# Patient Record
Sex: Female | Born: 1975 | Race: White | Hispanic: Yes | Marital: Married | Smoking: Former smoker
Health system: Southern US, Community
[De-identification: ages and names within clinical notes are randomized; demographics above are authoritative.]

## PROBLEM LIST (undated history)

## (undated) DIAGNOSIS — D649 Anemia, unspecified: Secondary | ICD-10-CM

## (undated) DIAGNOSIS — I10 Essential (primary) hypertension: Secondary | ICD-10-CM

## (undated) DIAGNOSIS — E119 Type 2 diabetes mellitus without complications: Secondary | ICD-10-CM

## (undated) DIAGNOSIS — D126 Benign neoplasm of colon, unspecified: Secondary | ICD-10-CM

## (undated) DIAGNOSIS — E785 Hyperlipidemia, unspecified: Secondary | ICD-10-CM

## (undated) DIAGNOSIS — D151 Benign neoplasm of heart: Secondary | ICD-10-CM

## (undated) DIAGNOSIS — I2510 Atherosclerotic heart disease of native coronary artery without angina pectoris: Secondary | ICD-10-CM

## (undated) DIAGNOSIS — Z5189 Encounter for other specified aftercare: Secondary | ICD-10-CM

## (undated) DIAGNOSIS — E876 Hypokalemia: Secondary | ICD-10-CM

## (undated) HISTORY — DX: Hyperlipidemia, unspecified: E78.5

## (undated) HISTORY — DX: Encounter for other specified aftercare: Z51.89

## (undated) HISTORY — DX: Benign neoplasm of colon, unspecified: D12.6

## (undated) HISTORY — PX: OTHER SURGICAL HISTORY: SHX169

## (undated) HISTORY — DX: Hypokalemia: E87.6

## (undated) NOTE — *Deleted (*Deleted)
Thank you for coming to see me today. It was a pleasure.     Please follow-up with *** in ***  If you have any questions or concerns, please do not hesitate to call the office at [phone].  Best,   Correen Bubolz, MD Family Medicine Residency    

---

## 2003-09-12 ENCOUNTER — Emergency Department (HOSPITAL_COMMUNITY): Admission: EM | Admit: 2003-09-12 | Discharge: 2003-09-12 | Payer: Self-pay | Admitting: *Deleted

## 2003-09-12 ENCOUNTER — Encounter: Payer: Self-pay | Admitting: *Deleted

## 2004-04-16 ENCOUNTER — Emergency Department (HOSPITAL_COMMUNITY): Admission: EM | Admit: 2004-04-16 | Discharge: 2004-04-16 | Payer: Self-pay | Admitting: Emergency Medicine

## 2004-05-09 ENCOUNTER — Emergency Department (HOSPITAL_COMMUNITY): Admission: EM | Admit: 2004-05-09 | Discharge: 2004-05-09 | Payer: Self-pay | Admitting: *Deleted

## 2005-08-29 ENCOUNTER — Ambulatory Visit: Payer: Self-pay | Admitting: *Deleted

## 2005-08-29 ENCOUNTER — Ambulatory Visit (HOSPITAL_COMMUNITY): Admission: RE | Admit: 2005-08-29 | Discharge: 2005-08-29 | Payer: Self-pay | Admitting: *Deleted

## 2005-09-05 ENCOUNTER — Ambulatory Visit: Payer: Self-pay | Admitting: *Deleted

## 2005-09-05 ENCOUNTER — Ambulatory Visit (HOSPITAL_COMMUNITY): Admission: RE | Admit: 2005-09-05 | Discharge: 2005-09-05 | Payer: Self-pay | Admitting: Obstetrics & Gynecology

## 2005-09-12 ENCOUNTER — Ambulatory Visit: Payer: Self-pay | Admitting: *Deleted

## 2005-09-19 ENCOUNTER — Ambulatory Visit: Payer: Self-pay | Admitting: *Deleted

## 2005-09-26 ENCOUNTER — Inpatient Hospital Stay (HOSPITAL_COMMUNITY): Admission: AD | Admit: 2005-09-26 | Discharge: 2005-09-29 | Payer: Self-pay | Admitting: *Deleted

## 2005-09-26 ENCOUNTER — Ambulatory Visit: Payer: Self-pay | Admitting: *Deleted

## 2005-09-26 ENCOUNTER — Ambulatory Visit: Payer: Self-pay | Admitting: Family Medicine

## 2007-11-13 ENCOUNTER — Emergency Department (HOSPITAL_COMMUNITY): Admission: EM | Admit: 2007-11-13 | Discharge: 2007-11-13 | Payer: Self-pay | Admitting: Emergency Medicine

## 2007-11-14 ENCOUNTER — Encounter: Payer: Self-pay | Admitting: Obstetrics & Gynecology

## 2007-11-14 ENCOUNTER — Other Ambulatory Visit: Payer: Self-pay | Admitting: Emergency Medicine

## 2007-11-14 ENCOUNTER — Inpatient Hospital Stay (HOSPITAL_COMMUNITY): Admission: AD | Admit: 2007-11-14 | Discharge: 2007-11-15 | Payer: Self-pay | Admitting: Obstetrics & Gynecology

## 2007-11-14 ENCOUNTER — Ambulatory Visit: Payer: Self-pay | Admitting: Obstetrics & Gynecology

## 2007-12-02 ENCOUNTER — Encounter (INDEPENDENT_AMBULATORY_CARE_PROVIDER_SITE_OTHER): Payer: Self-pay | Admitting: *Deleted

## 2009-10-08 ENCOUNTER — Inpatient Hospital Stay (HOSPITAL_COMMUNITY): Admission: AD | Admit: 2009-10-08 | Discharge: 2009-10-08 | Payer: Self-pay | Admitting: Obstetrics & Gynecology

## 2009-12-18 HISTORY — PX: TUBAL LIGATION: SHX77

## 2010-03-27 ENCOUNTER — Inpatient Hospital Stay (HOSPITAL_COMMUNITY): Admission: AD | Admit: 2010-03-27 | Discharge: 2010-03-28 | Payer: Self-pay | Admitting: Obstetrics

## 2010-04-05 ENCOUNTER — Encounter (INDEPENDENT_AMBULATORY_CARE_PROVIDER_SITE_OTHER): Payer: Self-pay | Admitting: Obstetrics

## 2010-04-05 ENCOUNTER — Inpatient Hospital Stay (HOSPITAL_COMMUNITY)
Admission: RE | Admit: 2010-04-05 | Discharge: 2010-04-08 | Payer: Self-pay | Source: Ambulatory Visit | Admitting: Obstetrics

## 2010-05-02 ENCOUNTER — Encounter: Payer: Self-pay | Admitting: *Deleted

## 2011-01-17 NOTE — Assessment & Plan Note (Signed)
Summary: New pt/DNKA               ]

## 2011-01-17 NOTE — Miscellaneous (Signed)
Summary: wanted appt  Clinical Lists Changes had a c section & has an indfected stitch. wanted to be seen here. (interpretor used) told her to call surgeon who did the C section. may use [facility] for a provider. she is not a pt here. [facility] her NP appt in 2008. have not heard from her until now.[place] RN  May 02, 2010 2:12 PM

## 2011-03-07 LAB — CBC
HCT: 32.5 % — ABNORMAL LOW (ref 36.0–46.0)
HCT: 43.6 % (ref 36.0–46.0)
Hemoglobin: 10.7 g/dL — ABNORMAL LOW (ref 12.0–15.0)
Hemoglobin: 14.3 g/dL (ref 12.0–15.0)
MCHC: 32.7 g/dL (ref 30.0–36.0)
MCHC: 33.1 g/dL (ref 30.0–36.0)
MCV: 86.8 fL (ref 78.0–100.0)
MCV: 87.1 fL (ref 78.0–100.0)
Platelets: 162 10*3/uL (ref 150–400)
Platelets: 186 10*3/uL (ref 150–400)
RBC: 3.72 MIL/uL — ABNORMAL LOW (ref 3.87–5.11)
RBC: 5.03 MIL/uL (ref 3.87–5.11)
RDW: 16.7 % — ABNORMAL HIGH (ref 11.5–15.5)
RDW: 16.8 % — ABNORMAL HIGH (ref 11.5–15.5)
WBC: 12.7 10*3/uL — ABNORMAL HIGH (ref 4.0–10.5)
WBC: 9.7 10*3/uL (ref 4.0–10.5)

## 2011-03-07 LAB — RPR: RPR Ser Ql: NONREACTIVE

## 2011-05-02 NOTE — Discharge Summary (Signed)
NAMEFreddi Diaz NO.:  [id number]   MEDICAL RECORD NO.:  [id number]          PATIENT TYPE:  INP   LOCATION:  9140                          [facility]ILITY[facility]  [facility]   PHYSICIAN:  Lesly Dukes, M.D. DATE OF BIRTH:  1975-12-21   DATE OF ADMISSION:  11/14/2007  DATE OF DISCHARGE:                               DISCHARGE SUMMARY   REASON FOR ADMISSION:  Pregnancy at 7 weeks with inevitable abortion  with hemorrhage.   DISCHARGE DIAGNOSES:  Status post dilatation and curettage for  inevitable abortion with hemorrhage.   HOSPITAL COURSE:  The patient required 2 units of blood for low  hemoglobin secondary to blood loss, responded well to that.  Blood  pressures are stable and those are as follows post transfusion:  126/78,  133/85, pulses ranged from 80-90.   PHYSICAL EXAM:  HEART:  Is regular to rhythm and rate.  LUNGS:  Are clear to auscultation bilaterally.  ABDOMEN:  Is soft, flat, tender with palpation.   ASSESSMENT:  Stable postoperative day #1, status post inevitable  abortion with hemorrhage, anemia secondary to blood loss.   PLAN:  We are going to discharge her home.  [facility]elda [facility] in 2 weeks.   DISCHARGE MEDS:  1. Cipro 500 b.i.d. for 7 days.  2. Darvocet-N 100, 1 p.o. q.4 h. p.r.n. cramping.   FOLLOWUP:  She is to notify us or Dr. Emelda Fear of any further  complications.      Zerita Boers, N.M.      Lesly Dukes, M.D.  Electronically Signed    DL/MEDQ  D:  13/07/6577  T:  11/15/2007  Job:  [id number]   cc:   [facility]

## 2011-05-02 NOTE — Op Note (Signed)
NAMEFreddi Diaz NO.:  [id number]   MEDICAL RECORD NO.:  [id number]          PATIENT TYPE:  INP   LOCATION:  9301                          [facility]   PHYSICIAN:  Norton Blizzard, MD    DATE OF BIRTH:  Sep 02, 1976   DATE OF PROCEDURE:  11/14/2007  DATE OF DISCHARGE:                               OPERATIVE REPORT   PREOPERATIVE DIAGNOSIS:  Seven week fetal demise, incomplete septic  abortion.   POSTOPERATIVE DIAGNOSIS:  Seven week fetal demise, incomplete septic  abortion.   PROCEDURE:  Suction dilatation and curettage.   SURGEON:  Johnella Moloney, M.D.   ANESTHESIA:  Spinal and paracervical block with 0.5% Marcaine.   INTRAVENOUS FLUIDS:  600 cc of LR.   URINE OUTPUT:  50 cc.   ESTIMATED BLOOD LOSS:  100 cc.   INDICATIONS:  The patient is a 35 year old, G5 P4, with newly-diagnosed  fetal demise at seven weeks gestation.  She was seen at [facility] on  November 13, 2007 for bleeding; she was diagnosed with the fetal demise  and given that patient was stable, she opted for expectant management.  The patient returned to the emergency room with heavy bleeding today  and, given the concern for incomplete abortion, she was [facility] evaluation.  Upon arrival at  [facility], the patient was noted to have severe bleeding and also  passing clots.  She was also noted to have a temperature of 100.6, blood  pressure of 120/80, and a pulse in the 90s.  The patient also was noted  to have a syncopal episode.  A pelvic exam on admission revealed  products of conception at the external os, which were removed using ring  forceps, and the specimen was sent to pathology.  After this, the  patient continued to have brisk bleeding.  The decision was made to  proceed with suction D & C, and written informed consent was obtained  with the aid of a Spanish interpreter.   FINDINGS:  Eight-week size anteverted  uterus, moderate amount of  products of conception, open cervical os to 1cm dilatation.   SPECIMENS:  Products of conception sent to pathology.   DESCRIPTION OF PROCEDURE:  The patient was taken to the operating room  where spinal anesthesia was placed without difficulty. Zosyn 4.5 g was  also administered intravenously given patient's fever.  She was placed  in the dorsal lithotomy position and prepped and draped in a sterile  manner.  A sterile speculum was placed in the patient's vagina, and the  cervix was noted to be about 1 cm dilated.  A single-toothed tenaculum  was placed on the anterior lip of the cervix and a paracervical block  was then administered using 20 cc of 0.5% Marcaine.  An 8 mm suction  curette was then advanced gently to the uterine fundus.  The suction  device was activated and the curette was rotated to clear the uterus of  all the products of conception.  A sharp curettage was then performed  until a gritty  texture was noted.  The  suction curette was then reintroduced to clear  the uterus of all remaining products of conception.  There was minimal  bleeding noted and the tenaculum was then removed with good hemostasis  noted.  The patient tolerated the procedure well.  She was taken to the  recovery room in stable condition.      Norton Blizzard, MD  Electronically Signed     UAD/MEDQ  D:  11/14/2007  T:  11/14/2007  Job:  [id number]

## 2011-06-17 ENCOUNTER — Emergency Department (HOSPITAL_COMMUNITY): Payer: Self-pay

## 2011-06-17 ENCOUNTER — Inpatient Hospital Stay (INDEPENDENT_AMBULATORY_CARE_PROVIDER_SITE_OTHER)
Admission: RE | Admit: 2011-06-17 | Discharge: 2011-06-17 | Disposition: A | Discharge status: Home / Self Care | Payer: Self-pay | Source: Ambulatory Visit | Attending: Family Medicine | Admitting: Family Medicine

## 2011-06-17 ENCOUNTER — Emergency Department (HOSPITAL_COMMUNITY)
Admission: EM | Admit: 2011-06-17 | Discharge: 2011-06-17 | Disposition: A | Discharge status: Home / Self Care | Payer: Self-pay | Attending: Emergency Medicine | Admitting: Emergency Medicine

## 2011-06-17 DIAGNOSIS — R0602 Shortness of breath: Secondary | ICD-10-CM | POA: Insufficient documentation

## 2011-06-17 DIAGNOSIS — R0609 Other forms of dyspnea: Secondary | ICD-10-CM | POA: Insufficient documentation

## 2011-06-17 DIAGNOSIS — R0989 Other specified symptoms and signs involving the circulatory and respiratory systems: Secondary | ICD-10-CM | POA: Insufficient documentation

## 2011-06-17 DIAGNOSIS — R51 Headache: Secondary | ICD-10-CM | POA: Insufficient documentation

## 2011-06-17 DIAGNOSIS — Z79899 Other long term (current) drug therapy: Secondary | ICD-10-CM | POA: Insufficient documentation

## 2011-06-17 DIAGNOSIS — R079 Chest pain, unspecified: Secondary | ICD-10-CM | POA: Insufficient documentation

## 2011-06-17 DIAGNOSIS — Z760 Encounter for issue of repeat prescription: Secondary | ICD-10-CM | POA: Insufficient documentation

## 2011-06-17 DIAGNOSIS — I10 Essential (primary) hypertension: Secondary | ICD-10-CM | POA: Insufficient documentation

## 2011-06-17 LAB — BASIC METABOLIC PANEL
BUN: 12 mg/dL (ref 6–23)
CO2: 24 mEq/L (ref 19–32)
Calcium: 8.7 mg/dL (ref 8.4–10.5)
Chloride: 105 mEq/L (ref 96–112)
Creatinine, Ser: 0.58 mg/dL (ref 0.50–1.10)
GFR calc Af Amer: >60 mL/min (ref >60)
GFR calc non Af Amer: >60 mL/min (ref >60)
Glucose, Bld: 106 mg/dL — ABNORMAL HIGH (ref 70–99)
Potassium: 3.6 mEq/L (ref 3.5–5.1)
Sodium: 138 mEq/L (ref 135–145)

## 2011-06-17 LAB — CK TOTAL AND CKMB (NOT AT ARMC)
CK, MB: 2.1 ng/mL (ref 0.3–4.0)
CK, MB: 2 ng/mL (ref 0.3–4.0)
CK, MB: 1.8 ng/mL (ref 0.3–4.0)
Relative Index: 2 (ref 0.0–2.5)
Relative Index: INVALID (ref 0.0–2.5)
Relative Index: INVALID (ref 0.0–2.5)
Total CK: 103 U/L (ref 7–177)
Total CK: 93 U/L (ref 7–177)
Total CK: 88 U/L (ref 7–177)

## 2011-06-17 LAB — URINALYSIS, ROUTINE W REFLEX MICROSCOPIC
Bilirubin Urine: NEGATIVE
Glucose, UA: NEGATIVE mg/dL
Ketones, ur: NEGATIVE mg/dL
Nitrite: NEGATIVE
Protein, ur: NEGATIVE mg/dL
Specific Gravity, Urine: 1.009 (ref 1.005–1.030)
Urobilinogen, UA: 0.2 mg/dL (ref 0.0–1.0)
pH: 7.5 (ref 5.0–8.0)

## 2011-06-17 LAB — CBC
HCT: 40.3 % (ref 36.0–46.0)
Hemoglobin: 13.4 g/dL (ref 12.0–15.0)
MCH: 27.7 pg (ref 26.0–34.0)
MCHC: 33.3 g/dL (ref 30.0–36.0)
MCV: 83.4 fL (ref 78.0–100.0)
Platelets: 225 10*3/uL (ref 150–400)
RBC: 4.83 MIL/uL (ref 3.87–5.11)
RDW: 13.9 % (ref 11.5–15.5)
WBC: 12.5 10*3/uL — ABNORMAL HIGH (ref 4.0–10.5)

## 2011-06-17 LAB — DIFFERENTIAL
Basophils Absolute: 0 10*3/uL (ref 0.0–0.1)
Basophils Relative: 0 % (ref 0–1)
Eosinophils Absolute: 0.1 10*3/uL (ref 0.0–0.7)
Eosinophils Relative: 1 % (ref 0–5)
Lymphocytes Relative: 20 % (ref 12–46)
Lymphs Abs: 2.5 10*3/uL (ref 0.7–4.0)
Monocytes Absolute: 0.6 10*3/uL (ref 0.1–1.0)
Monocytes Relative: 5 % (ref 3–12)
Neutro Abs: 9.4 10*3/uL — ABNORMAL HIGH (ref 1.7–7.7)
Neutrophils Relative %: 75 % (ref 43–77)

## 2011-06-17 LAB — URINE MICROSCOPIC-ADD ON

## 2011-06-17 LAB — TROPONIN I
Troponin I: <0.3 ng/mL (ref <0.30)
Troponin I: <0.3 ng/mL (ref <0.30)
Troponin I: <0.3 ng/mL (ref <0.30)

## 2011-06-17 LAB — POCT PREGNANCY, URINE: Preg Test, Ur: NEGATIVE

## 2011-09-26 LAB — ABO/RH
ABO/RH(D): O POS
ABO/RH(D): O POS

## 2011-09-26 LAB — DIFFERENTIAL
Basophils Absolute: 0
Basophils Absolute: 0.1
Basophils Relative: 0
Basophils Relative: 1
Eosinophils Absolute: 0 — ABNORMAL LOW
Eosinophils Absolute: 0.2
Eosinophils Relative: 0
Eosinophils Relative: 1
Lymphocytes Relative: 12
Lymphocytes Relative: 27
Lymphs Abs: 1.5
Lymphs Abs: 3.5
Monocytes Absolute: 0.4
Monocytes Absolute: 0.6
Monocytes Relative: 3
Monocytes Relative: 5
Neutro Abs: 11.2 — ABNORMAL HIGH
Neutro Abs: 8.8 — ABNORMAL HIGH
Neutrophils Relative %: 67
Neutrophils Relative %: 85 — ABNORMAL HIGH

## 2011-09-26 LAB — CBC
HCT: 24.8 — ABNORMAL LOW
HCT: 29.3 — ABNORMAL LOW
HCT: 34.1 — ABNORMAL LOW
HCT: 41.8
Hemoglobin: 10 — ABNORMAL LOW
Hemoglobin: 11.8 — ABNORMAL LOW
Hemoglobin: 14.1
Hemoglobin: 8.4 — ABNORMAL LOW
MCHC: 33.7
MCHC: 33.9
MCHC: 34.2
MCHC: 34.6
MCV: 87.1
MCV: 88.1
MCV: 88.9
MCV: 89
Platelets: 205
Platelets: 252
Platelets: 277
Platelets: 281
RBC: 2.79 — ABNORMAL LOW
RBC: 3.29 — ABNORMAL LOW
RBC: 3.87
RBC: 4.79
RDW: 13.3
RDW: 13.3
RDW: 13.5
RDW: 13.6
WBC: 11.9 — ABNORMAL HIGH
WBC: 13.1 — ABNORMAL HIGH
WBC: 13.2 — ABNORMAL HIGH
WBC: 18.9 — ABNORMAL HIGH

## 2011-09-26 LAB — TYPE AND SCREEN
ABO/RH(D): O POS
Antibody Screen: NEGATIVE

## 2011-09-26 LAB — APTT: aPTT: 34

## 2011-09-26 LAB — FIBRINOGEN: Fibrinogen: 394

## 2011-09-26 LAB — PREPARE RBC (CROSSMATCH)

## 2011-09-26 LAB — PROTIME-INR
INR: 1.1
Prothrombin Time: 13.9

## 2013-11-20 ENCOUNTER — Encounter (HOSPITAL_COMMUNITY): Payer: Self-pay | Admitting: Emergency Medicine

## 2013-11-20 ENCOUNTER — Emergency Department (INDEPENDENT_AMBULATORY_CARE_PROVIDER_SITE_OTHER): Payer: Self-pay

## 2013-11-20 ENCOUNTER — Emergency Department (INDEPENDENT_AMBULATORY_CARE_PROVIDER_SITE_OTHER)
Admission: EM | Admit: 2013-11-20 | Discharge: 2013-11-20 | Disposition: A | Discharge status: Home / Self Care | Payer: Self-pay | Source: Home / Self Care

## 2013-11-20 DIAGNOSIS — J069 Acute upper respiratory infection, unspecified: Secondary | ICD-10-CM

## 2013-11-20 HISTORY — DX: Essential (primary) hypertension: I10

## 2013-11-20 MED ORDER — PHENYLEPH-CHLORPHEN-CODEINE 5-2-10 MG/5ML PO SYRP: 2.0000 mL | ORAL_SOLUTION | Freq: Three times a day (TID) | ORAL | Status: DC | PRN

## 2013-11-20 NOTE — ED Provider Notes (Signed)
CSN: [id number]     Arrival date & time 11/20/13  2952 History   None    Chief Complaint  Patient presents with  . Cough   (Consider location/radiation/quality/duration/timing/severity/associated sxs/prior Treatment) HPI  Cough - 2 weeks, worsening, accompanied by central chest pain when coughing, mild shortness of breath, sore muscles, headache and itching in her ears; 1 sick child at home; she denies fever; pt with history of high blood pressure  Past Medical History  Diagnosis Date  . Hypertension    Past Surgical History  Procedure Laterality Date  . Cesarean section  1999 and 2011    x 2  . Tubal ligation  2011   Family History  Problem Relation Age of Onset  . Hypertension Mother   . Diabetes Father    History  Substance Use Topics  . Smoking status: Never Smoker   . Smokeless tobacco: Not on file  . Alcohol Use: Yes     Comment: occasional   OB History   Grav Para Term Preterm Abortions TAB SAB Ect Mult Living                 Review of Systems Gen: middle aged female, ill appearing but not toxic, obese HEENT: NCAT, injected conjunctiva, normal tympanic membrane, mild tenderness to palpation of sinus, no OP exudate CV: RRR, no m/r/g, no JVD or carotid bruits; reproducible chest pain on palpation of sternum  Pulm: normal WOB, CTA-B   Allergies  Review of patient's allergies indicates no known allergies.  Home Medications   Current Outpatient Rx  Name  Route  Sig  Dispense  Refill  . guaifenesin (ROBITUSSIN) 100 MG/5ML syrup   Oral   Take 200 mg by mouth 3 (three) times daily as needed for cough.         Marland Kitchen lisinopril-hydrochlorothiazide (PRINZIDE,ZESTORETIC) 20-25 MG per tablet   Oral   Take 1 tablet by mouth daily.         Marland Kitchen Phenyleph-Chlorphen-Codeine 04-18-09 MG/5ML SYRP   Oral   Take 2 mLs by mouth every 8 (eight) hours as needed (Cough).   1 Bottle   0    BP 146/96  Pulse 70  Temp(Src) 98.9 F (37.2 C) (Oral)  Resp 24  SpO2 100%   LMP 10/19/2013 Physical Exam  ED Course  Procedures (including critical care time) Labs Review Labs Reviewed - No data to display Imaging Review Dg Chest 2 View  11/20/2013   CLINICAL DATA:  Cough, chest pain  EXAM: CHEST  2 VIEW  COMPARISON:  June 17, 2011  FINDINGS: The heart size and mediastinal contours are within normal limits. There is no focal infiltrate, pulmonary edema, or pleural effusion. The visualized skeletal structures are stable. There is mild scoliosis of spine.  IMPRESSION: No active cardiopulmonary disease.   Electronically Signed   By: Sherian Rein M.D.   On: 11/20/2013 21:00    Date: 11/20/2013  Rate: 68  Rhythm: normal sinus rhythm  QRS Axis: normal  Intervals: normal  ST/T Wave abnormalities: normal  Conduction Disutrbances:none  Narrative Interpretation: no evidence of ischemia or infarct  Old EKG Reviewed: none available      MDM   1. Viral URI with cough    No concern for pneumonia and chest pain due to costochondritis. Given rx for codeine cough syrup and expectant mgt.     Garnetta Buddy, MD 11/20/13 2153

## 2013-11-20 NOTE — ED Notes (Signed)
C/o coughing x 2 weeks with pain in L upper chest and hard to breathe.  Also c/o L earache.  Cough is non-productive.  When she coughs, she has a headache and coughs more at night.

## 2013-11-21 NOTE — ED Provider Notes (Signed)
Medical screening examination/treatment/procedure(s) were performed by resident physician or non-physician practitioner and as supervising physician I was immediately available for consultation/collaboration.   Riata Ikeda DOUGLAS MD.   Cielle Aguila D Tamara Kenyon, MD 11/21/13 1707 

## 2014-07-31 ENCOUNTER — Emergency Department (HOSPITAL_COMMUNITY): Payer: Self-pay

## 2014-07-31 ENCOUNTER — Emergency Department (HOSPITAL_COMMUNITY)
Admission: EM | Admit: 2014-07-31 | Discharge: 2014-07-31 | Disposition: A | Discharge status: Home / Self Care | Payer: Self-pay | Attending: Emergency Medicine | Admitting: Emergency Medicine

## 2014-07-31 ENCOUNTER — Encounter (HOSPITAL_COMMUNITY): Payer: Self-pay | Admitting: Emergency Medicine

## 2014-07-31 DIAGNOSIS — Z791 Long term (current) use of non-steroidal anti-inflammatories (NSAID): Secondary | ICD-10-CM | POA: Insufficient documentation

## 2014-07-31 DIAGNOSIS — Z79899 Other long term (current) drug therapy: Secondary | ICD-10-CM | POA: Insufficient documentation

## 2014-07-31 DIAGNOSIS — R079 Chest pain, unspecified: Secondary | ICD-10-CM | POA: Insufficient documentation

## 2014-07-31 DIAGNOSIS — I10 Essential (primary) hypertension: Secondary | ICD-10-CM | POA: Insufficient documentation

## 2014-07-31 DIAGNOSIS — R0789 Other chest pain: Secondary | ICD-10-CM

## 2014-07-31 DIAGNOSIS — Z3202 Encounter for pregnancy test, result negative: Secondary | ICD-10-CM | POA: Insufficient documentation

## 2014-07-31 DIAGNOSIS — R071 Chest pain on breathing: Secondary | ICD-10-CM | POA: Insufficient documentation

## 2014-07-31 LAB — CBC WITH DIFFERENTIAL/PLATELET
Basophils Absolute: 0 10*3/uL (ref 0.0–0.1)
Basophils Relative: 0 % (ref 0–1)
Eosinophils Absolute: 0.3 10*3/uL (ref 0.0–0.7)
Eosinophils Relative: 2 % (ref 0–5)
HCT: 39.9 % (ref 36.0–46.0)
Hemoglobin: 12.7 g/dL (ref 12.0–15.0)
Lymphocytes Relative: 39 % (ref 12–46)
Lymphs Abs: 4.8 10*3/uL — ABNORMAL HIGH (ref 0.7–4.0)
MCH: 25.5 pg — ABNORMAL LOW (ref 26.0–34.0)
MCHC: 31.8 g/dL (ref 30.0–36.0)
MCV: 80 fL (ref 78.0–100.0)
Monocytes Absolute: 0.7 10*3/uL (ref 0.1–1.0)
Monocytes Relative: 6 % (ref 3–12)
Neutro Abs: 6.5 10*3/uL (ref 1.7–7.7)
Neutrophils Relative %: 53 % (ref 43–77)
Platelets: 300 10*3/uL (ref 150–400)
RBC: 4.99 MIL/uL (ref 3.87–5.11)
RDW: 15.5 % (ref 11.5–15.5)
WBC: 12.4 10*3/uL — ABNORMAL HIGH (ref 4.0–10.5)

## 2014-07-31 LAB — TROPONIN I: Troponin I: <0.3 ng/mL (ref <0.30)

## 2014-07-31 LAB — BASIC METABOLIC PANEL
Anion gap: 14 (ref 5–15)
BUN: 13 mg/dL (ref 6–23)
CO2: 25 mEq/L (ref 19–32)
Calcium: 9.5 mg/dL (ref 8.4–10.5)
Chloride: 99 mEq/L (ref 96–112)
Creatinine, Ser: 0.64 mg/dL (ref 0.50–1.10)
GFR calc Af Amer: >90 mL/min (ref >90)
GFR calc non Af Amer: >90 mL/min (ref >90)
Glucose, Bld: 120 mg/dL — ABNORMAL HIGH (ref 70–99)
Potassium: 4 mEq/L (ref 3.7–5.3)
Sodium: 138 mEq/L (ref 137–147)

## 2014-07-31 LAB — URINALYSIS, ROUTINE W REFLEX MICROSCOPIC
Bilirubin Urine: NEGATIVE
Glucose, UA: NEGATIVE mg/dL
Ketones, ur: NEGATIVE mg/dL
Nitrite: NEGATIVE
Protein, ur: NEGATIVE mg/dL
Specific Gravity, Urine: <1.005 — ABNORMAL LOW (ref 1.005–1.030)
Urobilinogen, UA: 0.2 mg/dL (ref 0.0–1.0)
pH: 6.5 (ref 5.0–8.0)

## 2014-07-31 LAB — URINE MICROSCOPIC-ADD ON

## 2014-07-31 LAB — D-DIMER, QUANTITATIVE (NOT AT ARMC): D-Dimer, Quant: 0.8 ug/mL-FEU — ABNORMAL HIGH (ref 0.00–0.48)

## 2014-07-31 LAB — PREGNANCY, URINE: Preg Test, Ur: NEGATIVE

## 2014-07-31 MED ORDER — IOHEXOL 350 MG/ML SOLN
100.0000 mL | Freq: Once | INTRAVENOUS | Status: AC | PRN
  Administered 2014-07-31: 100 mL via INTRAVENOUS

## 2014-07-31 MED ORDER — HYDROCODONE-ACETAMINOPHEN 5-325 MG PO TABS: ORAL_TABLET | ORAL | Status: DC

## 2014-07-31 MED ORDER — IBUPROFEN 400 MG PO TABS
400.0000 mg | ORAL_TABLET | Freq: Once | ORAL | Status: AC
  Administered 2014-07-31: 400 mg via ORAL
  Filled 2014-07-31: qty 1

## 2014-07-31 MED ORDER — HYDROCODONE-ACETAMINOPHEN 5-325 MG PO TABS
1.0000 | ORAL_TABLET | Freq: Once | ORAL | Status: AC
Start: 2014-07-31 — End: 2014-07-31
  Administered 2014-07-31: 1 via ORAL
  Filled 2014-07-31: qty 1

## 2014-07-31 MED ORDER — METHOCARBAMOL 500 MG PO TABS: 1000.0000 mg | ORAL_TABLET | Freq: Four times a day (QID) | ORAL | Status: DC | PRN

## 2014-07-31 NOTE — Discharge Instructions (Signed)
°Emergency Department Resource Guide °1) Find a Doctor and Pay Out of Pocket °Although you won't have to find out who is covered by your insurance plan, it is a good idea to ask around and get recommendations. You will then need to call the office and see if the doctor you have chosen will accept you as a new patient and what types of options they offer for patients who are self-pay. Some doctors offer discounts or will set up payment plans for their patients who do not have insurance, but you will need to ask so you aren't surprised when you get to your appointment. ° °2) Contact Your Local Health Department °Not all health departments have doctors that can see patients for sick visits, but many do, so it is worth a call to see if yours does. If you don't know where your local health department is, you can check in your phone book. The CDC also has a tool to help you locate your state's health department, and many state websites also have listings of all of their local health departments. ° °3) Find a Walk-in Clinic °If your illness is not likely to be very severe or complicated, you may want to try a walk in clinic. These are popping up all over the country in pharmacies, drugstores, and shopping centers. They're usually staffed by nurse practitioners or physician assistants that have been trained to treat common illnesses and complaints. They're usually fairly quick and inexpensive. However, if you have serious medical issues or chronic medical problems, these are probably not your best option. ° °No Primary Care Doctor: °- Call [facility] at  [phone] - they can help you locate a primary care doctor that  accepts your insurance, provides certain services, etc. °- Physician Referral Service- 1-800-533-3463 ° °Chronic Pain Problems: °Organization         Address  Phone   Notes  °[facility]  (336) 297-2271 Patients need to be referred by their primary care doctor.  ° °Medication  Assistance: °Organization         Address  Phone   Notes  °[place] [facility] [address] °[place], [place] [address] (336) 641-8030 --Must be a resident of [place] °-- Must have NO insurance coverage whatsoever (no Medicaid/ Medicare, etc.) °-- The pt. MUST have a primary care doctor that directs their care regularly and follows them in the community °  °[facility]  (866) 331-1348   °[employer]  (888) 892-1162   ° °Agencies that provide inexpensive medical care: °Organization         Address  Phone   Notes  °[facility]  (336) 832-8035   °[facility]    (336) 832-7272   °[facility] [address] °[place], [place] [address] (336) 832-4777   °[facility] [address], °[place] (336) 271-4999   °[facility]    (336) 373-0678   °[facility]    (336) 272-1050   °[facility] ° [address], [place] Phone:  (336) 832-4444, Fax:  (336) 832-4440 Hours of Operation:  9 am - 6 pm, M-F.  Also accepts Medicaid/Medicare and self-pay.  °[facility] ° [address], [place] Phone: (336) 832-3150, Fax: (336) [phone]. Hours of Operation:  8:30 am - 5:30 pm, M-F.  Also accepts Medicaid and self-pay.  °[facility] [place] [address], [place] Phone: (336) 878-6027   °[facility] [address], [place], [place] (336)723-1848, Ext. 123 Mondays & Thursdays: 7-9 AM.  First 15 patients are seen on a first come, first serve basis. °  ° °Medicaid-accepting [place] Providers: ° °Organization         Address  Phone   Notes  °[facility] [address], [place] (336) 641-2100 Also accepts self-pay patients.  °[facility] [address], [place] ° (336) 856-9996   °[facility] [address], [place]  (336) 288-8857   °[facility] [address] [place] [address], [place] (336) 299-7000   °Veita Bland [address], [place]  ° (336) 373-1557 Only accepts [place] Access Medicaid patients after they have their name applied to their card.  ° °Self-Pay (no insurance) in [place]: ° °Organization         Address  Phone   Notes  °Sickle Cell Patients, [facility] [address], [place] (336) 832-1970   °[facility] [address], [place] (336) 832-4400   °[facility] ° [address], [place] (336) 992-4800   °[facility]/Dr. Osei-Bonsu ° [address] [place] [address], [place] or [address], [place] (336) 841-8500 Phone number for both [place] and [place] locations is the same.  °[facility] [address], [place] (336) 299-0000   °[facility] [address] [place] [address], [place] or [address] (336) 852-7530 °(336) 878-2260   °[facility] [address], [place] (336) 350-1642, phone; (336) 294-5005, fax Sees patients 1st and 3rd Saturday of every month.  Must not qualify for public or private insurance (i.e. Medicaid, Medicare, [place] Health Choice, Veterans' Benefits) • Household income should be no more than 200% of the poverty level •The clinic cannot treat you if you are pregnant or think you are pregnant • Sexually transmitted diseases are not treated at the clinic.  ° ° °Dental Care: °Organization         Address  Phone  Notes  °[place] [facility] [address],  (336) 641-6152 Accepts children up to age 21 who are enrolled in Medicaid or [place] Health Choice; pregnant women with a Medicaid card; and children who have applied for Medicaid or [place] Health Choice, but were declined, whose parents can pay a reduced fee at time of service.  °[place]  [facility] [place]  [address], [place] (336) 641-7733 Accepts children up to age 21 who are enrolled in Medicaid or [place] Health Choice; pregnant women with a Medicaid card; and children who have applied for Medicaid or [place] Health Choice, but were declined, whose parents can pay a reduced fee at time of service.  °[facility] ° [address],  (336) 641-4533 Patients are seen by appointment only. Walk-ins are not accepted. [facility] will see patients 18 years of age and older. °Monday - Tuesday (8am-5pm) °Most Wednesdays (8:30-5pm) °$30 per visit, cash only  °[facility] ° [address], [place] (336) 641-4533 Patients are seen by appointment only. Walk-ins are not accepted. [facility] will see patients 18 years of age and older. °One   Wednesday Evening (Monthly: Volunteer Based).  $30 per visit, cash only  °[facility]  (919) 537-3737 for adults; Children under age 4, call Graduate Pediatric Dentistry at (919) [phone]. Children aged 4-14, please call (919) [phone] to request a pediatric application. ° Dental services are provided in all areas of dental care including fillings, crowns and bridges, complete and partial dentures, implants, gum treatment, root canals, and extractions. Preventive care is also provided. Treatment is provided to both adults and children. °Patients are selected via a lottery and there is often a waiting list. °  °[facility] [address], °[place] ° (336) 763-8833 [email or web address] °  °[facility] [address], [place], [place] (336)723-1848, Ext. 123 Second and Fourth Thursday of each month, opens at 6:30 AM; Clinic ends at 9 AM.  Patients are seen on a first-come first-served basis, and a limited number are seen during each clinic.  ° °[facility] ° [address], [place], [place] (336) 723-7904    Eligibility Requirements °You must have lived in [place], [place], or [place] counties for at least the last three months. °  You cannot be eligible for state or federal sponsored healthcare insurance, including Veterans Administration, Medicaid, or Medicare. °  You generally cannot be eligible for healthcare insurance through your employer.  °  How to apply: °Eligibility screenings are held every Tuesday and Wednesday afternoon from 1:00 pm until 4:00 pm. You do not need an appointment for the interview!  °[facility] [address], [place], [place] 336-631-2330   °[place] [facility]  336-342-8273   °[place]  336-703-3100   ° [facility]  336-570-6415   ° °Behavioral Health Resources in the Community: °Intensive Outpatient Programs °Organization         Address  Phone  Notes  °[facility] [address], [place], [place] 336-878-6098   °[facility] [address], [place], [place] 336-832-9800   °[facility] [address], [place], [place] ° 336-882-2125   °[facility] [address],  °[place], [place] 1-800-853-5163 or 336-641-4981   °Substance Abuse Resources °Organization         Address  Phone  Notes  °[facility]  336-882-2125   °[facility]  336-784-9470   °[facility]  336-285-9073   °[facility]  336-845-3988   °Residential & Outpatient Substance Abuse Program  1-800-659-3381   °Psychological Services °Organization         Address  Phone  Notes  °[facility]  336- 832-9600   °[facility]  336- 378-7881   °[facility] [address], [place] 1-800-853-5163 or 336-641-4981   ° °Mobile Crisis Teams °Organization         Address  Phone  Notes  °[facility], Mobile Crisis Care Unit  1-877-626-1772   °Assertive °[facility] ° [address]  [place], [place] 336-834-9664   °Sharon DeEsch [address] °[place] 336-554-5454   ° °Self-Help/Support Groups °Organization         Address  Phone             Notes  °[facility]  - variety of support groups  336- 373-1402 Call for more information  °Narcotics Anonymous (NA), [facility] [address], °[place]  2 meetings at this location  ° °  Residential Treatment Programs °Organization         Address  Phone  Notes  °[facility] [address],    °[place]  1-866-801-8205   °[facility] ° [address], [place], [place] 704-293-8524   °[facility] [address], [place] 336-845-3988 Admissions: 8am-3pm M-F  °[facility] [address]    °[place], [place] 336-841-1104   °[facility] [address], [place], [place] 336-379-7146   °[facility] [address]  °[place], [place] 336-285-9073   °[facility] Intensive Outpatient [address], [place], [place] 336-852-3033   °[facility] [address]  °[place], [place] 1-877-615-2722 or 336-784-9470   °[facility] [address] [place], [place] 336-227-7417 Accepts Medicaid  °[facility] [address]  °[place] 1-800-659-3381 Substance Abuse/Addiction Treatment  ° °[place] Behavioral Health Resources °Organization         Address  Phone  Notes  °[facility]  (888) 581-9988   °Julie Brannon, PhD [address] [place], [place]   (336) 349-5553 or (336) 951-0000   °[facility]   [address] °[place], [place] (336) 349-4454   °[facility] [address], [place], [place] (336) 342-8316 Insurance/Medicaid/sponsorship through [facility]  °[facility] [address]                                    [place], [place] (336) 342-8316 Therapy/tele-psych/case    °[facility] [address]  ° [place], [place] (336) 349-2233    °Dr. Arfeen  (336) 349-4544   °[facility] [place]  [employer] [place] [facility] 1) [address],  °2) [address], [place] °3)  [address], [place] (336) 349-3220 °(336) 342-7768 ° °(336) 342-8140   °[place] Child Abuse [facility] (336) 342-1394 or (336) 342-3537 (After Hours)    ° ° ° °Take the prescriptions as directed.  Apply moist heat or ice to the area(s) of discomfort, for 15 minutes at a time, several times per day for the next few days.  Do not fall asleep on a heating or ice pack.  Call your regular medical doctor on Monday to schedule a follow up appointment in the next 3 days.  Return to the Emergency Department immediately if worsening. ° °

## 2014-07-31 NOTE — ED Notes (Signed)
Pt states that 3 days ago she felt a tightening in her chest and passed out in her bathroom. Pt also had continued having chest pain yesterday and today. Today she is still having chest pressure and tightness denies feelings of passing out today.

## 2014-07-31 NOTE — ED Provider Notes (Signed)
CSN: [id number]     Arrival date & time 07/31/14  2116 History   First MD Initiated Contact with Patient 07/31/14 2130     Chief Complaint  Patient presents with  . Chest Pain     HPI Pt was seen at 2150. Per pt and her family, c/o gradual onset and persistence of constant mid-sternal chest "pain" for the past 3 days. Pt describes the CP as constant "tightness" and "tingling" in her mid-chest. States she felt "lightheaded" and "passed out" 3 days ago when the discomfort began. Pt has not taken any medications to treat her pain. Denies any change in her pain for the past 3 days. Denies any further episodes of syncope or lightheadedness. Denies palpitations, no SOB/cough, no back pain, no abd pain, no N/V/D, no fevers, no rash.    Past Medical History  Diagnosis Date  . Hypertension    Past Surgical History  Procedure Laterality Date  . Cesarean section  1999 and 2011    x 2  . Tubal ligation  2011   Family History  Problem Relation Age of Onset  . Hypertension Mother   . Diabetes Father    History  Substance Use Topics  . Smoking status: Never Smoker   . Smokeless tobacco: Not on file  . Alcohol Use: Yes     Comment: occasional    Review of Systems ROS: Statement: All systems negative except as marked or noted in the HPI; Constitutional: Negative for fever and chills. ; ; Eyes: Negative for eye pain, redness and discharge. ; ; ENMT: Negative for ear pain, hoarseness, nasal congestion, sinus pressure and sore throat. ; ; Cardiovascular: +CP. Negative for palpitations, diaphoresis, dyspnea and peripheral edema. ; ; Respiratory: Negative for cough, wheezing and stridor. ; ; Gastrointestinal: Negative for nausea, vomiting, diarrhea, abdominal pain, blood in stool, hematemesis, jaundice and rectal bleeding. . ; ; Genitourinary: Negative for dysuria, flank pain and hematuria. ; ; Musculoskeletal: Negative for back pain and neck pain. Negative for swelling and trauma.; ; Skin: Negative  for pruritus, rash, abrasions, blisters, bruising and skin lesion.; ; Neuro: Negative for headache, and neck stiffness. Negative for weakness, altered mental status, extremity weakness, paresthesias, involuntary movement, seizure and +lightheadedness, +syncope.      Allergies  Review of patient's allergies indicates no known allergies.  Home Medications   Prior to Admission medications   Medication Sig Start Date End Date Taking? Authorizing Provider  ibuprofen (ADVIL,MOTRIN) 200 MG tablet Take 800 mg by mouth once as needed for mild pain or moderate pain.   Yes Historical Provider, MD  lisinopril-hydrochlorothiazide (PRINZIDE,ZESTORETIC) 20-25 MG per tablet Take 1 tablet by mouth daily.   Yes Historical Provider, MD   BP 175/90  Pulse 74  Temp(Src) 99 F (37.2 C)  Resp 20  Ht 5' (1.524 m)  Wt 200 lb (90.719 kg)  BMI 39.06 kg/m2  SpO2 98%  LMP 07/19/2014 Physical Exam 2155: Physical examination:  Nursing notes reviewed; Vital signs and O2 SAT reviewed;  Constitutional: Well developed, Well nourished, Well hydrated, In no acute distress; Head:  Normocephalic, atraumatic; Eyes: EOMI, PERRL, No scleral icterus; ENMT: Mouth and pharynx normal, Mucous membranes moist; Neck: Supple, Full range of motion, No lymphadenopathy; Cardiovascular: Regular rate and rhythm, No murmur, rub, or gallop; Respiratory: Breath sounds clear & equal bilaterally, No rales, rhonchi, wheezes.  Speaking full sentences with ease, Normal respiratory effort/excursion; Chest: +bilat anterior chest wall tenderness to palp. No rash, no soft tissue crepitus, no deformity. Movement  normal; Abdomen: Soft, Nontender, Nondistended, Normal bowel sounds; Genitourinary: No CVA tenderness; Extremities: Pulses normal, No tenderness, No edema, No calf edema or asymmetry.; Neuro: AA&Ox3, Major CN grossly intact.  Speech clear. No gross focal motor or sensory deficits in extremities.; Skin: Color normal, Warm, Dry.   ED Course   Procedures     EKG Interpretation   Date/Time:  Friday July 31 2014 21:41:27 EDT Ventricular Rate:  64 PR Interval:  160 QRS Duration: 84 QT Interval:  389 QTC Calculation: 401 R Axis:   0 Text Interpretation:  Sinus rhythm Left ventricular hypertrophy When  compared with ECG of 11/20/2013 No significant change was found Confirmed  by [facility]  MD, Nunzio Cory [id number]) on 07/31/2014 10:37:07 PM      MDM  MDM Reviewed: previous chart, nursing note and vitals Reviewed previous: labs, ECG and x-ray Interpretation: labs, ECG, x-ray and CT scan    Results for orders placed during the hospital encounter of 07/31/14  CBC WITH DIFFERENTIAL      Result Value Ref Range   WBC 12.4 (*) 4.0 - 10.5 K/uL   RBC 4.99  3.87 - 5.11 MIL/uL   Hemoglobin 12.7  12.0 - 15.0 g/dL   HCT 39.9  36.0 - 46.0 %   MCV 80.0  78.0 - 100.0 fL   MCH 25.5 (*) 26.0 - 34.0 pg   MCHC 31.8  30.0 - 36.0 g/dL   RDW 15.5  11.5 - 15.5 %   Platelets 300  150 - 400 K/uL   Neutrophils Relative % 53  43 - 77 %   Neutro Abs 6.5  1.7 - 7.7 K/uL   Lymphocytes Relative 39  12 - 46 %   Lymphs Abs 4.8 (*) 0.7 - 4.0 K/uL   Monocytes Relative 6  3 - 12 %   Monocytes Absolute 0.7  0.1 - 1.0 K/uL   Eosinophils Relative 2  0 - 5 %   Eosinophils Absolute 0.3  0.0 - 0.7 K/uL   Basophils Relative 0  0 - 1 %   Basophils Absolute 0.0  0.0 - 0.1 K/uL  BASIC METABOLIC PANEL      Result Value Ref Range   Sodium 138  137 - 147 mEq/L   Potassium 4.0  3.7 - 5.3 mEq/L   Chloride 99  96 - 112 mEq/L   CO2 25  19 - 32 mEq/L   Glucose, Bld 120 (*) 70 - 99 mg/dL   BUN 13  6 - 23 mg/dL   Creatinine, Ser 0.64  0.50 - 1.10 mg/dL   Calcium 9.5  8.4 - 10.5 mg/dL   GFR calc non Af Amer >90  >90 mL/min   GFR calc Af Amer >90  >90 mL/min   Anion gap 14  5 - 15  PREGNANCY, URINE      Result Value Ref Range   Preg Test, Ur NEGATIVE  NEGATIVE  URINALYSIS, ROUTINE W REFLEX MICROSCOPIC      Result Value Ref Range   Color, Urine STRAW (*)  YELLOW   APPearance CLOUDY (*) CLEAR   Specific Gravity, Urine <1.005 (*) 1.005 - 1.030   pH 6.5  5.0 - 8.0   Glucose, UA NEGATIVE  NEGATIVE mg/dL   Hgb urine dipstick SMALL (*) NEGATIVE   Bilirubin Urine NEGATIVE  NEGATIVE   Ketones, ur NEGATIVE  NEGATIVE mg/dL   Protein, ur NEGATIVE  NEGATIVE mg/dL   Urobilinogen, UA 0.2  0.0 - 1.0 mg/dL   Nitrite NEGATIVE  NEGATIVE  Leukocytes, UA TRACE (*) NEGATIVE  TROPONIN I      Result Value Ref Range   Troponin I <0.30  <0.30 ng/mL  D-DIMER, QUANTITATIVE      Result Value Ref Range   D-Dimer, Quant 0.80 (*) 0.00 - 0.48 ug/mL-FEU  URINE MICROSCOPIC-ADD ON      Result Value Ref Range   Squamous Epithelial / LPF FEW (*) RARE   WBC, UA 7-10  <3 WBC/hpf   RBC / HPF 0-2  <3 RBC/hpf   Urine-Other FEW YEAST     Dg Chest 2 View 07/31/2014   CLINICAL DATA:  Mid chest pain and syncope for 3 days.  EXAM: CHEST  2 VIEW  COMPARISON:  Chest radiograph November 20, 2013  FINDINGS: Cardiomediastinal silhouette is unremarkable. Low inspiratory examination with crowded vascular markings. The lungs are clear without pleural effusions or focal consolidations. Trachea projects midline and there is no pneumothorax. Soft tissue planes and included osseous structures are non-suspicious.  IMPRESSION: No acute cardiopulmonary process.   Electronically Signed   By: Elon Alas   On: 07/31/2014 22:34   Ct Angio Chest Pe W/cm &/or Wo Cm 07/31/2014   CLINICAL DATA:  Headache, dizziness. Nausea, vomiting. Chest pain, syncope.  EXAM: CT ANGIOGRAPHY CHEST WITH CONTRAST  TECHNIQUE: Multidetector CT imaging of the chest was performed using the standard protocol during bolus administration of intravenous contrast. Multiplanar CT image reconstructions and MIPs were obtained to evaluate the vascular anatomy.  CONTRAST:  141mL OMNIPAQUE IOHEXOL 350 MG/ML SOLN  COMPARISON:  Chest x-ray performed today.  FINDINGS: No filling defects in the pulmonary arteries to suggest pulmonary  emboli. Heart is normal size. Aorta is normal caliber. No mediastinal, hilar, or axillary adenopathy. Chest wall soft tissues are unremarkable.  Lungs are clear. No focal airspace opacities or suspicious nodules. No effusions. Imaging into the upper abdomen shows no acute findings. No acute bony abnormality or focal bone lesion.  Review of the MIP images confirms the above findings.  IMPRESSION: No acute findings.  No evidence of pulmonary embolus.   Electronically Signed   By: Rolm Baptise M.D.   On: 07/31/2014 23:10    2315:  Doubt PE as cause for symptoms with negative CT-A chest.  Doubt ACS as cause for symptoms with normal troponin and unchanged EKG from previous after 3 days of constant symptoms. Pt is not orthostatic. Workup reassuring. Will tx symptomatically at this time. Pt feels better after meds and wants to go home now. Dx and testing d/w pt and family.  Questions answered.  Verb understanding, agreeable to d/c home with outpt f/u.    Francine Graven, DO 08/03/14 1528

## 2015-02-26 ENCOUNTER — Emergency Department (HOSPITAL_COMMUNITY): Payer: No Typology Code available for payment source

## 2015-02-26 ENCOUNTER — Encounter (HOSPITAL_COMMUNITY): Payer: Self-pay | Admitting: *Deleted

## 2015-02-26 ENCOUNTER — Inpatient Hospital Stay (HOSPITAL_COMMUNITY)
Admission: EM | Admit: 2015-02-26 | Discharge: 2015-03-09 | DRG: 229 | Disposition: A | Discharge status: Home / Self Care | Payer: No Typology Code available for payment source | Attending: Thoracic Surgery (Cardiothoracic Vascular Surgery) | Admitting: Thoracic Surgery (Cardiothoracic Vascular Surgery)

## 2015-02-26 DIAGNOSIS — D151 Benign neoplasm of heart: Principal | ICD-10-CM | POA: Diagnosis not present

## 2015-02-26 DIAGNOSIS — I10 Essential (primary) hypertension: Secondary | ICD-10-CM | POA: Diagnosis present

## 2015-02-26 DIAGNOSIS — R748 Abnormal levels of other serum enzymes: Secondary | ICD-10-CM | POA: Diagnosis present

## 2015-02-26 DIAGNOSIS — R222 Localized swelling, mass and lump, trunk: Secondary | ICD-10-CM | POA: Diagnosis not present

## 2015-02-26 DIAGNOSIS — R7989 Other specified abnormal findings of blood chemistry: Secondary | ICD-10-CM | POA: Diagnosis present

## 2015-02-26 DIAGNOSIS — I209 Angina pectoris, unspecified: Secondary | ICD-10-CM | POA: Diagnosis present

## 2015-02-26 DIAGNOSIS — J9811 Atelectasis: Secondary | ICD-10-CM | POA: Diagnosis not present

## 2015-02-26 DIAGNOSIS — I5189 Other ill-defined heart diseases: Secondary | ICD-10-CM

## 2015-02-26 DIAGNOSIS — D4989 Neoplasm of unspecified behavior of other specified sites: Secondary | ICD-10-CM | POA: Diagnosis present

## 2015-02-26 DIAGNOSIS — J90 Pleural effusion, not elsewhere classified: Secondary | ICD-10-CM

## 2015-02-26 DIAGNOSIS — D72829 Elevated white blood cell count, unspecified: Secondary | ICD-10-CM | POA: Diagnosis present

## 2015-02-26 DIAGNOSIS — K5900 Constipation, unspecified: Secondary | ICD-10-CM | POA: Diagnosis not present

## 2015-02-26 DIAGNOSIS — R519 Headache, unspecified: Secondary | ICD-10-CM | POA: Diagnosis present

## 2015-02-26 DIAGNOSIS — E119 Type 2 diabetes mellitus without complications: Secondary | ICD-10-CM | POA: Diagnosis present

## 2015-02-26 DIAGNOSIS — M79601 Pain in right arm: Secondary | ICD-10-CM | POA: Diagnosis not present

## 2015-02-26 DIAGNOSIS — R778 Other specified abnormalities of plasma proteins: Secondary | ICD-10-CM | POA: Diagnosis present

## 2015-02-26 DIAGNOSIS — Z833 Family history of diabetes mellitus: Secondary | ICD-10-CM

## 2015-02-26 DIAGNOSIS — R51 Headache: Secondary | ICD-10-CM

## 2015-02-26 DIAGNOSIS — I639 Cerebral infarction, unspecified: Secondary | ICD-10-CM

## 2015-02-26 DIAGNOSIS — R0789 Other chest pain: Secondary | ICD-10-CM

## 2015-02-26 DIAGNOSIS — Z8249 Family history of ischemic heart disease and other diseases of the circulatory system: Secondary | ICD-10-CM

## 2015-02-26 DIAGNOSIS — D62 Acute posthemorrhagic anemia: Secondary | ICD-10-CM | POA: Diagnosis not present

## 2015-02-26 DIAGNOSIS — R079 Chest pain, unspecified: Secondary | ICD-10-CM | POA: Diagnosis present

## 2015-02-26 DIAGNOSIS — D219 Benign neoplasm of connective and other soft tissue, unspecified: Secondary | ICD-10-CM

## 2015-02-26 HISTORY — DX: Type 2 diabetes mellitus without complications: E11.9

## 2015-02-26 HISTORY — DX: Other specified abnormalities of plasma proteins: R77.8

## 2015-02-26 HISTORY — DX: Benign neoplasm of heart: D15.1

## 2015-02-26 HISTORY — DX: Morbid (severe) obesity due to excess calories: E66.01

## 2015-02-26 HISTORY — DX: Other specified abnormal findings of blood chemistry: R79.89

## 2015-02-26 LAB — COMPREHENSIVE METABOLIC PANEL
ALT: 26 U/L (ref 0–35)
AST: 27 U/L (ref 0–37)
Albumin: 4.1 g/dL (ref 3.5–5.2)
Alkaline Phosphatase: 81 U/L (ref 39–117)
Anion gap: 10 (ref 5–15)
BUN: 13 mg/dL (ref 6–23)
CO2: 25 mmol/L (ref 19–32)
Calcium: 9.1 mg/dL (ref 8.4–10.5)
Chloride: 102 mmol/L (ref 96–112)
Creatinine, Ser: 0.65 mg/dL (ref 0.50–1.10)
GFR calc Af Amer: >90 mL/min (ref >90)
GFR calc non Af Amer: >90 mL/min (ref >90)
Glucose, Bld: 99 mg/dL (ref 70–99)
Potassium: 3.3 mmol/L — ABNORMAL LOW (ref 3.5–5.1)
Sodium: 137 mmol/L (ref 135–145)
Total Bilirubin: 0.3 mg/dL (ref 0.3–1.2)
Total Protein: 7.7 g/dL (ref 6.0–8.3)

## 2015-02-26 LAB — CBC WITH DIFFERENTIAL/PLATELET
Basophils Absolute: 0 10*3/uL (ref 0.0–0.1)
Basophils Relative: 0 % (ref 0–1)
Eosinophils Absolute: 0.1 10*3/uL (ref 0.0–0.7)
Eosinophils Relative: 1 % (ref 0–5)
HCT: 40.3 % (ref 36.0–46.0)
Hemoglobin: 12.6 g/dL (ref 12.0–15.0)
Lymphocytes Relative: 26 % (ref 12–46)
Lymphs Abs: 3.1 10*3/uL (ref 0.7–4.0)
MCH: 25.3 pg — ABNORMAL LOW (ref 26.0–34.0)
MCHC: 31.3 g/dL (ref 30.0–36.0)
MCV: 80.9 fL (ref 78.0–100.0)
Monocytes Absolute: 0.6 10*3/uL (ref 0.1–1.0)
Monocytes Relative: 5 % (ref 3–12)
Neutro Abs: 8.1 10*3/uL — ABNORMAL HIGH (ref 1.7–7.7)
Neutrophils Relative %: 68 % (ref 43–77)
Platelets: 334 10*3/uL (ref 150–400)
RBC: 4.98 MIL/uL (ref 3.87–5.11)
RDW: 15 % (ref 11.5–15.5)
WBC: 12 10*3/uL — ABNORMAL HIGH (ref 4.0–10.5)

## 2015-02-26 LAB — URINALYSIS, ROUTINE W REFLEX MICROSCOPIC
Bilirubin Urine: NEGATIVE
Glucose, UA: NEGATIVE mg/dL
Ketones, ur: NEGATIVE mg/dL
Leukocytes, UA: NEGATIVE
Nitrite: NEGATIVE
Protein, ur: NEGATIVE mg/dL
Specific Gravity, Urine: 1.005 (ref 1.005–1.030)
Urobilinogen, UA: 0.2 mg/dL (ref 0.0–1.0)
pH: 7 (ref 5.0–8.0)

## 2015-02-26 LAB — URINE MICROSCOPIC-ADD ON

## 2015-02-26 LAB — POC URINE PREG, ED: Preg Test, Ur: NEGATIVE

## 2015-02-26 LAB — TROPONIN I: Troponin I: 0.44 ng/mL — ABNORMAL HIGH (ref <0.031)

## 2015-02-26 LAB — CBG MONITORING, ED: Glucose-Capillary: 107 mg/dL — ABNORMAL HIGH (ref 70–99)

## 2015-02-26 MED ORDER — IOHEXOL 350 MG/ML SOLN
160.0000 mL | Freq: Once | INTRAVENOUS | Status: AC | PRN
  Administered 2015-02-26: 160 mL via INTRAVENOUS

## 2015-02-26 MED ORDER — SODIUM CHLORIDE 0.9 % IV SOLN
1000.0000 mL | INTRAVENOUS | Status: DC
  Administered 2015-02-26 – 2015-03-03 (×4): 1000 mL via INTRAVENOUS

## 2015-02-26 MED ORDER — SODIUM CHLORIDE 0.9 % IV SOLN
1000.0000 mL | Freq: Once | INTRAVENOUS | Status: AC
  Administered 2015-02-26: 1000 mL via INTRAVENOUS

## 2015-02-26 MED ORDER — ASPIRIN 325 MG PO TABS
325.0000 mg | ORAL_TABLET | Freq: Once | ORAL | Status: AC
  Administered 2015-02-26: 325 mg via ORAL
  Filled 2015-02-26: qty 1

## 2015-02-26 MED ORDER — IBUPROFEN 200 MG PO TABS
400.0000 mg | ORAL_TABLET | Freq: Once | ORAL | Status: AC
  Administered 2015-02-26: 400 mg via ORAL
  Filled 2015-02-26: qty 2

## 2015-02-26 NOTE — ED Notes (Signed)
Bed: WA09 Expected date:  Expected time:  Means of arrival:  Comments: EMS-SOB 

## 2015-02-26 NOTE — ED Notes (Signed)
Patient went to xray i will collect labs when she return

## 2015-02-26 NOTE — Progress Notes (Signed)
EDCM spoke to patient at bedside.  Patient confirms she has [employer] insurnace without a pcp.  [facility] provided patient with list of pcps who accept [employer] ins[employer] within a 10 mile radius of patient's zip code [phone].  Discussed impotance and purpose of having a pcp.  Discussed using ED for emergency situations.  Patient thankful for resources.  No further EDCM needs at this time.

## 2015-02-26 NOTE — ED Notes (Signed)
Shortness of breath has resolved without intervention but patient complains of severe headache.

## 2015-02-26 NOTE — ED Notes (Signed)
Patient states she was having lunch at school with her child when she had a sudden onset of headache and heaviness in her chest. She states that she became short of breath and tried to get someone at school to check her blood pressure and they would not. She was able to drive home, but had a neighbor to check her blood pressure and it "was high". Patient has history of hypertension and diabetes. EMS was called and patient had a systolic pressure of 297. She denies having shortness of breath or chest heaviness in the past. Patient was ambulatory upon arrival to the department and reports that the chest heaviness has decreased. EKG was normal for EMS.

## 2015-02-26 NOTE — ED Notes (Signed)
[facility] called for transport. 

## 2015-02-26 NOTE — ED Notes (Signed)
Attempted to call [facility] 2W for report, receiving Nurse will call back.

## 2015-02-26 NOTE — ED Provider Notes (Signed)
CSN: [id number]     Arrival date & time 02/26/15  1335 History   First MD Initiated Contact with Patient 02/26/15 1505     Chief Complaint  Patient presents with  . Headache  . Shortness of Breath     (Consider location/radiation/quality/duration/timing/severity/associated sxs/prior Treatment) HPI Reports felt well when to meet child at school lunch. Sudden onset of frontal/temporal H/A as well as central chest pressure. States left arm briefly felt numb but not weak. Felt SOB and hard to take deep breaths. No associated LE weakness or numbness. Feels much better now. Reports medication helped a lot (ibuprofen given).  Past Medical History  Diagnosis Date  . Hypertension   . Diabetes mellitus without complication    Past Surgical History  Procedure Laterality Date  . Cesarean section  1999 and 2011    x 2  . Tubal ligation  2011   Family History  Problem Relation Age of Onset  . Hypertension Mother   . Diabetes Father    History  Substance Use Topics  . Smoking status: Never Smoker   . Smokeless tobacco: Not on file  . Alcohol Use: Yes     Comment: occasional   OB History    No data available     Review of Systems 10 Systems reviewed and are negative for acute change except as noted in the HPI.   Allergies  Review of patient's allergies indicates no known allergies.  Home Medications   Prior to Admission medications   Medication Sig Start Date End Date Taking? Authorizing Provider  ibuprofen (ADVIL,MOTRIN) 200 MG tablet Take 800 mg by mouth once as needed for mild pain or moderate pain.   Yes Historical Provider, MD  lisinopril-hydrochlorothiazide (PRINZIDE,ZESTORETIC) 20-25 MG per tablet Take 1 tablet by mouth daily.   Yes Historical Provider, MD  metFORMIN (GLUCOPHAGE) 500 MG tablet Take 500 mg by mouth 2 (two) times daily.   Yes Historical Provider, MD  Multiple Vitamin (MULTIVITAMIN WITH MINERALS) TABS tablet Take 1 tablet by mouth daily.   Yes Historical  Provider, MD  HYDROcodone-acetaminophen (NORCO/VICODIN) 5-325 MG per tablet 1 or 2 tabs PO q6 hours prn pain Patient not taking: Reported on 02/26/2015 07/31/14   Francine Graven, DO  methocarbamol (ROBAXIN) 500 MG tablet Take 2 tablets (1,000 mg total) by mouth 4 (four) times daily as needed for muscle spasms (muscle spasm/pain). Patient not taking: Reported on 02/26/2015 07/31/14   Francine Graven, DO   BP 116/77 mmHg  Pulse 74  Temp(Src) 98.2 F (36.8 C) (Oral)  Resp 19  SpO2 98%  LMP 02/19/2015 Physical Exam  Constitutional: She is oriented to person, place, and time. She appears well-developed and well-nourished.  HENT:  Head: Normocephalic and atraumatic.  Eyes: EOM are normal. Pupils are equal, round, and reactive to light.  Neck: Neck supple.  Cardiovascular: Normal rate, regular rhythm, normal heart sounds and intact distal pulses.   Pulmonary/Chest: Effort normal and breath sounds normal.  Abdominal: Soft. Bowel sounds are normal. She exhibits no distension. There is no tenderness.  Musculoskeletal: Normal range of motion. She exhibits no edema.  Neurological: She is alert and oriented to person, place, and time. She has normal strength. Coordination normal. GCS eye subscore is 4. GCS verbal subscore is 5. GCS motor subscore is 6.  Skin: Skin is warm, dry and intact.  Psychiatric: She has a normal mood and affect.    ED Course  Procedures (including critical care time) Labs Review Labs Reviewed  URINALYSIS, ROUTINE  W REFLEX MICROSCOPIC - Abnormal; Notable for the following:    Hgb urine dipstick TRACE (*)    All other components within normal limits  COMPREHENSIVE METABOLIC PANEL - Abnormal; Notable for the following:    Potassium 3.3 (*)    All other components within normal limits  TROPONIN I - Abnormal; Notable for the following:    Troponin I 0.44 (*)    All other components within normal limits  CBC WITH DIFFERENTIAL/PLATELET - Abnormal; Notable for the  following:    WBC 12.0 (*)    MCH 25.3 (*)    Neutro Abs 8.1 (*)    All other components within normal limits  CBG MONITORING, ED - Abnormal; Notable for the following:    Glucose-Capillary 107 (*)    All other components within normal limits  URINE MICROSCOPIC-ADD ON  POC URINE PREG, ED    Imaging Review Ct Angio Head W/cm &/or Wo Cm  02/26/2015   CLINICAL DATA:  Headache.  Hypertension.  EXAM: CT ANGIOGRAPHY HEAD AND NECK  TECHNIQUE: Multidetector CT imaging of the head and neck was performed using the standard protocol during bolus administration of intravenous contrast. Multiplanar CT image reconstructions and MIPs were obtained to evaluate the vascular anatomy. Carotid stenosis measurements (when applicable) are obtained utilizing NASCET criteria, using the distal internal carotid diameter as the denominator.  CONTRAST:  148mL OMNIPAQUE IOHEXOL 350 MG/ML SOLN  COMPARISON:  None.  FINDINGS: CT HEAD  Brain: No acute infarct, hemorrhage, or mass lesion is present. The ventricles are of normal size. No significant extraaxial fluid collection is present.  Calvarium and skull base: Negative  Paranasal sinuses: Clear  Orbits: Within normal limits  CTA NECK  Aortic arch: A 3 vessel arch configuration is present. There is mild tortuosity of the innominate artery without significant stenosis.  Right carotid system: The right common carotid artery is within normal limits. The bifurcation is unremarkable. There is mild tortuosity of the cervical right ICA without a significant stenosis.  Left carotid system: The left common carotid artery is within normal limits. Bifurcation is unremarkable. There is mild tortuosity of the cervical left ICA without a significant stenosis.  Vertebral arteries:The proximal vertebral arteries are poorly visualized due to beam hardening artifact. The both originate from the subclavian arteries. The left vertebral artery is slightly dominant to the right. There is no significant  stenosis in the neck.  Skeleton: There is some straightening of the normal cervical lordosis without focal lytic or blastic lesions. Alignment is anatomic.  Other neck: No other focal soft tissue lesion is present. The salivary glands are within normal limits. Mucosal surfaces are normal. Thyroid is unremarkable. There is no significant adenopathy.  CTA HEAD  Anterior circulation: Cavernous internal carotid arteries are somewhat difficult to distinguish from the sinus given the late phase of imaging. The terminal ICA is normal bilaterally. The A1 and M1 segments are normal. The anterior communicating artery is patent. The right A1 segment is dominant. The MCA bifurcations are within normal limits. The ACA and MCA branch vessels are unremarkable.  Posterior circulation: The vertebral arteries are codominant. The left PICA origin is visualized and normal. The a right PICA is not seen. The basilar artery is within normal limits. Of post cerebral arteries originate from the basilar tip. The PCA branch vessels are within normal limits bilaterally.  Venous sinuses: The dural sinuses are patent. The left transverse sinus is dominant.  Anatomic variants: None  Delayed phase: Postcontrast images of the brain demonstrate  no pathologic enhancement.  IMPRESSION: 1. Mild tortuosity of the cervical vasculature compatible with a history of hypertension. 2. No significant stenosis or occlusion. 3. No evidence for acute infarct.   Electronically Signed   By: San Morelle M.D.   On: 02/26/2015 20:22   Dg Chest 2 View  02/26/2015   CLINICAL DATA:  Shortness of breath and chest heaviness  EXAM: CHEST  2 VIEW  COMPARISON:  07/31/2014  FINDINGS: Cardiac shadow is within normal limits. The lungs are clear bilaterally. No acute bony abnormality is seen.  IMPRESSION: No active cardiopulmonary disease.   Electronically Signed   By: Inez Catalina M.D.   On: 02/26/2015 16:27   Ct Head Wo Contrast  02/26/2015   CLINICAL DATA:   Headache  EXAM: CT HEAD WITHOUT CONTRAST  TECHNIQUE: Contiguous axial images were obtained from the base of the skull through the vertex without intravenous contrast.  COMPARISON:  None.  FINDINGS: Ventricle size is normal. Negative for acute or chronic infarction. Negative for hemorrhage or fluid collection. Negative for mass or edema. No shift of the midline structures.  Calvarium is intact.  IMPRESSION: Normal   Electronically Signed   By: Franchot Gallo M.D.   On: 02/26/2015 15:44   Ct Angio Neck W/cm &/or Wo/cm  02/26/2015   CLINICAL DATA:  Headache.  Hypertension.  EXAM: CT ANGIOGRAPHY HEAD AND NECK  TECHNIQUE: Multidetector CT imaging of the head and neck was performed using the standard protocol during bolus administration of intravenous contrast. Multiplanar CT image reconstructions and MIPs were obtained to evaluate the vascular anatomy. Carotid stenosis measurements (when applicable) are obtained utilizing NASCET criteria, using the distal internal carotid diameter as the denominator.  CONTRAST:  169mL OMNIPAQUE IOHEXOL 350 MG/ML SOLN  COMPARISON:  None.  FINDINGS: CT HEAD  Brain: No acute infarct, hemorrhage, or mass lesion is present. The ventricles are of normal size. No significant extraaxial fluid collection is present.  Calvarium and skull base: Negative  Paranasal sinuses: Clear  Orbits: Within normal limits  CTA NECK  Aortic arch: A 3 vessel arch configuration is present. There is mild tortuosity of the innominate artery without significant stenosis.  Right carotid system: The right common carotid artery is within normal limits. The bifurcation is unremarkable. There is mild tortuosity of the cervical right ICA without a significant stenosis.  Left carotid system: The left common carotid artery is within normal limits. Bifurcation is unremarkable. There is mild tortuosity of the cervical left ICA without a significant stenosis.  Vertebral arteries:The proximal vertebral arteries are poorly  visualized due to beam hardening artifact. The both originate from the subclavian arteries. The left vertebral artery is slightly dominant to the right. There is no significant stenosis in the neck.  Skeleton: There is some straightening of the normal cervical lordosis without focal lytic or blastic lesions. Alignment is anatomic.  Other neck: No other focal soft tissue lesion is present. The salivary glands are within normal limits. Mucosal surfaces are normal. Thyroid is unremarkable. There is no significant adenopathy.  CTA HEAD  Anterior circulation: Cavernous internal carotid arteries are somewhat difficult to distinguish from the sinus given the late phase of imaging. The terminal ICA is normal bilaterally. The A1 and M1 segments are normal. The anterior communicating artery is patent. The right A1 segment is dominant. The MCA bifurcations are within normal limits. The ACA and MCA branch vessels are unremarkable.  Posterior circulation: The vertebral arteries are codominant. The left PICA origin is visualized and normal.  The a right PICA is not seen. The basilar artery is within normal limits. Of post cerebral arteries originate from the basilar tip. The PCA branch vessels are within normal limits bilaterally.  Venous sinuses: The dural sinuses are patent. The left transverse sinus is dominant.  Anatomic variants: None  Delayed phase: Postcontrast images of the brain demonstrate no pathologic enhancement.  IMPRESSION: 1. Mild tortuosity of the cervical vasculature compatible with a history of hypertension. 2. No significant stenosis or occlusion. 3. No evidence for acute infarct.   Electronically Signed   By: San Morelle M.D.   On: 02/26/2015 20:22   Ct Angio Chest Aorta W/cm &/or Wo/cm  02/26/2015   CLINICAL DATA:  39 year old female presenting with sudden onset of headache and chest heaviness, with some associated shortness of breath. History of hypertension and diabetes. Elevated blood pressure.   EXAM: CT ANGIOGRAPHY CHEST WITH CONTRAST  TECHNIQUE: Multidetector CT imaging of the chest was performed using the standard protocol during bolus administration of intravenous contrast. Multiplanar CT image reconstructions and MIPs were obtained to evaluate the vascular anatomy.  CONTRAST:  165mL OMNIPAQUE IOHEXOL 350 MG/ML SOLN  COMPARISON:  No priors.  FINDINGS: Mediastinum/Lymph Nodes: No crescentic high attenuation associated with the wall of the thoracic aorta on precontrast images to indicate acute intramural hemorrhage. Large amount of pulsation artifact slightly limits the examination. With these limitations in mind, there is no definite evidence of thoracic aortic dissection. No thoracic aortic aneurysm. Ascending thoracic aorta, arch, and descending thoracic aorta measure 3.3 cm, 2.5 cm and 2.2 cm in diameter respectively. Heart size is normal. There is no significant pericardial fluid, thickening or pericardial calcification. Importantly, however, there is a 1.6 x 3.0 cm mass like lesion in the left atrium which appears intimately associated with the posterior aspect of the anterior leaflet of the mitral valve. On precontrast images this measures approximately 51 HU, as compared with the blood pool which is approximately 68-73 HU. Esophagus is normal in appearance. No pathologically enlarged mediastinal or hilar lymph nodes. No axillary lymphadenopathy.  Lungs/Pleura: No pneumothorax. No acute consolidative airspace disease. No pleural effusions. No suspicious appearing pulmonary nodules or masses.  Upper Abdomen: Unremarkable.  Musculoskeletal/Soft Tissues: There are no aggressive appearing lytic or blastic lesions noted in the visualized portions of the skeleton.  Review of the MIP images confirms the above findings.  IMPRESSION: 1. No evidence of aneurysm, dissection or other acute aortic syndrome of the thoracic aorta. 2. However, there is a 1.6 x 3.0 cm mass-like lesion in the left atrium which  appears intimately associated with the posterior aspect of the anterior leaflet of the mitral valve. 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 Other differential consideration would include a vegetation of the mitral valve, or a large papillary fibroelastoma (not strongly favored). Correlation with echocardiography is strongly recommended. These results were called by telephone at the time of interpretation on 02/26/2015 at 8:23 pm to Dr. Charlesetta Shanks, who verbally acknowledged these results.   Electronically Signed   By: Vinnie Langton M.D.   On: 02/26/2015 20:26     EKG Interpretation   Date/Time:  Friday February 26 2015 13:54:43 EST Ventricular Rate:  78 PR Interval:  153 QRS Duration: 84 QT Interval:  368 QTC Calculation: 419 R Axis:   19 Text Interpretation:  Sinus rhythm agree. normal Confirmed by Johnney Killian,  MD, Jeannie Done [phone]) on 02/26/2015 5:17:11 PM     Consult:18:29 discussed with  Dr. Aram Beecham (neurology). At this time need to proceed with CT angiogram of head neck and chest. Consult:18:35 discussed with Dr. Debara Pickett (cardiology). He will evaluate the patient in the emergency department.  CRITICAL CARE Performed by: Charlesetta Shanks   Total critical care time: 45  Critical care time was exclusive of separately billable procedures and treating other patients.  Critical care was necessary to treat or prevent imminent or life-threatening deterioration.  Critical care was time spent personally by me on the following activities: development of treatment plan with patient and/or surrogate as well as nursing, discussions with consultants, evaluation of patient's response to treatment, examination of patient, obtaining history from patient or surrogate, ordering and performing treatments and interventions, ordering and review of laboratory studies, ordering and review of radiographic studies, pulse oximetry and  re-evaluation of patient's condition. MDM   Final diagnoses:  Cardiac tumor, atrial  Other chest pain   The patient presented as outlined with an acute onset of symptoms without previous illness. She described herself as feeling well before a fairly sudden onset of chest pain and headache. Cardiac enzymes returned elevated however there was concern for possible cerebral anomaly with headache being a very pronounced symptom. CT angiogram of the head and neck were done to rule out any evidence of dissection or vascular anomaly before considering the initiation of heparin as discussed with Dr. Aram Beecham. 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 Cardiology was reconsulted and at that point time the case was reviewed with Dr. Eula Fried. He requests the patient to be transferred to [facility] for further diagnostic evaluation and treatment.    Charlesetta Shanks, MD 02/26/15 2149

## 2015-02-26 NOTE — ED Notes (Signed)
Patient transported to CT 

## 2015-02-26 NOTE — Consult Note (Signed)
CONSULTATION NOTE  Reason for Consult: Headache, Chest pain, cough, elevated troponin  Requesting Physician: Dr. Johnney Killian  Cardiologist: None (NEW)  HPI: This is a 39 y.o. female with a past medical history significant for hypertension, diabetes and family history of the same. She presents today with sudden onset of frontal temporal headache and then central chest pressure. She had some brief left arm numbness and felt some shortness of breath. She apparently responded to ibuprofen or aspirin. She is also had a chronic cough for about a month. Her initial substernal chest pressure was rated as 7 out of 10 and improved to a 3 out of 10 currently. The pain did not radiate to her back or have a tearing sensation. Laboratory work reveals an elevated white blood cell count of 12,000 with an elevated troponin I of 0.44. Potassium slightly low at 3.3. Head CT was negative for acute process. Chest x-ray is negative. EKG shows sinus rhythm with no ischemic changes.  PMHx:  Past Medical History  Diagnosis Date  . Hypertension   . Diabetes mellitus without complication    Past Surgical History  Procedure Laterality Date  . Cesarean section  1999 and 2011    x 2  . Tubal ligation  2011    FAMHx: Family History  Problem Relation Age of Onset  . Hypertension Mother   . Diabetes Father     SOCHx:  reports that she has never smoked. She does not have any smokeless tobacco history on file. She reports that she drinks alcohol. She reports that she does not use illicit drugs.  ALLERGIES: No Known Allergies  ROS: A comprehensive review of systems was negative except for: Respiratory: positive for cough and dyspnea on exertion Cardiovascular: positive for chest pain Neurological: positive for headaches  HOME MEDICATIONS:   Medication List    ASK your doctor about these medications        HYDROcodone-acetaminophen 5-325 MG per tablet  Commonly known as:  NORCO/VICODIN  1 or 2  tabs PO q6 hours prn pain     ibuprofen 200 MG tablet  Commonly known as:  ADVIL,MOTRIN  Take 800 mg by mouth once as needed for mild pain or moderate pain.     lisinopril-hydrochlorothiazide 20-25 MG per tablet  Commonly known as:  PRINZIDE,ZESTORETIC  Take 1 tablet by mouth daily.     metFORMIN 500 MG tablet  Commonly known as:  GLUCOPHAGE  Take 500 mg by mouth 2 (two) times daily.     methocarbamol 500 MG tablet  Commonly known as:  ROBAXIN  Take 2 tablets (1,000 mg total) by mouth 4 (four) times daily as needed for muscle spasms (muscle spasm/pain).     multivitamin with minerals Tabs tablet  Take 1 tablet by mouth daily.       HOSPITAL MEDICATIONS: None  VITALS: Blood pressure 126/77, pulse 64, temperature 98.2 F (36.8 C), temperature source Oral, resp. rate 12, last menstrual period 02/19/2015, SpO2 98 %.  PHYSICAL EXAM: General appearance: alert, no distress and Persistent dry nonproductive cough Neck: no carotid bruit and no JVD Lungs: clear to auscultation bilaterally Heart: regular rate and rhythm, S1, S2 normal, no murmur, click, rub or gallop Abdomen: soft, non-tender; bowel sounds normal; no masses,  no organomegaly and Obese Extremities: extremities normal, atraumatic, no cyanosis or edema Pulses: 2+ and symmetric Skin: Skin color, texture, turgor normal. No rashes or lesions Neurologic: Grossly normal Psych: Pleasant   LABS: Results for orders placed or performed during  the hospital encounter of 02/26/15 (from the past 48 hour(s))  CBG monitoring, ED     Status: Abnormal   Collection Time: 02/26/15  2:04 PM  Result Value Ref Range   Glucose-Capillary 107 (H) 70 - 99 mg/dL  Urinalysis, Routine w reflex microscopic     Status: Abnormal   Collection Time: 02/26/15  2:05 PM  Result Value Ref Range   Color, Urine YELLOW YELLOW   APPearance CLEAR CLEAR   Specific Gravity, Urine 1.005 1.005 - 1.030   pH 7.0 5.0 - 8.0   Glucose, UA NEGATIVE NEGATIVE  mg/dL   Hgb urine dipstick TRACE (A) NEGATIVE   Bilirubin Urine NEGATIVE NEGATIVE   Ketones, ur NEGATIVE NEGATIVE mg/dL   Protein, ur NEGATIVE NEGATIVE mg/dL   Urobilinogen, UA 0.2 0.0 - 1.0 mg/dL   Nitrite NEGATIVE NEGATIVE   Leukocytes, UA NEGATIVE NEGATIVE  Urine microscopic-add on     Status: None   Collection Time: 02/26/15  2:05 PM  Result Value Ref Range   Squamous Epithelial / LPF RARE RARE   WBC, UA 0-2 <3 WBC/hpf   RBC / HPF 0-2 <3 RBC/hpf   Bacteria, UA RARE RARE  POC Urine Pregnancy, ED (if pre-menopausal female) - NOT at [facility]     Status: None   Collection Time: 02/26/15  2:11 PM  Result Value Ref Range   Preg Test, Ur NEGATIVE NEGATIVE    Comment:        THE SENSITIVITY OF THIS METHODOLOGY IS >24 mIU/mL   Comprehensive metabolic panel     Status: Abnormal   Collection Time: 02/26/15  3:46 PM  Result Value Ref Range   Sodium 137 135 - 145 mmol/L   Potassium 3.3 (L) 3.5 - 5.1 mmol/L   Chloride 102 96 - 112 mmol/L   CO2 25 19 - 32 mmol/L   Glucose, Bld 99 70 - 99 mg/dL   BUN 13 6 - 23 mg/dL   Creatinine, Ser 0.65 0.50 - 1.10 mg/dL   Calcium 9.1 8.4 - 10.5 mg/dL   Total Protein 7.7 6.0 - 8.3 g/dL   Albumin 4.1 3.5 - 5.2 g/dL   AST 27 0 - 37 U/L   ALT 26 0 - 35 U/L   Alkaline Phosphatase 81 39 - 117 U/L   Total Bilirubin 0.3 0.3 - 1.2 mg/dL   GFR calc non Af Amer >90 >90 mL/min   GFR calc Af Amer >90 >90 mL/min    Comment: (NOTE) The eGFR has been calculated using the CKD EPI equation. This calculation has not been validated in all clinical situations. eGFR's persistently <90 mL/min signify possible Chronic Kidney Disease.    Anion gap 10 5 - 15  Troponin I     Status: Abnormal   Collection Time: 02/26/15  3:46 PM  Result Value Ref Range   Troponin I 0.44 (H) <0.031 ng/mL    Comment:        PERSISTENTLY INCREASED TROPONIN VALUES IN THE RANGE OF 0.04-0.49 ng/mL CAN BE SEEN IN:       -UNSTABLE ANGINA       -CONGESTIVE HEART FAILURE       -MYOCARDITIS        -CHEST TRAUMA       -ARRYHTHMIAS       -LATE PRESENTING MYOCARDIAL INFARCTION       -COPD   CLINICAL FOLLOW-UP RECOMMENDED.   CBC with Differential     Status: Abnormal   Collection Time: 02/26/15  3:46 PM  Result Value Ref Range   WBC 12.0 (H) 4.0 - 10.5 K/uL   RBC 4.98 3.87 - 5.11 MIL/uL   Hemoglobin 12.6 12.0 - 15.0 g/dL   HCT 40.3 36.0 - 46.0 %   MCV 80.9 78.0 - 100.0 fL   MCH 25.3 (L) 26.0 - 34.0 pg   MCHC 31.3 30.0 - 36.0 g/dL   RDW 15.0 11.5 - 15.5 %   Platelets 334 150 - 400 K/uL   Neutrophils Relative % 68 43 - 77 %   Neutro Abs 8.1 (H) 1.7 - 7.7 K/uL   Lymphocytes Relative 26 12 - 46 %   Lymphs Abs 3.1 0.7 - 4.0 K/uL   Monocytes Relative 5 3 - 12 %   Monocytes Absolute 0.6 0.1 - 1.0 K/uL   Eosinophils Relative 1 0 - 5 %   Eosinophils Absolute 0.1 0.0 - 0.7 K/uL   Basophils Relative 0 0 - 1 %   Basophils Absolute 0.0 0.0 - 0.1 K/uL    IMAGING: Dg Chest 2 View  02/26/2015   CLINICAL DATA:  Shortness of breath and chest heaviness  EXAM: CHEST  2 VIEW  COMPARISON:  07/31/2014  FINDINGS: Cardiac shadow is within normal limits. The lungs are clear bilaterally. No acute bony abnormality is seen.  IMPRESSION: No active cardiopulmonary disease.   Electronically Signed   By: Inez Catalina M.D.   On: 02/26/2015 16:27   Ct Head Wo Contrast  02/26/2015   CLINICAL DATA:  Headache  EXAM: CT HEAD WITHOUT CONTRAST  TECHNIQUE: Contiguous axial images were obtained from the base of the skull through the vertex without intravenous contrast.  COMPARISON:  None.  FINDINGS: Ventricle size is normal. Negative for acute or chronic infarction. Negative for hemorrhage or fluid collection. Negative for mass or edema. No shift of the midline structures.  Calvarium is intact.  IMPRESSION: Normal   Electronically Signed   By: Franchot Gallo M.D.   On: 02/26/2015 15:44    HOSPITAL DIAGNOSES: Principal Problem:   Headache Active Problems:   Chest pain   Elevated troponin I level   HTN  (hypertension)   DM2 (diabetes mellitus, type 2)   IMPRESSION: 1. Chest pain- Mrs. Algis Downs is describing chest pain which was of sudden onset and associated with headache. She does not have a history of headaches in the past. This headache seems to have improved with ibuprofen or aspirin in her chest pain has as well. I wonder how the two are related. CT is negative for acute bleed however we do know that acute CNS events can cause chest pain. They also can cause troponin elevation. She may ultimately need an MRI and good neurologic evaluation. From a cardiac standpoint, her EKG is normal. She has a few cardiac risk factors, but is a young age and acute coronary syndrome seems unlikely. She has had a persistent cough and some shortness of breath, therefore I wonder if she has a viral cardiomyopathy. The ER is entertaining dissection and/or pulmonary embolus which seems unlikely, however I will follow-up on her CT pulmonary angiogram. If this study is negative, I would recommend heparinization and checking cardiac enzymes. Would also recommend checking a 2-D echocardiogram tomorrow. The weekend rounding team will be by to see her and if her troponin continues to elevate, she will likely need to be transferred [facility] for cardiac catheterization 2. HTN - well-controlled, not suggestive of hypertensive emergency 3. DM2- on oral medications for 3 months, not likely a significant cardiac risk  factor at this point 4. Persistent cough- ?post-viral or related to underlying cardiomyopathy? Asthma?  Thanks for consulting [place]. We will follow closely with the primary team.  Time Spent Directly with Patient: 45 minutes  Pixie Casino, MD, [facility] Attending Cardiologist [facility]  HILTY,Kenneth C 02/26/2015, 7:06 PM

## 2015-02-26 NOTE — ED Notes (Signed)
Pt. Ambulatory, went to bathroom without distress .

## 2015-02-27 DIAGNOSIS — I5189 Other ill-defined heart diseases: Secondary | ICD-10-CM

## 2015-02-27 DIAGNOSIS — I10 Essential (primary) hypertension: Secondary | ICD-10-CM

## 2015-02-27 DIAGNOSIS — R079 Chest pain, unspecified: Secondary | ICD-10-CM | POA: Diagnosis present

## 2015-02-27 DIAGNOSIS — E119 Type 2 diabetes mellitus without complications: Secondary | ICD-10-CM

## 2015-02-27 DIAGNOSIS — D489 Neoplasm of uncertain behavior, unspecified: Secondary | ICD-10-CM

## 2015-02-27 DIAGNOSIS — I209 Angina pectoris, unspecified: Secondary | ICD-10-CM

## 2015-02-27 HISTORY — DX: Chest pain, unspecified: R07.9

## 2015-02-27 LAB — CBC
HCT: 35.9 % — ABNORMAL LOW (ref 36.0–46.0)
Hemoglobin: 11.4 g/dL — ABNORMAL LOW (ref 12.0–15.0)
MCH: 25.9 pg — ABNORMAL LOW (ref 26.0–34.0)
MCHC: 31.8 g/dL (ref 30.0–36.0)
MCV: 81.6 fL (ref 78.0–100.0)
Platelets: 295 10*3/uL (ref 150–400)
RBC: 4.4 MIL/uL (ref 3.87–5.11)
RDW: 15.2 % (ref 11.5–15.5)
WBC: 10.9 10*3/uL — ABNORMAL HIGH (ref 4.0–10.5)

## 2015-02-27 LAB — COMPREHENSIVE METABOLIC PANEL
ALT: 22 U/L (ref 0–35)
AST: 34 U/L (ref 0–37)
Albumin: 3.1 g/dL — ABNORMAL LOW (ref 3.5–5.2)
Alkaline Phosphatase: 66 U/L (ref 39–117)
Anion gap: 9 (ref 5–15)
BUN: 8 mg/dL (ref 6–23)
CO2: 24 mmol/L (ref 19–32)
Calcium: 8.2 mg/dL — ABNORMAL LOW (ref 8.4–10.5)
Chloride: 107 mmol/L (ref 96–112)
Creatinine, Ser: 0.54 mg/dL (ref 0.50–1.10)
GFR calc Af Amer: >90 mL/min (ref >90)
GFR calc non Af Amer: >90 mL/min (ref >90)
Glucose, Bld: 103 mg/dL — ABNORMAL HIGH (ref 70–99)
Potassium: 3.4 mmol/L — ABNORMAL LOW (ref 3.5–5.1)
Sodium: 140 mmol/L (ref 135–145)
Total Bilirubin: 0.3 mg/dL (ref 0.3–1.2)
Total Protein: 6.6 g/dL (ref 6.0–8.3)

## 2015-02-27 LAB — GLUCOSE, CAPILLARY
Glucose-Capillary: 102 mg/dL — ABNORMAL HIGH (ref 70–99)
Glucose-Capillary: 97 mg/dL (ref 70–99)
Glucose-Capillary: 112 mg/dL — ABNORMAL HIGH (ref 70–99)
Glucose-Capillary: 114 mg/dL — ABNORMAL HIGH (ref 70–99)

## 2015-02-27 LAB — TROPONIN I
Troponin I: 2.36 ng/mL (ref <0.031)
Troponin I: 1.63 ng/mL (ref <0.031)
Troponin I: 1.24 ng/mL (ref <0.031)

## 2015-02-27 LAB — MAGNESIUM: Magnesium: 2 mg/dL (ref 1.5–2.5)

## 2015-02-27 LAB — PHOSPHORUS: Phosphorus: 4 mg/dL (ref 2.3–4.6)

## 2015-02-27 MED ORDER — HEPARIN SODIUM (PORCINE) 5000 UNIT/ML IJ SOLN
5000.0000 [IU] | Freq: Three times a day (TID) | INTRAMUSCULAR | Status: AC
  Administered 2015-02-27 – 2015-03-03 (×12): 5000 [IU] via SUBCUTANEOUS
  Filled 2015-02-27 (×17): qty 1

## 2015-02-27 MED ORDER — MORPHINE SULFATE 2 MG/ML IJ SOLN
2.0000 mg | INTRAMUSCULAR | Status: DC | PRN
  Filled 2015-02-27: qty 1

## 2015-02-27 MED ORDER — SODIUM CHLORIDE 0.9 % IJ SOLN
3.0000 mL | Freq: Two times a day (BID) | INTRAMUSCULAR | Status: DC
  Administered 2015-02-27 – 2015-03-02 (×6): 3 mL via INTRAVENOUS

## 2015-02-27 MED ORDER — INSULIN ASPART 100 UNIT/ML ~~LOC~~ SOLN: 0.0000 [IU] | Freq: Three times a day (TID) | SUBCUTANEOUS | Status: DC

## 2015-02-27 MED ORDER — SODIUM CHLORIDE 0.9 % IV SOLN: 250.0000 mL | INTRAVENOUS | Status: DC | PRN

## 2015-02-27 MED ORDER — INSULIN ASPART 100 UNIT/ML ~~LOC~~ SOLN: 0.0000 [IU] | Freq: Every day | SUBCUTANEOUS | Status: DC

## 2015-02-27 MED ORDER — ONDANSETRON HCL 4 MG/2ML IJ SOLN
4.0000 mg | Freq: Four times a day (QID) | INTRAMUSCULAR | Status: DC | PRN
  Administered 2015-03-01: 4 mg via INTRAVENOUS
  Filled 2015-02-27: qty 2

## 2015-02-27 MED ORDER — SODIUM CHLORIDE 0.9 % IV SOLN
INTRAVENOUS | Status: AC
Start: 2015-02-27 — End: 2015-02-27

## 2015-02-27 MED ORDER — ACETAMINOPHEN 325 MG PO TABS
650.0000 mg | ORAL_TABLET | Freq: Four times a day (QID) | ORAL | Status: DC | PRN
  Administered 2015-02-27 – 2015-03-01 (×4): 650 mg via ORAL
  Filled 2015-02-27 (×4): qty 2

## 2015-02-27 MED ORDER — IBUPROFEN 400 MG PO TABS
400.0000 mg | ORAL_TABLET | Freq: Once | ORAL | Status: AC
  Administered 2015-02-27: 400 mg via ORAL
  Filled 2015-02-27: qty 1

## 2015-02-27 MED ORDER — ONDANSETRON HCL 4 MG PO TABS: 4.0000 mg | ORAL_TABLET | Freq: Four times a day (QID) | ORAL | Status: DC | PRN

## 2015-02-27 MED ORDER — SODIUM CHLORIDE 0.9 % IJ SOLN: 3.0000 mL | INTRAMUSCULAR | Status: DC | PRN

## 2015-02-27 MED ORDER — ASPIRIN 325 MG PO TABS
325.0000 mg | ORAL_TABLET | Freq: Once | ORAL | Status: AC
  Administered 2015-02-27: 325 mg via ORAL
  Filled 2015-02-27: qty 1

## 2015-02-27 NOTE — Progress Notes (Signed)
  Echocardiogram 2D Echocardiogram has been performed.  Debra Diaz 02/27/2015, 9:48 AM

## 2015-02-27 NOTE — Plan of Care (Signed)
CARDIOLOGY INPATIENT HISTORY AND PHYSICAL EXAMINATION NOTE  Patient ID: Debra Diaz MRN: [id number], DOB/AGE: 1976-07-02   Admit date: 02/26/2015   Primary Physician: No PCP Per Patient Primary Cardiologist: Dr. Debara Pickett has seen the patient  Reason for admission: cardiac tumor  HPI: This is a 39 y.o.Hispanic female with no known history of CAD and risk factors (hypertension, diabetes, obesity, sedentary lifestyle, mixed hyperlipidemia),  who presented with chest pain. Patient was in his usual state of health when yesterday and was with her daughter in her school in cafeteria around 28 AM when she felt headache which was sharp and frontal in nature. Accompanying that headache was heavy chest pain which stayed for an hour. It was 10/10 in intensity, non radiating, non position in nature. It did not change with deep breathing, cough or food. Patient had some SOB associated with it.     Problem List: Past Medical History  Diagnosis Date  . Hypertension   . Diabetes mellitus without complication     Past Surgical History  Procedure Laterality Date  . Cesarean section  1999 and 2011    x 2  . Tubal ligation  2011     Allergies: No Known Allergies   Home Medications Current Facility-Administered Medications  Medication Dose Route Frequency Provider Last Rate Last Dose  . 0.9 %  sodium chloride infusion  1,000 mL Intravenous Continuous Charlesetta Shanks, MD   Stopped at 02/27/15 0200  . 0.9 %  sodium chloride infusion   Intravenous STAT Charlesetta Shanks, MD   0  at 02/27/15 0300  . 0.9 %  sodium chloride infusion  250 mL Intravenous PRN Flossie Dibble, MD      . heparin injection 5,000 Units  5,000 Units Subcutaneous 3 times per day Flossie Dibble, MD   5,000 Units at 02/27/15 0623  . insulin aspart (novoLOG) injection 0-5 Units  0-5 Units Subcutaneous QHS Flossie Dibble, MD      . insulin aspart (novoLOG) injection 0-9 Units  0-9 Units Subcutaneous TID WC Flossie Dibble, MD    0 Units at 02/27/15 [phone]  . morphine 2 MG/ML injection 2 mg  2 mg Intravenous Q4H PRN Flossie Dibble, MD      . ondansetron [facility]) tablet 4 mg  4 mg Oral Q6H PRN Flossie Dibble, MD       Or  . ondansetron (ZOFRAN) injection 4 mg  4 mg Intravenous Q6H PRN Flossie Dibble, MD      . sodium chloride 0.9 % injection 3 mL  3 mL Intravenous Q12H Flossie Dibble, MD   3 mL at 02/27/15 0300  . sodium chloride 0.9 % injection 3 mL  3 mL Intravenous PRN Flossie Dibble, MD         Family History  Problem Relation Age of Onset  . Hypertension Mother   . Diabetes Father      History   Social History  . Marital Status: Married    Spouse Name: N/A  . Number of Children: N/A  . Years of Education: N/A   Occupational History  . Not on file.   Social History Main Topics  . Smoking status: Never Smoker   . Smokeless tobacco: Not on file  . Alcohol Use: Yes     Comment: occasional  . Drug Use: No  . Sexual Activity: Yes    Birth Control/ Protection: Surgical   Other Topics Concern  . Not on file   Social  History Narrative     Review of Systems: General: negative for chills, fever, night sweats or weight changes.  Cardiovascular: chest pain, dyspnea negative for dyspnea on exertion, edema, orthopnea, palpitations, paroxysmal nocturnal dyspnea or shortness of breath  Dermatological: negative for rash Respiratory: negative for cough or wheezing Urologic: negative for hematuria Abdominal: negative for nausea, vomiting, diarrhea, bright red blood per rectum, melena, or hematemesis Neurologic: negative for visual changes, syncope, or dizziness  Physical Exam: Vitals: BP 99/65 mmHg  Pulse 74  Temp(Src) 99.1 F (37.3 C) (Oral)  Resp 18  Ht 5\' 1"  (1.549 m)  Wt 97.3 kg (214 lb 8.1 oz)  BMI 40.55 kg/m2  SpO2 98%  LMP 02/19/2015 General: not in acute distress Neck: JVP flat, neck supple Heart: regular rate and rhythm, S1, S2, no murmurs and no tumor plop Lungs: CTAB  GI: non  tender, non distended, bowel sounds present Extremities: no edema Neuro: AAO x 3  Psych: normal affect, no anxiety   Labs:   Results for orders placed or performed during the hospital encounter of 02/26/15 (from the past 24 hour(s))  CBG monitoring, ED     Status: Abnormal   Collection Time: 02/26/15  2:04 PM  Result Value Ref Range   Glucose-Capillary 107 (H) 70 - 99 mg/dL  Urinalysis, Routine w reflex microscopic     Status: Abnormal   Collection Time: 02/26/15  2:05 PM  Result Value Ref Range   Color, Urine YELLOW YELLOW   APPearance CLEAR CLEAR   Specific Gravity, Urine 1.005 1.005 - 1.030   pH 7.0 5.0 - 8.0   Glucose, UA NEGATIVE NEGATIVE mg/dL   Hgb urine dipstick TRACE (A) NEGATIVE   Bilirubin Urine NEGATIVE NEGATIVE   Ketones, ur NEGATIVE NEGATIVE mg/dL   Protein, ur NEGATIVE NEGATIVE mg/dL   Urobilinogen, UA 0.2 0.0 - 1.0 mg/dL   Nitrite NEGATIVE NEGATIVE   Leukocytes, UA NEGATIVE NEGATIVE  Urine microscopic-add on     Status: None   Collection Time: 02/26/15  2:05 PM  Result Value Ref Range   Squamous Epithelial / LPF RARE RARE   WBC, UA 0-2 <3 WBC/hpf   RBC / HPF 0-2 <3 RBC/hpf   Bacteria, UA RARE RARE  POC Urine Pregnancy, ED (if pre-menopausal female) - NOT at [facility]     Status: None   Collection Time: 02/26/15  2:11 PM  Result Value Ref Range   Preg Test, Ur NEGATIVE NEGATIVE  Comprehensive metabolic panel     Status: Abnormal   Collection Time: 02/26/15  3:46 PM  Result Value Ref Range   Sodium 137 135 - 145 mmol/L   Potassium 3.3 (L) 3.5 - 5.1 mmol/L   Chloride 102 96 - 112 mmol/L   CO2 25 19 - 32 mmol/L   Glucose, Bld 99 70 - 99 mg/dL   BUN 13 6 - 23 mg/dL   Creatinine, Ser 0.65 0.50 - 1.10 mg/dL   Calcium 9.1 8.4 - 10.5 mg/dL   Total Protein 7.7 6.0 - 8.3 g/dL   Albumin 4.1 3.5 - 5.2 g/dL   AST 27 0 - 37 U/L   ALT 26 0 - 35 U/L   Alkaline Phosphatase 81 39 - 117 U/L   Total Bilirubin 0.3 0.3 - 1.2 mg/dL   GFR calc non Af Amer >90 >90 mL/min    GFR calc Af Amer >90 >90 mL/min   Anion gap 10 5 - 15  Troponin I     Status: Abnormal   Collection  Time: 02/26/15  3:46 PM  Result Value Ref Range   Troponin I 0.44 (H) <0.031 ng/mL  CBC with Differential     Status: Abnormal   Collection Time: 02/26/15  3:46 PM  Result Value Ref Range   WBC 12.0 (H) 4.0 - 10.5 K/uL   RBC 4.98 3.87 - 5.11 MIL/uL   Hemoglobin 12.6 12.0 - 15.0 g/dL   HCT 40.3 36.0 - 46.0 %   MCV 80.9 78.0 - 100.0 fL   MCH 25.3 (L) 26.0 - 34.0 pg   MCHC 31.3 30.0 - 36.0 g/dL   RDW 15.0 11.5 - 15.5 %   Platelets 334 150 - 400 K/uL   Neutrophils Relative % 68 43 - 77 %   Neutro Abs 8.1 (H) 1.7 - 7.7 K/uL   Lymphocytes Relative 26 12 - 46 %   Lymphs Abs 3.1 0.7 - 4.0 K/uL   Monocytes Relative 5 3 - 12 %   Monocytes Absolute 0.6 0.1 - 1.0 K/uL   Eosinophils Relative 1 0 - 5 %   Eosinophils Absolute 0.1 0.0 - 0.7 K/uL   Basophils Relative 0 0 - 1 %   Basophils Absolute 0.0 0.0 - 0.1 K/uL  CBC     Status: Abnormal   Collection Time: 02/27/15  3:30 AM  Result Value Ref Range   WBC 10.9 (H) 4.0 - 10.5 K/uL   RBC 4.40 3.87 - 5.11 MIL/uL   Hemoglobin 11.4 (L) 12.0 - 15.0 g/dL   HCT 35.9 (L) 36.0 - 46.0 %   MCV 81.6 78.0 - 100.0 fL   MCH 25.9 (L) 26.0 - 34.0 pg   MCHC 31.8 30.0 - 36.0 g/dL   RDW 15.2 11.5 - 15.5 %   Platelets 295 150 - 400 K/uL  Comprehensive metabolic panel     Status: Abnormal   Collection Time: 02/27/15  3:30 AM  Result Value Ref Range   Sodium 140 135 - 145 mmol/L   Potassium 3.4 (L) 3.5 - 5.1 mmol/L   Chloride 107 96 - 112 mmol/L   CO2 24 19 - 32 mmol/L   Glucose, Bld 103 (H) 70 - 99 mg/dL   BUN 8 6 - 23 mg/dL   Creatinine, Ser 0.54 0.50 - 1.10 mg/dL   Calcium 8.2 (L) 8.4 - 10.5 mg/dL   Total Protein 6.6 6.0 - 8.3 g/dL   Albumin 3.1 (L) 3.5 - 5.2 g/dL   AST 34 0 - 37 U/L   ALT 22 0 - 35 U/L   Alkaline Phosphatase 66 39 - 117 U/L   Total Bilirubin 0.3 0.3 - 1.2 mg/dL   GFR calc non Af Amer >90 >90 mL/min   GFR calc Af Amer >90 >90  mL/min   Anion gap 9 5 - 15  Magnesium     Status: None   Collection Time: 02/27/15  3:30 AM  Result Value Ref Range   Magnesium 2.0 1.5 - 2.5 mg/dL  Phosphorus     Status: None   Collection Time: 02/27/15  3:30 AM  Result Value Ref Range   Phosphorus 4.0 2.3 - 4.6 mg/dL     Radiology/Studies: Ct Angio Head W/cm &/or Wo Cm  02/26/2015   CLINICAL DATA:  Headache.  Hypertension.  EXAM: CT ANGIOGRAPHY HEAD AND NECK  TECHNIQUE: Multidetector CT imaging of the head and neck was performed using the standard protocol during bolus administration of intravenous contrast. Multiplanar CT image reconstructions and MIPs were obtained to evaluate the vascular anatomy. Carotid stenosis  measurements (when applicable) are obtained utilizing NASCET criteria, using the distal internal carotid diameter as the denominator.  CONTRAST:  160mL OMNIPAQUE IOHEXOL 350 MG/ML SOLN  COMPARISON:  None.  FINDINGS: CT HEAD  Brain: No acute infarct, hemorrhage, or mass lesion is present. The ventricles are of normal size. No significant extraaxial fluid collection is present.  Calvarium and skull base: Negative  Paranasal sinuses: Clear  Orbits: Within normal limits  CTA NECK  Aortic arch: A 3 vessel arch configuration is present. There is mild tortuosity of the innominate artery without significant stenosis.  Right carotid system: The right common carotid artery is within normal limits. The bifurcation is unremarkable. There is mild tortuosity of the cervical right ICA without a significant stenosis.  Left carotid system: The left common carotid artery is within normal limits. Bifurcation is unremarkable. There is mild tortuosity of the cervical left ICA without a significant stenosis.  Vertebral arteries:The proximal vertebral arteries are poorly visualized due to beam hardening artifact. The both originate from the subclavian arteries. The left vertebral artery is slightly dominant to the right. There is no significant stenosis in  the neck.  Skeleton: There is some straightening of the normal cervical lordosis without focal lytic or blastic lesions. Alignment is anatomic.  Other neck: No other focal soft tissue lesion is present. The salivary glands are within normal limits. Mucosal surfaces are normal. Thyroid is unremarkable. There is no significant adenopathy.  CTA HEAD  Anterior circulation: Cavernous internal carotid arteries are somewhat difficult to distinguish from the sinus given the late phase of imaging. The terminal ICA is normal bilaterally. The A1 and M1 segments are normal. The anterior communicating artery is patent. The right A1 segment is dominant. The MCA bifurcations are within normal limits. The ACA and MCA branch vessels are unremarkable.  Posterior circulation: The vertebral arteries are codominant. The left PICA origin is visualized and normal. The a right PICA is not seen. The basilar artery is within normal limits. Of post cerebral arteries originate from the basilar tip. The PCA branch vessels are within normal limits bilaterally.  Venous sinuses: The dural sinuses are patent. The left transverse sinus is dominant.  Anatomic variants: None  Delayed phase: Postcontrast images of the brain demonstrate no pathologic enhancement.  IMPRESSION: 1. Mild tortuosity of the cervical vasculature compatible with a history of hypertension. 2. No significant stenosis or occlusion. 3. No evidence for acute infarct.   Electronically Signed   By: San Morelle M.D.   On: 02/26/2015 20:22   Dg Chest 2 View  02/26/2015   CLINICAL DATA:  Shortness of breath and chest heaviness  EXAM: CHEST  2 VIEW  COMPARISON:  07/31/2014  FINDINGS: Cardiac shadow is within normal limits. The lungs are clear bilaterally. No acute bony abnormality is seen.  IMPRESSION: No active cardiopulmonary disease.   Electronically Signed   By: Inez Catalina M.D.   On: 02/26/2015 16:27   Ct Head Wo Contrast  02/26/2015   CLINICAL DATA:  Headache  EXAM:  CT HEAD WITHOUT CONTRAST  TECHNIQUE: Contiguous axial images were obtained from the base of the skull through the vertex without intravenous contrast.  COMPARISON:  None.  FINDINGS: Ventricle size is normal. Negative for acute or chronic infarction. Negative for hemorrhage or fluid collection. Negative for mass or edema. No shift of the midline structures.  Calvarium is intact.  IMPRESSION: Normal   Electronically Signed   By: Franchot Gallo M.D.   On: 02/26/2015 15:44   Ct  Angio Neck W/cm &/or Wo/cm  02/26/2015   CLINICAL DATA:  Headache.  Hypertension.  EXAM: CT ANGIOGRAPHY HEAD AND NECK  TECHNIQUE: Multidetector CT imaging of the head and neck was performed using the standard protocol during bolus administration of intravenous contrast. Multiplanar CT image reconstructions and MIPs were obtained to evaluate the vascular anatomy. Carotid stenosis measurements (when applicable) are obtained utilizing NASCET criteria, using the distal internal carotid diameter as the denominator.  CONTRAST:  132mL OMNIPAQUE IOHEXOL 350 MG/ML SOLN  COMPARISON:  None.  FINDINGS: CT HEAD  Brain: No acute infarct, hemorrhage, or mass lesion is present. The ventricles are of normal size. No significant extraaxial fluid collection is present.  Calvarium and skull base: Negative  Paranasal sinuses: Clear  Orbits: Within normal limits  CTA NECK  Aortic arch: A 3 vessel arch configuration is present. There is mild tortuosity of the innominate artery without significant stenosis.  Right carotid system: The right common carotid artery is within normal limits. The bifurcation is unremarkable. There is mild tortuosity of the cervical right ICA without a significant stenosis.  Left carotid system: The left common carotid artery is within normal limits. Bifurcation is unremarkable. There is mild tortuosity of the cervical left ICA without a significant stenosis.  Vertebral arteries:The proximal vertebral arteries are poorly visualized due to  beam hardening artifact. The both originate from the subclavian arteries. The left vertebral artery is slightly dominant to the right. There is no significant stenosis in the neck.  Skeleton: There is some straightening of the normal cervical lordosis without focal lytic or blastic lesions. Alignment is anatomic.  Other neck: No other focal soft tissue lesion is present. The salivary glands are within normal limits. Mucosal surfaces are normal. Thyroid is unremarkable. There is no significant adenopathy.  CTA HEAD  Anterior circulation: Cavernous internal carotid arteries are somewhat difficult to distinguish from the sinus given the late phase of imaging. The terminal ICA is normal bilaterally. The A1 and M1 segments are normal. The anterior communicating artery is patent. The right A1 segment is dominant. The MCA bifurcations are within normal limits. The ACA and MCA branch vessels are unremarkable.  Posterior circulation: The vertebral arteries are codominant. The left PICA origin is visualized and normal. The a right PICA is not seen. The basilar artery is within normal limits. Of post cerebral arteries originate from the basilar tip. The PCA branch vessels are within normal limits bilaterally.  Venous sinuses: The dural sinuses are patent. The left transverse sinus is dominant.  Anatomic variants: None  Delayed phase: Postcontrast images of the brain demonstrate no pathologic enhancement.  IMPRESSION: 1. Mild tortuosity of the cervical vasculature compatible with a history of hypertension. 2. No significant stenosis or occlusion. 3. No evidence for acute infarct.   Electronically Signed   By: San Morelle M.D.   On: 02/26/2015 20:22   Ct Angio Chest Aorta W/cm &/or Wo/cm  02/26/2015   CLINICAL DATA:  39 year old female presenting with sudden onset of headache and chest heaviness, with some associated shortness of breath. History of hypertension and diabetes. Elevated blood pressure.  EXAM: CT  ANGIOGRAPHY CHEST WITH CONTRAST  TECHNIQUE: Multidetector CT imaging of the chest was performed using the standard protocol during bolus administration of intravenous contrast. Multiplanar CT image reconstructions and MIPs were obtained to evaluate the vascular anatomy.  CONTRAST:  151mL OMNIPAQUE IOHEXOL 350 MG/ML SOLN  COMPARISON:  No priors.  FINDINGS: Mediastinum/Lymph Nodes: No crescentic high attenuation associated with the wall of the  thoracic aorta on precontrast images to indicate acute intramural hemorrhage. Large amount of pulsation artifact slightly limits the examination. With these limitations in mind, there is no definite evidence of thoracic aortic dissection. No thoracic aortic aneurysm. Ascending thoracic aorta, arch, and descending thoracic aorta measure 3.3 cm, 2.5 cm and 2.2 cm in diameter respectively. Heart size is normal. There is no significant pericardial fluid, thickening or pericardial calcification. Importantly, however, there is a 1.6 x 3.0 cm mass like lesion in the left atrium which appears intimately associated with the posterior aspect of the anterior leaflet of the mitral valve. On precontrast images this measures approximately 51 HU, as compared with the blood pool which is approximately 68-73 HU. Esophagus is normal in appearance. No pathologically enlarged mediastinal or hilar lymph nodes. No axillary lymphadenopathy.  Lungs/Pleura: No pneumothorax. No acute consolidative airspace disease. No pleural effusions. No suspicious appearing pulmonary nodules or masses.  Upper Abdomen: Unremarkable.  Musculoskeletal/Soft Tissues: There are no aggressive appearing lytic or blastic lesions noted in the visualized portions of the skeleton.  Review of the MIP images confirms the above findings.  IMPRESSION: 1. No evidence of aneurysm, dissection or other acute aortic syndrome of the thoracic aorta. 2. However, there is a 1.6 x 3.0 cm mass-like lesion in the left atrium which appears  intimately associated with the posterior aspect of the anterior leaflet of the mitral valve. 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 Other differential consideration would include a vegetation of the mitral valve, or a large papillary fibroelastoma (not strongly favored). Correlation with echocardiography is strongly recommended. These results were called by telephone at the time of interpretation on 02/26/2015 at 8:23 pm to Dr. Charlesetta Shanks, who verbally acknowledged these results.   Electronically Signed   By: Vinnie Langton M.D.   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 differential includes clot, metastasis, or vegetation Will need to evaluate by echocardiogram in AM  Headache, likely migrainous Improved with pain medication Prn meds  Hypertension, essential Continue home blood pressure meds Goal <140/90  Chest pain syndrome With elevated troponin, likely secondary to cardiac mass related Will cycle troponin, if getting elevated, will need cardiac catheterization  Since patient has risk factors and age ~56, and there is a chance of patient needing open heart surgery, left heart cath can inform about needing bypass   Current guidelines recommend >40 years to have left heart cath to evaluate for CAD if an open heart surgery is planned  Diabetes mellitus  with no mention of complication SSI, finger sticks No oral hypoglycemics HbA1c  Elevated troponin Likely secondary to embolism rather than plaque rupture as a cause Not started on IV heparin since there is increased risk of embolization with heparin in case of cardiac masses (weak evidence) - will give aspirin and statin  - check lipid panel for now  Signed, Flossie Dibble, MD MS 02/27/2015, 6:54 AM

## 2015-02-27 NOTE — Progress Notes (Signed)
Patient ID: Debra Diaz, female   DOB: 1976/12/07, 39 y.o.   MRN: [id number]  Primary Cardiologist [facility]  Subjective:  Denies SSCP, palpitations or Dyspnea Headache better   Objective:  Filed Vitals:   02/26/15 2300 02/27/15 0017 02/27/15 0142 02/27/15 0640  BP: 133/85 123/80 125/76 99/65  Pulse: 76 64 69 74  Temp:   99 F (37.2 C) 99.1 F (37.3 C)  TempSrc:   Oral Oral  Resp: 26 18 18 18   Height:   5\' 1"  (1.549 m)   Weight:   97.3 kg (214 lb 8.1 oz)   SpO2:  99% 100% 98%    Intake/Output from previous day:  Intake/Output Summary (Last 24 hours) at 02/27/15 0855 Last data filed at 02/26/15 2250  Gross per 24 hour  Intake   1000 ml  Output      0 ml  Net   1000 ml    Physical Exam: Affect appropriate Healthy:  appears stated age HEENT: normal Neck supple with no adenopathy JVP normal no bruits no thyromegaly Lungs clear with no wheezing and good diaphragmatic motion Heart:  S1/S2 no murmur, no rub, gallop or click PMI normal Abdomen: benighn, BS positve, no tenderness, no AAA no bruit.  No HSM or HJR Distal pulses intact with no bruits No edema Neuro non-focal Skin warm and dry No muscular weakness   Lab Results: Basic Metabolic Panel:  Recent Labs  02/26/15 1546 02/27/15 0330  NA 137 140  K 3.3* 3.4*  CL 102 107  CO2 25 24  GLUCOSE 99 103*  BUN 13 8  CREATININE 0.65 0.54  CALCIUM 9.1 8.2*  MG  --  2.0  PHOS  --  4.0   Liver Function Tests:  Recent Labs  02/26/15 1546 02/27/15 0330  AST 27 34  ALT 26 22  ALKPHOS 81 66  BILITOT 0.3 0.3  PROT 7.7 6.6  ALBUMIN 4.1 3.1*   CBC:  Recent Labs  02/26/15 1546 02/27/15 0330  WBC 12.0* 10.9*  NEUTROABS 8.1*  --   HGB 12.6 11.4*  HCT 40.3 35.9*  MCV 80.9 81.6  PLT 334 295   Cardiac Enzymes:  Recent Labs  02/26/15 1546  TROPONINI 0.44*    Imaging: Ct Angio Head W/cm &/or Wo Cm  02/26/2015   CLINICAL DATA:  Headache.  Hypertension.  EXAM: CT ANGIOGRAPHY HEAD AND NECK   TECHNIQUE: Multidetector CT imaging of the head and neck was performed using the standard protocol during bolus administration of intravenous contrast. Multiplanar CT image reconstructions and MIPs were obtained to evaluate the vascular anatomy. Carotid stenosis measurements (when applicable) are obtained utilizing NASCET criteria, using the distal internal carotid diameter as the denominator.  CONTRAST:  179mL OMNIPAQUE IOHEXOL 350 MG/ML SOLN  COMPARISON:  None.  FINDINGS: CT HEAD  Brain: No acute infarct, hemorrhage, or mass lesion is present. The ventricles are of normal size. No significant extraaxial fluid collection is present.  Calvarium and skull base: Negative  Paranasal sinuses: Clear  Orbits: Within normal limits  CTA NECK  Aortic arch: A 3 vessel arch configuration is present. There is mild tortuosity of the innominate artery without significant stenosis.  Right carotid system: The right common carotid artery is within normal limits. The bifurcation is unremarkable. There is mild tortuosity of the cervical right ICA without a significant stenosis.  Left carotid system: The left common carotid artery is within normal limits. Bifurcation is unremarkable. There is mild tortuosity of the cervical left ICA without a  significant stenosis.  Vertebral arteries:The proximal vertebral arteries are poorly visualized due to beam hardening artifact. The both originate from the subclavian arteries. The left vertebral artery is slightly dominant to the right. There is no significant stenosis in the neck.  Skeleton: There is some straightening of the normal cervical lordosis without focal lytic or blastic lesions. Alignment is anatomic.  Other neck: No other focal soft tissue lesion is present. The salivary glands are within normal limits. Mucosal surfaces are normal. Thyroid is unremarkable. There is no significant adenopathy.  CTA HEAD  Anterior circulation: Cavernous internal carotid arteries are somewhat difficult  to distinguish from the sinus given the late phase of imaging. The terminal ICA is normal bilaterally. The A1 and M1 segments are normal. The anterior communicating artery is patent. The right A1 segment is dominant. The MCA bifurcations are within normal limits. The ACA and MCA branch vessels are unremarkable.  Posterior circulation: The vertebral arteries are codominant. The left PICA origin is visualized and normal. The a right PICA is not seen. The basilar artery is within normal limits. Of post cerebral arteries originate from the basilar tip. The PCA branch vessels are within normal limits bilaterally.  Venous sinuses: The dural sinuses are patent. The left transverse sinus is dominant.  Anatomic variants: None  Delayed phase: Postcontrast images of the brain demonstrate no pathologic enhancement.  IMPRESSION: 1. Mild tortuosity of the cervical vasculature compatible with a history of hypertension. 2. No significant stenosis or occlusion. 3. No evidence for acute infarct.   Electronically Signed   By: San Morelle M.D.   On: 02/26/2015 20:22   Dg Chest 2 View  02/26/2015   CLINICAL DATA:  Shortness of breath and chest heaviness  EXAM: CHEST  2 VIEW  COMPARISON:  07/31/2014  FINDINGS: Cardiac shadow is within normal limits. The lungs are clear bilaterally. No acute bony abnormality is seen.  IMPRESSION: No active cardiopulmonary disease.   Electronically Signed   By: Inez Catalina M.D.   On: 02/26/2015 16:27   Ct Head Wo Contrast  02/26/2015   CLINICAL DATA:  Headache  EXAM: CT HEAD WITHOUT CONTRAST  TECHNIQUE: Contiguous axial images were obtained from the base of the skull through the vertex without intravenous contrast.  COMPARISON:  None.  FINDINGS: Ventricle size is normal. Negative for acute or chronic infarction. Negative for hemorrhage or fluid collection. Negative for mass or edema. No shift of the midline structures.  Calvarium is intact.  IMPRESSION: Normal   Electronically Signed   By:  Franchot Gallo M.D.   On: 02/26/2015 15:44   Ct Angio Neck W/cm &/or Wo/cm  02/26/2015   CLINICAL DATA:  Headache.  Hypertension.  EXAM: CT ANGIOGRAPHY HEAD AND NECK  TECHNIQUE: Multidetector CT imaging of the head and neck was performed using the standard protocol during bolus administration of intravenous contrast. Multiplanar CT image reconstructions and MIPs were obtained to evaluate the vascular anatomy. Carotid stenosis measurements (when applicable) are obtained utilizing NASCET criteria, using the distal internal carotid diameter as the denominator.  CONTRAST:  135mL OMNIPAQUE IOHEXOL 350 MG/ML SOLN  COMPARISON:  None.  FINDINGS: CT HEAD  Brain: No acute infarct, hemorrhage, or mass lesion is present. The ventricles are of normal size. No significant extraaxial fluid collection is present.  Calvarium and skull base: Negative  Paranasal sinuses: Clear  Orbits: Within normal limits  CTA NECK  Aortic arch: A 3 vessel arch configuration is present. There is mild tortuosity of the innominate artery without  significant stenosis.  Right carotid system: The right common carotid artery is within normal limits. The bifurcation is unremarkable. There is mild tortuosity of the cervical right ICA without a significant stenosis.  Left carotid system: The left common carotid artery is within normal limits. Bifurcation is unremarkable. There is mild tortuosity of the cervical left ICA without a significant stenosis.  Vertebral arteries:The proximal vertebral arteries are poorly visualized due to beam hardening artifact. The both originate from the subclavian arteries. The left vertebral artery is slightly dominant to the right. There is no significant stenosis in the neck.  Skeleton: There is some straightening of the normal cervical lordosis without focal lytic or blastic lesions. Alignment is anatomic.  Other neck: No other focal soft tissue lesion is present. The salivary glands are within normal limits. Mucosal  surfaces are normal. Thyroid is unremarkable. There is no significant adenopathy.  CTA HEAD  Anterior circulation: Cavernous internal carotid arteries are somewhat difficult to distinguish from the sinus given the late phase of imaging. The terminal ICA is normal bilaterally. The A1 and M1 segments are normal. The anterior communicating artery is patent. The right A1 segment is dominant. The MCA bifurcations are within normal limits. The ACA and MCA branch vessels are unremarkable.  Posterior circulation: The vertebral arteries are codominant. The left PICA origin is visualized and normal. The a right PICA is not seen. The basilar artery is within normal limits. Of post cerebral arteries originate from the basilar tip. The PCA branch vessels are within normal limits bilaterally.  Venous sinuses: The dural sinuses are patent. The left transverse sinus is dominant.  Anatomic variants: None  Delayed phase: Postcontrast images of the brain demonstrate no pathologic enhancement.  IMPRESSION: 1. Mild tortuosity of the cervical vasculature compatible with a history of hypertension. 2. No significant stenosis or occlusion. 3. No evidence for acute infarct.   Electronically Signed   By: San Morelle M.D.   On: 02/26/2015 20:22   Ct Angio Chest Aorta W/cm &/or Wo/cm  02/26/2015   CLINICAL DATA:  39 year old female presenting with sudden onset of headache and chest heaviness, with some associated shortness of breath. History of hypertension and diabetes. Elevated blood pressure.  EXAM: CT ANGIOGRAPHY CHEST WITH CONTRAST  TECHNIQUE: Multidetector CT imaging of the chest was performed using the standard protocol during bolus administration of intravenous contrast. Multiplanar CT image reconstructions and MIPs were obtained to evaluate the vascular anatomy.  CONTRAST:  175mL OMNIPAQUE IOHEXOL 350 MG/ML SOLN  COMPARISON:  No priors.  FINDINGS: Mediastinum/Lymph Nodes: No crescentic high attenuation associated with the  wall of the thoracic aorta on precontrast images to indicate acute intramural hemorrhage. Large amount of pulsation artifact slightly limits the examination. With these limitations in mind, there is no definite evidence of thoracic aortic dissection. No thoracic aortic aneurysm. Ascending thoracic aorta, arch, and descending thoracic aorta measure 3.3 cm, 2.5 cm and 2.2 cm in diameter respectively. Heart size is normal. There is no significant pericardial fluid, thickening or pericardial calcification. Importantly, however, there is a 1.6 x 3.0 cm mass like lesion in the left atrium which appears intimately associated with the posterior aspect of the anterior leaflet of the mitral valve. On precontrast images this measures approximately 51 HU, as compared with the blood pool which is approximately 68-73 HU. Esophagus is normal in appearance. No pathologically enlarged mediastinal or hilar lymph nodes. No axillary lymphadenopathy.  Lungs/Pleura: No pneumothorax. No acute consolidative airspace disease. No pleural effusions. No suspicious appearing pulmonary nodules  or masses.  Upper Abdomen: Unremarkable.  Musculoskeletal/Soft Tissues: There are no aggressive appearing lytic or blastic lesions noted in the visualized portions of the skeleton.  Review of the MIP images confirms the above findings.  IMPRESSION: 1. No evidence of aneurysm, dissection or other acute aortic syndrome of the thoracic aorta. 2. However, there is a 1.6 x 3.0 cm mass-like lesion in the left atrium which appears intimately associated with the posterior aspect of the anterior leaflet of the mitral valve. 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 Other differential consideration would include a vegetation of the mitral valve, or a large papillary fibroelastoma (not strongly favored). Correlation with echocardiography is strongly recommended. These results were called by  telephone at the time of interpretation on 02/26/2015 at 8:23 pm to Dr. Charlesetta Shanks, who verbally acknowledged these results.   Electronically Signed   By: Vinnie Langton M.D.   On: 02/26/2015 20:26    Cardiac Studies:  ECG:  NSR no acute ST changes rate 78 normal    Telemetry: NSR no arrhythmia  Echo: pending   Medications:   . sodium chloride   Intravenous STAT  . heparin  5,000 Units Subcutaneous 3 times per day  . insulin aspart  0-5 Units Subcutaneous QHS  . insulin aspart  0-9 Units Subcutaneous TID WC  . sodium chloride  3 mL Intravenous Q12H     . sodium chloride Stopped (02/27/15 0200)    Assessment/Plan:  Headache:  CT/CTA normal improved no evidence of embolus Chest Pain:  Troponin .44  ECG normal CT suggesting LA myxoma  Echo today  If she has a significant myxoma Will need TEE and cath on Monday with possible plans for resection if large continue heparin   Jenkins Rouge 02/27/2015, 8:55 AM

## 2015-02-28 LAB — GLUCOSE, CAPILLARY
Glucose-Capillary: 100 mg/dL — ABNORMAL HIGH (ref 70–99)
Glucose-Capillary: 112 mg/dL — ABNORMAL HIGH (ref 70–99)
Glucose-Capillary: 111 mg/dL — ABNORMAL HIGH (ref 70–99)
Glucose-Capillary: 99 mg/dL (ref 70–99)

## 2015-02-28 MED ORDER — SODIUM CHLORIDE 0.9 % IJ SOLN: 3.0000 mL | INTRAMUSCULAR | Status: DC | PRN

## 2015-02-28 MED ORDER — POLYETHYLENE GLYCOL 3350 17 G PO PACK
17.0000 g | PACK | Freq: Every day | ORAL | Status: DC
  Administered 2015-02-28 – 2015-03-03 (×2): 17 g via ORAL
  Filled 2015-02-28 (×5): qty 1

## 2015-02-28 MED ORDER — SODIUM CHLORIDE 0.9 % IV SOLN: 250.0000 mL | INTRAVENOUS | Status: DC | PRN

## 2015-02-28 MED ORDER — ASPIRIN 81 MG PO CHEW
81.0000 mg | CHEWABLE_TABLET | ORAL | Status: AC
  Administered 2015-03-01: 81 mg via ORAL
  Filled 2015-02-28: qty 1

## 2015-02-28 MED ORDER — POLYETHYLENE GLYCOL 3350 17 GM/SCOOP PO POWD
1.0000 | Freq: Every day | ORAL | Status: DC
  Filled 2015-02-28: qty 255

## 2015-02-28 MED ORDER — DOCUSATE SODIUM 100 MG PO CAPS: 100.0000 mg | ORAL_CAPSULE | Freq: Two times a day (BID) | ORAL | Status: DC | PRN

## 2015-02-28 MED ORDER — GUAIFENESIN-CODEINE 100-10 MG/5ML PO SOLN
5.0000 mL | ORAL | Status: DC | PRN
  Administered 2015-02-28 – 2015-03-03 (×6): 5 mL via ORAL
  Filled 2015-02-28 (×6): qty 5

## 2015-02-28 MED ORDER — SODIUM CHLORIDE 0.9 % IJ SOLN
3.0000 mL | Freq: Two times a day (BID) | INTRAMUSCULAR | Status: DC
  Administered 2015-02-28 – 2015-03-01 (×2): 3 mL via INTRAVENOUS

## 2015-02-28 NOTE — Progress Notes (Signed)
Telephone spanish interpreter utilized for MD visit and explanation of plan of care. Pt questions were answered and follow up teaching done with patient and family throughout the day regarding plans for TEE and cardiac cath tomorrow and heart surgery within the next week. Social work consult placed as patient would like to try and get her mother her from [place] to help her. Patient's husband and older children encouraged to provide emotional support for patient as well as help around the house and caring for the younger children per patient request. They agree to help.

## 2015-02-28 NOTE — Progress Notes (Signed)
Patient ID: [name], female   DOB: Jun 16, 1976, 39 y.o.   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   She would like her mother from [place] ( who has paper) to come for surgery and help care for children This is appropriate and this note will be given to patient to facilitate process  Objective:  Filed Vitals:   02/27/15 0640 02/27/15 1750 02/27/15 2057 02/28/15 0414  BP: 99/65 116/74 119/74 110/69  Pulse: 74 63 64 60  Temp: 99.1 F (37.3 C) 98.9 F (37.2 C) 98.2 F (36.8 C) 98.8 F (37.1 C)  TempSrc: Oral Oral Oral Oral  Resp: 18 17 18 16   Height:      Weight:      SpO2: 98% 99% 97% 100%    Intake/Output from previous day:  Intake/Output Summary (Last 24 hours) at 02/28/15 9735 Last data filed at 02/27/15 1300  Gross per 24 hour  Intake    480 ml  Output      0 ml  Net    480 ml    Physical Exam: Affect appropriate Obese hispanic female  HEENT: normal Neck supple with no adenopathy JVP normal no bruits no thyromegaly Lungs clear with no wheezing and good diaphragmatic motion Heart:  S1/S2 no murmur, no rub, gallop or click PMI normal Abdomen: benighn, BS positve, no tenderness, no AAA no bruit.  No HSM or HJR Distal pulses intact with no bruits No edema Neuro non-focal Skin warm and dry No muscular weakness   Lab Results: Basic Metabolic Panel:  Recent Labs  02/26/15 1546 02/27/15 0330  NA 137 140  K 3.3* 3.4*  CL 102 107  CO2 25 24  GLUCOSE 99 103*  BUN 13 8  CREATININE 0.65 0.54  CALCIUM 9.1 8.2*  MG  --  2.0  PHOS  --  4.0   Liver Function Tests:  Recent Labs  02/26/15 1546 02/27/15 0330  AST 27 34  ALT 26 22  ALKPHOS 81 66  BILITOT 0.3 0.3  PROT 7.7 6.6  ALBUMIN 4.1 3.1*    CBC:  Recent Labs  02/26/15 1546 02/27/15 0330  WBC 12.0* 10.9*  NEUTROABS 8.1*  --   HGB 12.6 11.4*  HCT 40.3 35.9*  MCV 80.9 81.6  PLT 334 295   Cardiac Enzymes:  Recent Labs  02/27/15 0736 02/27/15 1257 02/27/15 1944  TROPONINI 2.36* 1.63* 1.24*    Imaging: Ct Angio Head W/cm &/or Wo Cm  02/26/2015   CLINICAL DATA:  Headache.  Hypertension.  EXAM: CT ANGIOGRAPHY HEAD AND NECK  TECHNIQUE: Multidetector CT imaging of the head and neck was performed using the standard protocol during bolus administration of intravenous contrast. Multiplanar CT image reconstructions and MIPs were obtained to evaluate the vascular anatomy. Carotid stenosis measurements (when applicable) are obtained utilizing NASCET criteria, using the distal internal carotid diameter as the denominator.  CONTRAST:  120mL OMNIPAQUE IOHEXOL 350 MG/ML SOLN  COMPARISON:  None.  FINDINGS: CT HEAD  Brain: No acute infarct, hemorrhage, or mass lesion is present. The ventricles are of normal size. No significant extraaxial fluid collection is present.  Calvarium and skull base: Negative  Paranasal sinuses: Clear  Orbits: Within normal limits  CTA NECK  Aortic arch: A 3 vessel  arch configuration is present. There is mild tortuosity of the innominate artery without significant stenosis.  Right carotid system: The right common carotid artery is within normal limits. The bifurcation is unremarkable. There is mild tortuosity of the cervical right ICA without a significant stenosis.  Left carotid system: The left common carotid artery is within normal limits. Bifurcation is unremarkable. There is mild tortuosity of the cervical left ICA without a significant stenosis.  Vertebral arteries:The proximal vertebral arteries are poorly visualized due to beam hardening artifact. The both originate from the subclavian arteries. The left vertebral artery is slightly dominant to the right. There is no significant stenosis in the neck.  Skeleton:  There is some straightening of the normal cervical lordosis without focal lytic or blastic lesions. Alignment is anatomic.  Other neck: No other focal soft tissue lesion is present. The salivary glands are within normal limits. Mucosal surfaces are normal. Thyroid is unremarkable. There is no significant adenopathy.  CTA HEAD  Anterior circulation: Cavernous internal carotid arteries are somewhat difficult to distinguish from the sinus given the late phase of imaging. The terminal ICA is normal bilaterally. The A1 and M1 segments are normal. The anterior communicating artery is patent. The right A1 segment is dominant. The MCA bifurcations are within normal limits. The ACA and MCA branch vessels are unremarkable.  Posterior circulation: The vertebral arteries are codominant. The left PICA origin is visualized and normal. The a right PICA is not seen. The basilar artery is within normal limits. Of post cerebral arteries originate from the basilar tip. The PCA branch vessels are within normal limits bilaterally.  Venous sinuses: The dural sinuses are patent. The left transverse sinus is dominant.  Anatomic variants: None  Delayed phase: Postcontrast images of the brain demonstrate no pathologic enhancement.  IMPRESSION: 1. Mild tortuosity of the cervical vasculature compatible with a history of hypertension. 2. No significant stenosis or occlusion. 3. No evidence for acute infarct.   Electronically Signed   By: San Morelle M.D.   On: 02/26/2015 20:22   Dg Chest 2 View  02/26/2015   CLINICAL DATA:  Shortness of breath and chest heaviness  EXAM: CHEST  2 VIEW  COMPARISON:  07/31/2014  FINDINGS: Cardiac shadow is within normal limits. The lungs are clear bilaterally. No acute bony abnormality is seen.  IMPRESSION: No active cardiopulmonary disease.   Electronically Signed   By: Inez Catalina M.D.   On: 02/26/2015 16:27   Ct Head Wo Contrast  02/26/2015   CLINICAL DATA:  Headache  EXAM: CT HEAD WITHOUT  CONTRAST  TECHNIQUE: Contiguous axial images were obtained from the base of the skull through the vertex without intravenous contrast.  COMPARISON:  None.  FINDINGS: Ventricle size is normal. Negative for acute or chronic infarction. Negative for hemorrhage or fluid collection. Negative for mass or edema. No shift of the midline structures.  Calvarium is intact.  IMPRESSION: Normal   Electronically Signed   By: Franchot Gallo M.D.   On: 02/26/2015 15:44   Ct Angio Neck W/cm &/or Wo/cm  02/26/2015   CLINICAL DATA:  Headache.  Hypertension.  EXAM: CT ANGIOGRAPHY HEAD AND NECK  TECHNIQUE: Multidetector CT imaging of the head and neck was performed using the standard protocol during bolus administration of intravenous contrast. Multiplanar CT image reconstructions and MIPs were obtained to evaluate the vascular anatomy. Carotid stenosis measurements (when applicable) are obtained utilizing NASCET criteria, using the distal internal carotid diameter as the denominator.  CONTRAST:  134mL OMNIPAQUE IOHEXOL  350 MG/ML SOLN  COMPARISON:  None.  FINDINGS: CT HEAD  Brain: No acute infarct, hemorrhage, or mass lesion is present. The ventricles are of normal size. No significant extraaxial fluid collection is present.  Calvarium and skull base: Negative  Paranasal sinuses: Clear  Orbits: Within normal limits  CTA NECK  Aortic arch: A 3 vessel arch configuration is present. There is mild tortuosity of the innominate artery without significant stenosis.  Right carotid system: The right common carotid artery is within normal limits. The bifurcation is unremarkable. There is mild tortuosity of the cervical right ICA without a significant stenosis.  Left carotid system: The left common carotid artery is within normal limits. Bifurcation is unremarkable. There is mild tortuosity of the cervical left ICA without a significant stenosis.  Vertebral arteries:The proximal vertebral arteries are poorly visualized due to beam hardening  artifact. The both originate from the subclavian arteries. The left vertebral artery is slightly dominant to the right. There is no significant stenosis in the neck.  Skeleton: There is some straightening of the normal cervical lordosis without focal lytic or blastic lesions. Alignment is anatomic.  Other neck: No other focal soft tissue lesion is present. The salivary glands are within normal limits. Mucosal surfaces are normal. Thyroid is unremarkable. There is no significant adenopathy.  CTA HEAD  Anterior circulation: Cavernous internal carotid arteries are somewhat difficult to distinguish from the sinus given the late phase of imaging. The terminal ICA is normal bilaterally. The A1 and M1 segments are normal. The anterior communicating artery is patent. The right A1 segment is dominant. The MCA bifurcations are within normal limits. The ACA and MCA branch vessels are unremarkable.  Posterior circulation: The vertebral arteries are codominant. The left PICA origin is visualized and normal. The a right PICA is not seen. The basilar artery is within normal limits. Of post cerebral arteries originate from the basilar tip. The PCA branch vessels are within normal limits bilaterally.  Venous sinuses: The dural sinuses are patent. The left transverse sinus is dominant.  Anatomic variants: None  Delayed phase: Postcontrast images of the brain demonstrate no pathologic enhancement.  IMPRESSION: 1. Mild tortuosity of the cervical vasculature compatible with a history of hypertension. 2. No significant stenosis or occlusion. 3. No evidence for acute infarct.   Electronically Signed   By: San Morelle M.D.   On: 02/26/2015 20:22   Ct Angio Chest Aorta W/cm &/or Wo/cm  02/26/2015   CLINICAL DATA:  39 year old female presenting with sudden onset of headache and chest heaviness, with some associated shortness of breath. History of hypertension and diabetes. Elevated blood pressure.  EXAM: CT ANGIOGRAPHY CHEST WITH  CONTRAST  TECHNIQUE: Multidetector CT imaging of the chest was performed using the standard protocol during bolus administration of intravenous contrast. Multiplanar CT image reconstructions and MIPs were obtained to evaluate the vascular anatomy.  CONTRAST:  161mL OMNIPAQUE IOHEXOL 350 MG/ML SOLN  COMPARISON:  No priors.  FINDINGS: Mediastinum/Lymph Nodes: No crescentic high attenuation associated with the wall of the thoracic aorta on precontrast images to indicate acute intramural hemorrhage. Large amount of pulsation artifact slightly limits the examination. With these limitations in mind, there is no definite evidence of thoracic aortic dissection. No thoracic aortic aneurysm. Ascending thoracic aorta, arch, and descending thoracic aorta measure 3.3 cm, 2.5 cm and 2.2 cm in diameter respectively. Heart size is normal. There is no significant pericardial fluid, thickening or pericardial calcification. Importantly, however, there is a 1.6 x 3.0 cm mass like lesion  in the left atrium which appears intimately associated with the posterior aspect of the anterior leaflet of the mitral valve. On precontrast images this measures approximately 51 HU, as compared with the blood pool which is approximately 68-73 HU. Esophagus is normal in appearance. No pathologically enlarged mediastinal or hilar lymph nodes. No axillary lymphadenopathy.  Lungs/Pleura: No pneumothorax. No acute consolidative airspace disease. No pleural effusions. No suspicious appearing pulmonary nodules or masses.  Upper Abdomen: Unremarkable.  Musculoskeletal/Soft Tissues: There are no aggressive appearing lytic or blastic lesions noted in the visualized portions of the skeleton.  Review of the MIP images confirms the above findings.  IMPRESSION: 1. No evidence of aneurysm, dissection or other acute aortic syndrome of the thoracic aorta. 2. However, there is a 1.6 x 3.0 cm mass-like lesion in the left atrium which appears intimately associated with  the posterior aspect of the anterior leaflet of the mitral valve. 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 Other differential consideration would include a vegetation of the mitral valve, or a large papillary fibroelastoma (not strongly favored). Correlation with echocardiography is strongly recommended. These results were called by telephone at the time of interpretation on 02/26/2015 at 8:23 pm to Dr. Charlesetta Shanks, who verbally acknowledged these results.   Electronically Signed   By: Vinnie Langton M.D.   On: 02/26/2015 20:26    Cardiac Studies:  ECG:  NSR no acute ST changes rate 78 normal    Telemetry: NSR no arrhythmia  Echo: pending   Medications:   . heparin  5,000 Units Subcutaneous 3 times per day  . insulin aspart  0-5 Units Subcutaneous QHS  . insulin aspart  0-9 Units Subcutaneous TID WC  . sodium chloride  3 mL Intravenous Q12H     . 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

## 2015-02-28 NOTE — Progress Notes (Signed)
Utilization Review Completed.   Hope Brandenburger, RN, BSN Nurse Case Manager  

## 2015-03-01 ENCOUNTER — Encounter (HOSPITAL_COMMUNITY)
Admission: EM | Disposition: A | Discharge status: Home / Self Care | Payer: Self-pay | Source: Home / Self Care | Attending: Internal Medicine

## 2015-03-01 ENCOUNTER — Other Ambulatory Visit: Payer: Self-pay | Admitting: *Deleted

## 2015-03-01 ENCOUNTER — Encounter (HOSPITAL_COMMUNITY): Payer: Self-pay

## 2015-03-01 DIAGNOSIS — D489 Neoplasm of uncertain behavior, unspecified: Secondary | ICD-10-CM

## 2015-03-01 DIAGNOSIS — G4489 Other headache syndrome: Secondary | ICD-10-CM

## 2015-03-01 DIAGNOSIS — D219 Benign neoplasm of connective and other soft tissue, unspecified: Secondary | ICD-10-CM

## 2015-03-01 HISTORY — PX: TEE WITHOUT CARDIOVERSION: SHX5443

## 2015-03-01 LAB — BASIC METABOLIC PANEL
Anion gap: 3 — ABNORMAL LOW (ref 5–15)
BUN: 8 mg/dL (ref 6–23)
CO2: 29 mmol/L (ref 19–32)
Calcium: 8.5 mg/dL (ref 8.4–10.5)
Chloride: 106 mmol/L (ref 96–112)
Creatinine, Ser: 0.56 mg/dL (ref 0.50–1.10)
GFR calc Af Amer: >90 mL/min (ref >90)
GFR calc non Af Amer: >90 mL/min (ref >90)
Glucose, Bld: 112 mg/dL — ABNORMAL HIGH (ref 70–99)
Potassium: 3.9 mmol/L (ref 3.5–5.1)
Sodium: 138 mmol/L (ref 135–145)

## 2015-03-01 LAB — GLUCOSE, CAPILLARY
Glucose-Capillary: 119 mg/dL — ABNORMAL HIGH (ref 70–99)
Glucose-Capillary: 104 mg/dL — ABNORMAL HIGH (ref 70–99)
Glucose-Capillary: 93 mg/dL (ref 70–99)
Glucose-Capillary: 100 mg/dL — ABNORMAL HIGH (ref 70–99)

## 2015-03-01 LAB — HEMOGLOBIN A1C
Hgb A1c MFr Bld: 6.3 % — ABNORMAL HIGH (ref 4.8–5.6)
Mean Plasma Glucose: 134 mg/dL

## 2015-03-01 LAB — PROTIME-INR
INR: 1.09 (ref 0.00–1.49)
Prothrombin Time: 14.3 seconds (ref 11.6–15.2)

## 2015-03-01 SURGERY — ECHOCARDIOGRAM, TRANSESOPHAGEAL
Anesthesia: Moderate Sedation

## 2015-03-01 MED ORDER — MIDAZOLAM HCL 5 MG/ML IJ SOLN
INTRAMUSCULAR | Status: AC
  Filled 2015-03-01: qty 2

## 2015-03-01 MED ORDER — SODIUM CHLORIDE 0.9 % IV SOLN
INTRAVENOUS | Status: DC
  Administered 2015-03-01: 17:00:00 via INTRAVENOUS

## 2015-03-01 MED ORDER — DIPHENHYDRAMINE HCL 50 MG/ML IJ SOLN
INTRAMUSCULAR | Status: AC
  Filled 2015-03-01: qty 1

## 2015-03-01 MED ORDER — FENTANYL CITRATE 0.05 MG/ML IJ SOLN
INTRAMUSCULAR | Status: AC
  Filled 2015-03-01: qty 2

## 2015-03-01 MED ORDER — MIDAZOLAM HCL 10 MG/2ML IJ SOLN
INTRAMUSCULAR | Status: DC | PRN
  Administered 2015-03-01 (×2): 2 mg via INTRAVENOUS

## 2015-03-01 MED ORDER — BUTAMBEN-TETRACAINE-BENZOCAINE 2-2-14 % EX AERO
INHALATION_SPRAY | CUTANEOUS | Status: DC | PRN
  Administered 2015-03-01: 2 via TOPICAL

## 2015-03-01 MED ORDER — FENTANYL CITRATE 0.05 MG/ML IJ SOLN
INTRAMUSCULAR | Status: DC | PRN
  Administered 2015-03-01 (×2): 25 ug via INTRAVENOUS

## 2015-03-01 NOTE — Consult Note (Signed)
Consult Reason for Consult: headache and nausea Referring Physician: Dr Tamala Julian Cardiology  CC: headache  HPI: Debra Diaz is an 39 y.o. female history of hypertension and DM admitted with chest pain. 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 Scheduled for TEE. Neurology consulted for question of possible embolic etiology of headache. She has tentative plans for a catheterization and TEE.   Headache described as frontal, pounding, associated nausea and photophobia. No visual changes. No focal motor or sensory changes. At its worst is a 6/10. Has history of headaches in the past but none this severe.   CT head imaging reviewed, shows no acute process.   Past Medical History  Diagnosis Date  . Hypertension   . Diabetes mellitus without complication     Past Surgical History  Procedure Laterality Date  . Cesarean section  1999 and 2011    x 2  . Tubal ligation  2011    Family History  Problem Relation Age of Onset  . Hypertension Mother   . Diabetes Father     Social History:  reports that she has never smoked. She does not have any smokeless tobacco history on file. She reports that she drinks alcohol. She reports that she does not use illicit drugs.  No Known Allergies  Medications:  Scheduled: . heparin  5,000 Units Subcutaneous 3 times per day  . insulin aspart  0-5 Units Subcutaneous QHS  . insulin aspart  0-9 Units Subcutaneous TID WC  . polyethylene glycol  17 g Oral Daily  . sodium chloride  3 mL Intravenous Q12H  . sodium chloride  3 mL Intravenous Q12H    ROS: Out of a complete 14 system review, the patient complains of only the following symptoms, and all other reviewed systems are negative. +headache, anxiety  Physical Examination: Filed Vitals:   03/01/15 0359  BP: 106/55  Pulse: 65  Temp: 98.8 F (37.1 C)  Resp: 19   Physical Exam  Constitutional: He appears well-developed and well-nourished.  Psych: Affect appropriate to  situation Eyes: No scleral injection HENT: No OP obstrucion Head: Normocephalic.  Cardiovascular: Normal rate and regular rhythm.  Respiratory: Effort normal and breath sounds normal.  GI: Soft. Bowel sounds are normal. No distension. There is no tenderness.  Skin: WDI  Neurologic Examination Mental Status: Alert, oriented, thought content appropriate.  Speech fluent without evidence of aphasia.  Able to follow 3 step commands without difficulty. Cranial Nerves: II: unable to visualize discs, visual fields grossly normal, pupils equal, round, reactive to light and accommodation III,IV, VI: ptosis not present, extra-ocular motions intact bilaterally V,VII: smile symmetric, facial light touch sensation normal bilaterally VIII: hearing normal bilaterally IX,X: gag reflex present XI: trapezius strength/neck flexion strength normal bilaterally XII: tongue strength normal  Motor: Right : Upper extremity    Left:     Upper extremity 5/5 deltoid       5/5 deltoid 5/5 biceps      5/5 biceps  5/5 triceps      5/5 triceps 5/5 hand grip      5/5 hand grip  Lower extremity     Lower extremity 5/5 hip flexor      5/5 hip flexor 5/5 quadricep      5/5 quadriceps  5/5 hamstrings     5/5 hamstrings 5/5 plantar flexion       5/5 plantar flexion 5/5 plantar extension     5/5 plantar extension Tone and bulk:normal tone throughout; no atrophy noted Sensory: Pinprick and  light touch intact throughout, bilaterally Deep Tendon Reflexes: 2+ and symmetric throughout Plantars: Right: downgoing   Left: downgoing Cerebellar: normal finger-to-nose, and normal heel-to-shin test Gait: deferred  Laboratory Studies:   Basic Metabolic Panel:  Recent Labs Lab 02/26/15 1546 02/27/15 0330 03/01/15 0520  NA 137 140 138  K 3.3* 3.4* 3.9  CL 102 107 106  CO2 25 24 29   GLUCOSE 99 103* 112*  BUN 13 8 8   CREATININE 0.65 0.54 0.56  CALCIUM 9.1 8.2* 8.5  MG  --  2.0  --   PHOS  --  4.0  --      Liver Function Tests:  Recent Labs Lab 02/26/15 1546 02/27/15 0330  AST 27 34  ALT 26 22  ALKPHOS 81 66  BILITOT 0.3 0.3  PROT 7.7 6.6  ALBUMIN 4.1 3.1*   No results for input(s): LIPASE, AMYLASE in the last 168 hours. No results for input(s): AMMONIA in the last 168 hours.  CBC:  Recent Labs Lab 02/26/15 1546 02/27/15 0330  WBC 12.0* 10.9*  NEUTROABS 8.1*  --   HGB 12.6 11.4*  HCT 40.3 35.9*  MCV 80.9 81.6  PLT 334 295    Cardiac Enzymes:  Recent Labs Lab 02/26/15 1546 02/27/15 0736 02/27/15 1257 02/27/15 1944  TROPONINI 0.44* 2.36* 1.63* 1.24*    BNP: Invalid input(s): POCBNP  CBG:  Recent Labs Lab 02/28/15 1118 02/28/15 1629 02/28/15 2147 03/01/15 0648 03/01/15 1129  GLUCAP 112* 111* 99 119* 104*    Microbiology: No results found for this or any previous visit.  Coagulation Studies:  Recent Labs  03/01/15 0520  LABPROT 14.3  INR 1.09    Urinalysis:  Recent Labs Lab 02/26/15 1405  COLORURINE YELLOW  LABSPEC 1.005  PHURINE 7.0  GLUCOSEU NEGATIVE  HGBUR TRACE*  BILIRUBINUR NEGATIVE  KETONESUR NEGATIVE  PROTEINUR NEGATIVE  UROBILINOGEN 0.2  NITRITE NEGATIVE  LEUKOCYTESUR NEGATIVE    Lipid Panel:  No results found for: CHOL, TRIG, HDL, CHOLHDL, VLDL, LDLCALC  HgbA1C:  Lab Results  Component Value Date   HGBA1C 6.3* 02/27/2015    Urine Drug Screen:  No results found for: LABOPIA, COCAINSCRNUR, LABBENZ, AMPHETMU, THCU, LABBARB  Alcohol Level: No results for input(s): ETH in the last 168 hours.  Other results:  Imaging: No results found.   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 Neurological exam is unremarkable. Suspect headache is likely migraine headache. With history of left atrial mass cannot rule out an embolic etiology so it would be prudent to proceed with MRI brain.  -check MRI brain without contrast -benadryl  12.5mg  IV and reglan 10mg  IV PRN q8hrs for symptomatic relief of headache -will follow up   Jim Like, DO [facility] [phone]  If 7pm- 7am, please page neurology on call as listed in [place]. 03/01/2015, 12:33 PM

## 2015-03-01 NOTE — H&P (Addendum)
ADMISSION H&P NOTE  Reason for Consult: Headache, Chest pain, cough, elevated troponin  Requesting Physician: Dr. Johnney Killian  Cardiologist: None (NEW)  HPI: This is a 39 y.o. female with a past medical history significant for hypertension, diabetes and family history of the same. She presents today with sudden onset of frontal temporal headache and then central chest pressure. She had some brief left arm numbness and felt some shortness of breath. She apparently responded to ibuprofen or aspirin. She is also had a chronic cough for about a month. Her initial substernal chest pressure was rated as 7 out of 10 and improved to a 3 out of 10 currently. The pain did not radiate to her back or have a tearing sensation. Laboratory work reveals an elevated white blood cell count of 12,000 with an elevated troponin I of 0.44. Potassium slightly low at 3.3. Head CT was negative for acute process. Chest x-ray is negative. EKG shows sinus rhythm with no ischemic changes.  PMHx:  Past Medical History  Diagnosis Date  . Hypertension   . Diabetes mellitus without complication    Past Surgical History  Procedure Laterality Date  . Cesarean section  1999 and 2011    x 2  . Tubal ligation  2011    FAMHx: Family History  Problem Relation Age of Onset  . Hypertension Mother   . Diabetes Father     SOCHx:  reports that she has never smoked. She does not have any smokeless tobacco history on file. She reports that she drinks alcohol. She reports that she does not use illicit drugs.  ALLERGIES: No Known Allergies  ROS: A comprehensive review of systems was negative except for: Respiratory: positive for cough and dyspnea on exertion Cardiovascular: positive for chest pain Neurological: positive for headaches  HOME MEDICATIONS:   Medication List    ASK your doctor about these medications        HYDROcodone-acetaminophen 5-325 MG per tablet  Commonly known as:  NORCO/VICODIN  1 or 2  tabs PO q6 hours prn pain     ibuprofen 200 MG tablet  Commonly known as:  ADVIL,MOTRIN  Take 800 mg by mouth once as needed for mild pain or moderate pain.     lisinopril-hydrochlorothiazide 20-25 MG per tablet  Commonly known as:  PRINZIDE,ZESTORETIC  Take 1 tablet by mouth daily.     metFORMIN 500 MG tablet  Commonly known as:  GLUCOPHAGE  Take 500 mg by mouth 2 (two) times daily.     methocarbamol 500 MG tablet  Commonly known as:  ROBAXIN  Take 2 tablets (1,000 mg total) by mouth 4 (four) times daily as needed for muscle spasms (muscle spasm/pain).     multivitamin with minerals Tabs tablet  Take 1 tablet by mouth daily.       HOSPITAL MEDICATIONS: None  VITALS: Blood pressure 106/66, pulse 64, temperature 99.2 F (37.3 C), temperature source Oral, resp. rate 18, height _0  (1.549 m), weight 214 lb 8.1 oz (97.3 kg), last menstrual period 02/19/2015, SpO2 98 %.  PHYSICAL EXAM: General appearance: alert, no distress and Persistent dry nonproductive cough Neck: no carotid bruit and no JVD Lungs: clear to auscultation bilaterally Heart: regular rate and rhythm, S1, S2 normal, no murmur, click, rub or gallop Abdomen: soft, non-tender; bowel sounds normal; no masses,  no organomegaly and Obese Extremities: extremities normal, atraumatic, no cyanosis or edema Pulses: 2+ and symmetric Skin: Skin color, texture, turgor normal. No rashes or lesions Neurologic: Grossly  normal Psych: Pleasant   LABS: Results for orders placed or performed during the hospital encounter of 02/26/15 (from the past 48 hour(s))  Glucose, capillary     Status: Abnormal   Collection Time: 02/27/15  4:35 PM  Result Value Ref Range   Glucose-Capillary 112 (H) 70 - 99 mg/dL  Troponin I (q 6hr x 3)     Status: Abnormal   Collection Time: 02/27/15  7:44 PM  Result Value Ref Range   Troponin I 1.24 (HH) <0.031 ng/mL    Comment:        POSSIBLE MYOCARDIAL ISCHEMIA. SERIAL  TESTING RECOMMENDED. REPEATED TO VERIFY CRITICAL VALUE NOTED.  VALUE IS CONSISTENT WITH PREVIOUSLY REPORTED AND CALLED VALUE.   Glucose, capillary     Status: Abnormal   Collection Time: 02/27/15  9:50 PM  Result Value Ref Range   Glucose-Capillary 114 (H) 70 - 99 mg/dL  Glucose, capillary     Status: Abnormal   Collection Time: 02/28/15  6:46 AM  Result Value Ref Range   Glucose-Capillary 100 (H) 70 - 99 mg/dL  Glucose, capillary     Status: Abnormal   Collection Time: 02/28/15 11:18 AM  Result Value Ref Range   Glucose-Capillary 112 (H) 70 - 99 mg/dL  Glucose, capillary     Status: Abnormal   Collection Time: 02/28/15  4:29 PM  Result Value Ref Range   Glucose-Capillary 111 (H) 70 - 99 mg/dL  Glucose, capillary     Status: None   Collection Time: 02/28/15  9:47 PM  Result Value Ref Range   Glucose-Capillary 99 70 - 99 mg/dL   Comment 1 Notify RN    Comment 2 Documented in Char   Protime-INR     Status: None   Collection Time: 03/01/15  5:20 AM  Result Value Ref Range   Prothrombin Time 14.3 11.6 - 15.2 seconds   INR 1.09 0.00 - 0.26  Basic metabolic panel     Status: Abnormal   Collection Time: 03/01/15  5:20 AM  Result Value Ref Range   Sodium 138 135 - 145 mmol/L   Potassium 3.9 3.5 - 5.1 mmol/L   Chloride 106 96 - 112 mmol/L   CO2 29 19 - 32 mmol/L   Glucose, Bld 112 (H) 70 - 99 mg/dL   BUN 8 6 - 23 mg/dL   Creatinine, Ser 0.56 0.50 - 1.10 mg/dL   Calcium 8.5 8.4 - 10.5 mg/dL   GFR calc non Af Amer >90 >90 mL/min   GFR calc Af Amer >90 >90 mL/min    Comment: (NOTE) The eGFR has been calculated using the CKD EPI equation. This calculation has not been validated in all clinical situations. eGFR's persistently <90 mL/min signify possible Chronic Kidney Disease.    Anion gap 3 (L) 5 - 15  Glucose, capillary     Status: Abnormal   Collection Time: 03/01/15  6:48 AM  Result Value Ref Range   Glucose-Capillary 119 (H) 70 - 99 mg/dL   Comment 1 Notify RN     Comment 2 Documented in Char   Glucose, capillary     Status: Abnormal   Collection Time: 03/01/15 11:29 AM  Result Value Ref Range   Glucose-Capillary 104 (H) 70 - 99 mg/dL   Comment 1 Notify RN     IMAGING: No results found.  HOSPITAL DIAGNOSES: Principal Problem:   Cardiac mass Active Problems:   Chest pain   Headache   Elevated troponin I level   HTN (hypertension)  DM2 (diabetes mellitus, type 2)   Cardiac tumor, atrial   Chest pain with high risk of acute coronary syndrome   IMPRESSION: 1. Chest pain- Debra Diaz is describing chest pain which was of sudden onset and associated with headache. She does not have a history of headaches in the past. This headache seems to have improved with ibuprofen or aspirin in her chest pain has as well. I wonder how the two are related. CT is negative for acute bleed however we do know that acute CNS events can cause chest pain. They also can cause troponin elevation. She may ultimately need an MRI and good neurologic evaluation. From a cardiac standpoint, her EKG is normal. She has a few cardiac risk factors, but is a young age and acute coronary syndrome seems unlikely. She has had a persistent cough and some shortness of breath, therefore I wonder if she has a viral cardiomyopathy. The ER is entertaining dissection and/or pulmonary embolus which seems unlikely, however I will follow-up on her CT pulmonary angiogram. If this study is negative, I would recommend heparinization and checking cardiac enzymes. Would also recommend checking a 2-D echocardiogram tomorrow. The weekend rounding team will be by to see her and if her troponin continues to elevate, she will likely need to be transferred [facility] for cardiac catheterization 2. HTN - well-controlled, not suggestive of hypertensive emergency 3. DM2- on oral medications for 3 months, not likely a significant cardiac risk factor at this point 4. Persistent cough- ?post-viral or related to  underlying cardiomyopathy? Asthma?  Thanks for consulting [place]. We will follow closely with the primary team.  Time Spent Directly with Patient: 45 minutes  Pixie Casino, MD, [facility] Attending Cardiologist [place]

## 2015-03-01 NOTE — CV Procedure (Signed)
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 Kirk Ruths

## 2015-03-01 NOTE — Progress Notes (Signed)
2D Transesophageal Echocardiogram Complete.  03/01/2015   Debra Diaz, [place]

## 2015-03-01 NOTE — Progress Notes (Signed)
TCTS BRIEF PROGRESS NOTE   Patient seen and examined, chart and echo's reviewed.    I agree that the patient needs surgical resection of LA mass and potentially could schedule surgery on Friday.    Prior to surgery the patient needs either cardiac catheterization or cardiac-gated CTA of heart to r/o significant proximal CAD because of her presenting symptoms of chest pain w/ mildly elevated troponin and h/o diabetes.  I have ordered a cardiac-gated CTA but this could be cancelled if it would make more sense or be more expedient to do a cath.  For completeness sake I think it would be wise to get MRI of brain to r/o small embolic stroke not seen on CT.  Situation discussed briefly with patient and her family.  Full consult to follow.   [name] 03/01/2015 8:34 PM

## 2015-03-01 NOTE — Clinical Social Work Note (Signed)
CSW emailed [place] with signed letter from the doctor explaining patients situation and requesting that the patients mother be allowed into the country to take care of the patient and the patients children.  CSW signing off- please reconsult if additional needs arise.  Domenica Reamer, [place] Social Worker [phone]

## 2015-03-01 NOTE — H&P (View-Only) (Deleted)
Consult Reason for Consult: headache and nausea Referring Physician: Dr Tamala Julian Cardiology  CC: headache  HPI: Debra Diaz is an 39 y.o. female history of hypertension and DM admitted with chest pain. 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 Scheduled for TEE. Neurology consulted for question of possible embolic etiology of headache. She has tentative plans for a catheterization and TEE.   Headache described as frontal, pounding, associated nausea and photophobia. No visual changes. No focal motor or sensory changes. At its worst is a 6/10. Has history of headaches in the past but none this severe.   CT head imaging reviewed, shows no acute process.   Past Medical History  Diagnosis Date  . Hypertension   . Diabetes mellitus without complication     Past Surgical History  Procedure Laterality Date  . Cesarean section  1999 and 2011    x 2  . Tubal ligation  2011    Family History  Problem Relation Age of Onset  . Hypertension Mother   . Diabetes Father     Social History:  reports that she has never smoked. She does not have any smokeless tobacco history on file. She reports that she drinks alcohol. She reports that she does not use illicit drugs.  No Known Allergies  Medications:  Scheduled: . heparin  5,000 Units Subcutaneous 3 times per day  . insulin aspart  0-5 Units Subcutaneous QHS  . insulin aspart  0-9 Units Subcutaneous TID WC  . polyethylene glycol  17 g Oral Daily  . sodium chloride  3 mL Intravenous Q12H  . sodium chloride  3 mL Intravenous Q12H    ROS: Out of a complete 14 system review, the patient complains of only the following symptoms, and all other reviewed systems are negative. +headache, anxiety  Physical Examination: Filed Vitals:   03/01/15 0359  BP: 106/55  Pulse: 65  Temp: 98.8 F (37.1 C)  Resp: 19   Physical Exam  Constitutional: He appears well-developed and well-nourished.  Psych: Affect appropriate to  situation Eyes: No scleral injection HENT: No OP obstrucion Head: Normocephalic.  Cardiovascular: Normal rate and regular rhythm.  Respiratory: Effort normal and breath sounds normal.  GI: Soft. Bowel sounds are normal. No distension. There is no tenderness.  Skin: WDI  Neurologic Examination Mental Status: Alert, oriented, thought content appropriate.  Speech fluent without evidence of aphasia.  Able to follow 3 step commands without difficulty. Cranial Nerves: II: unable to visualize discs, visual fields grossly normal, pupils equal, round, reactive to light and accommodation III,IV, VI: ptosis not present, extra-ocular motions intact bilaterally V,VII: smile symmetric, facial light touch sensation normal bilaterally VIII: hearing normal bilaterally IX,X: gag reflex present XI: trapezius strength/neck flexion strength normal bilaterally XII: tongue strength normal  Motor: Right : Upper extremity    Left:     Upper extremity 5/5 deltoid       5/5 deltoid 5/5 biceps      5/5 biceps  5/5 triceps      5/5 triceps 5/5 hand grip      5/5 hand grip  Lower extremity     Lower extremity 5/5 hip flexor      5/5 hip flexor 5/5 quadricep      5/5 quadriceps  5/5 hamstrings     5/5 hamstrings 5/5 plantar flexion       5/5 plantar flexion 5/5 plantar extension     5/5 plantar extension Tone and bulk:normal tone throughout; no atrophy noted Sensory: Pinprick and  light touch intact throughout, bilaterally Deep Tendon Reflexes: 2+ and symmetric throughout Plantars: Right: downgoing   Left: downgoing Cerebellar: normal finger-to-nose, and normal heel-to-shin test Gait: deferred  Laboratory Studies:   Basic Metabolic Panel:  Recent Labs Lab 02/26/15 1546 02/27/15 0330 03/01/15 0520  NA 137 140 138  K 3.3* 3.4* 3.9  CL 102 107 106  CO2 25 24 29   GLUCOSE 99 103* 112*  BUN 13 8 8   CREATININE 0.65 0.54 0.56  CALCIUM 9.1 8.2* 8.5  MG  --  2.0  --   PHOS  --  4.0  --      Liver Function Tests:  Recent Labs Lab 02/26/15 1546 02/27/15 0330  AST 27 34  ALT 26 22  ALKPHOS 81 66  BILITOT 0.3 0.3  PROT 7.7 6.6  ALBUMIN 4.1 3.1*   No results for input(s): LIPASE, AMYLASE in the last 168 hours. No results for input(s): AMMONIA in the last 168 hours.  CBC:  Recent Labs Lab 02/26/15 1546 02/27/15 0330  WBC 12.0* 10.9*  NEUTROABS 8.1*  --   HGB 12.6 11.4*  HCT 40.3 35.9*  MCV 80.9 81.6  PLT 334 295    Cardiac Enzymes:  Recent Labs Lab 02/26/15 1546 02/27/15 0736 02/27/15 1257 02/27/15 1944  TROPONINI 0.44* 2.36* 1.63* 1.24*    BNP: Invalid input(s): POCBNP  CBG:  Recent Labs Lab 02/28/15 1118 02/28/15 1629 02/28/15 2147 03/01/15 0648 03/01/15 1129  GLUCAP 112* 111* 99 119* 104*    Microbiology: No results found for this or any previous visit.  Coagulation Studies:  Recent Labs  03/01/15 0520  LABPROT 14.3  INR 1.09    Urinalysis:  Recent Labs Lab 02/26/15 1405  COLORURINE YELLOW  LABSPEC 1.005  PHURINE 7.0  GLUCOSEU NEGATIVE  HGBUR TRACE*  BILIRUBINUR NEGATIVE  KETONESUR NEGATIVE  PROTEINUR NEGATIVE  UROBILINOGEN 0.2  NITRITE NEGATIVE  LEUKOCYTESUR NEGATIVE    Lipid Panel:  No results found for: CHOL, TRIG, HDL, CHOLHDL, VLDL, LDLCALC  HgbA1C:  Lab Results  Component Value Date   HGBA1C 6.3* 02/27/2015    Urine Drug Screen:  No results found for: LABOPIA, COCAINSCRNUR, LABBENZ, AMPHETMU, THCU, LABBARB  Alcohol Level: No results for input(s): ETH in the last 168 hours.  Other results:  Imaging: No results found.   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 Neurological exam is unremarkable. Suspect headache is likely migraine headache. With history of left atrial mass cannot rule out an embolic etiology so it would be prudent to proceed with MRI brain.  -check MRI brain without contrast -benadryl  12.5mg  IV and reglan 10mg  IV PRN q8hrs for symptomatic relief of headache -will follow up   Jim Like, DO [facility] [phone]  If 7pm- 7am, please page neurology on call as listed in [place]. 03/01/2015, 12:33 PM

## 2015-03-01 NOTE — Progress Notes (Signed)
Pt transported off to ENDO for procedure. Delia Heady RN

## 2015-03-01 NOTE — Progress Notes (Addendum)
       Patient Name: Debra Diaz Date of Encounter: 03/01/2015    SUBJECTIVE: The patient is tearful in the room. She complains of nausea and headache. Also complains of earache.  TELEMETRY:  Normal sinus rhythm Filed Vitals:   02/28/15 0414 02/28/15 1518 02/28/15 2013 03/01/15 0359  BP: 110/69 100/54 141/90 106/55  Pulse: 60 71 72 65  Temp: 98.8 F (37.1 C) 98.9 F (37.2 C) 98.8 F (37.1 C) 98.8 F (37.1 C)  TempSrc: Oral Oral Oral Oral  Resp: 16 17 18 19   Height:      Weight:      SpO2: 100% 97% 100% 97%    Intake/Output Summary (Last 24 hours) at 03/01/15 0902 Last data filed at 02/28/15 2100  Gross per 24 hour  Intake    480 ml  Output      0 ml  Net    480 ml   LABS: Basic Metabolic Panel:  Recent Labs  02/27/15 0330 03/01/15 0520  NA 140 138  K 3.4* 3.9  CL 107 106  CO2 24 29  GLUCOSE 103* 112*  BUN 8 8  CREATININE 0.54 0.56  CALCIUM 8.2* 8.5  MG 2.0  --   PHOS 4.0  --    CBC:  Recent Labs  02/26/15 1546 02/27/15 0330  WBC 12.0* 10.9*  NEUTROABS 8.1*  --   HGB 12.6 11.4*  HCT 40.3 35.9*  MCV 80.9 81.6  PLT 334 295   Cardiac Enzymes:  Recent Labs  02/27/15 0736 02/27/15 1257 02/27/15 1944  TROPONINI 2.36* 1.63* 1.24*   Radiology/Studies:  No new data. CT head and CT angiogram head and neck did not reveal any evidence of CVA or emboli.  Physical Exam: Blood pressure 106/55, pulse 65, temperature 98.8 F (37.1 C), temperature source Oral, resp. rate 19, height 5\' 1"  (1.549 m), weight 214 lb 8.1 oz (97.3 kg), last menstrual period 02/19/2015, SpO2 97 %. Weight change:   Wt Readings from Last 3 Encounters:  02/27/15 214 lb 8.1 oz (97.3 kg)  07/31/14 200 lb (90.719 kg)    No murmur or gallop is heard. Chest is clear. No obvious neurological abnormality  ASSESSMENT: 1. 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 Acute coronary syndrome, likely a manifestation of myxoma related emboli 3. Headache/earache with nausea and vomiting but  without evidence of CVA by CT. Still have concerns that this may be related to an embolic phenomenon from myxoma.  Plan:  1. Left and right heart catheterization today 2. Transesophageal echocardiogram 3. TCTS consult 4. Neuro consult. We should probably get MRI but will await assessment  Signed, Sinclair Grooms 03/01/2015, 9:02 AM

## 2015-03-01 NOTE — Progress Notes (Deleted)
Medicare Important Message given? YES  (If response is "NO", the following Medicare IM given date fields will be blank)  Date Medicare IM given: 03/01/15 Medicare IM given by:  Dahlia Client [employer]

## 2015-03-01 NOTE — Interval H&P Note (Deleted)
History and Physical Interval Note:  03/01/2015 3:07 PM  Debra Diaz  has presented today for surgery, with the diagnosis of myxoma  The various methods of treatment have been discussed with the patient and family. After consideration of risks, benefits and other options for treatment, the patient has consented to  Procedure(s): TRANSESOPHAGEAL ECHOCARDIOGRAM (TEE) (N/A) as a surgical intervention .  The patient's history has been reviewed, patient examined, no change in status, stable for surgery.  I have reviewed the patient's chart and labs.  Questions were answered to the patient's satisfaction.     Kirk Ruths

## 2015-03-01 NOTE — Plan of Care (Signed)
Problem: Phase I Progression Outcomes Goal: Pain controlled with appropriate interventions Outcome: Not Progressing Patient still states she is having chest pressure.  She is also complaining of nausea, vomiting and dizziness.  Zofran was given and MD notified.

## 2015-03-02 ENCOUNTER — Encounter (HOSPITAL_COMMUNITY)
Admission: EM | Disposition: A | Discharge status: Home / Self Care | Payer: Self-pay | Source: Home / Self Care | Attending: Internal Medicine

## 2015-03-02 ENCOUNTER — Inpatient Hospital Stay (HOSPITAL_COMMUNITY): Payer: No Typology Code available for payment source

## 2015-03-02 ENCOUNTER — Encounter (HOSPITAL_COMMUNITY): Payer: Self-pay | Admitting: Cardiology

## 2015-03-02 DIAGNOSIS — R222 Localized swelling, mass and lump, trunk: Secondary | ICD-10-CM

## 2015-03-02 DIAGNOSIS — I2510 Atherosclerotic heart disease of native coronary artery without angina pectoris: Secondary | ICD-10-CM

## 2015-03-02 HISTORY — PX: LEFT HEART CATHETERIZATION WITH CORONARY ANGIOGRAM: SHX5451

## 2015-03-02 LAB — PULMONARY FUNCTION TEST
DL/VA % pred: 149 %
DL/VA: 6.57 ml/min/mmHg/L
DLCO cor % pred: 156 %
DLCO cor: 31.61 ml/min/mmHg
DLCO unc % pred: 145 %
DLCO unc: 29.48 ml/min/mmHg
FEF 25-75 Post: 4.66 L/sec
FEF 25-75 Pre: 4.16 L/sec
FEF2575-%Change-Post: 11 %
FEF2575-%Pred-Post: 157 %
FEF2575-%Pred-Pre: 140 %
FEV1-%Change-Post: 3 %
FEV1-%Pred-Post: 119 %
FEV1-%Pred-Pre: 115 %
FEV1-Post: 3.26 L
FEV1-Pre: 3.16 L
FEV1FVC-%Change-Post: 2 %
FEV1FVC-%Pred-Pre: 104 %
FEV6-%Change-Post: 0 %
FEV6-%Pred-Post: 113 %
FEV6-%Pred-Pre: 112 %
FEV6-Post: 3.71 L
FEV6-Pre: 3.69 L
FEV6FVC-%Pred-Post: 102 %
FEV6FVC-%Pred-Pre: 102 %
FVC-%Change-Post: 0 %
FVC-%Pred-Post: 111 %
FVC-%Pred-Pre: 110 %
FVC-Post: 3.71 L
FVC-Pre: 3.69 L
Post FEV1/FVC ratio: 88 %
Post FEV6/FVC ratio: 100 %
Pre FEV1/FVC ratio: 85 %
Pre FEV6/FVC Ratio: 100 %
RV % pred: 103 %
RV: 1.48 L
TLC % pred: 129 %
TLC: 5.94 L

## 2015-03-02 LAB — GLUCOSE, CAPILLARY
Glucose-Capillary: 94 mg/dL (ref 70–99)
Glucose-Capillary: 89 mg/dL (ref 70–99)

## 2015-03-02 LAB — POCT ACTIVATED CLOTTING TIME
Activated Clotting Time: 208 seconds
Activated Clotting Time: 177 seconds

## 2015-03-02 SURGERY — LEFT HEART CATHETERIZATION WITH CORONARY ANGIOGRAM
Anesthesia: LOCAL

## 2015-03-02 MED ORDER — HEPARIN (PORCINE) IN NACL 2-0.9 UNIT/ML-% IJ SOLN
INTRAMUSCULAR | Status: AC
Start: 2015-03-02 — End: 2015-03-02
  Filled 2015-03-02: qty 1000

## 2015-03-02 MED ORDER — SODIUM CHLORIDE 0.9 % IV SOLN
INTRAVENOUS | Status: DC
  Administered 2015-03-02: 15:00:00 via INTRAVENOUS

## 2015-03-02 MED ORDER — NITROGLYCERIN 1 MG/10 ML FOR IR/CATH LAB
INTRA_ARTERIAL | Status: AC
  Filled 2015-03-02: qty 10

## 2015-03-02 MED ORDER — SODIUM CHLORIDE 0.9 % IV SOLN: 250.0000 mL | INTRAVENOUS | Status: DC | PRN

## 2015-03-02 MED ORDER — VERAPAMIL HCL 2.5 MG/ML IV SOLN
INTRAVENOUS | Status: AC
  Filled 2015-03-02: qty 2

## 2015-03-02 MED ORDER — ALBUTEROL SULFATE (2.5 MG/3ML) 0.083% IN NEBU
2.5000 mg | INHALATION_SOLUTION | Freq: Once | RESPIRATORY_TRACT | Status: AC
  Administered 2015-03-02: 2.5 mg via RESPIRATORY_TRACT

## 2015-03-02 MED ORDER — HEPARIN SODIUM (PORCINE) 1000 UNIT/ML IJ SOLN
INTRAMUSCULAR | Status: AC
  Filled 2015-03-02: qty 1

## 2015-03-02 MED ORDER — MORPHINE SULFATE 2 MG/ML IJ SOLN
2.0000 mg | INTRAMUSCULAR | Status: DC | PRN
  Administered 2015-03-02 – 2015-03-03 (×2): 2 mg via INTRAVENOUS
  Filled 2015-03-02: qty 1

## 2015-03-02 MED ORDER — SODIUM CHLORIDE 0.9 % IJ SOLN: 3.0000 mL | INTRAMUSCULAR | Status: DC | PRN

## 2015-03-02 MED ORDER — ASPIRIN 81 MG PO CHEW: 81.0000 mg | CHEWABLE_TABLET | ORAL | Status: DC

## 2015-03-02 MED ORDER — SODIUM CHLORIDE 0.9 % IV SOLN
1.0000 mL/kg/h | INTRAVENOUS | Status: AC
  Administered 2015-03-02: 1 mL/kg/h via INTRAVENOUS

## 2015-03-02 MED ORDER — SODIUM CHLORIDE 0.9 % IJ SOLN
3.0000 mL | Freq: Two times a day (BID) | INTRAMUSCULAR | Status: DC
  Administered 2015-03-03: 3 mL via INTRAVENOUS

## 2015-03-02 MED ORDER — MIDAZOLAM HCL 2 MG/2ML IJ SOLN
INTRAMUSCULAR | Status: AC
  Filled 2015-03-02: qty 2

## 2015-03-02 MED ORDER — LIDOCAINE HCL (PF) 1 % IJ SOLN
INTRAMUSCULAR | Status: AC
  Filled 2015-03-02: qty 30

## 2015-03-02 MED ORDER — AMIODARONE HCL 200 MG PO TABS
400.0000 mg | ORAL_TABLET | Freq: Two times a day (BID) | ORAL | Status: DC
  Administered 2015-03-02 – 2015-03-03 (×3): 400 mg via ORAL
  Filled 2015-03-02 (×6): qty 2

## 2015-03-02 MED ORDER — FENTANYL CITRATE 0.05 MG/ML IJ SOLN
INTRAMUSCULAR | Status: AC
  Filled 2015-03-02: qty 2

## 2015-03-02 MED ORDER — SODIUM CHLORIDE 0.9 % IJ SOLN
3.0000 mL | Freq: Two times a day (BID) | INTRAMUSCULAR | Status: DC
  Administered 2015-03-02: 3 mL via INTRAVENOUS

## 2015-03-02 MED ORDER — ASPIRIN 81 MG PO CHEW
CHEWABLE_TABLET | ORAL | Status: AC
Start: 2015-03-02 — End: 2015-03-02
  Filled 2015-03-02: qty 1

## 2015-03-02 NOTE — Consult Note (Signed)
[address]       [place] [address]             [phone]          CARDIOTHORACIC SURGERY CONSULTATION REPORT  PCP is No PCP Per Patient Referring Provider is Sinclair Grooms, MD  Reason for consultation:  Left atrial mass  HPI:  Patient is a 39 year old obese Hispanic female with history of hypertension and type 2 diabetes mellitus who was admitted to the hospital acutely on 02/26/2015 with symptoms of headache and substernal chest pressure, shortness of breath, and left arm numbness. Chest pain was initially fairly severe but gradually subsided. Baseline EKG revealed sinus rhythm without ischemic changes.  Troponin levels were elevated. CT angiogram of the chest revealed normal appearance of the thoracic aorta with no sign of aortic dissection, but a small mass was noted in the left atrium suspicious for atrial myxoma. Subsequent transthoracic and transesophageal echocardiograms confirm the presence of a fairly large mass within the left atrium that appears to the adherent to the anterior atrial septum.  CT scan of the head was notable for the absence of sign of stroke or other significant abnormality. Cardiothoracic surgical consultation was requested.  The patient is originally from [place] but has lived locally in [place] for many years. She is married and has 5 children.  Prior to her current hospitalization she reports no previous history of exertional chest discomfort or shortness of breath. She admits that she has not very active physically. She denies any history of PND, orthopnea, or lower extremity edema. She has not had dizzy spells, palpitations, nor syncope.  Past Medical History  Diagnosis Date  . Hypertension   . Diabetes mellitus without complication   . Morbid obesity     Past Surgical History  Procedure Laterality Date  . Cesarean section  1999 and 2011    x 2  . Tubal ligation  2011  . Tee without cardioversion N/A 03/01/2015   Procedure: TRANSESOPHAGEAL ECHOCARDIOGRAM (TEE);  Surgeon: [name], MD;  Location: [facility] ENDOSCOPY;  Service: Cardiovascular;  Laterality: N/A;    Family History  Problem Relation Age of Onset  . Hypertension Mother   . Diabetes Father     History   Social History  . Marital Status: Married    Spouse Name: N/A  . Number of Children: N/A  . Years of Education: N/A   Occupational History  . Not on file.   Social History Main Topics  . Smoking status: Never Smoker   . Smokeless tobacco: Not on file  . Alcohol Use: Yes     Comment: occasional  . Drug Use: No  . Sexual Activity: Yes    Birth Control/ Protection: Surgical   Other Topics Concern  . Not on file   Social History Narrative    Prior to Admission medications   Medication Sig Start Date End Date Taking? Authorizing Provider  ibuprofen (ADVIL,MOTRIN) 200 MG tablet Take 800 mg by mouth once as needed for mild pain or moderate pain.   Yes Historical Provider, MD  lisinopril-hydrochlorothiazide (PRINZIDE,ZESTORETIC) 20-25 MG per tablet Take 1 tablet by mouth daily.   Yes Historical Provider, MD  metFORMIN (GLUCOPHAGE) 500 MG tablet Take 500 mg by mouth 2 (two) times daily.   Yes Historical Provider, MD  Multiple Vitamin (MULTIVITAMIN WITH MINERALS) TABS tablet Take 1 tablet by mouth daily.   Yes Historical Provider, MD  HYDROcodone-acetaminophen (NORCO/VICODIN) 5-325 MG per  tablet 1 or 2 tabs PO q6 hours prn pain Patient not taking: Reported on 02/26/2015 07/31/14   Francine Graven, DO  methocarbamol (ROBAXIN) 500 MG tablet Take 2 tablets (1,000 mg total) by mouth 4 (four) times daily as needed for muscle spasms (muscle spasm/pain). Patient not taking: Reported on 02/26/2015 07/31/14   Francine Graven, DO    Current Facility-Administered Medications  Medication Dose Route Frequency Provider Last Rate Last Dose  . 0.9 %  sodium chloride infusion  1,000 mL Intravenous Continuous Charlesetta Shanks, MD 125 mL/hr at  03/02/15 0019 1,000 mL at 03/02/15 0019  . 0.9 %  sodium chloride infusion  250 mL Intravenous PRN Flossie Dibble, MD      . 0.9 %  sodium chloride infusion  250 mL Intravenous PRN Belva Crome, MD      . 0.9 %  sodium chloride infusion   Intravenous Continuous Belva Crome, MD 50 mL/hr at 03/02/15 1500    . 0.9 %  sodium chloride infusion  1 mL/kg/hr Intravenous Continuous [name], MD 97.3 mL/hr at 03/02/15 1850 1 mL/kg/hr at 03/02/15 1850  . 0.9 %  sodium chloride infusion  250 mL Intravenous PRN [name], MD      . acetaminophen (TYLENOL) tablet 650 mg  650 mg Oral Q6H PRN Minus Breeding, MD   650 mg at 03/01/15 2238  . [START ON 03/03/2015] aspirin chewable tablet 81 mg  81 mg Oral Pre-Cath Belva Crome, MD      . docusate sodium (COLACE) capsule 100 mg  100 mg Oral BID PRN Josue Hector, MD      . guaiFENesin-codeine 100-10 MG/5ML solution 5 mL  5 mL Oral Q4H PRN Josue Hector, MD   5 mL at 03/01/15 2238  . heparin injection 5,000 Units  5,000 Units Subcutaneous 3 times per day Flossie Dibble, MD   5,000 Units at 03/02/15 0552  . insulin aspart (novoLOG) injection 0-5 Units  0-5 Units Subcutaneous QHS Flossie Dibble, MD   0 Units at 02/27/15 2200  . insulin aspart (novoLOG) injection 0-9 Units  0-9 Units Subcutaneous TID WC Flossie Dibble, MD   Stopped at 03/01/15 [phone]  . morphine 2 MG/ML injection 2 mg  2 mg Intravenous Q4H PRN Flossie Dibble, MD      . ondansetron (ZOFRAN) tablet 4 mg  4 mg Oral Q6H PRN Flossie Dibble, MD       Or  . ondansetron (ZOFRAN) injection 4 mg  4 mg Intravenous Q6H PRN Flossie Dibble, MD   4 mg at 03/01/15 1017  . polyethylene glycol (MIRALAX / GLYCOLAX) packet 17 g  17 g Oral Daily Pixie Casino, MD   17 g at 02/28/15 0958  . sodium chloride 0.9 % injection 3 mL  3 mL Intravenous Q12H Flossie Dibble, MD   3 mL at 03/02/15 1000  . sodium chloride 0.9 % injection 3 mL  3 mL Intravenous PRN Flossie Dibble, MD      . sodium chloride 0.9  % injection 3 mL  3 mL Intravenous Q12H Belva Crome, MD   3 mL at 03/02/15 1500  . sodium chloride 0.9 % injection 3 mL  3 mL Intravenous PRN Belva Crome, MD      . Derrill Memo ON 03/03/2015] sodium chloride 0.9 % injection 3 mL  3 mL Intravenous Q12H [name], MD      . sodium  chloride 0.9 % injection 3 mL  3 mL Intravenous PRN [name], MD        No Known Allergies    Review of Systems:   General:  normal appetite, normal energy, no weight gain, no weight loss, no fever  Cardiac:  + chest pain with exertion, + chest pain at rest, + SOB with exertion, no resting SOB, no PND, no orthopnea, no palpitations, no arrhythmia, no atrial fibrillation, no LE edema, no dizzy spells, no syncope  Respiratory:  no shortness of breath, no home oxygen, no productive cough, + recent dry cough, no bronchitis, no wheezing, no hemoptysis, no asthma, no pain with inspiration or cough, no sleep apnea, no CPAP at night  GI:   no difficulty swallowing, no reflux, no frequent heartburn, no hiatal hernia, no abdominal pain, no constipation, no diarrhea, no hematochezia, no hematemesis, no melena  GU:   no dysuria,  no frequency, no urinary tract infection, no hematuria, no kidney stones, no kidney disease  Vascular:  no pain suggestive of claudication, no pain in feet, no leg cramps, no varicose veins, no DVT, no non-healing foot ulcer  Neuro:   no stroke, no TIA's, no seizures, + headaches, no temporary blindness one eye,  no slurred speech, no peripheral neuropathy, no chronic pain, no instability of gait, no memory/cognitive dysfunction  Musculoskeletal: no arthritis, no joint swelling, no myalgias, no difficulty walking, normal mobility   Skin:   no rash, no itching, no skin infections, no pressure sores or ulcerations  Psych:   no anxiety, no depression, no nervousness, no unusual recent stress  Eyes:   no blurry vision, no floaters, no recent vision changes, does not wear glasses or  contacts  ENT:   no hearing loss, no loose or painful teeth, no dentures, last saw dentist several years ago  Hematologic:  no easy bruising, no abnormal bleeding, no clotting disorder, no frequent epistaxis  Endocrine:  + diabetes, does not check CBG's at home     Physical Exam:   BP 147/94 mmHg  Pulse 76  Temp(Src) 99.2 F (37.3 C) (Oral)  Resp 18  Ht _0  (1.549 m)  Wt 97.3 kg (214 lb 8.1 oz)  BMI 40.55 kg/m2  SpO2 100%  LMP 02/19/2015  General:  Morbidly obese but o/w  well-appearing  HEENT:  Unremarkable   Neck:   no JVD, no bruits, no adenopathy   Chest:   clear to auscultation, symmetrical breath sounds, no wheezes, no rhonchi   CV:   RRR, no murmur   Abdomen:  soft, non-tender, no masses   Extremities:  warm, well-perfused, pulses palpable, no lower extremity edema  Rectal/GU  Deferred  Neuro:   Grossly non-focal and symmetrical throughout  Skin:   Clean and dry, no rashes, no breakdown  Diagnostic Tests:  Transthoracic Echocardiography  Patient:  Debra Diaz, Debra Diaz MR #:    [id number] Study Date: 02/27/2015 Gender:   F Age:    24 Height:   154.9 cm Weight:   97.1 kg BSA:    2.1 m^2 Pt. Status: Room:    [id number]  Jetty Duhamel, M.D. REFERRING  Jenkins Rouge, M.D. SONOGRAPHER Johny Chess, [place], CCT PERFORMING  [facility], Inpatient ATTENDING  Charlesetta Shanks [id number] ADMITTING  Geralynn Ochs T  cc:  ------------------------------------------------------------------- LV EF: 60% -  65%  ------------------------------------------------------------------- History:  PMH: Cardiac tumor. Angina pectoris. Risk factors: Elevated troponin. Hypertension. Diabetes mellitus.  ------------------------------------------------------------------- Study Conclusions  - Left ventricle: The  cavity size was normal. Wall thickness was normal. Systolic function was normal. The estimated ejection fraction was in  the range of 60% to 65%. - Atrial septum: No defect or patent foramen ovale was identified. 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 Suggest TEE or MRI to further evaluate.  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 Suggest TEE or MRI to further evaluate.  Transthoracic echocardiography. M-mode, complete 2D, spectral Doppler, and color Doppler. Birthdate: Patient birthdate: [birth year]. Age: Patient is 39 yr old. Sex: Gender: female. BMI: 40.4 kg/m^2. Blood pressure:   99/65 Patient status: Inpatient. Study date: Study date: 02/27/2015. Study time: 09:08 AM. Location: Bedside.  -------------------------------------------------------------------  ------------------------------------------------------------------- Left ventricle: The cavity size was normal. Wall thickness was normal. Systolic function was normal. The estimated ejection fraction was in the range of 60% to 65%.  ------------------------------------------------------------------- Aortic valve:  Structurally normal valve.  Cusp separation was normal. Doppler: Transvalvular velocity was within the normal range. There was no stenosis. There was no regurgitation.  ------------------------------------------------------------------- Aorta: The aorta was normal, not dilated, and non-diseased.  ------------------------------------------------------------------- Mitral valve:  Structurally normal valve.  Leaflet separation was normal. Doppler: Transvalvular velocity was within the normal range. There was no evidence for stenosis. There was no regurgitation.  Peak gradient (D): 4 mm Hg.  ------------------------------------------------------------------- Left atrium: The atrium was normal in size.  ------------------------------------------------------------------- Atrial septum: No defect or patent  foramen ovale was identified.  ------------------------------------------------------------------- Right ventricle: The cavity size was normal. Wall thickness was normal. Systolic function was normal.  ------------------------------------------------------------------- Pulmonic valve:  Structurally normal valve.  Cusp separation was normal. Doppler: Transvalvular velocity was within the normal range. There was no regurgitation.  ------------------------------------------------------------------- Tricuspid valve:  Doppler: There was trivial regurgitation.  ------------------------------------------------------------------- Right atrium: The atrium was normal in size.  ------------------------------------------------------------------- Pericardium: The pericardium was normal in appearance.  ------------------------------------------------------------------- Post procedure conclusions Ascending Aorta:  - The aorta was normal, not dilated, and non-diseased.  ------------------------------------------------------------------- Measurements  Left ventricle             Value    Reference LV ID, ED, PLAX chordal        49.7 mm   43 - 52 LV ID, ES, PLAX chordal        32.4 mm   23 - 38 LV fx shortening, PLAX chordal     35  %   >=29 LV PW thickness, ED          10.8 mm   --------- IVS/LV PW ratio, ED          0.99     <=1.3 LV e&', lateral             10.8 cm/s  --------- LV E/e&', lateral            9.81     --------- LV e&', medial             6.8  cm/s  --------- LV E/e&', medial            15.59    --------- LV e&', average             8.8  cm/s  --------- LV E/e&', average            12.05    ---------  Ventricular septum           Value    Reference IVS thickness, ED  10.7 mm   ---------  LVOT                  Value    Reference LVOT ID, S               22  mm   --------- LVOT area               3.8  cm^2  ---------  Aorta                 Value    Reference Aortic root ID, ED           29  mm   ---------  Left atrium              Value    Reference LA ID, A-P, ES             41  mm   --------- LA ID/bsa, A-P             1.95 cm/m^2 <=2.2 LA volume, S              60.2 ml   --------- LA volume/bsa, S            28.7 ml/m^2 --------- LA volume, ES, 1-p A4C         66.7 ml   --------- LA volume/bsa, ES, 1-p A4C       31.8 ml/m^2 --------- LA volume, ES, 1-p A2C         49.2 ml   --------- LA volume/bsa, ES, 1-p A2C       23.5 ml/m^2 ---------  Mitral valve              Value    Reference Mitral E-wave peak velocity      106  cm/s  --------- Mitral A-wave peak velocity      110  cm/s  --------- Mitral deceleration time    (H)   254  ms   150 - 230 Mitral peak gradient, D        4   mm Hg --------- Mitral E/A ratio, peak         1      ---------  Systemic veins             Value    Reference Estimated CVP             3   mm Hg ---------  Right ventricle            Value    Reference RV s&', lateral, S           11.1 cm/s  ---------  Legend: (L) and (H) mark values outside specified reference range.  ------------------------------------------------------------------- Prepared and Electronically Authenticated by  Jenkins Rouge, M.D. 2016-03-12T10:07:35      Transesophageal Echocardiography  Patient:  Debra Diaz, Debra Diaz MR #:    [id number] Study Date:  03/01/2015 Gender:   F Age:    46 Height:   154.9 cm Weight:   97.3 kg BSA:    2.1 m^2 Pt. Status: Room:  Jetty Duhamel, M.D. ATTENDING Lyman Bishop MD ADMITTING Geralynn Ochs T  cc:  ------------------------------------------------------------------- LV EF: 55% -  60%  ------------------------------------------------------------------- Indications:   Cardiac Tumor (D48.9).  ------------------------------------------------------------------- History:  PMH: Cardiac Mass. Chest pain. Risk factors: Hypertension. Diabetes mellitus.  ------------------------------------------------------------------- Study Conclusions  - Left ventricle: Systolic function was normal. The estimated ejection fraction was in the range of 55%  to 60%. Wall motion was normal; there were no regional wall motion abnormalities. - Aortic valve: No evidence of vegetation. - Mitral valve: No evidence of vegetation. - Atrial septum: No defect or patent foramen ovale was identified. - Tricuspid valve: No evidence of vegetation. - Pulmonic valve: No evidence of vegetation.  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  Diagnostic transesophageal echocardiography. 2D and color Doppler. Birthdate: Patient birthdate: 1976-05-18. Age: Patient is 39 yr old. Sex: Gender: female.  BMI: 40.5 kg/m^2. Blood pressure: 180/90 Patient status: Inpatient. Study date: Study date: 03/01/2015. Study time: 02:53 PM. Location: Endoscopy.  -------------------------------------------------------------------  ------------------------------------------------------------------- Left ventricle: Systolic function was normal. The estimated ejection fraction was in the range of 55% to 60%. Wall motion was normal; there were no regional wall motion  abnormalities.  ------------------------------------------------------------------- Aortic valve:  Structurally normal valve.  Cusp separation was normal. No evidence of vegetation. Doppler: There was no regurgitation.  ------------------------------------------------------------------- Aorta: Descending aorta: The descending aorta had no disease.  ------------------------------------------------------------------- Mitral valve:  Structurally normal valve.  Leaflet separation was normal. No evidence of vegetation. Doppler: There was no regurgitation.  ------------------------------------------------------------------- Left atrium: The atrium was normal in size.  ------------------------------------------------------------------- Atrial septum: No defect or patent foramen ovale was identified.  ------------------------------------------------------------------- Right ventricle: The cavity size was normal. Systolic function was normal.  ------------------------------------------------------------------- Pulmonic valve:  Structurally normal valve.  Cusp separation was normal. No evidence of vegetation.  ------------------------------------------------------------------- Tricuspid valve:  Structurally normal valve.  Leaflet separation was normal. No evidence of vegetation. Doppler: There was trivial regurgitation.  ------------------------------------------------------------------- Right atrium: The atrium was normal in size.  ------------------------------------------------------------------- Pericardium: There was no pericardial effusion.  ------------------------------------------------------------------- Prepared and Electronically Authenticated by  Kirk Ruths 2016-03-14T18:01:44    CARDIAC CATHETERIZATION REPORT  NAME: Debra Etienne Mendez-RequenaMRN:[id number] DOB:  Nov 22, 1977ADMIT DATE: 02/26/2015 Procedure Date: 03/02/2015  INTERVENTIONAL CARDIOLOGIST: [name], M.D., MS PRIMARY CARE PROVIDER: No PCP Per Patient PRIMARY CARDIOLOGIST: New to [facility]; Consult by Dr. Debara Pickett. Rounded on by Dr. Johnsie Cancel CARDIAC SURGEON: Dr. Roxy Manns  PATIENT: Debra Diaz is a 39 y.o. Spanish only speaking female with a history of hypertension and type 2 diabetes as well as hypertension. She presented with sudden onset frontal temporal headache and central chest pressure. She noted left arm numbness and dyspnea. She noted 710 chest pain that improved 3 out 10 chest pain with ibuprofen. She had an elevated troponin level that did not reach greater than 2. 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 She has been evaluated by Dr. Roxy Manns from [facility] who recommended angiographic evaluation. Patient now posted for invasive cardiovascular evaluation with left heart catheterization diagnostic coronary angiography.. I was asked to use radial access of possible, in order to preserve femoral access for "Mid-CAP" cardiac surgery.  PRE-OPERATIVE DIAGNOSIS:   NSTEMI  Left Atrial Myxoma  PROCEDURES PERFORMED:   Left Heart Catheterization with Native Coronary Angiography via Right Radial And Left Common Femoral Artery   Direct Ultrasound Guided Radial Artery Access  PROCEDURE: The patient was brought to the [facility]nd [facility] in the fasting state and prepped and draped in the usual sterile fashion for Right Radial artery access. A modified Allen's test was performed on the Right wrist demonstrating excellent collateral flow for radial access. Sterile technique was used including antiseptics, cap, gloves, gown, hand hygiene, mask and sheet. Skin prep: Chlorhexidine.   Consent:  Risks of procedure as well as the alternatives and risks of each were explained to  the (patient/caregiver). Consent for procedure obtained.   Time Out: Verified patient identification, verified procedure, site/side was marked, verified correct patient position, special equipment/implants available, medications/allergies/relevent history reviewed, required imaging and test results available. Performed.  Access:   Right Radial Artery: 6 Fr Sheath - Seldinger Technique (Angiocath Micropuncture Kit)  Direct Ultrasound Guided Access after several failed attempts due to clotting of blood in the access needle  Radial Cocktail - 10 mL; IV Heparin 5000 Units  Left Common Femoral Artery: 5 Fr Sheath - fluoroscopically guided modified Seldinger technique    Left Heart Catheterization:   From radial access: 5 French TIG 4.0 catheter was advanced over long exchange safety wire into the ascending aorta. Unfortunately the catheter would not engage either the coronary arteries. The aortic valve was successfully crossed and left ventricular hemodynamics was obtained. Unfortunately, upon exchanging the TIG 4.0 catheter for a diagnostic JR4 catheter both 5 [place] and 4 [place], the patient had significant arm pain and vasospasm. Despite the administration of additional 3 mg of IA verapamil, the 4 French catheter was not able to be advanced beyond the mid brachial artery. At this point the decision was made to abandon radial approach and switch to left femoral approach in order to preserve the right femoral access for planned CT surgery.  Left femoral artery access: 5 Fr Catheters advanced or exchanged over a standard J-wire under fluoroscopic guidance; JL 3.5 catheter advanced first.  Left Coronary Artery Cineangiography: JL 3.5 Catheter   Right Coronary Artery Cineangiography: JR4 Catheter   LV Hemodynamics: TIG 4.0 catheter from radial approach   Right Radial Sheath was removed in the [place] with TR Band placement for hemostasis once the decision was  made to abandon radial access.   TR Band: 1804 Hours; 9 mL air   Left femoral artery sheath was sutured in place as the ACT > 200 sec - to be removed in PACU holding area once ACT < 170 sec  FINDINGS:  Hemodynamics:   Central Aortic Pressure / Mean: 109/73/91 mmHg  Left Ventricular Pressure / LVEDP: 110/2/9 mmHg  Left Ventriculography: Deferred   Coronary Anatomy:  Dominance: Right  Left Main: Very Large caliber vessel that trifurcates into the LAD, Ramus Intermedius, and Left Circumflex. Angiographically normal  LAD: Large-caliber vessel with a very proximal large caliber High First Diagonal Branch. The LAD has mild diffuse tender 20% lesions but is relatively atrophic normal as it courses down around the apex perfusing the distal third of the inferoapex.   D1: Large caliber high branch with several distal branches. Mild possible luminal irregularities of less than 30%.   D2: Moderate caliber vessel that arises from the mid LAD. It does not cover large distribution but is angiographically normal.  Left Circumflex: Large-caliber, nondominant vessel that courses mostly as a large lateral bifurcating OM branch. The inferior branch is much larger than the superior branch. The distal vessel is tortuous and reaches down almost of the inferoapex. Mild luminal irregularity. There is a very small AV groove branch..   Ramus intermedius: Large caliber vessel that courses is a high OM. It gives off a moderate sized branch from the mid vessel just after a eccentric tubular 30% lesion.. The daughter vessel and parent both reaches almost to the apex. They are somewhat tortuous, but relatively free of disease.   RCA: Large-caliber codominant vessel that gives rise to a significant RV marginal branch  in the mid vessel. It bifurcates distally into the Right Posterior Descending Artery (RPDA) and the Right Posterior AV Groove Branch (RPAV). Angiographically normal.   RPDA: Large caliber  vessel that reaches two thirds the way to the apex. Angiographically normal.   RPL Sysytem:The RPAV begins as a moderate large vessel that terminates as a moderate caliber posterolateral branch. Angiographically normal.  MEDICATIONS:  Anesthesia: Local Lidocaine 2 mL for radial access, 16 mL for left femoral access   Sedation: 65m IV Versed, 100 mcg IV fentanyl ;   Omnipaque Contrast: 60 ml  Anticoagulation: IV Heparin 5000 Units  Radial Cocktail: 5 mg Verapamil, 400 mcg NTG, 2 ml 2% Lidocaine in 10 ml NS  IA Verapamil 3 mg  Subcutaneous 100 g NTG  PATIENT DISPOSITION:   The patient was transferred to the PACU holding area in a hemodynamicaly stable, chest pain free condition.  The patient tolerated the procedure well, and there were no complications. EBL: < 10 ml  The patient was stable before, during, and after the procedure.  POST-OPERATIVE DIAGNOSIS:   Angiographically only minimal coronary artery disease, but for the most part angiographic normal coronary arteries.   Normal LVEDP   Significant right radial and brachial artery spasm  PLAN OF CARE:  Standard post radial and femoral cath care. Sheath will be removed in the PACU holding area.  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 Interventional Cardiologist     Impression:  The patient presents with atypical chest discomfort, elevated troponin levels, mild exertional shortness of breath, and headaches.  I have personally reviewed the patient's echocardiograms and cardiac catheterization.  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  Cardiac catheterization is notable for the absence of any significant coronary artery disease and no sign of coronary embolization.  The source of the patient's  headaches remains unclear, but CT scan and MRI of the brain both are notable for the absence of any intracranial abnormalities to suggest embolization.    Plan:  I have discussed the indications, risks and potential benefits of elective surgical resection of the patient's left atrial mass with the patient and her family yesterday evening. Following her heart catheterization this afternoon I have gone through matters again at length with the patient using the assistance of an interpreter at the bedside.  The patient understands and accepts all potential associated risks of surgery including but not limited to risk of death, stroke, myocardial infarction, congestive heart failure, respiratory failure, renal failure, bleeding requiring blood transfusion and/or reexploration, arrhythmia, heart block or bradycardia requiring permanent pacemaker, pneumonia, pleural effusion, wound infection, pulmonary embolus or other thromboembolic complication, chronic pain or other delayed complications.  Alternative surgical approaches have been discussed including a comparison between conventional sternotomy and minimally-invasive techniques.  The relative risks and benefits of each have been reviewed as they pertain to the patient's specific circumstances, and all of their questions have been addressed.  Specific risks potentially related to the minimally-invasive approach were discussed at length, including but not limited to risk of conversion to full or partial sternotomy, aortic dissection or other major vascular complication, unilateral acute lung injury or pulmonary edema, phrenic nerve dysfunction or paralysis, rib fracture, chronic pain, lung hernia, or lymphocele. All questions answered.  We plan to proceed with surgery on Thursday, 03/04/2015. The patient  will be started on oral amiodarone prior to surgery to decrease her risk of perioperative atrial dysrhythmias.   I spent in excess of 120 minutes during the  conduct of this hospital consultation and >50% of this time involved direct face-to-face encounter for counseling and/or coordination of the patient's care.   Valentina Gu. Roxy Manns, MD 03/02/2015 7:33 PM \

## 2015-03-02 NOTE — H&P (View-Only) (Signed)
       Patient Name: Debra Diaz Date of Encounter: 03/02/2015    SUBJECTIVE:Still with intermittent headache and nausea.   TELEMETRY:  NSR Filed Vitals:   03/01/15 1610 03/01/15 1630 03/01/15 2213 03/02/15 0515  BP: 131/74 120/78 118/80 114/68  Pulse: 75 77 70 65  Temp:  98.7 F (37.1 C) 98.7 F (37.1 C) 98.6 F (37 C)  TempSrc:  Oral Oral Oral  Resp: 22 20 18 18   Height:      Weight:      SpO2: 99% 95% 97% 98%    Intake/Output Summary (Last 24 hours) at 03/02/15 0817 Last data filed at 03/01/15 1700  Gross per 24 hour  Intake    240 ml  Output    550 ml  Net   -310 ml   LABS: Basic Metabolic Panel:  Recent Labs  03/01/15 0520  NA 138  K 3.9  CL 106  CO2 29  GLUCOSE 112*  BUN 8  CREATININE 0.56  CALCIUM 8.5   CBC: No results for input(s): WBC, NEUTROABS, HGB, HCT, MCV, PLT in the last 72 hours. Cardiac Enzymes:  Recent Labs  02/27/15 1257 02/27/15 1944  TROPONINI 1.63* 1.24*     Radiology/Studies:  MRI/A brain is normal.  Physical Exam: Blood pressure 114/68, pulse 65, temperature 98.6 F (37 C), temperature source Oral, resp. rate 18, height 5\' 1"  (1.549 m), weight 214 lb 8.1 oz (97.3 kg), last menstrual period 02/19/2015, SpO2 98 %. Weight change:   Wt Readings from Last 3 Encounters:  02/27/15 214 lb 8.1 oz (97.3 kg)  07/31/14 200 lb (90.719 kg)    No murmur or gallop.  ASSESSMENT:  1. Left atrial Myxoma 2. ACS due to suspected embolic phenomena 3. Headache and nausea of uncertain cause. Brain OK by MRI  Plan:   Plan coronary angiography today. Consent obtained from the patient who speaks good english. Described procedure, risks, and alternatives. Aware that there is <1% risk stroke, death, MI. Also discussed bleeding, limb ischemia, and allergy. She voiced understanding and willingness to proceed.  Appreciate all consultants help.  ? Myxoma resection later this week.  Signed letter sent to [facility] to facilitate  mother's travel from [place].  Signed, Sinclair Grooms 03/02/2015, 8:17 AM

## 2015-03-02 NOTE — Progress Notes (Signed)
Site area: LFA Site Prior to Removal:  Level 0 Pressure Applied For:95min Manual:   yes Patient Status During Pull:  stable Post Pull Site:  Level 0 Post Pull Instructions Given:  yes Post Pull Pulses Present: palpable Dressing Applied:  clear Bedrest begins @ 1930 Comments: Dr Roxy Manns in to speak to pt w/interpretor present

## 2015-03-02 NOTE — Progress Notes (Signed)
Interpreter Spero Geralds for [facility] and RN Karena Addison

## 2015-03-02 NOTE — CV Procedure (Signed)
CARDIAC CATHETERIZATION REPORT  NAME:  Debra Diaz   MRN: [id number] DOB:  [birth year]   ADMIT DATE: 02/26/2015 Procedure Date: 03/02/2015  INTERVENTIONAL CARDIOLOGIST: [name], M.D., MS PRIMARY CARE PROVIDER: No PCP Per Patient PRIMARY CARDIOLOGIST:  New to [facility];  Consult by Dr. Debara Pickett. Rounded on by Dr. Johnsie Cancel CARDIAC SURGEON: Dr. Roxy Manns  PATIENT:  Debra Diaz is a 39 y.o. Spanish only speaking female with a history of hypertension and type 2 diabetes as well as hypertension. She presented with sudden onset frontal temporal headache and central chest pressure. She noted left arm numbness and dyspnea. She noted 710 chest pain that improved 3 out 10 chest pain with ibuprofen. She had an elevated troponin level that did not reach greater than 2. 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  She has been evaluated by Dr. Roxy Manns from [facility] who recommended angiographic evaluation. Patient now posted for invasive cardiovascular evaluation with left heart catheterization diagnostic coronary angiography.. I was asked to use radial access of possible, in order to preserve femoral access for "Mid-CAP" cardiac surgery.  PRE-OPERATIVE DIAGNOSIS:    NSTEMI  Left Atrial Myxoma  PROCEDURES PERFORMED:    Left Heart Catheterization with Native Coronary Angiography  via Right Radial And Left Common Femoral Artery   Direct Ultrasound Guided Radial Artery Access  PROCEDURE: The patient was brought to the 2nd [facility] in the fasting state and prepped and draped in the usual sterile fashion for Right Radial artery access. A modified Allen's test was performed on the Right wrist demonstrating excellent collateral flow for radial access.   Sterile technique was used including antiseptics, cap, gloves, gown, hand hygiene, mask and sheet. Skin prep: Chlorhexidine.   Consent: Risks of procedure as well  as the alternatives and risks of each were explained to the (patient/caregiver). Consent for procedure obtained.   Time Out: Verified patient identification, verified procedure, site/side was marked, verified correct patient position, special equipment/implants available, medications/allergies/relevent history reviewed, required imaging and test results available. Performed.  Access:   Right Radial Artery: 6 Fr Sheath -  Seldinger Technique (Angiocath Micropuncture Kit)  Direct Ultrasound Guided Access after several failed attempts due to clotting of blood in the access needle  Radial Cocktail - 10 mL; IV Heparin 5000 Units   Left Common Femoral Artery: 5 Fr Sheath - fluoroscopically guided modified Seldinger technique  Left Heart Catheterization:   From radial access: 5 French TIG 4.0 catheter was advanced over long exchange safety wire into the ascending aorta. Unfortunately the catheter would not engage either the coronary arteries. The aortic valve was successfully crossed and left ventricular hemodynamics was obtained. Unfortunately, upon exchanging the TIG 4.0 catheter for a diagnostic JR4 catheter both 5 [place] and 4 [place], the patient had significant arm pain and vasospasm. Despite the administration of additional 3 mg of IA verapamil, the 4 French catheter was not able to be advanced beyond the mid brachial artery. At this point the decision was made to abandon radial approach and switch to left femoral approach in order to preserve the right femoral access for planned CT surgery.  Left femoral artery access: 5 Fr Catheters advanced or exchanged over a standard J-wire under fluoroscopic guidance; JL 3.5 catheter advanced first.  Left Coronary Artery Cineangiography: JL 3.5 Catheter  Right Coronary Artery Cineangiography: JR4 Catheter   LV Hemodynamics: TIG 4.0 catheter from radial approach   Right Radial Sheath was removed in  the CARDIAC CATHETERIZATION with TR Band placement for  hemostasis once the decision was made to abandon radial access.   TR Band: 1804  Hours; 9 mL air   Left femoral artery sheath was sutured in place as the ACT > 200 sec - to be removed in PACU holding area once ACT < 170 sec  FINDINGS:  Hemodynamics:   Central Aortic Pressure / Mean: 109/73/91  mmHg  Left Ventricular Pressure / LVEDP: 110/2/9  mmHg  Left Ventriculography: Deferred   Coronary Anatomy:  Dominance: Right   Left Main: Very Large caliber vessel that trifurcates into the LAD, Ramus Intermedius, and Left Circumflex. Angiographically normal  LAD: Large-caliber vessel with a very proximal large caliber High First Diagonal Branch. The LAD has mild diffuse tender 20% lesions but is relatively atrophic normal as it courses down around the apex perfusing the distal third of the inferoapex.   D1: Large caliber high branch with several distal branches. Mild possible luminal irregularities of less than 30%.   D2: Moderate caliber vessel that arises from the mid LAD. It does not cover large distribution but is angiographically normal.   Left Circumflex: Large-caliber, nondominant vessel that courses mostly as a large lateral bifurcating OM branch. The inferior branch is much larger than the superior branch. The distal vessel is tortuous and reaches down almost of the inferoapex. Mild luminal irregularity. There is a very small AV groove branch..   Ramus intermedius: Large caliber vessel that courses is a high OM. It gives off a moderate sized branch from the mid vessel just after a eccentric tubular 30% lesion.. The daughter vessel and parent both reaches almost to the apex. They are somewhat tortuous, but relatively free of disease.    RCA: Large-caliber codominant vessel that gives rise to a significant RV marginal branch in the mid vessel. It bifurcates distally into the Right Posterior Descending Artery  (RPDA) and the Right Posterior AV Groove Branch (RPAV).  Angiographically  normal.   RPDA: Large caliber vessel that reaches two thirds the way to the apex. Angiographically normal.   RPL Sysytem:The RPAV begins as a moderate large vessel that terminates as a moderate caliber posterolateral branch. Angiographically normal.   MEDICATIONS:  Anesthesia:  Local Lidocaine 2 mL for radial access, 16 mL for left femoral access   Sedation:  90m IV Versed, 100  mcg IV fentanyl ;   Omnipaque Contrast: 60  ml  Anticoagulation:  IV Heparin 5000  Units  Radial Cocktail: 5 mg Verapamil, 400 mcg NTG, 2 ml 2% Lidocaine in 10 ml NS IA Verapamil 3 mg Subcutaneous 100 g NTG  PATIENT DISPOSITION:    The patient was transferred to the PACU holding area in a hemodynamicaly stable, chest pain free condition.  The patient tolerated the procedure well, and there were no complications.  EBL:   < 10  ml  The patient was stable before, during, and after the procedure.  POST-OPERATIVE DIAGNOSIS:    Angiographically only minimal coronary artery disease, but for the most part angiographic normal coronary arteries.   Normal LVEDP   Significant right radial and brachial artery spasm   PLAN OF CARE:  Standard post radial and femoral cath care. Sheath will be removed in the PACU holding area.  Anticipate resection of left atrial myxoma as soon as Friday 3/18   Hoang Reich, DLeonie Green M.D., M.S. Interventional Cardiologist   Pager # [phone]

## 2015-03-02 NOTE — Clinical Social Work Note (Signed)
CSW sent instructions to patients brother in [place] on how to apply for emergency visa.  CSW will continue to follow.  Domenica Reamer, [place] Social Worker [phone]

## 2015-03-02 NOTE — Progress Notes (Signed)
Subjective: Patient currently HA free,  States when she coughs it will return but rated 1/10  Objective: Current vital signs: BP 114/68 mmHg  Pulse 65  Temp(Src) 98.6 F (37 C) (Oral)  Resp 18  Ht 5\' 1"  (1.549 m)  Wt 97.3 kg (214 lb 8.1 oz)  BMI 40.55 kg/m2  SpO2 98%  LMP 02/19/2015 Vital signs in last 24 hours: Temp:  [98.4 F (36.9 C)-99.2 F (37.3 C)] 98.6 F (37 C) (03/15 0515) Pulse Rate:  [64-94] 65 (03/15 0515) Resp:  [18-24] 18 (03/15 0515) BP: (106-172)/(66-109) 114/68 mmHg (03/15 0515) SpO2:  [95 %-100 %] 98 % (03/15 0515)  Intake/Output from previous day: 03/14 0701 - 03/15 0700 In: 240 [P.O.:240] Out: 550 [Urine:550] Intake/Output this shift:   Nutritional status: Diet NPO time specified  Neurologic Exam: General: NAD Mental Status: Alert, oriented, thought content appropriate.  Speech fluent without evidence of aphasia.  Able to follow 3 step commands without difficulty. Cranial Nerves: II: Discs flat bilaterally; Visual fields grossly normal, pupils equal, round, reactive to light and accommodation III,IV, VI: ptosis not present, extra-ocular motions intact bilaterally V,VII: smile symmetric, facial light touch sensation normal bilaterally VIII: hearing normal bilaterally IX,X: uvula rises symmetrically XI: bilateral shoulder shrug XII: midline tongue extension without atrophy or fasciculations  Motor: Right : Upper extremity   5/5    Left:     Upper extremity   5/5  Lower extremity   5/5     Lower extremity   5/5 Tone and bulk:normal tone throughout; no atrophy noted Sensory: Pinprick and light touch intact throughout, bilaterally Deep Tendon Reflexes:  Right: Upper Extremity   Left: Upper extremity   biceps (C-5 to C-6) 2/4   biceps (C-5 to C-6) 2/4 tricep (C7) 2/4    triceps (C7) 2/4 Brachioradialis (C6) 2/4  Brachioradialis (C6) 2/4  Lower Extremity Lower Extremity  quadriceps (L-2 to L-4) 2/4   quadriceps (L-2 to L-4) 2/4 Achilles (S1)  2/4   Achilles (S1) 2/4  Plantars: Right: downgoing   Left: downgoing    Lab Results: Basic Metabolic Panel:  Recent Labs Lab 02/26/15 1546 02/27/15 0330 03/01/15 0520  NA 137 140 138  K 3.3* 3.4* 3.9  CL 102 107 106  CO2 25 24 29   GLUCOSE 99 103* 112*  BUN 13 8 8   CREATININE 0.65 0.54 0.56  CALCIUM 9.1 8.2* 8.5  MG  --  2.0  --   PHOS  --  4.0  --     Liver Function Tests:  Recent Labs Lab 02/26/15 1546 02/27/15 0330  AST 27 34  ALT 26 22  ALKPHOS 81 66  BILITOT 0.3 0.3  PROT 7.7 6.6  ALBUMIN 4.1 3.1*   No results for input(s): LIPASE, AMYLASE in the last 168 hours. No results for input(s): AMMONIA in the last 168 hours.  CBC:  Recent Labs Lab 02/26/15 1546 02/27/15 0330  WBC 12.0* 10.9*  NEUTROABS 8.1*  --   HGB 12.6 11.4*  HCT 40.3 35.9*  MCV 80.9 81.6  PLT 334 295    Cardiac Enzymes:  Recent Labs Lab 02/26/15 1546 02/27/15 0736 02/27/15 1257 02/27/15 1944  TROPONINI 0.44* 2.36* 1.63* 1.24*    Lipid Panel: No results for input(s): CHOL, TRIG, HDL, CHOLHDL, VLDL, LDLCALC in the last 168 hours.  CBG:  Recent Labs Lab 02/28/15 2147 03/01/15 0648 03/01/15 1129 03/01/15 1628 03/01/15 2212  GLUCAP 99 119* 104* 93 100*    Microbiology: No results found for this or  any previous visit.  Coagulation Studies:  Recent Labs  03/01/15 0520  LABPROT 14.3  INR 1.09    Imaging: Mr Jodene Nam Head Wo Contrast  03/02/2015   CLINICAL DATA:  Initial evaluation for headache. History of likely left atrial myxoma.  EXAM: MRI HEAD WITHOUT CONTRAST  MRA HEAD WITHOUT CONTRAST  TECHNIQUE: Multiplanar, multiecho pulse sequences of the brain and surrounding structures were obtained without intravenous contrast. Angiographic images of the head were obtained using MRA technique without contrast.  COMPARISON:  Prior study from 02/26/2015  FINDINGS: MRI HEAD FINDINGS  The CSF containing spaces are within normal limits for patient age. No focal parenchymal  signal abnormality is identified. No mass lesion, midline shift, or extra-axial fluid collection. Ventricles are normal in size without evidence of hydrocephalus.  No diffusion-weighted signal abnormality is identified to suggest acute intracranial infarct. Gray-white matter differentiation is maintained. Normal flow voids are seen within the intracranial vasculature. No intracranial hemorrhage identified.  The cervicomedullary junction is normal. Pituitary gland is within normal limits. Pituitary stalk is midline. The globes and optic nerves demonstrate a normal appearance with normal signal intensity. The  The bone marrow signal intensity is normal. Calvarium is intact. Visualized upper cervical spine is within normal limits.  Scalp soft tissues are unremarkable.  Minimal mucoperiosteal thickening present within the left maxillary sinus, left sphenoid sinus, and left-sided ethmoidal air cells. No mastoid effusion.  MRA HEAD FINDINGS  ANTERIOR CIRCULATION:  The visualized portions of the distal cervical internal carotid arteries are widely patent with antegrade flow. The petrous, cavernous, and supra clinoid segments are symmetric in caliber bilaterally. A1 segments, anterior communicating artery, and anterior cerebral arteries are widely patent. Middle cerebral arteries are widely patent with antegrade flow without high-grade flow-limiting stenosis or proximal branch occlusion. Distal MCA branches are symmetric bilaterally. No intracranial aneurysm within the anterior circulation.  POSTERIOR CIRCULATION:  The vertebral arteries are widely patent with antegrade flow. The posterior inferior cerebral arteries are normal. Vertebrobasilar junction and basilar artery are widely patent with antegrade flow without evidence of basilar tip stenosis or aneurysm. Posterior cerebral arteries are normal bilaterally. The superior cerebellar arteries and anterior inferior cerebellar arteries are widely patent bilaterally. No  intracranial aneurysm within the posterior circulation.  IMPRESSION: MRI HEAD IMPRESSION:  Normal MRI of the brain with no acute intracranial infarct or other abnormality identified.  MRA HEAD IMPRESSION:  Normal MRA of the intracranial circulation.   Electronically Signed   By: Jeannine Boga M.D.   On: 03/02/2015 06:10   Mr Brain Wo Contrast  03/02/2015   CLINICAL DATA:  Initial evaluation for headache. History of likely left atrial myxoma.  EXAM: MRI HEAD WITHOUT CONTRAST  MRA HEAD WITHOUT CONTRAST  TECHNIQUE: Multiplanar, multiecho pulse sequences of the brain and surrounding structures were obtained without intravenous contrast. Angiographic images of the head were obtained using MRA technique without contrast.  COMPARISON:  Prior study from 02/26/2015  FINDINGS: MRI HEAD FINDINGS  The CSF containing spaces are within normal limits for patient age. No focal parenchymal signal abnormality is identified. No mass lesion, midline shift, or extra-axial fluid collection. Ventricles are normal in size without evidence of hydrocephalus.  No diffusion-weighted signal abnormality is identified to suggest acute intracranial infarct. Gray-white matter differentiation is maintained. Normal flow voids are seen within the intracranial vasculature. No intracranial hemorrhage identified.  The cervicomedullary junction is normal. Pituitary gland is within normal limits. Pituitary stalk is midline. The globes and optic nerves demonstrate a normal appearance with normal  signal intensity. The  The bone marrow signal intensity is normal. Calvarium is intact. Visualized upper cervical spine is within normal limits.  Scalp soft tissues are unremarkable.  Minimal mucoperiosteal thickening present within the left maxillary sinus, left sphenoid sinus, and left-sided ethmoidal air cells. No mastoid effusion.  MRA HEAD FINDINGS  ANTERIOR CIRCULATION:  The visualized portions of the distal cervical internal carotid arteries are  widely patent with antegrade flow. The petrous, cavernous, and supra clinoid segments are symmetric in caliber bilaterally. A1 segments, anterior communicating artery, and anterior cerebral arteries are widely patent. Middle cerebral arteries are widely patent with antegrade flow without high-grade flow-limiting stenosis or proximal branch occlusion. Distal MCA branches are symmetric bilaterally. No intracranial aneurysm within the anterior circulation.  POSTERIOR CIRCULATION:  The vertebral arteries are widely patent with antegrade flow. The posterior inferior cerebral arteries are normal. Vertebrobasilar junction and basilar artery are widely patent with antegrade flow without evidence of basilar tip stenosis or aneurysm. Posterior cerebral arteries are normal bilaterally. The superior cerebellar arteries and anterior inferior cerebellar arteries are widely patent bilaterally. No intracranial aneurysm within the posterior circulation.  IMPRESSION: MRI HEAD IMPRESSION:  Normal MRI of the brain with no acute intracranial infarct or other abnormality identified.  MRA HEAD IMPRESSION:  Normal MRA of the intracranial circulation.   Electronically Signed   By: Jeannine Boga M.D.   On: 03/02/2015 06:10    Medications:  Scheduled: . heparin  5,000 Units Subcutaneous 3 times per day  . insulin aspart  0-5 Units Subcutaneous QHS  . insulin aspart  0-9 Units Subcutaneous TID WC  . polyethylene glycol  17 g Oral Daily  . sodium chloride  3 mL Intravenous Q12H    Assessment/Plan:  38y/o woman history of HTN, DM admitted with headache, chest pain found to have suspected left atrial myxoma. Neurological exam is unremarkable. Suspect headache is likely migraine headache. MRI brain shows no acute infarct. Patient currently HA free. At this time neurology will S/O.  Jim Like, DO [facility] [phone]  If 7pm- 7am, please page neurology on call as listed in [place].

## 2015-03-02 NOTE — Progress Notes (Addendum)
       Patient Name: Debra Diaz Date of Encounter: 03/02/2015    SUBJECTIVE:Still with intermittent headache and nausea.   TELEMETRY:  NSR Filed Vitals:   03/01/15 1610 03/01/15 1630 03/01/15 2213 03/02/15 0515  BP: 131/74 120/78 118/80 114/68  Pulse: 75 77 70 65  Temp:  98.7 F (37.1 C) 98.7 F (37.1 C) 98.6 F (37 C)  TempSrc:  Oral Oral Oral  Resp: 22 20 18 18   Height:      Weight:      SpO2: 99% 95% 97% 98%    Intake/Output Summary (Last 24 hours) at 03/02/15 0817 Last data filed at 03/01/15 1700  Gross per 24 hour  Intake    240 ml  Output    550 ml  Net   -310 ml   LABS: Basic Metabolic Panel:  Recent Labs  03/01/15 0520  NA 138  K 3.9  CL 106  CO2 29  GLUCOSE 112*  BUN 8  CREATININE 0.56  CALCIUM 8.5   CBC: No results for input(s): WBC, NEUTROABS, HGB, HCT, MCV, PLT in the last 72 hours. Cardiac Enzymes:  Recent Labs  02/27/15 1257 02/27/15 1944  TROPONINI 1.63* 1.24*     Radiology/Studies:  MRI/A brain is normal.  Physical Exam: Blood pressure 114/68, pulse 65, temperature 98.6 F (37 C), temperature source Oral, resp. rate 18, height 5\' 1"  (1.549 m), weight 214 lb 8.1 oz (97.3 kg), last menstrual period 02/19/2015, SpO2 98 %. Weight change:   Wt Readings from Last 3 Encounters:  02/27/15 214 lb 8.1 oz (97.3 kg)  07/31/14 200 lb (90.719 kg)    No murmur or gallop.  ASSESSMENT:  1. Left atrial Myxoma 2. ACS due to suspected embolic phenomena 3. Headache and nausea of uncertain cause. Brain OK by MRI  Plan:   Plan coronary angiography today. Consent obtained from the patient who speaks good english. Described procedure, risks, and alternatives. Aware that there is <1% risk stroke, death, MI. Also discussed bleeding, limb ischemia, and allergy. She voiced understanding and willingness to proceed.  Appreciate all consultants help.  ? Myxoma resection later this week.  Signed letter sent to [facility] to facilitate  mother's travel from [place].  Signed, Sinclair Grooms 03/02/2015, 8:17 AM

## 2015-03-02 NOTE — Interval H&P Note (Signed)
History and Physical Interval Note:  03/02/2015 5:04 PM  Debra Diaz  has presented today for surgery, with the diagnosis of NSTEMI - Cardiac Mass; Pre-op.   The various methods of treatment have been discussed with the patient and family. After consideration of risks, benefits and other options for treatment, the patient has consented to  Procedure(s): LEFT HEART CATHETERIZATION WITH CORONARY ANGIOGRAM (N/A) as a surgical intervention .  The patient's history has been reviewed, patient examined, no change in status, stable for surgery.  I have reviewed the patient's chart and labs.  Questions were answered to the patient's satisfaction.     Tanuj Mullens W

## 2015-03-02 NOTE — Progress Notes (Signed)
PATIENT ON BEDREST POST-CATH. ASSESSMENT WNL. SKIN WARM AND DRY. PULSES EASILY PALPABLE IN ALL 4 EXTREMITIES. SITE FREE OF BLEEDING. SENSATION NORMAL WITH SOME SORENESS. POSITIVE ALLEN'S TEST. REMAINING 3CC OF AIR REMOVED FROM BAND. BAND REPLACED WITH GAUZE AND TEGADERM DRESSING. LEFT GROIN SITE WNL/LEVEL 0.  PATIENT COMPLIANT WITH LIMITING MOVEMENT OF LEFT LEG AND RIGHT ARM.  SON AT BEDSIDE. [place].  MEDICATED WITH MORPHINE FOR PAIN.

## 2015-03-03 ENCOUNTER — Encounter (HOSPITAL_COMMUNITY): Payer: Self-pay | Admitting: Cardiology

## 2015-03-03 DIAGNOSIS — D219 Benign neoplasm of connective and other soft tissue, unspecified: Secondary | ICD-10-CM

## 2015-03-03 LAB — GLUCOSE, CAPILLARY
Glucose-Capillary: 82 mg/dL (ref 70–99)
Glucose-Capillary: 93 mg/dL (ref 70–99)
Glucose-Capillary: 111 mg/dL — ABNORMAL HIGH (ref 70–99)
Glucose-Capillary: 104 mg/dL — ABNORMAL HIGH (ref 70–99)

## 2015-03-03 LAB — TYPE AND SCREEN
ABO/RH(D): O POS
Antibody Screen: NEGATIVE

## 2015-03-03 LAB — SURGICAL PCR SCREEN
MRSA, PCR: NEGATIVE
Staphylococcus aureus: NEGATIVE

## 2015-03-03 LAB — ABO/RH: ABO/RH(D): O POS

## 2015-03-03 MED ORDER — VANCOMYCIN HCL 1000 MG IV SOLR
INTRAVENOUS | Status: AC
  Administered 2015-03-04: 12:00:00
  Filled 2015-03-03: qty 1000

## 2015-03-03 MED ORDER — DOPAMINE-DEXTROSE 3.2-5 MG/ML-% IV SOLN
0.0000 ug/kg/min | INTRAVENOUS | Status: DC
Start: 2015-03-04 — End: 2015-03-04
  Filled 2015-03-03: qty 250

## 2015-03-03 MED ORDER — CHLORHEXIDINE GLUCONATE 4 % EX LIQD
60.0000 mL | Freq: Once | CUTANEOUS | Status: DC
  Filled 2015-03-03: qty 60

## 2015-03-03 MED ORDER — EPINEPHRINE HCL 1 MG/ML IJ SOLN
0.0000 ug/min | INTRAMUSCULAR | Status: DC
  Filled 2015-03-03: qty 4

## 2015-03-03 MED ORDER — DEXTROSE 5 % IV SOLN
1.5000 g | INTRAVENOUS | Status: AC
  Administered 2015-03-04: 1.5 g via INTRAVENOUS
  Administered 2015-03-04: .75 g via INTRAVENOUS
  Filled 2015-03-03: qty 1.5

## 2015-03-03 MED ORDER — BISACODYL 5 MG PO TBEC
5.0000 mg | DELAYED_RELEASE_TABLET | Freq: Once | ORAL | Status: AC
  Administered 2015-03-03: 5 mg via ORAL
  Filled 2015-03-03: qty 1

## 2015-03-03 MED ORDER — NITROGLYCERIN IN D5W 200-5 MCG/ML-% IV SOLN
2.0000 ug/min | INTRAVENOUS | Status: DC
  Filled 2015-03-03: qty 250

## 2015-03-03 MED ORDER — DEXTROSE 5 % IV SOLN
750.0000 mg | INTRAVENOUS | Status: DC
  Filled 2015-03-03: qty 750

## 2015-03-03 MED ORDER — PLASMA-LYTE 148 IV SOLN
INTRAVENOUS | Status: DC
  Filled 2015-03-03: qty 2.5

## 2015-03-03 MED ORDER — HYDROCHLOROTHIAZIDE 12.5 MG PO CAPS
12.5000 mg | ORAL_CAPSULE | Freq: Every day | ORAL | Status: DC
  Administered 2015-03-03: 12.5 mg via ORAL
  Filled 2015-03-03 (×2): qty 1

## 2015-03-03 MED ORDER — DEXMEDETOMIDINE HCL IN NACL 400 MCG/100ML IV SOLN
0.1000 ug/kg/h | INTRAVENOUS | Status: DC
  Filled 2015-03-03 (×2): qty 100

## 2015-03-03 MED ORDER — SODIUM CHLORIDE 0.9 % IV SOLN
INTRAVENOUS | Status: DC
  Filled 2015-03-03: qty 30

## 2015-03-03 MED ORDER — SODIUM CHLORIDE 0.9 % IV SOLN
INTRAVENOUS | Status: DC
  Filled 2015-03-03: qty 40

## 2015-03-03 MED ORDER — PHENYLEPHRINE HCL 10 MG/ML IJ SOLN
30.0000 ug/min | INTRAVENOUS | Status: DC
  Filled 2015-03-03: qty 2

## 2015-03-03 MED ORDER — SODIUM CHLORIDE 0.9 % IV SOLN
INTRAVENOUS | Status: DC
  Filled 2015-03-03: qty 2.5

## 2015-03-03 MED ORDER — MAGNESIUM SULFATE 50 % IJ SOLN
40.0000 meq | INTRAMUSCULAR | Status: DC
  Filled 2015-03-03: qty 10

## 2015-03-03 MED ORDER — POTASSIUM CHLORIDE 2 MEQ/ML IV SOLN
80.0000 meq | INTRAVENOUS | Status: DC
  Filled 2015-03-03: qty 40

## 2015-03-03 MED ORDER — TEMAZEPAM 15 MG PO CAPS
15.0000 mg | ORAL_CAPSULE | Freq: Once | ORAL | Status: AC | PRN
  Administered 2015-03-03: 15 mg via ORAL
  Filled 2015-03-03: qty 1

## 2015-03-03 MED ORDER — LISINOPRIL 20 MG PO TABS
20.0000 mg | ORAL_TABLET | Freq: Every day | ORAL | Status: DC
  Administered 2015-03-03: 20 mg via ORAL
  Filled 2015-03-03 (×2): qty 1

## 2015-03-03 MED ORDER — VANCOMYCIN HCL 10 G IV SOLR
1500.0000 mg | INTRAVENOUS | Status: AC
  Administered 2015-03-04: 1500 mg via INTRAVENOUS
  Filled 2015-03-03 (×2): qty 1500

## 2015-03-03 MED ORDER — METOPROLOL TARTRATE 12.5 MG HALF TABLET
12.5000 mg | ORAL_TABLET | Freq: Once | ORAL | Status: AC
  Administered 2015-03-04: 12.5 mg via ORAL
  Filled 2015-03-03: qty 1

## 2015-03-03 MED ORDER — CHLORHEXIDINE GLUCONATE 4 % EX LIQD
60.0000 mL | Freq: Once | CUTANEOUS | Status: DC
  Filled 2015-03-03 (×2): qty 60

## 2015-03-03 MED ORDER — ALPRAZOLAM 0.25 MG PO TABS: 0.2500 mg | ORAL_TABLET | Freq: Three times a day (TID) | ORAL | Status: DC | PRN

## 2015-03-03 NOTE — Progress Notes (Signed)
CARDIAC REHAB PHASE I   PRE:  Rate/Rhythm: 76     BP: sitting 150/100 left, 150/96 right    SaO2: 98 RA  MODE:  Ambulation: 550 ft   POST:  Rate/Rhythm: 105 ST    BP: sitting 144/96 right     SaO2: 98 RA  Pt BP elevated. She sts she is scared. Many family members in room. No problems walking, BP slightly better.  Notified RN who sts she doesn't have any BP meds ordered. Discussed in Spanish sternal precautions (if sternotomy), mobility, 24/7 care once d/c'd, and IS. Voiced understanding. Son present to ask questions. Family sts they will be with her at d/c. [phone]  Darrick Meigs CES, ACSM 03/03/2015 11:00 AM

## 2015-03-03 NOTE — Progress Notes (Signed)
PATIENT AWAKE AND IN GOOD SPIRITS. NO REQUESTS AT THIS TIME.  CATH SITES WNL. SKIN WARM AND DRY. PULSES PALPABLE IN ALL 4 EXTREMITIES. SENSATION WNL.

## 2015-03-03 NOTE — Progress Notes (Signed)
TCTS BRIEF PROGRESS NOTE   Clinically stable.  No chest pain.  No further headaches. For OR in am tomorrow.  All questions answered.  [name] 03/03/2015 7:13 PM

## 2015-03-03 NOTE — Progress Notes (Signed)
Pre-op Cardiac Surgery  Carotid Findings:  Bilateral:  1-39% ICA stenosis.  Vertebral artery flow is antegrade.      Upper Extremity Right Left  Brachial Pressures 134 129  Radial Waveforms Tri Tri  Ulnar Waveforms Tri Tri  Palmar Arch (Allen's Test) Obliterates with radial compression, normal with ulnar compression Decreases >50% with radial compression, normal with ulnar compression   Landry Mellow, RDMS, RVT 03/03/2015

## 2015-03-03 NOTE — Progress Notes (Signed)
       Patient Name: Debra Diaz Date of Encounter: 03/03/2015    SUBJECTIVE: Feels well.  TELEMETRY:  NSR Filed Vitals:   03/02/15 2112 03/02/15 2145 03/02/15 2215 03/03/15 0543  BP: 125/78 103/66 106/59 122/79  Pulse: 75 68 65 60  Temp: 98.8 F (37.1 C)   98.8 F (37.1 C)  TempSrc: Oral   Oral  Resp: 18 20 21 20   Height:      Weight:      SpO2: 99% 97% 97% 97%    Intake/Output Summary (Last 24 hours) at 03/03/15 0748 Last data filed at 03/03/15 0245  Gross per 24 hour  Intake      3 ml  Output    300 ml  Net   -297 ml   LABS: Basic Metabolic Panel:  Recent Labs  03/01/15 0520  NA 138  K 3.9  CL 106  CO2 29  GLUCOSE 112*  BUN 8  CREATININE 0.56  CALCIUM 8.5      Radiology/Studies:  MRI/A brain is normal.  Physical Exam: Blood pressure 122/79, pulse 60, temperature 98.8 F (37.1 C), temperature source Oral, resp. rate 20, height 5\' 1"  (1.549 m), weight 214 lb 8.1 oz (97.3 kg), last menstrual period 02/19/2015, SpO2 97 %. Weight change:   Wt Readings from Last 3 Encounters:  02/27/15 214 lb 8.1 oz (97.3 kg)  07/31/14 200 lb (90.719 kg)    No murmur or gallop.  ASSESSMENT:  1. Left atrial Myxoma 2. ACS due to suspected embolic phenomena. No coronary obstruction found. 3. Headache and nausea resolved  Plan:   Myxoma resection tomorrow per Dr Roxy Manns.  Signed letter sent to [facility] to facilitate mother's travel from [place].  Signed, Sinclair Grooms 03/03/2015, 7:48 AM

## 2015-03-04 ENCOUNTER — Inpatient Hospital Stay (HOSPITAL_COMMUNITY): Payer: No Typology Code available for payment source | Admitting: Certified Registered Nurse Anesthetist

## 2015-03-04 ENCOUNTER — Inpatient Hospital Stay (HOSPITAL_COMMUNITY): Payer: No Typology Code available for payment source

## 2015-03-04 ENCOUNTER — Encounter (HOSPITAL_COMMUNITY): Payer: Self-pay | Admitting: Thoracic Surgery (Cardiothoracic Vascular Surgery)

## 2015-03-04 ENCOUNTER — Encounter (HOSPITAL_COMMUNITY)
Admission: EM | Disposition: A | Discharge status: Home / Self Care | Payer: No Typology Code available for payment source | Source: Home / Self Care | Attending: Internal Medicine

## 2015-03-04 DIAGNOSIS — D151 Benign neoplasm of heart: Principal | ICD-10-CM

## 2015-03-04 HISTORY — PX: MINIMALLY INVASIVE EXCISION OF ATRIAL MYXOMA: SHX5974

## 2015-03-04 HISTORY — DX: Benign neoplasm of heart: D15.1

## 2015-03-04 HISTORY — PX: TEE WITHOUT CARDIOVERSION: SHX5443

## 2015-03-04 LAB — POCT I-STAT 4, (NA,K, GLUC, HGB,HCT)
Glucose, Bld: 107 mg/dL — ABNORMAL HIGH (ref 70–99)
HCT: 33 % — ABNORMAL LOW (ref 36.0–46.0)
Hemoglobin: 11.2 g/dL — ABNORMAL LOW (ref 12.0–15.0)
Potassium: 3.8 mmol/L (ref 3.5–5.1)
Sodium: 143 mmol/L (ref 135–145)

## 2015-03-04 LAB — GLUCOSE, CAPILLARY
Glucose-Capillary: 119 mg/dL — ABNORMAL HIGH (ref 70–99)
Glucose-Capillary: 102 mg/dL — ABNORMAL HIGH (ref 70–99)
Glucose-Capillary: 79 mg/dL (ref 70–99)
Glucose-Capillary: 97 mg/dL (ref 70–99)
Glucose-Capillary: 119 mg/dL — ABNORMAL HIGH (ref 70–99)
Glucose-Capillary: 108 mg/dL — ABNORMAL HIGH (ref 70–99)
Glucose-Capillary: 114 mg/dL — ABNORMAL HIGH (ref 70–99)
Glucose-Capillary: 115 mg/dL — ABNORMAL HIGH (ref 70–99)
Glucose-Capillary: 116 mg/dL — ABNORMAL HIGH (ref 70–99)
Glucose-Capillary: 141 mg/dL — ABNORMAL HIGH (ref 70–99)

## 2015-03-04 LAB — POCT I-STAT, CHEM 8
BUN: 5 mg/dL — ABNORMAL LOW (ref 6–23)
BUN: 4 mg/dL — ABNORMAL LOW (ref 6–23)
BUN: 4 mg/dL — ABNORMAL LOW (ref 6–23)
BUN: 6 mg/dL (ref 6–23)
BUN: 6 mg/dL (ref 6–23)
BUN: 6 mg/dL (ref 6–23)
Calcium, Ion: 1.19 mmol/L (ref 1.12–1.23)
Calcium, Ion: 1.19 mmol/L (ref 1.12–1.23)
Calcium, Ion: 1.04 mmol/L — ABNORMAL LOW (ref 1.12–1.23)
Calcium, Ion: 1.13 mmol/L (ref 1.12–1.23)
Calcium, Ion: 1.11 mmol/L — ABNORMAL LOW (ref 1.12–1.23)
Calcium, Ion: 1.16 mmol/L (ref 1.12–1.23)
Chloride: 102 mmol/L (ref 96–112)
Chloride: 106 mmol/L (ref 96–112)
Chloride: 98 mmol/L (ref 96–112)
Chloride: 106 mmol/L (ref 96–112)
Chloride: 105 mmol/L (ref 96–112)
Chloride: 108 mmol/L (ref 96–112)
Creatinine, Ser: 0.4 mg/dL — ABNORMAL LOW (ref 0.50–1.10)
Creatinine, Ser: 0.5 mg/dL (ref 0.50–1.10)
Creatinine, Ser: 0.4 mg/dL — ABNORMAL LOW (ref 0.50–1.10)
Creatinine, Ser: 0.4 mg/dL — ABNORMAL LOW (ref 0.50–1.10)
Creatinine, Ser: 0.5 mg/dL (ref 0.50–1.10)
Creatinine, Ser: 0.5 mg/dL (ref 0.50–1.10)
Glucose, Bld: 128 mg/dL — ABNORMAL HIGH (ref 70–99)
Glucose, Bld: 118 mg/dL — ABNORMAL HIGH (ref 70–99)
Glucose, Bld: 111 mg/dL — ABNORMAL HIGH (ref 70–99)
Glucose, Bld: 166 mg/dL — ABNORMAL HIGH (ref 70–99)
Glucose, Bld: 152 mg/dL — ABNORMAL HIGH (ref 70–99)
Glucose, Bld: 131 mg/dL — ABNORMAL HIGH (ref 70–99)
HCT: 33 % — ABNORMAL LOW (ref 36.0–46.0)
HCT: 33 % — ABNORMAL LOW (ref 36.0–46.0)
HCT: 27 % — ABNORMAL LOW (ref 36.0–46.0)
HCT: 28 % — ABNORMAL LOW (ref 36.0–46.0)
HCT: 29 % — ABNORMAL LOW (ref 36.0–46.0)
HCT: 32 % — ABNORMAL LOW (ref 36.0–46.0)
Hemoglobin: 11.2 g/dL — ABNORMAL LOW (ref 12.0–15.0)
Hemoglobin: 11.2 g/dL — ABNORMAL LOW (ref 12.0–15.0)
Hemoglobin: 9.2 g/dL — ABNORMAL LOW (ref 12.0–15.0)
Hemoglobin: 9.5 g/dL — ABNORMAL LOW (ref 12.0–15.0)
Hemoglobin: 9.9 g/dL — ABNORMAL LOW (ref 12.0–15.0)
Hemoglobin: 10.9 g/dL — ABNORMAL LOW (ref 12.0–15.0)
Potassium: 3.4 mmol/L — ABNORMAL LOW (ref 3.5–5.1)
Potassium: 3.5 mmol/L (ref 3.5–5.1)
Potassium: 3.6 mmol/L (ref 3.5–5.1)
Potassium: 4.6 mmol/L (ref 3.5–5.1)
Potassium: 4.5 mmol/L (ref 3.5–5.1)
Potassium: 4.4 mmol/L (ref 3.5–5.1)
Sodium: 140 mmol/L (ref 135–145)
Sodium: 142 mmol/L (ref 135–145)
Sodium: 139 mmol/L (ref 135–145)
Sodium: 142 mmol/L (ref 135–145)
Sodium: 142 mmol/L (ref 135–145)
Sodium: 142 mmol/L (ref 135–145)
TCO2: 23 mmol/L (ref 0–100)
TCO2: 22 mmol/L (ref 0–100)
TCO2: 20 mmol/L (ref 0–100)
TCO2: 22 mmol/L (ref 0–100)
TCO2: 19 mmol/L (ref 0–100)
TCO2: 20 mmol/L (ref 0–100)

## 2015-03-04 LAB — CBC
HCT: 38.5 % (ref 36.0–46.0)
HCT: 32.9 % — ABNORMAL LOW (ref 36.0–46.0)
HCT: 31.9 % — ABNORMAL LOW (ref 36.0–46.0)
Hemoglobin: 12.1 g/dL (ref 12.0–15.0)
Hemoglobin: 10.6 g/dL — ABNORMAL LOW (ref 12.0–15.0)
Hemoglobin: 10.2 g/dL — ABNORMAL LOW (ref 12.0–15.0)
MCH: 25.6 pg — ABNORMAL LOW (ref 26.0–34.0)
MCH: 26.2 pg (ref 26.0–34.0)
MCH: 25.8 pg — ABNORMAL LOW (ref 26.0–34.0)
MCHC: 31.4 g/dL (ref 30.0–36.0)
MCHC: 32.2 g/dL (ref 30.0–36.0)
MCHC: 32 g/dL (ref 30.0–36.0)
MCV: 81.4 fL (ref 78.0–100.0)
MCV: 81.2 fL (ref 78.0–100.0)
MCV: 80.8 fL (ref 78.0–100.0)
Platelets: 310 10*3/uL (ref 150–400)
Platelets: 156 10*3/uL (ref 150–400)
Platelets: 162 10*3/uL (ref 150–400)
RBC: 4.73 MIL/uL (ref 3.87–5.11)
RBC: 4.05 MIL/uL (ref 3.87–5.11)
RBC: 3.95 MIL/uL (ref 3.87–5.11)
RDW: 15.4 % (ref 11.5–15.5)
RDW: 15.3 % (ref 11.5–15.5)
RDW: 15.3 % (ref 11.5–15.5)
WBC: 10.1 10*3/uL (ref 4.0–10.5)
WBC: 16.7 10*3/uL — ABNORMAL HIGH (ref 4.0–10.5)
WBC: 12.6 10*3/uL — ABNORMAL HIGH (ref 4.0–10.5)

## 2015-03-04 LAB — POCT I-STAT 3, ART BLOOD GAS (G3+)
Acid-base deficit: 1 mmol/L (ref 0.0–2.0)
Acid-base deficit: 2 mmol/L (ref 0.0–2.0)
Acid-base deficit: 5 mmol/L — ABNORMAL HIGH (ref 0.0–2.0)
Acid-base deficit: 4 mmol/L — ABNORMAL HIGH (ref 0.0–2.0)
Bicarbonate: 23 mEq/L (ref 20.0–24.0)
Bicarbonate: 22.3 mEq/L (ref 20.0–24.0)
Bicarbonate: 21.2 mEq/L (ref 20.0–24.0)
Bicarbonate: 22.8 mEq/L (ref 20.0–24.0)
O2 Saturation: 100 %
O2 Saturation: 98 %
O2 Saturation: 97 %
O2 Saturation: 98 %
Patient temperature: 35.4
Patient temperature: 37.3
Patient temperature: 37.1
TCO2: 24 mmol/L (ref 0–100)
TCO2: 23 mmol/L (ref 0–100)
TCO2: 22 mmol/L (ref 0–100)
TCO2: 24 mmol/L (ref 0–100)
pCO2 arterial: 35.2 mmHg (ref 35.0–45.0)
pCO2 arterial: 34.6 mmHg — ABNORMAL LOW (ref 35.0–45.0)
pCO2 arterial: 42.3 mmHg (ref 35.0–45.0)
pCO2 arterial: 45.9 mmHg — ABNORMAL HIGH (ref 35.0–45.0)
pH, Arterial: 7.423 (ref 7.350–7.450)
pH, Arterial: 7.411 (ref 7.350–7.450)
pH, Arterial: 7.31 — ABNORMAL LOW (ref 7.350–7.450)
pH, Arterial: 7.304 — ABNORMAL LOW (ref 7.350–7.450)
pO2, Arterial: 268 mmHg — ABNORMAL HIGH (ref 80.0–100.0)
pO2, Arterial: 102 mmHg — ABNORMAL HIGH (ref 80.0–100.0)
pO2, Arterial: 101 mmHg — ABNORMAL HIGH (ref 80.0–100.0)
pO2, Arterial: 117 mmHg — ABNORMAL HIGH (ref 80.0–100.0)

## 2015-03-04 LAB — BASIC METABOLIC PANEL
Anion gap: 10 (ref 5–15)
BUN: 8 mg/dL (ref 6–23)
CO2: 27 mmol/L (ref 19–32)
Calcium: 9.1 mg/dL (ref 8.4–10.5)
Chloride: 101 mmol/L (ref 96–112)
Creatinine, Ser: 0.66 mg/dL (ref 0.50–1.10)
GFR calc Af Amer: >90 mL/min (ref >90)
GFR calc non Af Amer: >90 mL/min (ref >90)
Glucose, Bld: 108 mg/dL — ABNORMAL HIGH (ref 70–99)
Potassium: 3.7 mmol/L (ref 3.5–5.1)
Sodium: 138 mmol/L (ref 135–145)

## 2015-03-04 LAB — BLOOD GAS, ARTERIAL
Acid-Base Excess: 1.7 mmol/L (ref 0.0–2.0)
Bicarbonate: 25.4 mEq/L — ABNORMAL HIGH (ref 20.0–24.0)
Drawn by: 39898
FIO2: 0.21 %
O2 Saturation: 97.7 %
Patient temperature: 98.6
TCO2: 26.5 mmol/L (ref 0–100)
pCO2 arterial: 36.9 mmHg (ref 35.0–45.0)
pH, Arterial: 7.451 — ABNORMAL HIGH (ref 7.350–7.450)
pO2, Arterial: 97.3 mmHg (ref 80.0–100.0)

## 2015-03-04 LAB — PROTIME-INR
INR: 1.32 (ref 0.00–1.49)
Prothrombin Time: 16.5 seconds — ABNORMAL HIGH (ref 11.6–15.2)

## 2015-03-04 LAB — POCT I-STAT GLUCOSE
Glucose, Bld: 140 mg/dL — ABNORMAL HIGH (ref 70–99)
Operator id: 3406

## 2015-03-04 LAB — CREATININE, SERUM
Creatinine, Ser: 0.58 mg/dL (ref 0.50–1.10)
GFR calc Af Amer: >90 mL/min (ref >90)
GFR calc non Af Amer: >90 mL/min (ref >90)

## 2015-03-04 LAB — MAGNESIUM: Magnesium: 2.8 mg/dL — ABNORMAL HIGH (ref 1.5–2.5)

## 2015-03-04 LAB — PLATELET COUNT: Platelets: 176 10*3/uL (ref 150–400)

## 2015-03-04 LAB — HEMOGLOBIN AND HEMATOCRIT, BLOOD
HCT: 27.4 % — ABNORMAL LOW (ref 36.0–46.0)
Hemoglobin: 8.7 g/dL — ABNORMAL LOW (ref 12.0–15.0)

## 2015-03-04 LAB — APTT: aPTT: 37 seconds (ref 24–37)

## 2015-03-04 SURGERY — EXCISION, MYXOMA, CARDIAC ATRIUM, MINIMALLY INVASIVE
Anesthesia: General | Site: Chest

## 2015-03-04 MED ORDER — ACETAMINOPHEN 650 MG RE SUPP
650.0000 mg | Freq: Once | RECTAL | Status: AC
  Administered 2015-03-04: 650 mg via RECTAL

## 2015-03-04 MED ORDER — LIDOCAINE HCL (CARDIAC) 20 MG/ML IV SOLN
INTRAVENOUS | Status: DC | PRN
  Administered 2015-03-04: 100 mg via INTRAVENOUS

## 2015-03-04 MED ORDER — TRAMADOL HCL 50 MG PO TABS
50.0000 mg | ORAL_TABLET | ORAL | Status: DC | PRN
  Administered 2015-03-07 – 2015-03-08 (×2): 100 mg via ORAL
  Filled 2015-03-04 (×2): qty 2

## 2015-03-04 MED ORDER — FENTANYL CITRATE 0.05 MG/ML IJ SOLN
INTRAMUSCULAR | Status: AC
  Filled 2015-03-04: qty 5

## 2015-03-04 MED ORDER — PHENYLEPHRINE HCL 10 MG/ML IJ SOLN
0.0000 ug/min | INTRAVENOUS | Status: DC
  Filled 2015-03-04: qty 2

## 2015-03-04 MED ORDER — ACETAMINOPHEN 160 MG/5ML PO SOLN: 1000.0000 mg | Freq: Four times a day (QID) | ORAL | Status: DC

## 2015-03-04 MED ORDER — MIDAZOLAM HCL 5 MG/5ML IJ SOLN
INTRAMUSCULAR | Status: DC | PRN
  Administered 2015-03-04 (×2): 1 mg via INTRAVENOUS
  Administered 2015-03-04: 3 mg via INTRAVENOUS
  Administered 2015-03-04: 1 mg via INTRAVENOUS
  Administered 2015-03-04 (×3): 2 mg via INTRAVENOUS

## 2015-03-04 MED ORDER — PROTAMINE SULFATE 10 MG/ML IV SOLN
INTRAVENOUS | Status: DC | PRN
  Administered 2015-03-04: 10 mg via INTRAVENOUS
  Administered 2015-03-04: 270 mg via INTRAVENOUS

## 2015-03-04 MED ORDER — LACTATED RINGERS IV SOLN
INTRAVENOUS | Status: DC
  Administered 2015-03-04: 15:00:00 via INTRAVENOUS

## 2015-03-04 MED ORDER — FENTANYL CITRATE 0.05 MG/ML IJ SOLN
INTRAMUSCULAR | Status: DC | PRN
  Administered 2015-03-04: 50 ug via INTRAVENOUS
  Administered 2015-03-04: 150 ug via INTRAVENOUS
  Administered 2015-03-04: 200 ug via INTRAVENOUS
  Administered 2015-03-04: 50 ug via INTRAVENOUS
  Administered 2015-03-04 (×2): 250 ug via INTRAVENOUS
  Administered 2015-03-04: 50 ug via INTRAVENOUS
  Administered 2015-03-04: 150 ug via INTRAVENOUS
  Administered 2015-03-04: 250 ug via INTRAVENOUS
  Administered 2015-03-04: 100 ug via INTRAVENOUS
  Administered 2015-03-04: 150 ug via INTRAVENOUS
  Administered 2015-03-04: 250 ug via INTRAVENOUS
  Administered 2015-03-04: 100 ug via INTRAVENOUS

## 2015-03-04 MED ORDER — HEPARIN SODIUM (PORCINE) 1000 UNIT/ML IJ SOLN
INTRAMUSCULAR | Status: DC | PRN
  Administered 2015-03-04: 5 mL via INTRAVENOUS
  Administered 2015-03-04: 25 mL via INTRAVENOUS

## 2015-03-04 MED ORDER — SODIUM CHLORIDE 0.9 % IJ SOLN
3.0000 mL | Freq: Two times a day (BID) | INTRAMUSCULAR | Status: DC
Start: 2015-03-05 — End: 2015-03-09
  Administered 2015-03-05: 10 mL via INTRAVENOUS
  Administered 2015-03-08: 3 mL via INTRAVENOUS

## 2015-03-04 MED ORDER — SODIUM CHLORIDE 0.45 % IV SOLN: INTRAVENOUS | Status: DC | PRN

## 2015-03-04 MED ORDER — ASPIRIN EC 325 MG PO TBEC
325.0000 mg | DELAYED_RELEASE_TABLET | Freq: Every day | ORAL | Status: DC
  Administered 2015-03-05 – 2015-03-09 (×5): 325 mg via ORAL
  Filled 2015-03-04 (×5): qty 1

## 2015-03-04 MED ORDER — MORPHINE SULFATE 2 MG/ML IJ SOLN
2.0000 mg | INTRAMUSCULAR | Status: DC | PRN
  Administered 2015-03-04: 2 mg via INTRAVENOUS
  Administered 2015-03-04: 4 mg via INTRAVENOUS
  Administered 2015-03-05: 2 mg via INTRAVENOUS
  Administered 2015-03-05: 4 mg via INTRAVENOUS
  Administered 2015-03-05: 2 mg via INTRAVENOUS
  Filled 2015-03-04 (×4): qty 1
  Filled 2015-03-04 (×2): qty 2
  Filled 2015-03-04: qty 1

## 2015-03-04 MED ORDER — SODIUM CHLORIDE 0.9 % IV SOLN
INTRAVENOUS | Status: DC
  Administered 2015-03-04: 1.7 [IU]/h via INTRAVENOUS
  Filled 2015-03-04 (×2): qty 2.5

## 2015-03-04 MED ORDER — SODIUM CHLORIDE 0.9 % IV SOLN
INTRAVENOUS | Status: DC
  Administered 2015-03-04: 16:00:00 via INTRAVENOUS

## 2015-03-04 MED ORDER — PANTOPRAZOLE SODIUM 40 MG PO TBEC
40.0000 mg | DELAYED_RELEASE_TABLET | Freq: Every day | ORAL | Status: DC
  Administered 2015-03-06 – 2015-03-09 (×4): 40 mg via ORAL
  Filled 2015-03-04 (×4): qty 1

## 2015-03-04 MED ORDER — DEXMEDETOMIDINE HCL IN NACL 200 MCG/50ML IV SOLN
0.0000 ug/kg/h | INTRAVENOUS | Status: DC
  Administered 2015-03-04: 0.5 ug/kg/h via INTRAVENOUS
  Filled 2015-03-04: qty 50

## 2015-03-04 MED ORDER — MIDAZOLAM HCL 2 MG/2ML IJ SOLN
INTRAMUSCULAR | Status: AC
  Filled 2015-03-04: qty 2

## 2015-03-04 MED ORDER — SODIUM CHLORIDE 0.9 % IV SOLN
250.0000 [IU] | INTRAVENOUS | Status: DC | PRN
  Administered 2015-03-04: 2 [IU]/h via INTRAVENOUS

## 2015-03-04 MED ORDER — FENTANYL CITRATE 0.05 MG/ML IJ SOLN
INTRAMUSCULAR | Status: AC
Start: 2015-03-04 — End: 2015-03-04
  Filled 2015-03-04: qty 5

## 2015-03-04 MED ORDER — 0.9 % SODIUM CHLORIDE (POUR BTL) OPTIME
TOPICAL | Status: DC | PRN
  Administered 2015-03-04: 1000 mL

## 2015-03-04 MED ORDER — LACTATED RINGERS IV SOLN
INTRAVENOUS | Status: DC | PRN
  Administered 2015-03-04 (×2): via INTRAVENOUS

## 2015-03-04 MED ORDER — POTASSIUM CHLORIDE 10 MEQ/50ML IV SOLN
10.0000 meq | INTRAVENOUS | Status: AC
Start: 2015-03-04 — End: 2015-03-04
  Administered 2015-03-04 (×3): 10 meq via INTRAVENOUS

## 2015-03-04 MED ORDER — ACETAMINOPHEN 500 MG PO TABS
1000.0000 mg | ORAL_TABLET | Freq: Four times a day (QID) | ORAL | Status: DC
  Administered 2015-03-05 – 2015-03-09 (×12): 1000 mg via ORAL
  Filled 2015-03-04 (×20): qty 2

## 2015-03-04 MED ORDER — SODIUM CHLORIDE 0.9 % IV SOLN
INTRAVENOUS | Status: DC
  Administered 2015-03-04: 16:00:00 via INTRAVENOUS
  Administered 2015-03-05: 1 mL via INTRAVENOUS

## 2015-03-04 MED ORDER — ASPIRIN 81 MG PO CHEW: 324.0000 mg | CHEWABLE_TABLET | Freq: Every day | ORAL | Status: DC

## 2015-03-04 MED ORDER — MORPHINE SULFATE 2 MG/ML IJ SOLN
1.0000 mg | INTRAMUSCULAR | Status: DC | PRN
  Administered 2015-03-04 (×3): 2 mg via INTRAVENOUS
  Filled 2015-03-04: qty 2

## 2015-03-04 MED ORDER — GLYCOPYRROLATE 0.2 MG/ML IJ SOLN
INTRAMUSCULAR | Status: DC | PRN
  Administered 2015-03-04: 0.4 mg via INTRAVENOUS

## 2015-03-04 MED ORDER — METOPROLOL TARTRATE 25 MG/10 ML ORAL SUSPENSION
12.5000 mg | Freq: Two times a day (BID) | ORAL | Status: DC
  Filled 2015-03-04 (×3): qty 5

## 2015-03-04 MED ORDER — ONDANSETRON HCL 4 MG/2ML IJ SOLN
4.0000 mg | Freq: Four times a day (QID) | INTRAMUSCULAR | Status: DC | PRN
  Administered 2015-03-05 – 2015-03-07 (×3): 4 mg via INTRAVENOUS
  Filled 2015-03-04 (×3): qty 2

## 2015-03-04 MED ORDER — PROPOFOL 10 MG/ML IV BOLUS
INTRAVENOUS | Status: DC | PRN
  Administered 2015-03-04: 200 mg via INTRAVENOUS

## 2015-03-04 MED ORDER — SODIUM CHLORIDE 0.9 % IV SOLN
10.0000 g | INTRAVENOUS | Status: DC | PRN
  Administered 2015-03-04: 5 g/h via INTRAVENOUS

## 2015-03-04 MED ORDER — INSULIN REGULAR BOLUS VIA INFUSION
0.0000 [IU] | Freq: Three times a day (TID) | INTRAVENOUS | Status: DC
  Filled 2015-03-04: qty 10

## 2015-03-04 MED ORDER — LACTATED RINGERS IV SOLN
INTRAVENOUS | Status: DC | PRN
  Administered 2015-03-04: 07:00:00 via INTRAVENOUS

## 2015-03-04 MED ORDER — METOPROLOL TARTRATE 1 MG/ML IV SOLN: 2.5000 mg | INTRAVENOUS | Status: DC | PRN

## 2015-03-04 MED ORDER — BISACODYL 5 MG PO TBEC
10.0000 mg | DELAYED_RELEASE_TABLET | Freq: Every day | ORAL | Status: DC
Start: 2015-03-05 — End: 2015-03-09
  Administered 2015-03-05 – 2015-03-09 (×3): 10 mg via ORAL
  Filled 2015-03-04 (×3): qty 2

## 2015-03-04 MED ORDER — VANCOMYCIN HCL IN DEXTROSE 1-5 GM/200ML-% IV SOLN
1000.0000 mg | Freq: Once | INTRAVENOUS | Status: AC
  Administered 2015-03-04: 1000 mg via INTRAVENOUS
  Filled 2015-03-04: qty 200

## 2015-03-04 MED ORDER — DEXTROSE 5 % IV SOLN
1.5000 g | Freq: Two times a day (BID) | INTRAVENOUS | Status: AC
  Administered 2015-03-04 – 2015-03-06 (×4): 1.5 g via INTRAVENOUS
  Filled 2015-03-04 (×5): qty 1.5

## 2015-03-04 MED ORDER — LACTATED RINGERS IV SOLN: 500.0000 mL | Freq: Once | INTRAVENOUS | Status: AC | PRN

## 2015-03-04 MED ORDER — SODIUM CHLORIDE 0.9 % IV SOLN
200.0000 ug | INTRAVENOUS | Status: DC | PRN
  Administered 2015-03-04: .3 ug/kg/h via INTRAVENOUS

## 2015-03-04 MED ORDER — METOPROLOL TARTRATE 12.5 MG HALF TABLET
12.5000 mg | ORAL_TABLET | Freq: Two times a day (BID) | ORAL | Status: DC
  Filled 2015-03-04 (×3): qty 1

## 2015-03-04 MED ORDER — SODIUM CHLORIDE 0.9 % IJ SOLN
3.0000 mL | INTRAMUSCULAR | Status: DC | PRN
  Administered 2015-03-08: 3 mL via INTRAVENOUS
  Filled 2015-03-04: qty 3

## 2015-03-04 MED ORDER — FAMOTIDINE IN NACL 20-0.9 MG/50ML-% IV SOLN
20.0000 mg | Freq: Two times a day (BID) | INTRAVENOUS | Status: DC
  Administered 2015-03-04: 20 mg via INTRAVENOUS

## 2015-03-04 MED ORDER — ALBUMIN HUMAN 5 % IV SOLN
250.0000 mL | INTRAVENOUS | Status: AC | PRN
  Administered 2015-03-04 – 2015-03-05 (×2): 250 mL via INTRAVENOUS

## 2015-03-04 MED ORDER — DOCUSATE SODIUM 100 MG PO CAPS
200.0000 mg | ORAL_CAPSULE | Freq: Every day | ORAL | Status: DC
Start: 2015-03-05 — End: 2015-03-09
  Administered 2015-03-05 – 2015-03-09 (×5): 200 mg via ORAL
  Filled 2015-03-04 (×5): qty 2

## 2015-03-04 MED ORDER — BISACODYL 10 MG RE SUPP: 10.0000 mg | Freq: Every day | RECTAL | Status: DC

## 2015-03-04 MED ORDER — LACTATED RINGERS IV SOLN
INTRAVENOUS | Status: DC | PRN
  Administered 2015-03-04 (×2): via INTRAVENOUS

## 2015-03-04 MED ORDER — MAGNESIUM SULFATE 4 GM/100ML IV SOLN
4.0000 g | Freq: Once | INTRAVENOUS | Status: AC
  Administered 2015-03-04: 4 g via INTRAVENOUS
  Filled 2015-03-04: qty 100

## 2015-03-04 MED ORDER — PROPOFOL 10 MG/ML IV BOLUS
INTRAVENOUS | Status: AC
Start: 2015-03-04 — End: 2015-03-04
  Filled 2015-03-04: qty 20

## 2015-03-04 MED ORDER — SODIUM CHLORIDE 0.9 % IV SOLN
250.0000 mL | INTRAVENOUS | Status: DC
Start: 2015-03-05 — End: 2015-03-09

## 2015-03-04 MED ORDER — PROPOFOL 10 MG/ML IV BOLUS
INTRAVENOUS | Status: AC
  Filled 2015-03-04: qty 20

## 2015-03-04 MED ORDER — PHENYLEPHRINE HCL 10 MG/ML IJ SOLN
10.0000 mg | INTRAVENOUS | Status: DC | PRN
  Administered 2015-03-04: 15 ug/min via INTRAVENOUS

## 2015-03-04 MED ORDER — NITROGLYCERIN IN D5W 200-5 MCG/ML-% IV SOLN
INTRAVENOUS | Status: DC | PRN
  Administered 2015-03-04: 25 ug/min via INTRAVENOUS

## 2015-03-04 MED ORDER — NITROGLYCERIN IN D5W 200-5 MCG/ML-% IV SOLN
0.0000 ug/min | INTRAVENOUS | Status: DC
  Administered 2015-03-04: 60 ug/min via INTRAVENOUS

## 2015-03-04 MED ORDER — ROCURONIUM BROMIDE 100 MG/10ML IV SOLN
INTRAVENOUS | Status: DC | PRN
  Administered 2015-03-04: 50 mg via INTRAVENOUS

## 2015-03-04 MED ORDER — ACETAMINOPHEN 160 MG/5ML PO SOLN: 650.0000 mg | Freq: Once | ORAL | Status: AC

## 2015-03-04 MED ORDER — GLUTARALDEHYDE 0.625% SOAKING SOLUTION
TOPICAL | Status: DC | PRN
  Administered 2015-03-04: 1 via TOPICAL
  Filled 2015-03-04: qty 50

## 2015-03-04 MED ORDER — MIDAZOLAM HCL 2 MG/2ML IJ SOLN
2.0000 mg | INTRAMUSCULAR | Status: DC | PRN
  Administered 2015-03-04: 2 mg via INTRAVENOUS
  Filled 2015-03-04: qty 2

## 2015-03-04 MED ORDER — MIDAZOLAM HCL 10 MG/2ML IJ SOLN
INTRAMUSCULAR | Status: AC
  Filled 2015-03-04: qty 2

## 2015-03-04 MED ORDER — ALBUMIN HUMAN 5 % IV SOLN
INTRAVENOUS | Status: DC | PRN
  Administered 2015-03-04: 14:00:00 via INTRAVENOUS

## 2015-03-04 MED ORDER — OXYCODONE HCL 5 MG PO TABS
5.0000 mg | ORAL_TABLET | ORAL | Status: DC | PRN
  Administered 2015-03-05 – 2015-03-08 (×15): 10 mg via ORAL
  Administered 2015-03-08: 5 mg via ORAL
  Administered 2015-03-08 – 2015-03-09 (×4): 10 mg via ORAL
  Filled 2015-03-04: qty 2
  Filled 2015-03-04: qty 1
  Filled 2015-03-04 (×18): qty 2

## 2015-03-04 MED ORDER — VECURONIUM BROMIDE 10 MG IV SOLR
INTRAVENOUS | Status: DC | PRN
  Administered 2015-03-04 (×4): 5 mg via INTRAVENOUS

## 2015-03-04 MED FILL — Lidocaine HCl IV Inj 20 MG/ML: INTRAVENOUS | Qty: 5 | Status: AC

## 2015-03-04 MED FILL — Mannitol IV Soln 20%: INTRAVENOUS | Qty: 500 | Status: AC

## 2015-03-04 MED FILL — Sodium Bicarbonate IV Soln 8.4%: INTRAVENOUS | Qty: 50 | Status: AC

## 2015-03-04 MED FILL — Sodium Chloride IV Soln 0.9%: INTRAVENOUS | Qty: 2000 | Status: AC

## 2015-03-04 MED FILL — Heparin Sodium (Porcine) Inj 1000 Unit/ML: INTRAMUSCULAR | Qty: 40 | Status: AC

## 2015-03-04 MED FILL — Electrolyte-R (PH 7.4) Solution: INTRAVENOUS | Qty: 3000 | Status: AC

## 2015-03-04 SURGICAL SUPPLY — 117 items
ADAPTER CARDIO PERF ANTE/RETRO (ADAPTER) ×3 IMPLANT
BAG DECANTER FOR FLEXI CONT (MISCELLANEOUS) ×6 IMPLANT
BENZOIN TINCTURE PRP APPL 2/3 (GAUZE/BANDAGES/DRESSINGS) IMPLANT
BLADE SURG 11 STRL SS (BLADE) ×3 IMPLANT
CANISTER SUCTION 2500CC (MISCELLANEOUS) ×6 IMPLANT
CANNULA BIO-MED ART (CANNULA) ×3 IMPLANT
CANNULA FEM VENOUS REMOTE 22FR (CANNULA) ×3 IMPLANT
CANNULA FEMORAL ART 14 SM (MISCELLANEOUS) ×6 IMPLANT
CANNULA GUNDRY RCSP 15FR (MISCELLANEOUS) ×3 IMPLANT
CANNULA OPTISITE PERFUSION 16F (CANNULA) ×3 IMPLANT
CANNULA OPTISITE PERFUSION 18F (CANNULA) IMPLANT
CATH KIT ON Q 5IN SLV (PAIN MANAGEMENT) IMPLANT
CATH ROBINSON RED A/P 18FR (CATHETERS) ×3 IMPLANT
CELLS DAT CNTRL 66122 CELL SVR (MISCELLANEOUS) ×2 IMPLANT
CONN ST 1/4X3/8  BEN (MISCELLANEOUS) ×2
CONN ST 1/4X3/8 BEN (MISCELLANEOUS) ×4 IMPLANT
CONT SPEC STER OR (MISCELLANEOUS) ×3 IMPLANT
COVER MAYO STAND STRL (DRAPES) IMPLANT
COVER SURGICAL LIGHT HANDLE (MISCELLANEOUS) IMPLANT
CRADLE DONUT ADULT HEAD (MISCELLANEOUS) ×3 IMPLANT
DERMABOND ADVANCED (GAUZE/BANDAGES/DRESSINGS) ×2
DERMABOND ADVANCED .7 DNX12 (GAUZE/BANDAGES/DRESSINGS) ×4 IMPLANT
DEVICE PMI PUNCTURE CLOSURE (MISCELLANEOUS) ×3 IMPLANT
DEVICE TROCAR PUNCTURE CLOSURE (ENDOMECHANICALS) ×3 IMPLANT
DRAIN CHANNEL 28F RND 3/8 FF (WOUND CARE) ×6 IMPLANT
DRAPE BILATERAL SPLIT (DRAPES) ×3 IMPLANT
DRAPE C-ARM 42X72 X-RAY (DRAPES) ×3 IMPLANT
DRAPE CV SPLIT W-CLR ANES SCRN (DRAPES) ×3 IMPLANT
DRAPE INCISE IOBAN 66X45 STRL (DRAPES) ×6 IMPLANT
DRAPE PROXIMA HALF (DRAPES) ×3 IMPLANT
DRAPE SLUSH/WARMER DISC (DRAPES) ×3 IMPLANT
DRSG COVADERM 4X8 (GAUZE/BANDAGES/DRESSINGS) ×3 IMPLANT
ELECT BLADE 6.5 EXT (BLADE) ×3 IMPLANT
ELECT REM PT RETURN 9FT ADLT (ELECTROSURGICAL) ×6
ELECTRODE REM PT RTRN 9FT ADLT (ELECTROSURGICAL) ×4 IMPLANT
FEMORAL VENOUS CANN RAP (CANNULA) IMPLANT
GAUZE SPONGE 4X4 12PLY STRL (GAUZE/BANDAGES/DRESSINGS) ×3 IMPLANT
GLOVE BIO SURGEON STRL SZ 6.5 (GLOVE) ×15 IMPLANT
GLOVE INDICATOR 6.5 STRL GRN (GLOVE) ×9 IMPLANT
GLOVE ORTHO TXT STRL SZ7.5 (GLOVE) ×9 IMPLANT
GOWN STRL REUS W/ TWL LRG LVL3 (GOWN DISPOSABLE) ×12 IMPLANT
GOWN STRL REUS W/TWL LRG LVL3 (GOWN DISPOSABLE) ×6
GRAFT PTCH CORMATRIX 7X10 4PLY (Prosthesis & Implant Heart) ×3 IMPLANT
GUIDEWIRE ANG ZIPWIRE 038X150 (WIRE) ×3 IMPLANT
INSERT CONFORM CROSS CLAMP 66M (MISCELLANEOUS) IMPLANT
INSERT CONFORM CROSS CLAMP 86M (MISCELLANEOUS) IMPLANT
KIT BASIN OR (CUSTOM PROCEDURE TRAY) ×3 IMPLANT
KIT DILATOR VASC 18G NDL (KITS) ×3 IMPLANT
KIT DRAINAGE VACCUM ASSIST (KITS) ×3 IMPLANT
KIT ROOM TURNOVER OR (KITS) ×3 IMPLANT
KIT SUCTION CATH 14FR (SUCTIONS) ×6 IMPLANT
LEAD PACING MYOCARDI (MISCELLANEOUS) ×3 IMPLANT
LINE VENT (MISCELLANEOUS) ×3 IMPLANT
NEEDLE AORTIC ROOT 14G 7F (CATHETERS) ×3 IMPLANT
NS IRRIG 1000ML POUR BTL (IV SOLUTION) ×15 IMPLANT
PACK OPEN HEART (CUSTOM PROCEDURE TRAY) ×3 IMPLANT
PAD ARMBOARD 7.5X6 YLW CONV (MISCELLANEOUS) ×6 IMPLANT
PAD ELECT DEFIB RADIOL ZOLL (MISCELLANEOUS) ×3 IMPLANT
RETRACTOR TRL SOFT TISSUE LG (INSTRUMENTS) IMPLANT
RETRACTOR TRM SOFT TISSUE 7.5 (INSTRUMENTS) IMPLANT
RTRCTR WOUND ALEXIS 18CM MED (MISCELLANEOUS) ×3
SET CANNULATION TOURNIQUET (MISCELLANEOUS) ×3 IMPLANT
SET CARDIOPLEGIA MPS 5001102 (MISCELLANEOUS) ×3 IMPLANT
SET IRRIG TUBING LAPAROSCOPIC (IRRIGATION / IRRIGATOR) ×3 IMPLANT
SOLUTION ANTI FOG 6CC (MISCELLANEOUS) ×3 IMPLANT
SPONGE GAUZE 4X4 12PLY STER LF (GAUZE/BANDAGES/DRESSINGS) ×6 IMPLANT
SUCKER WEIGHTED FLEX (MISCELLANEOUS) ×6 IMPLANT
SUT BONE WAX W31G (SUTURE) ×3 IMPLANT
SUT E-PACK MINIMALLY INVASIVE (SUTURE) ×3 IMPLANT
SUT ETHIBOND (SUTURE) IMPLANT
SUT ETHIBOND 2 0 SH (SUTURE) IMPLANT
SUT ETHIBOND 2 0 V4 (SUTURE) IMPLANT
SUT ETHIBOND 2 0V4 GREEN (SUTURE) IMPLANT
SUT ETHIBOND 2-0 RB-1 WHT (SUTURE) IMPLANT
SUT ETHIBOND 4 0 TF (SUTURE) IMPLANT
SUT ETHIBOND 5 0 C 1 30 (SUTURE) IMPLANT
SUT ETHIBOND NAB MH 2-0 36IN (SUTURE) IMPLANT
SUT ETHIBOND X763 2 0 SH 1 (SUTURE) ×3 IMPLANT
SUT GORETEX 6.0 TH-9 30 IN (SUTURE) IMPLANT
SUT GORETEX CV 4 TH 22 36 (SUTURE) ×3 IMPLANT
SUT GORETEX CV-5THC-13 36IN (SUTURE) IMPLANT
SUT GORETEX CV4 TH-18 (SUTURE) ×6 IMPLANT
SUT GORETEX TH-18 36 INCH (SUTURE) IMPLANT
SUT MNCRL AB 3-0 PS2 18 (SUTURE) IMPLANT
SUT PROLENE 3 0 SH DA (SUTURE) ×6 IMPLANT
SUT PROLENE 3 0 SH1 36 (SUTURE) ×39 IMPLANT
SUT PROLENE 4 0 RB 1 (SUTURE) ×2
SUT PROLENE 4-0 RB1 .5 CRCL 36 (SUTURE) ×4 IMPLANT
SUT PROLENE 5 0 C 1 36 (SUTURE) ×3 IMPLANT
SUT PROLENE 6 0 C 1 30 (SUTURE) IMPLANT
SUT SILK  1 MH (SUTURE)
SUT SILK 1 MH (SUTURE) IMPLANT
SUT SILK 1 TIES 10X30 (SUTURE) IMPLANT
SUT SILK 2 0 SH CR/8 (SUTURE) IMPLANT
SUT SILK 2 0 TIES 10X30 (SUTURE) IMPLANT
SUT SILK 2 0SH CR/8 30 (SUTURE) IMPLANT
SUT SILK 3 0 (SUTURE)
SUT SILK 3 0 SH CR/8 (SUTURE) IMPLANT
SUT SILK 3 0SH CR/8 30 (SUTURE) IMPLANT
SUT SILK 3-0 18XBRD TIE 12 (SUTURE) IMPLANT
SUT TEM PAC WIRE 2 0 SH (SUTURE) IMPLANT
SUT VIC AB 2-0 CTX 36 (SUTURE) IMPLANT
SUT VIC AB 2-0 UR6 27 (SUTURE) IMPLANT
SUT VIC AB 3-0 SH 8-18 (SUTURE) ×3 IMPLANT
SUT VICRYL 2 TP 1 (SUTURE) IMPLANT
SYRINGE 10CC LL (SYRINGE) IMPLANT
SYSTEM SAHARA CHEST DRAIN ATS (WOUND CARE) ×3 IMPLANT
TAPE CLOTH SURG 4X10 WHT LF (GAUZE/BANDAGES/DRESSINGS) ×6 IMPLANT
TOWEL OR 17X24 6PK STRL BLUE (TOWEL DISPOSABLE) ×6 IMPLANT
TOWEL OR 17X26 10 PK STRL BLUE (TOWEL DISPOSABLE) ×6 IMPLANT
TRAY CATH LUMEN 1 20CM STRL (SET/KITS/TRAYS/PACK) ×3 IMPLANT
TROCAR XCEL BLADELESS 5X75MML (TROCAR) ×3 IMPLANT
TROCAR XCEL NON-BLD 11X100MML (ENDOMECHANICALS) ×6 IMPLANT
TUNNELER SHEATH ON-Q 11GX8 DSP (PAIN MANAGEMENT) IMPLANT
UNDERPAD 30X30 INCONTINENT (UNDERPADS AND DIAPERS) ×3 IMPLANT
WATER STERILE IRR 1000ML POUR (IV SOLUTION) ×6 IMPLANT
WIRE BENTSON .035X145CM (WIRE) ×3 IMPLANT

## 2015-03-04 NOTE — Procedures (Signed)
Extubation Procedure Note  Patient Details:   Name: Debra Diaz DOB: [birth year] MRN: [id number]   Airway Documentation:  Airway (Active)  Secured at (cm) 22 cm 03/04/2015  5:18 PM  Measured From Lips 03/04/2015  5:18 PM  Secured Location Right 03/04/2015  5:18 PM  Secured By Pink Tape 03/04/2015  5:18 PM  Site Condition Dry 03/04/2015  3:12 PM    Evaluation  O2 sats: stable throughout Complications: No apparent complications Patient did tolerate procedure well. Bilateral Breath Sounds:  (coarse) Suctioning: Airway Yes   Patient parameters for NIF -30 and VC 1.8L. Patient extubated to 4 L [place] and was able to perform IS 737ml.  Carrington Clamp A 03/04/2015, 8:50 PM

## 2015-03-04 NOTE — Anesthesia Preprocedure Evaluation (Addendum)
Anesthesia Evaluation  Patient identified by MRN, date of birth, ID band Patient awake    Reviewed: Allergy & Precautions, NPO status , Patient's Chart, lab work & pertinent test results  Airway Mallampati: III   Neck ROM: full    Dental   Pulmonary          Cardiovascular hypertension,  Left atrial mass.   Neuro/Psych  Headaches,    GI/Hepatic   Endo/Other  diabetes, Type 2Morbid obesity  Renal/GU      Musculoskeletal   Abdominal   Peds  Hematology   Anesthesia Other Findings   Reproductive/Obstetrics                            Anesthesia Physical Anesthesia Plan  ASA: II  Anesthesia Plan: General   Post-op Pain Management:    Induction: Intravenous  Airway Management Planned: Oral ETT  Additional Equipment: Arterial line, CVP, PA Cath, TEE and Ultrasound Guidance Line Placement  Intra-op Plan:   Post-operative Plan: Post-operative intubation/ventilation  Informed Consent: I have reviewed the patients History and Physical, chart, labs and discussed the procedure including the risks, benefits and alternatives for the proposed anesthesia with the patient or authorized representative who has indicated his/her understanding and acceptance.   Dental advisory given  Plan Discussed with: CRNA, Anesthesiologist and Surgeon  Anesthesia Plan Comments:        Anesthesia Quick Evaluation

## 2015-03-04 NOTE — OR Nursing (Signed)
2nd call to SICU [id number].

## 2015-03-04 NOTE — Brief Op Note (Addendum)
02/26/2015 - 03/04/2015  12:56 PM  PATIENT:  Debra Diaz  39 y.o. female  PRE-OPERATIVE DIAGNOSIS:  LEFT ATRIAL MASS  POST-OPERATIVE DIAGNOSIS:  LEFT ATRIAL MYXOMA  PROCEDURE:  TRANSESOPHAGEAL ECHOCARDIOGRAM (TEE), MINIMALLY INVASIVE RESECTION OF LEFT ATRIAL MYXOMA (using a bilayer patch closure)   SURGEON:    [name], MD  ASSISTANTS:  Nani Skillern, PA-C  ANESTHESIA:   Albertha Ghee, MD  CROSSCLAMP TIME:   112'  CARDIOPULMONARY BYPASS TIME: 184'  FINDINGS:  4 cm mass attached to the interatrial septum and medial surface of left atrium c/w benign atrial myxoma  Relatively large sized stalk requiring resection of 2 x 2 cm portion of left atrial wall and interatrial septum  Normal LV systolic function  COMPLICATIONS: None  BASELINE WEIGHT: 97 kg  PATIENT DISPOSITION:   TO SICU IN STABLE CONDITION  [name] 03/04/2015 2:35 PM

## 2015-03-04 NOTE — Progress Notes (Signed)
  Echocardiogram Echocardiogram Transesophageal has been performed.  Bobbye Charleston 03/04/2015, 8:42 AM

## 2015-03-04 NOTE — OR Nursing (Signed)
1st call to SICU 1357.

## 2015-03-04 NOTE — Anesthesia Procedure Notes (Signed)
Procedure Name: Intubation Date/Time: 03/04/2015 8:10 AM Performed by: Clearnce Sorrel Pre-anesthesia Checklist: Patient identified, Timeout performed, Emergency Drugs available, Suction available and Patient being monitored Patient Re-evaluated:Patient Re-evaluated prior to inductionOxygen Delivery Method: Circle system utilized Preoxygenation: Pre-oxygenation with 100% oxygen Intubation Type: IV induction Ventilation: Mask ventilation without difficulty and Oral airway inserted - appropriate to patient size Laryngoscope Size: Glidescope and 3 Grade View: Grade I Tube type: Oral Endobronchial tube: Double lumen EBT and Left and 37 Fr Number of attempts: 1 Placement Confirmation: ETT inserted through vocal cords under direct vision,  breath sounds checked- equal and bilateral and positive ETCO2 Secured at: 37 cm Tube secured with: Tape Dental Injury: Teeth and Oropharynx as per pre-operative assessment

## 2015-03-04 NOTE — Transfer of Care (Signed)
Immediate Anesthesia Transfer of Care Note  Patient: Debra Diaz  Procedure(s) Performed: Procedure(s): MINIMALLY INVASIVE RESECTION OF LEFT ATRIAL MYXOMA ( USING A BILAYER PATCH CLOSURE) (N/A) TRANSESOPHAGEAL ECHOCARDIOGRAM (TEE) (N/A)  Patient Location: SICU  Anesthesia Type:General  Level of Consciousness: Patient remains intubated per anesthesia plan  Airway & Oxygen Therapy: Patient remains intubated per anesthesia plan  Post-op Assessment: Report given to RN and Post -op Vital signs reviewed and stable  Post vital signs: Reviewed and stable  Last Vitals:  Filed Vitals:   03/04/15 1512  BP: 137/84  Pulse: 80  Temp:   Resp: 12    Complications: No apparent anesthesia complications

## 2015-03-04 NOTE — Op Note (Addendum)
CARDIOTHORACIC SURGERY OPERATIVE NOTE  Date of Procedure:  03/04/2015  Preoperative Diagnosis: Left Atrial Mass  Postoperative Diagnosis: Left Atrial Myxoma  Procedure:    Minimally-Invasive Resection of Left Atrial Myxoma  Autologous pericardial patch reconstruction of left atrial wall and interatrial septum  Cor-matrix patch reconstruction of right atrial wall   Surgeon: Valentina Gu. Roxy Manns, MD  Assistant: Nani Skillern, PA-C  Anesthesia: Albertha Ghee, MD  Operative Findings:  4 cm mass attached to the interatrial septum and medial surface of left atrium c/w benign atrial myxoma  Relatively large sized stalk requiring resection of 2 x 2 cm portion of left atrial wall and interatrial septum  Normal LV systolic function           BRIEF CLINICAL NOTE AND INDICATIONS FOR SURGERY  Patient is a 39 year old obese Hispanic female with history of hypertension and type 2 diabetes mellitus who was admitted to the hospital acutely on 02/26/2015 with symptoms of headache and substernal chest pressure, shortness of breath, and left arm numbness. Chest pain was initially fairly severe but gradually subsided. Baseline EKG revealed sinus rhythm without ischemic changes. Troponin levels were elevated. CT angiogram of the chest revealed normal appearance of the thoracic aorta with no sign of aortic dissection, but a small mass was noted in the left atrium suspicious for atrial myxoma. Subsequent transthoracic and transesophageal echocardiograms confirm the presence of a fairly large mass within the left atrium that appears to the adherent to the anterior atrial septum. CT scan of the head was notable for the absence of sign of stroke or other significant abnormality. Cardiothoracic surgical consultation was requested.  The patient has been seen in consultation and counseled at length regarding the indications, risks and potential benefits of surgery.  All questions have been answered,  and the patient provides full informed consent for the operation as described.    DETAILS OF THE OPERATIVE PROCEDURE  Preparation:  The patient is brought to the operating room on the above mentioned date and central monitoring was established by the anesthesia team including placement of Swan-Ganz catheter through the left internal jugular vein.  A radial arterial line is placed. The patient is placed in the supine position on the operating table.  Intravenous antibiotics are administered. General endotracheal anesthesia is induced uneventfully. The patient is initially intubated using a dual lumen endotracheal tube.  A Foley catheter is placed.  Baseline transesophageal echocardiogram was performed.  Findings were notable for a large mass within the left atrium attached to the interatrial septum and prolapsing through the mitral valve consistent with likely atrial myxoma.  There was normal LV systolic function.  A soft roll is placed behind the patient's left scapula and the neck gently extended and turned to the left.   The patient's right neck, chest, abdomen, both groins, and both lower extremities are prepared and draped in a sterile manner. A time out procedure is performed.  Surgical Approach:  A right miniature anterolateral thoracotomy incision is performed. The incision is placed just lateral to and superior to the right nipple. The pectoralis major muscle is retracted medially and completely preserved. The right pleural space is entered through the 3rd intercostal space. A soft tissue retractor is placed.  Two 11 mm ports are placed through separate stab incisions inferiorly. The right pleural space is insufflated continuously with carbon dioxide gas through the posterior port during the remainder of the operation.  A pledgeted sutures placed through the dome of the right hemidiaphragm and retracted inferiorly  to facilitate exposure.  A longitudinal incision is made in the pericardium 3  cm anterior to the phrenic nerve and silk traction sutures are placed on either side of the incision for exposure.   Extracorporeal Cardiopulmonary Bypass and Myocardial Protection:  A small incision is made in the right inguinal crease and the anterior surface of the right common femoral artery and right common femoral vein are identified.  The patient is placed in Trendelenburg position. The right internal jugular vein is cannulated with Seldinger technique and a guidewire advanced into the right atrium. The patient is heparinized systemically. Attempts to cannulate the right internal jugular vein were unsuccessful and aborted.  Pursestring sutures are placed on the anterior surface of the right common femoral vein and right common femoral artery. The right common femoral vein is cannulated with the Seldinger technique and a guidewire is advanced under transesophageal echocardiogram guidance through the right atrium. The femoral vein is cannulated with a long 22 French femoral venous cannula. The right common femoral artery is cannulated with Seldinger technique and a flexible guidewire is advanced until it can be appreciated intraluminally in the descending thoracic aorta on transesophageal echocardiogram. The femoral artery is cannulated with an 18 French femoral arterial cannula.  Adequate heparinization is verified.     The entire pre-bypass portion of the operation was notable for stable hemodynamics.  Cardiopulmonary bypass was begun.  Vacuum assist venous drainage is utilized. The incision in the pericardium is extended in both directions. Venous drainage and exposure are notably excellent.  A large portion of the patient's pericardium was excised to be utilized for autologous pericardial patch reconstruction of intra-atrial septum. The pericardial patch was soaked in glutaraldehyde solution for 3 minutes and subsequently rinsed in consecutive baths of saline.  A 15 French pediatric femoral venous  cannula is inserted directly into the superior vena cava. Umbilical tapes placed around this. Vena cava and the inferior vena cava.  An antegrade cardioplegia cannula is placed in the ascending aorta.    The patient is cooled to 28C systemic temperature.  The aortic cross clamp is applied and cold blood cardioplegia is delivered initially in an antegrade fashion through the aortic root.  The initial cardioplegic arrest is rapid with early diastolic arrest.  Repeat doses of cardioplegia are administered intermittently every 20 to 30 minutes throughout the entire cross clamp portion of the operation through the aortic root in order to maintain completely flat electrocardiogram.  Myocardial protection was felt to be excellent.   Resection of Left Atrial Myxoma:  A small incision is made in the left atrium posteriorly through the intra-atrial groove. The anterior of the left atrium is examined and the large mass within the left atrium is easily appreciated. The incision of the left atrium and subsequently extended in both directions to facilitate exposure. The mass has gross characteristics consistent with left atrial myxoma and is attached to the intra-atrial septum with a broad stalk. The umbilical tapes are snared around the superior vena cava and inferior vena cava after pulling the femoral venous cannula down until the tip was just outside the right atrium. An oblique right atriotomy incision is performed. The fossa ovalis is exposed. Several traction sutures are placed in the right atrial wall to facilitate exposure. While carefully examining both sides of the fossa ovalis to avoid the stalk of the tumor, a small incision is made in the fossa ovalis. Stalk of the tumor and the base of the mass extends medially from the fossa ovalis  along the medial surface of the left atrium. The entire full-thickness left atrial wall is excised incorporating the entire base of the stalk of the myxoma. This requires a  fairly large resection of age or wall tissue extending medially towards the anterior mitral valve annulus. Once the entire base of the stalk is been excised the entire myxoma and stalk and portion of left atrial wall are removed and sent to pathology. Preliminary frozen section histology is notably consistent with benign myxoma.  Because of the relatively large size of the defect in the left atrial wall and intra-atrial septum, the right atriotomy incision is extended around the back wall of the right atrium to reach the defect in the intra-atrial septum and create one large incision across the back wall of both the left and right atrium. This results in excellent exposure of the medial aspect of the defect in the left atrial wall and intra-atrial septum to facilitate patch reconstruction. The patient's autologous pericardium is trimmed to an elliptical shape and subsequently used to patch close the left atrial wall and anterior atrial septum. The patch is sewn in place using running 3-0 Prolene suture. Because of the large size of the patch extending medially beyond the confines of the fossa ovalis, a second patch of Cor-matrix is utilized to close the right atrium medially.  This patch is also sewn in place using running 3-0 Prolene suture. After completion of the closure of the large defect in the medial left atrial wall and intra-atrial septum, the mitral valve is carefully inspected to make certain it remains intact. There is no sign of mitral regurgitation. A sump drain is placed across the mitral valve to serve as a left ventricular vent. The left atriotomy is closed posteriorly using a 2 layer closure of running 3-0 Prolene suture.   Procedure Completion:  One final dose of warm retrograde "hot shot" cardioplegia was administered antegrade through the aortic root.  The aortic cross clamp was removed after a total cross clamp time of 112 minutes.  The right atriotomy incision is closed using a 2 layer  closure of running 3-0 Prolene suture.  Epicardial pacing wires are fixed to the inferior wall of the right ventricule and to the right atrial appendage. The patient is rewarmed to 37C temperature. The left ventricular vent is removed.  The antegrade cardioplegia cannula is removed. The umbilical tapes are removed. The superior vena cava cannula is removed. The patient is weaned from cardiopulmonary bypass.  The patient's rhythm at separation from bypass was AV paced.  The patient was weaned from bypass without any inotropic support. Total cardiopulmonary bypass time for the operation was 184 minutes.  Followup transesophageal echocardiogram performed after separation from bypass revealed intact interatrial septum with no sign of any interatrial shunt.  There was normal mitral and aortic valve function.  Left ventricular function was unchanged from preoperatively.    The femoral arterial and venous cannulae were removed uneventfully. There was a palpable pulse in the distal right common femoral artery after removal of the cannula. Protamine was administered to reverse the anticoagulation.  Single lung ventilation was begun. The atriotomy closure was inspected for hemostasis. The pericardial sac was drained using a 28 French Bard drain placed through the anterior port incision.  The pericardium was closed using a patch of core matrix bovine submucosal tissue patch. The right pleural space is irrigated with saline solution and inspected for hemostasis. The right pleural space was drained using a 28 French Bard drain placed  through the posterior port incision. The miniature thoracotomy incision was closed in multiple layers in routine fashion. The right groin incision was inspected for hemostasis and closed in multiple layers in routine fashion.  The post-bypass portion of the operation was notable for stable rhythm and hemodynamics.  No blood products were administered during the  operation.   Disposition:  The patient tolerated the procedure well.  The patient was reintubated using a single lumen endotracheal tube and subsequently transported to the surgical intensive care unit in stable condition. There were no intraoperative complications. All sponge instrument and needle counts are verified correct at completion of the operation.     Valentina Gu. Roxy Manns MD 03/04/2015 2:41 PM

## 2015-03-04 NOTE — Clinical Social Work Psychosocial (Signed)
Clinical Social Work Department BRIEF PSYCHOSOCIAL ASSESSMENT 03/04/2015  Patient:  Debra Diaz, Debra Diaz     Account Number:  [id number]     Admit date:  02/26/2015  Clinical Social Worker:  Domenica Reamer, [place]  Date/Time:  03/02/2015 03:50 PM  Referred by:  Physician  Date Referred:  03/02/2015 Referred for  Other - See comment   Other Referral:   Letter to [place] [employer] in [place] to allow pt mother into the country   Interview type:  Patient Other interview type:    PSYCHOSOCIAL DATA Living Status:  FAMILY Admitted from facility:   Level of care:   Primary support name:  Debra Diaz Primary support relationship to patient:  SPOUSE Degree of support available:   high    CURRENT CONCERNS Current Concerns  Other - See comment   Other Concerns:    SOCIAL WORK ASSESSMENT / PLAN CSW spoke with pt briefly concerning pt desire to have pt mom come to the [place] follow pt surgery.  Pt first language is Spanish but she spoke [place] very well and was able to express most of what she wanted.  Pt states that she would like her mom to come from [place] to help with child care while the pt recovers.  Pt gave CSW an email address for her brother who lives in [place] so that he can aid the pts mom in applying for Visa.   Assessment/plan status:  Psychosocial Support/Ongoing Assessment of Needs Other assessment/ plan:   letter   Information/referral to community resources:    PATIENT'S/FAMILY'S RESPONSE TO PLAN OF CARE: Patient is very hopeful that her mom can come from [place] to help take care of hte children while the pt recovers- pt would be nervous to go home without the help of her mother.       Domenica Reamer, [place] Social Worker [phone]

## 2015-03-04 NOTE — Clinical Social Work Note (Signed)
CSW sent another email to patients brother's email address- [email or web address] with instructions on how to apply for emergency visa.  CSW will provide hand off for unit social worker.  Domenica Reamer, [place] Social Worker [phone]

## 2015-03-04 NOTE — Progress Notes (Addendum)
      [address]       [place] [address]             [phone]      Filed Vitals:   03/04/15 1900  BP: 94/61  Pulse: 80  Temp: 99.1 F (37.3 C)  Resp: 15    S/P Mini Resection of Left Atrail Myxoma -Hemodynamically stable on NTG drip -Adequate U/O ranging 40 ml-125 ml/hr -Awake following commands -Chest tube output low -Weaning to extubate - Continue current care  patient examined and medical record reviewed,agree with above note. Tharon Aquas Trigt III 03/05/2015

## 2015-03-05 ENCOUNTER — Inpatient Hospital Stay (HOSPITAL_COMMUNITY): Payer: No Typology Code available for payment source

## 2015-03-05 LAB — CBC
HCT: 29.8 % — ABNORMAL LOW (ref 36.0–46.0)
HCT: 31.7 % — ABNORMAL LOW (ref 36.0–46.0)
Hemoglobin: 9.3 g/dL — ABNORMAL LOW (ref 12.0–15.0)
Hemoglobin: 9.8 g/dL — ABNORMAL LOW (ref 12.0–15.0)
MCH: 25.4 pg — ABNORMAL LOW (ref 26.0–34.0)
MCH: 25.5 pg — ABNORMAL LOW (ref 26.0–34.0)
MCHC: 31.2 g/dL (ref 30.0–36.0)
MCHC: 30.9 g/dL (ref 30.0–36.0)
MCV: 81.4 fL (ref 78.0–100.0)
MCV: 82.6 fL (ref 78.0–100.0)
Platelets: 171 10*3/uL (ref 150–400)
Platelets: 210 10*3/uL (ref 150–400)
RBC: 3.66 MIL/uL — ABNORMAL LOW (ref 3.87–5.11)
RBC: 3.84 MIL/uL — ABNORMAL LOW (ref 3.87–5.11)
RDW: 15.5 % (ref 11.5–15.5)
RDW: 16 % — ABNORMAL HIGH (ref 11.5–15.5)
WBC: 13 10*3/uL — ABNORMAL HIGH (ref 4.0–10.5)
WBC: 18 10*3/uL — ABNORMAL HIGH (ref 4.0–10.5)

## 2015-03-05 LAB — GLUCOSE, CAPILLARY
Glucose-Capillary: 100 mg/dL — ABNORMAL HIGH (ref 70–99)
Glucose-Capillary: 113 mg/dL — ABNORMAL HIGH (ref 70–99)
Glucose-Capillary: 113 mg/dL — ABNORMAL HIGH (ref 70–99)
Glucose-Capillary: 114 mg/dL — ABNORMAL HIGH (ref 70–99)
Glucose-Capillary: 117 mg/dL — ABNORMAL HIGH (ref 70–99)
Glucose-Capillary: 117 mg/dL — ABNORMAL HIGH (ref 70–99)
Glucose-Capillary: 118 mg/dL — ABNORMAL HIGH (ref 70–99)
Glucose-Capillary: 118 mg/dL — ABNORMAL HIGH (ref 70–99)
Glucose-Capillary: 119 mg/dL — ABNORMAL HIGH (ref 70–99)
Glucose-Capillary: 122 mg/dL — ABNORMAL HIGH (ref 70–99)
Glucose-Capillary: 124 mg/dL — ABNORMAL HIGH (ref 70–99)
Glucose-Capillary: 126 mg/dL — ABNORMAL HIGH (ref 70–99)
Glucose-Capillary: 128 mg/dL — ABNORMAL HIGH (ref 70–99)
Glucose-Capillary: 134 mg/dL — ABNORMAL HIGH (ref 70–99)
Glucose-Capillary: 153 mg/dL — ABNORMAL HIGH (ref 70–99)

## 2015-03-05 LAB — BASIC METABOLIC PANEL
Anion gap: 4 — ABNORMAL LOW (ref 5–15)
BUN: 9 mg/dL (ref 6–23)
CO2: 23 mmol/L (ref 19–32)
Calcium: 7.3 mg/dL — ABNORMAL LOW (ref 8.4–10.5)
Chloride: 109 mmol/L (ref 96–112)
Creatinine, Ser: 0.55 mg/dL (ref 0.50–1.10)
GFR calc Af Amer: >90 mL/min (ref >90)
GFR calc non Af Amer: >90 mL/min (ref >90)
Glucose, Bld: 121 mg/dL — ABNORMAL HIGH (ref 70–99)
Potassium: 4.1 mmol/L (ref 3.5–5.1)
Sodium: 136 mmol/L (ref 135–145)

## 2015-03-05 LAB — BLOOD GAS, ARTERIAL
Acid-base deficit: 2.8 mmol/L — ABNORMAL HIGH (ref 0.0–2.0)
Bicarbonate: 22.1 mEq/L (ref 20.0–24.0)
Drawn by: 252031
O2 Content: 2 L/min
O2 Saturation: 97.1 %
Patient temperature: 98.6
TCO2: 23.4 mmol/L (ref 0–100)
pCO2 arterial: 41.9 mmHg (ref 35.0–45.0)
pH, Arterial: 7.342 — ABNORMAL LOW (ref 7.350–7.450)
pO2, Arterial: 103 mmHg — ABNORMAL HIGH (ref 80.0–100.0)

## 2015-03-05 LAB — CREATININE, SERUM
Creatinine, Ser: 0.72 mg/dL (ref 0.50–1.10)
GFR calc Af Amer: >90 mL/min (ref >90)
GFR calc non Af Amer: >90 mL/min (ref >90)

## 2015-03-05 LAB — MAGNESIUM
Magnesium: 2.3 mg/dL (ref 1.5–2.5)
Magnesium: 2.2 mg/dL (ref 1.5–2.5)

## 2015-03-05 MED ORDER — SODIUM CHLORIDE 0.9 % IJ SOLN: 3.0000 mL | INTRAMUSCULAR | Status: DC | PRN

## 2015-03-05 MED ORDER — INSULIN ASPART 100 UNIT/ML ~~LOC~~ SOLN
0.0000 [IU] | SUBCUTANEOUS | Status: DC
  Administered 2015-03-05 – 2015-03-06 (×3): 2 [IU] via SUBCUTANEOUS

## 2015-03-05 MED ORDER — FUROSEMIDE 10 MG/ML IJ SOLN
20.0000 mg | Freq: Two times a day (BID) | INTRAMUSCULAR | Status: AC
  Administered 2015-03-05 (×2): 20 mg via INTRAVENOUS
  Filled 2015-03-05 (×2): qty 2

## 2015-03-05 MED ORDER — KETOROLAC TROMETHAMINE 15 MG/ML IJ SOLN
15.0000 mg | Freq: Four times a day (QID) | INTRAMUSCULAR | Status: AC
  Administered 2015-03-05 – 2015-03-06 (×5): 15 mg via INTRAVENOUS
  Filled 2015-03-05 (×5): qty 1

## 2015-03-05 MED ORDER — MOVING RIGHT ALONG BOOK
Freq: Once | Status: AC
  Administered 2015-03-05: 08:00:00
  Filled 2015-03-05: qty 1

## 2015-03-05 MED ORDER — ENOXAPARIN SODIUM 40 MG/0.4ML ~~LOC~~ SOLN
40.0000 mg | Freq: Every day | SUBCUTANEOUS | Status: DC
  Administered 2015-03-06 – 2015-03-08 (×3): 40 mg via SUBCUTANEOUS
  Filled 2015-03-05 (×4): qty 0.4

## 2015-03-05 MED ORDER — POTASSIUM CHLORIDE CRYS ER 20 MEQ PO TBCR
20.0000 meq | EXTENDED_RELEASE_TABLET | Freq: Every day | ORAL | Status: AC
  Administered 2015-03-06 – 2015-03-09 (×4): 20 meq via ORAL
  Filled 2015-03-05 (×5): qty 1

## 2015-03-05 MED ORDER — SODIUM CHLORIDE 0.9 % IV SOLN: 250.0000 mL | INTRAVENOUS | Status: DC | PRN

## 2015-03-05 MED ORDER — INSULIN DETEMIR 100 UNIT/ML ~~LOC~~ SOLN
20.0000 [IU] | Freq: Every day | SUBCUTANEOUS | Status: DC
  Administered 2015-03-05: 20 [IU] via SUBCUTANEOUS
  Filled 2015-03-05 (×3): qty 0.2

## 2015-03-05 MED ORDER — SODIUM CHLORIDE 0.9 % IJ SOLN
3.0000 mL | Freq: Two times a day (BID) | INTRAMUSCULAR | Status: DC
  Administered 2015-03-05: 10 mL via INTRAVENOUS
  Administered 2015-03-06 – 2015-03-08 (×2): 3 mL via INTRAVENOUS

## 2015-03-05 MED ORDER — FUROSEMIDE 40 MG PO TABS
40.0000 mg | ORAL_TABLET | Freq: Every day | ORAL | Status: AC
  Administered 2015-03-06 – 2015-03-09 (×4): 40 mg via ORAL
  Filled 2015-03-05 (×4): qty 1

## 2015-03-05 MED ORDER — MORPHINE SULFATE 2 MG/ML IJ SOLN
2.0000 mg | INTRAMUSCULAR | Status: DC | PRN
  Administered 2015-03-05 – 2015-03-08 (×4): 2 mg via INTRAVENOUS
  Filled 2015-03-05 (×3): qty 1

## 2015-03-05 MED FILL — Dexmedetomidine HCl in NaCl 0.9% IV Soln 400 MCG/100ML: INTRAVENOUS | Qty: 100 | Status: AC

## 2015-03-05 MED FILL — Potassium Chloride Inj 2 mEq/ML: INTRAVENOUS | Qty: 40 | Status: AC

## 2015-03-05 MED FILL — Heparin Sodium (Porcine) Inj 1000 Unit/ML: INTRAMUSCULAR | Qty: 30 | Status: AC

## 2015-03-05 MED FILL — Magnesium Sulfate Inj 50%: INTRAMUSCULAR | Qty: 10 | Status: AC

## 2015-03-05 NOTE — Progress Notes (Signed)
   Awake and extubated  VS are stable. Able to follow commands. No atrial arrhythmias of significance.  EKG reveals an atrial paced rhythm.  Overall doing well.

## 2015-03-05 NOTE — Anesthesia Postprocedure Evaluation (Signed)
  Anesthesia Post-op Note  Patient: Debra Diaz  Procedure(s) Performed: Procedure(s): MINIMALLY INVASIVE RESECTION OF LEFT ATRIAL MYXOMA ( USING A BILAYER PATCH CLOSURE) (N/A) TRANSESOPHAGEAL ECHOCARDIOGRAM (TEE) (N/A)  Patient Location: ICU  Anesthesia Type:General  Level of Consciousness: sedated  Airway and Oxygen Therapy: Patient remains intubated per anesthesia plan  Post-op Pain: none  Post-op Assessment: Post-op Vital signs reviewed, Patient's Cardiovascular Status Stable and Respiratory Function Stable  Post-op Vital Signs: Reviewed and stable  Last Vitals:  Filed Vitals:   03/05/15 0715  BP:   Pulse: 79  Temp: 37.1 C  Resp: 19    Complications: No apparent anesthesia complications

## 2015-03-05 NOTE — Progress Notes (Signed)
Pt received from 2S to room 2W22. She is alert and oriented. Husband with her. Assisted to BR, voided 50 ml. Assisted to chair. Call bell in reach. PO fluids provided. Patient complains of pain and nausea, medicated. Cardiac monitor in place, CCMD notified. Chest tubes X2 in place, no air leak, to 20 cm wall suction. Epicardial pacemaker in place and set to AAI at 17

## 2015-03-05 NOTE — Progress Notes (Signed)
CARDIAC REHAB PHASE I   PRE:  Rate/Rhythm: 70 pacing    BP: sitting 103/56    SaO2: 93 RA  MODE:  Ambulation: 300 ft   POST:  Rate/Rhythm: 70 pacing    BP: sitting 116/62     SaO2: 95 RA  Pt c/o right thigh pain, like a sharp pain. Sts it only hurts when she walks and she initially did not want to walk. Convinced her to walk and actually had less pain than earlier and able to walk 300 ft with RW. Feels good besides leg pain. Return to recliner. Encouraged more walking with staff. [id number]   Debra Diaz [place] CES, ACSM 03/05/2015 3:24 PM

## 2015-03-05 NOTE — Clinical Social Work Note (Signed)
CSW provided pt with copy of emails sent to her brother- she confirmed email address is correct but she was unaware of whether or not pt brother go these emails.  CSW provided pt with copy of the signed letter from the doctor that was sent to the [place] embassy in [place].  Gypsy informed pt to tell nurse if she needed further assistance.  CSW signing off.  Domenica Reamer, [place] Social Worker [phone]

## 2015-03-05 NOTE — Progress Notes (Signed)
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  Mild soreness in chest.  No SOB.  Feels well.  Objective: Vital signs: BP Readings from Last 1 Encounters:  03/04/15 90/74   Pulse Readings from Last 1 Encounters:  03/05/15 79   Resp Readings from Last 1 Encounters:  03/05/15 19   Temp Readings from Last 1 Encounters:  03/05/15 98.8 F (37.1 C)     Hemodynamics: PAP: (15-39)/(3-18) 26/14 mmHg CO:  [3.3 L/min-7.1 L/min] 7.1 L/min CI:  [1.7 L/min/m2-3.7 L/min/m2] 3.7 L/min/m2  Physical Exam:  Rhythm:   Junctional 40's - AAI pacing  Breath sounds: clear  Heart sounds:  RRR w/out murmur  Incisions:  Dressings dry, intact  Abdomen:  Soft, non-distended, non-tender  Extremities:  Warm, well-perfused  Chest tubes:  Low volume thin serosanguinous output, no air leak    Intake/Output from previous day: 03/17 0701 - 03/18 0700 In: 6661.9 [I.V.:5201.9; Blood:360; NG/GT:30; IV Piggyback:1070] Out: 4665 [Urine:3415; Blood:1735; Chest Tube:320] Intake/Output this shift:    Lab Results:  CBC: Recent Labs  03/04/15 2120 03/04/15 2130 03/05/15 0410  WBC 12.6*  --  13.0*  HGB 10.2* 10.9* 9.3*  HCT 31.9* 32.0* 29.8*  PLT 162  --  171    BMET:  Recent Labs  03/04/15 0515  03/04/15 2130 03/05/15 0410  NA 138  < > 142 136  K 3.7  < > 4.4 4.1  CL 101  < > 108 109  CO2 27  --   --  23  GLUCOSE 108*  < > 131* 121*  BUN 8  < > 6 9  CREATININE 0.66  < > 0.50 0.55  CALCIUM 9.1  --   --  7.3*  < > = values in this interval not displayed.   CBG (last 3)   Recent Labs  03/04/15 2302 03/05/15 0009 03/05/15 0117  GLUCAP 141* 126* 113*    ABG    Component Value Date/Time   PHART  7.342* 03/05/2015 0255   PCO2ART 41.9 03/05/2015 0255   PO2ART 103.0* 03/05/2015 0255   HCO3 22.1 03/05/2015 0255   TCO2 23.4 03/05/2015 0255   ACIDBASEDEF 2.8* 03/05/2015 0255   O2SAT 97.1 03/05/2015 0255    CXR: PORTABLE CHEST - 1 VIEW  COMPARISON: 03/04/2015  FINDINGS: The endotracheal tube has been removed. The nasogastric tube has been removed. Two right chest tubes are unchanged. Left jugular Swan-Ganz catheter is unchanged, extending into the right pulmonary artery mild vascular congestive changes persist without significant difference. No large effusion is evident. No pneumothorax is evident.  IMPRESSION: Support equipment appears satisfactorily positioned.  No interval change in mild vascular congestive changes. No pneumothorax.   Electronically Signed  By: Andreas Newport M.D.  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

## 2015-03-05 NOTE — Discharge Summary (Signed)
Physician Discharge Summary       [address]       [place] [address]             [phone]    Patient ID: Debra Diaz MRN: [id number] DOB/AGE: 01/08/1976 39 y.o.  Admit date: 02/26/2015 Discharge date: 03/09/2015  Admission Diagnoses: 1. Left atrial mass 2. History of  HTN (hypertension) 3. History of DM2 (diabetes mellitus, type 2) 4. History of morbid obesity  Discharge Diagnoses:  1. Left atrial myxoma 2. History of  HTN (hypertension) 3. History of DM2 (diabetes mellitus, type 2) 4. History of morbid obesity 5. ABL anemia  Procedure (s):  Cardiac catheterization done by Dr. Ellyn Hack on 03/02/2015: FINDINGS:  Hemodynamics:   Central Aortic Pressure / Mean: 109/73/91 mmHg  Left Ventricular Pressure / LVEDP: 110/2/9 mmHg  Left Ventriculography: Deferred   Coronary Anatomy:  Dominance: Right  Left Main: Very Large caliber vessel that trifurcates into the LAD, Ramus Intermedius, and Left Circumflex. Angiographically normal  LAD: Large-caliber vessel with a very proximal large caliber High First Diagonal Branch. The LAD has mild diffuse tender 20% lesions but is relatively atrophic normal as it courses down around the apex perfusing the distal third of the inferoapex.   D1: Large caliber high branch with several distal branches. Mild possible luminal irregularities of less than 30%.   D2: Moderate caliber vessel that arises from the mid LAD. It does not cover large distribution but is angiographically normal.  Left Circumflex: Large-caliber, nondominant vessel that courses mostly as a large lateral bifurcating OM branch. The inferior branch is much larger than the superior branch. The distal vessel is tortuous and reaches down almost of the inferoapex. Mild luminal irregularity. There is a very small AV groove branch..   Ramus intermedius: Large caliber vessel that courses is a high OM. It gives off a moderate sized branch from the  mid vessel just after a eccentric tubular 30% lesion.. The daughter vessel and parent both reaches almost to the apex. They are somewhat tortuous, but relatively free of disease.   RCA: Large-caliber codominant vessel that gives rise to a significant RV marginal branch in the mid vessel. It bifurcates distally into the Right Posterior Descending Artery (RPDA) and the Right Posterior AV Groove Branch (RPAV). Angiographically normal.   RPDA: Large caliber vessel that reaches two thirds the way to the apex. Angiographically normal.   RPL Sysytem:The RPAV begins as a moderate large vessel that terminates as a moderate caliber posterolateral branch. Angiographically normal    Minimally-Invasive Resection of Left Atrial Myxoma by Dr. Roxy Manns on 03/04/2015.  History of Presenting Illness: Patient is a 39 year old obese Hispanic female with history of hypertension and type 2 diabetes mellitus who was admitted to the hospital acutely on 02/26/2015 with symptoms of headache and substernal chest pressure, shortness of breath, and left arm numbness. Chest pain was initially fairly severe but gradually subsided. Baseline EKG revealed sinus rhythm without ischemic changes. Troponin levels were elevated. CT angiogram of the chest revealed normal appearance of the thoracic aorta with no sign of aortic dissection, but a small mass was noted in the left atrium suspicious for atrial myxoma. Subsequent transthoracic and transesophageal echocardiograms confirm the presence of a fairly large mass within the left atrium that appears to the adherent to the anterior atrial septum. CT scan of the head was notable for the absence of sign of stroke or other significant abnormality. Cardiothoracic surgical consultation was requested.  The patient is  originally from [place] but has lived locally in [place] for many years. She is married and has 5 children. Prior to her current hospitalization she reports no previous  history of exertional chest discomfort or shortness of breath. She admits that she has not very active physically. She denies any history of PND, orthopnea, or lower extremity edema. She has not had dizzy spells, palpitations, nor syncope.   Dr. Roxy Manns discussed the indications, risks and potential benefits of elective surgical resection of the patient's left atrial mass with the patient and her family yesterday evening. Following her heart catheterization this afternoon, Dr. Roxy Manns went through matters again at length with the patient using the assistance of an interpreter at the bedside. The patient understands and accepts all potential associated risks of surgery including but not limited to risk of death, stroke, myocardial infarction, congestive heart failure, respiratory failure, renal failure, bleeding requiring blood transfusion and/or reexploration, arrhythmia, heart block or bradycardia requiring permanent pacemaker, pneumonia, pleural effusion, wound infection, pulmonary embolus or other thromboembolic complication, chronic pain or other delayed complications. Alternative surgical approaches have been discussed including a comparison between conventional sternotomy and minimally-invasive techniques. The relative risks and benefits of each have been reviewed as they pertain to the patient's specific circumstances, and all of their questions have been addressed. Specific risks potentially related to the minimally-invasive approach were discussed at length, including but not limited to risk of conversion to full or partial sternotomy, aortic dissection or other major vascular complication, unilateral acute lung injury or pulmonary edema, phrenic nerve dysfunction or paralysis, rib fracture, chronic pain, lung hernia, or lymphocele. Pre operative carotid duplex US showed no significant carotid artery stenosis bilaterally.Patient agreed to proceed with surgery. She underwent a minimally invasive resection of  LA myxoma.  Brief Hospital Course:  The patient was extubated the evening of surgery without difficulty. She remained afebrile and hemodynamically stable. She initially was AAI paced and in junctional rhythm. Gordy Councilman, a line, and foley were removed early in the post operative course. Chest tubes remained for several days and then were removed on 03/21.She was volume over loaded and diuresed. She had ABL anemia. She did not require a post op transfusion. Her last H and H was 9.2 and 30.1. She was weaned off the insulin drip. The patient's glucose remained well controlled. The patient's HGA1C pre op was 6.3. She will need to obtain a medical doctor for further surveillance of HGA1C. The patient was felt surgically stable for transfer from the ICU to [facility] for further convalescence on 03/06/2015. She continues to progress with cardiac rehab. She was ambulating on room air. She has been tolerating a diet and has had a bowel movement. Epicardial pacing wires have already been removed. The patient is felt surgically stable for discharge today.  Latest Vital Signs: Blood pressure 106/61, pulse 77, temperature 98.4 F (36.9 C), temperature source Oral, resp. rate 20, height 5\' 1"  (1.549 m), weight 204 lb 9.4 oz (92.8 kg), last menstrual period 02/19/2015, SpO2 96 %.  Physical Exam: Rhythm: Junctional 40's - AAI pacing Breath sounds:clear Heart sounds:RRR w/out murmur Incisions:Dressings dry, intact Abdomen:Soft, non-distended, non-tender Extremities:Warm, well-perfused Chest tubes:Low volume thin serosanguinous output, no air leak  Discharge Condition:Stable and discharged to home  Recent laboratory studies:  Lab Results  Component Value Date   WBC 12.2* 03/08/2015   HGB 9.2* 03/08/2015   HCT 30.1* 03/08/2015    MCV 83.6 03/08/2015   PLT 251 03/08/2015   Lab Results  Component Value Date  NA 136 03/08/2015   K 3.7 03/08/2015   CL 98 03/08/2015   CO2 30 03/08/2015   CREATININE 0.51 03/08/2015   GLUCOSE 108* 03/08/2015      Diagnostic Studies:  EXAM: CHEST 2 VIEW  COMPARISON: 03/08/2015  FINDINGS: Cardiac shadow is stable but mildly enlarged. The right-sided chest tube is been removed in the interval. The previously seen right apical pneumothorax is no longer identified. Minimal blunting of the right costophrenic angle is again seen. No focal infiltrate or sizable effusion is noted. No acute bony abnormality is seen.  IMPRESSION: No significant pneumothorax following chest tube removal.   Electronically Signed  By: Inez Catalina M.D.  On: 03/09/2015 07:48  Ct Angio Head W/cm &/or Wo Cm  02/26/2015   CLINICAL DATA:  Headache.  Hypertension.  EXAM: CT ANGIOGRAPHY HEAD AND NECK  TECHNIQUE: Multidetector CT imaging of the head and neck was performed using the standard protocol during bolus administration of intravenous contrast. Multiplanar CT image reconstructions and MIPs were obtained to evaluate the vascular anatomy. Carotid stenosis measurements (when applicable) are obtained utilizing NASCET criteria, using the distal internal carotid diameter as the denominator.  CONTRAST:  143mL OMNIPAQUE IOHEXOL 350 MG/ML SOLN  COMPARISON:  None.  FINDINGS: CT HEAD  Brain: No acute infarct, hemorrhage, or mass lesion is present. The ventricles are of normal size. No significant extraaxial fluid collection is present.  Calvarium and skull base: Negative  Paranasal sinuses: Clear  Orbits: Within normal limits  CTA NECK  Aortic arch: A 3 vessel arch configuration is present. There is mild tortuosity of the innominate artery without significant stenosis.  Right carotid system: The right common carotid artery is within normal limits. The bifurcation is unremarkable. There is mild tortuosity of the  cervical right ICA without a significant stenosis.  Left carotid system: The left common carotid artery is within normal limits. Bifurcation is unremarkable. There is mild tortuosity of the cervical left ICA without a significant stenosis.  Vertebral arteries:The proximal vertebral arteries are poorly visualized due to beam hardening artifact. The both originate from the subclavian arteries. The left vertebral artery is slightly dominant to the right. There is no significant stenosis in the neck.  Skeleton: There is some straightening of the normal cervical lordosis without focal lytic or blastic lesions. Alignment is anatomic.  Other neck: No other focal soft tissue lesion is present. The salivary glands are within normal limits. Mucosal surfaces are normal. Thyroid is unremarkable. There is no significant adenopathy.  CTA HEAD  Anterior circulation: Cavernous internal carotid arteries are somewhat difficult to distinguish from the sinus given the late phase of imaging. The terminal ICA is normal bilaterally. The A1 and M1 segments are normal. The anterior communicating artery is patent. The right A1 segment is dominant. The MCA bifurcations are within normal limits. The ACA and MCA branch vessels are unremarkable.  Posterior circulation: The vertebral arteries are codominant. The left PICA origin is visualized and normal. The a right PICA is not seen. The basilar artery is within normal limits. Of post cerebral arteries originate from the basilar tip. The PCA branch vessels are within normal limits bilaterally.  Venous sinuses: The dural sinuses are patent. The left transverse sinus is dominant.  Anatomic variants: None  Delayed phase: Postcontrast images of the brain demonstrate no pathologic enhancement.  IMPRESSION: 1. Mild tortuosity of the cervical vasculature compatible with a history of hypertension. 2. No significant stenosis or occlusion. 3. No evidence for acute infarct.   Electronically Signed  By:  San Morelle M.D.   On: 02/26/2015 20:22   Ct Head Wo Contrast  02/26/2015   CLINICAL DATA:  Headache  EXAM: CT HEAD WITHOUT CONTRAST  TECHNIQUE: Contiguous axial images were obtained from the base of the skull through the vertex without intravenous contrast.  COMPARISON:  None.  FINDINGS: Ventricle size is normal. Negative for acute or chronic infarction. Negative for hemorrhage or fluid collection. Negative for mass or edema. No shift of the midline structures.  Calvarium is intact.  IMPRESSION: Normal   Electronically Signed   By: Franchot Gallo M.D.   On: 02/26/2015 15:44   Ct Angio Neck W/cm &/or Wo/cm  02/26/2015   CLINICAL DATA:  Headache.  Hypertension.  EXAM: CT ANGIOGRAPHY HEAD AND NECK  TECHNIQUE: Multidetector CT imaging of the head and neck was performed using the standard protocol during bolus administration of intravenous contrast. Multiplanar CT image reconstructions and MIPs were obtained to evaluate the vascular anatomy. Carotid stenosis measurements (when applicable) are obtained utilizing NASCET criteria, using the distal internal carotid diameter as the denominator.  CONTRAST:  183mL OMNIPAQUE IOHEXOL 350 MG/ML SOLN  COMPARISON:  None.  FINDINGS: CT HEAD  Brain: No acute infarct, hemorrhage, or mass lesion is present. The ventricles are of normal size. No significant extraaxial fluid collection is present.  Calvarium and skull base: Negative  Paranasal sinuses: Clear  Orbits: Within normal limits  CTA NECK  Aortic arch: A 3 vessel arch configuration is present. There is mild tortuosity of the innominate artery without significant stenosis.  Right carotid system: The right common carotid artery is within normal limits. The bifurcation is unremarkable. There is mild tortuosity of the cervical right ICA without a significant stenosis.  Left carotid system: The left common carotid artery is within normal limits. Bifurcation is unremarkable. There is mild tortuosity of the cervical left  ICA without a significant stenosis.  Vertebral arteries:The proximal vertebral arteries are poorly visualized due to beam hardening artifact. The both originate from the subclavian arteries. The left vertebral artery is slightly dominant to the right. There is no significant stenosis in the neck.  Skeleton: There is some straightening of the normal cervical lordosis without focal lytic or blastic lesions. Alignment is anatomic.  Other neck: No other focal soft tissue lesion is present. The salivary glands are within normal limits. Mucosal surfaces are normal. Thyroid is unremarkable. There is no significant adenopathy.  CTA HEAD  Anterior circulation: Cavernous internal carotid arteries are somewhat difficult to distinguish from the sinus given the late phase of imaging. The terminal ICA is normal bilaterally. The A1 and M1 segments are normal. The anterior communicating artery is patent. The right A1 segment is dominant. The MCA bifurcations are within normal limits. The ACA and MCA branch vessels are unremarkable.  Posterior circulation: The vertebral arteries are codominant. The left PICA origin is visualized and normal. The a right PICA is not seen. The basilar artery is within normal limits. Of post cerebral arteries originate from the basilar tip. The PCA branch vessels are within normal limits bilaterally.  Venous sinuses: The dural sinuses are patent. The left transverse sinus is dominant.  Anatomic variants: None  Delayed phase: Postcontrast images of the brain demonstrate no pathologic enhancement.  IMPRESSION: 1. Mild tortuosity of the cervical vasculature compatible with a history of hypertension. 2. No significant stenosis or occlusion. 3. No evidence for acute infarct.   Electronically Signed   By: San Morelle M.D.   On: 02/26/2015 20:22  Mr Jodene Nam Head Wo Contrast  03/02/2015   CLINICAL DATA:  Initial evaluation for headache. History of likely left atrial myxoma.  EXAM: MRI HEAD WITHOUT  CONTRAST  MRA HEAD WITHOUT CONTRAST  TECHNIQUE: Multiplanar, multiecho pulse sequences of the brain and surrounding structures were obtained without intravenous contrast. Angiographic images of the head were obtained using MRA technique without contrast.  COMPARISON:  Prior study from 02/26/2015  FINDINGS: MRI HEAD FINDINGS  The CSF containing spaces are within normal limits for patient age. No focal parenchymal signal abnormality is identified. No mass lesion, midline shift, or extra-axial fluid collection. Ventricles are normal in size without evidence of hydrocephalus.  No diffusion-weighted signal abnormality is identified to suggest acute intracranial infarct. Gray-white matter differentiation is maintained. Normal flow voids are seen within the intracranial vasculature. No intracranial hemorrhage identified.  The cervicomedullary junction is normal. Pituitary gland is within normal limits. Pituitary stalk is midline. The globes and optic nerves demonstrate a normal appearance with normal signal intensity. The  The bone marrow signal intensity is normal. Calvarium is intact. Visualized upper cervical spine is within normal limits.  Scalp soft tissues are unremarkable.  Minimal mucoperiosteal thickening present within the left maxillary sinus, left sphenoid sinus, and left-sided ethmoidal air cells. No mastoid effusion.  MRA HEAD FINDINGS  ANTERIOR CIRCULATION:  The visualized portions of the distal cervical internal carotid arteries are widely patent with antegrade flow. The petrous, cavernous, and supra clinoid segments are symmetric in caliber bilaterally. A1 segments, anterior communicating artery, and anterior cerebral arteries are widely patent. Middle cerebral arteries are widely patent with antegrade flow without high-grade flow-limiting stenosis or proximal branch occlusion. Distal MCA branches are symmetric bilaterally. No intracranial aneurysm within the anterior circulation.  POSTERIOR CIRCULATION:   The vertebral arteries are widely patent with antegrade flow. The posterior inferior cerebral arteries are normal. Vertebrobasilar junction and basilar artery are widely patent with antegrade flow without evidence of basilar tip stenosis or aneurysm. Posterior cerebral arteries are normal bilaterally. The superior cerebellar arteries and anterior inferior cerebellar arteries are widely patent bilaterally. No intracranial aneurysm within the posterior circulation.  IMPRESSION: MRI HEAD IMPRESSION:  Normal MRI of the brain with no acute intracranial infarct or other abnormality identified.  MRA HEAD IMPRESSION:  Normal MRA of the intracranial circulation.   Electronically Signed   By: Jeannine Boga M.D.   On: 03/02/2015 06:10   Mr Brain Wo Contrast  03/02/2015   CLINICAL DATA:  Initial evaluation for headache. History of likely left atrial myxoma.  EXAM: MRI HEAD WITHOUT CONTRAST  MRA HEAD WITHOUT CONTRAST  TECHNIQUE: Multiplanar, multiecho pulse sequences of the brain and surrounding structures were obtained without intravenous contrast. Angiographic images of the head were obtained using MRA technique without contrast.  COMPARISON:  Prior study from 02/26/2015  FINDINGS: MRI HEAD FINDINGS  The CSF containing spaces are within normal limits for patient age. No focal parenchymal signal abnormality is identified. No mass lesion, midline shift, or extra-axial fluid collection. Ventricles are normal in size without evidence of hydrocephalus.  No diffusion-weighted signal abnormality is identified to suggest acute intracranial infarct. Gray-white matter differentiation is maintained. Normal flow voids are seen within the intracranial vasculature. No intracranial hemorrhage identified.  The cervicomedullary junction is normal. Pituitary gland is within normal limits. Pituitary stalk is midline. The globes and optic nerves demonstrate a normal appearance with normal signal intensity. The  The bone marrow signal  intensity is normal. Calvarium is intact. Visualized upper cervical spine is within normal  limits.  Scalp soft tissues are unremarkable.  Minimal mucoperiosteal thickening present within the left maxillary sinus, left sphenoid sinus, and left-sided ethmoidal air cells. No mastoid effusion.  MRA HEAD FINDINGS  ANTERIOR CIRCULATION:  The visualized portions of the distal cervical internal carotid arteries are widely patent with antegrade flow. The petrous, cavernous, and supra clinoid segments are symmetric in caliber bilaterally. A1 segments, anterior communicating artery, and anterior cerebral arteries are widely patent. Middle cerebral arteries are widely patent with antegrade flow without high-grade flow-limiting stenosis or proximal branch occlusion. Distal MCA branches are symmetric bilaterally. No intracranial aneurysm within the anterior circulation.  POSTERIOR CIRCULATION:  The vertebral arteries are widely patent with antegrade flow. The posterior inferior cerebral arteries are normal. Vertebrobasilar junction and basilar artery are widely patent with antegrade flow without evidence of basilar tip stenosis or aneurysm. Posterior cerebral arteries are normal bilaterally. The superior cerebellar arteries and anterior inferior cerebellar arteries are widely patent bilaterally. No intracranial aneurysm within the posterior circulation.  IMPRESSION: MRI HEAD IMPRESSION:  Normal MRI of the brain with no acute intracranial infarct or other abnormality identified.  MRA HEAD IMPRESSION:  Normal MRA of the intracranial circulation.   Electronically Signed   By: Jeannine Boga M.D.   On: 03/02/2015 06:10   Ct Angio Chest Aorta W/cm &/or Wo/cm  02/26/2015   CLINICAL DATA:  39 year old female presenting with sudden onset of headache and chest heaviness, with some associated shortness of breath. History of hypertension and diabetes. Elevated blood pressure.  EXAM: CT ANGIOGRAPHY CHEST WITH CONTRAST  TECHNIQUE:  Multidetector CT imaging of the chest was performed using the standard protocol during bolus administration of intravenous contrast. Multiplanar CT image reconstructions and MIPs were obtained to evaluate the vascular anatomy.  CONTRAST:  12mL OMNIPAQUE IOHEXOL 350 MG/ML SOLN  COMPARISON:  No priors.  FINDINGS: Mediastinum/Lymph Nodes: No crescentic high attenuation associated with the wall of the thoracic aorta on precontrast images to indicate acute intramural hemorrhage. Large amount of pulsation artifact slightly limits the examination. With these limitations in mind, there is no definite evidence of thoracic aortic dissection. No thoracic aortic aneurysm. Ascending thoracic aorta, arch, and descending thoracic aorta measure 3.3 cm, 2.5 cm and 2.2 cm in diameter respectively. Heart size is normal. There is no significant pericardial fluid, thickening or pericardial calcification. Importantly, however, there is a 1.6 x 3.0 cm mass like lesion in the left atrium which appears intimately associated with the posterior aspect of the anterior leaflet of the mitral valve. On precontrast images this measures approximately 51 HU, as compared with the blood pool which is approximately 68-73 HU. Esophagus is normal in appearance. No pathologically enlarged mediastinal or hilar lymph nodes. No axillary lymphadenopathy.  Lungs/Pleura: No pneumothorax. No acute consolidative airspace disease. No pleural effusions. No suspicious appearing pulmonary nodules or masses.  Upper Abdomen: Unremarkable.  Musculoskeletal/Soft Tissues: There are no aggressive appearing lytic or blastic lesions noted in the visualized portions of the skeleton.  Review of the MIP images confirms the above findings.  IMPRESSION: 1. No evidence of aneurysm, dissection or other acute aortic syndrome of the thoracic aorta. 2. However, there is a 1.6 x 3.0 cm mass-like lesion in the left atrium which appears intimately associated with the posterior aspect of  the anterior leaflet of the mitral valve. 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 Other differential consideration would include a vegetation of the mitral valve, or a large papillary fibroelastoma (not  strongly favored). Correlation with echocardiography is strongly recommended. These results were called by telephone at the time of interpretation on 02/26/2015 at 8:23 pm to Dr. Charlesetta Shanks, who verbally acknowledged these results.   Electronically Signed   By: Vinnie Langton M.D.   On: 02/26/2015 20:26   Discharge Medications:   Medication List    STOP taking these medications        HYDROcodone-acetaminophen 5-325 MG per tablet  Commonly known as:  NORCO/VICODIN     lisinopril-hydrochlorothiazide 20-25 MG per tablet  Commonly known as:  PRINZIDE,ZESTORETIC      TAKE these medications        aspirin 325 MG EC tablet  Take 1 tablet (325 mg total) by mouth daily.     ferrous sulfate 325 (65 FE) MG tablet  Take 1 tablet (325 mg total) by mouth daily with breakfast. For one month then stop.     folic acid 1 MG tablet  Commonly known as:  FOLVITE  Take 1 tablet (1 mg total) by mouth daily. For one month then stop.     ibuprofen 200 MG tablet  Commonly known as:  ADVIL,MOTRIN  Take 800 mg by mouth once as needed for mild pain or moderate pain.     metFORMIN 500 MG tablet  Commonly known as:  GLUCOPHAGE  Take 500 mg by mouth 2 (two) times daily.     methocarbamol 500 MG tablet  Commonly known as:  ROBAXIN  Take 2 tablets (1,000 mg total) by mouth 4 (four) times daily as needed for muscle spasms (muscle spasm/pain).     multivitamin with minerals Tabs tablet  Take 1 tablet by mouth daily.  Start taking on:  04/12/2015     oxyCODONE 5 MG immediate release tablet  Commonly known as:  Oxy IR/ROXICODONE  Take 1-2 tablets (5-10 mg total) by mouth every 4 (four) hours as needed for severe pain.        The  patient has been discharged on:   1.Beta Blocker:  Yes [   ]                              No   [ x  ]                              If No, reason:SA node dysfunction, junctional rhythm  2.Ace Inhibitor/ARB: Yes [   ]                                     No  [ x   ]                                     If No, reason:Labile BP  3.Statin:   Yes [   ]                  No  [  x ]                  If No, reason:no CAD or hyperlipidemia  4.Shela Commons:  Yes  [ x  ]                  No   [   ]  If No, reason:  Follow Up Appointments: Follow-up Information    Follow up with [name], MD On 03/29/2015.   Specialty:  Cardiothoracic Surgery   Why:  PA/LAT CXR to be taken (at [facility] which is in the same building as Dr. Guy Sandifer office) on 03/29/2015 at 10:00 am;Appointment with Dr. Roxy Manns is at  11:00 am   Contact information:   [address] [place] [address] [phone]       Follow up with Medical Doctor.   Why:  Please obtain a medical doctor for further surveillance of HGA1C 6.3      Follow up with Rosaria Ferries, PA-C On 03/26/2015.   Specialty:  Cardiology   Why:  Appointment time is at 9:45 am   Contact information:   [place] [address] [phone]       Follow up with Nurse On 03/17/2015.   Why:  Appointment is with nurse only for chest tube suture removal only. Appointment time is at 10:00 am   Contact information:   [place]  [address] [phone]      Signed: Lars Pinks MPA-C 03/09/2015, 10:07 AM

## 2015-03-05 NOTE — Plan of Care (Signed)
Problem: Phase II - Intermediate Post-Op Goal: Wean to Extubate Outcome: Completed/Met Date Met:  03/05/15 Extubated within 6 hours to 4L [place] Goal: Maintain Hemodynamic Stability Outcome: Progressing On minimal pressor support Goal: CBGs/Blood Glucose per SCIP Criteria Outcome: Progressing On insulin gtt Goal: Advance Diet Outcome: Progressing Diet advanced to clear liquid Goal: Activity Progressed Outcome: Progressing Patient stood at bedside

## 2015-03-06 ENCOUNTER — Inpatient Hospital Stay (HOSPITAL_COMMUNITY): Payer: No Typology Code available for payment source

## 2015-03-06 LAB — BASIC METABOLIC PANEL
Anion gap: 6 (ref 5–15)
BUN: 9 mg/dL (ref 6–23)
CO2: 27 mmol/L (ref 19–32)
Calcium: 8 mg/dL — ABNORMAL LOW (ref 8.4–10.5)
Chloride: 103 mmol/L (ref 96–112)
Creatinine, Ser: 0.68 mg/dL (ref 0.50–1.10)
GFR calc Af Amer: >90 mL/min (ref >90)
GFR calc non Af Amer: >90 mL/min (ref >90)
Glucose, Bld: 129 mg/dL — ABNORMAL HIGH (ref 70–99)
Potassium: 3.9 mmol/L (ref 3.5–5.1)
Sodium: 136 mmol/L (ref 135–145)

## 2015-03-06 LAB — CBC
HCT: 28.7 % — ABNORMAL LOW (ref 36.0–46.0)
Hemoglobin: 8.8 g/dL — ABNORMAL LOW (ref 12.0–15.0)
MCH: 25.4 pg — ABNORMAL LOW (ref 26.0–34.0)
MCHC: 30.7 g/dL (ref 30.0–36.0)
MCV: 82.7 fL (ref 78.0–100.0)
Platelets: 181 10*3/uL (ref 150–400)
RBC: 3.47 MIL/uL — ABNORMAL LOW (ref 3.87–5.11)
RDW: 16.2 % — ABNORMAL HIGH (ref 11.5–15.5)
WBC: 13.6 10*3/uL — ABNORMAL HIGH (ref 4.0–10.5)

## 2015-03-06 LAB — GLUCOSE, CAPILLARY
Glucose-Capillary: 114 mg/dL — ABNORMAL HIGH (ref 70–99)
Glucose-Capillary: 129 mg/dL — ABNORMAL HIGH (ref 70–99)
Glucose-Capillary: 100 mg/dL — ABNORMAL HIGH (ref 70–99)
Glucose-Capillary: 121 mg/dL — ABNORMAL HIGH (ref 70–99)

## 2015-03-06 MED ORDER — FOLIC ACID 1 MG PO TABS
1.0000 mg | ORAL_TABLET | Freq: Every day | ORAL | Status: DC
  Administered 2015-03-06 – 2015-03-09 (×4): 1 mg via ORAL
  Filled 2015-03-06 (×4): qty 1

## 2015-03-06 MED ORDER — FERROUS SULFATE 325 (65 FE) MG PO TABS
325.0000 mg | ORAL_TABLET | Freq: Every day | ORAL | Status: DC
  Administered 2015-03-06 – 2015-03-09 (×4): 325 mg via ORAL
  Filled 2015-03-06 (×5): qty 1

## 2015-03-06 MED ORDER — POTASSIUM CHLORIDE CRYS ER 20 MEQ PO TBCR
20.0000 meq | EXTENDED_RELEASE_TABLET | Freq: Once | ORAL | Status: AC
  Administered 2015-03-06: 20 meq via ORAL
  Filled 2015-03-06: qty 1

## 2015-03-06 MED ORDER — INSULIN DETEMIR 100 UNIT/ML ~~LOC~~ SOLN
15.0000 [IU] | Freq: Every day | SUBCUTANEOUS | Status: DC
  Administered 2015-03-06: 15 [IU] via SUBCUTANEOUS
  Filled 2015-03-06 (×2): qty 0.15

## 2015-03-06 NOTE — Discharge Instructions (Signed)
Activity: 1.May walk up steps °               2.No lifting more than ten pounds for four weeks.  °               3.No driving for four weeks. °               4.Stop any activity that causes chest pain, shortness of breath, dizziness, sweating or excessive weakness. °               5.Avoid straining. °               6.Continue with your breathing exercises daily. ° °Diet: Diabetic diet and Low fat, Low salt diet ° °Wound Care: May shower.  Clean wounds with mild soap and water daily. Contact the office at [phone] if any problems arise. ° ° °

## 2015-03-06 NOTE — Progress Notes (Signed)
CARDIAC REHAB PHASE I   PRE:  Rate/Rhythm: 70 paced  BP:  Supine:   Sitting: 111/72  Standing:      MODE:  Ambulation: 350 ft   POST:  Rate/Rhythem: 76 sinus  BP:  Supine:   Sitting: 118/68  Standing:      Pt ambulated 350 ft with assist x1 using rolling walker.  Pt tolerated walk well, but did request pain meds upon return.  Pt visited bathroom while up and moving around.  She was returned to chair with son in room.  Pt encouraged to walk with staff and family over weekend.  We will f/u on Monday. Alberteen Sam, MA, ACSM RCEP [phone]  Clotilde Dieter

## 2015-03-06 NOTE — Progress Notes (Addendum)
      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  Objective: Vital signs in last 24 hours: Temp:  [98.3 F (36.8 C)-99 F (37.2 C)] 98.6 F (37 C) (03/19 0621) Pulse Rate:  [69-79] 71 (03/19 0621) Cardiac Rhythm:  [-] Atrial paced (03/18 2000) Resp:  [19-27] 21 (03/19 0621) BP: (103-137)/(53-70) 108/63 mmHg (03/19 0621) SpO2:  [91 %-100 %] 98 % (03/19 0621) Arterial Line BP: (100-119)/(50-60) 111/53 mmHg (03/18 1100) Weight:  [213 lb 3 oz (96.7 kg)] 213 lb 3 oz (96.7 kg) (03/19 0621)  Pre op weight 97 kg Current Weight  03/06/15 213 lb 3 oz (96.7 kg)    Hemodynamic parameters for last 24 hours: PAP: (26-27)/(12-14) 26/12 mmHg  Intake/Output from previous day: 03/18 0701 - 03/19 0700 In: 576.6 [P.O.:460; I.V.:116.6] Out: 2020 [Urine:1770; Chest Tube:250]   Physical Exam:  Cardiovascular: AAI paced Pulmonary: Diminished at bases bilaterally; no rales, wheezes, or rhonchi. Abdomen: Soft, non tender, bowel sounds present. Extremities: Mild bilateral lower extremity edema. Wounds: Clean and dry.  No erythema or signs of infection.  Lab Results: CBC: Recent Labs  03/05/15 1610 03/06/15 0337  WBC 18.0* 13.6*  HGB 9.8* 8.8*  HCT 31.7* 28.7*  PLT 210 181   BMET:  Recent Labs  03/05/15 0410 03/05/15 1610 03/06/15 0337  NA 136  --  136  K 4.1  --  3.9  CL 109  --  103  CO2 23  --  27  GLUCOSE 121*  --  129*  BUN 9  --  9  CREATININE 0.55 0.72 0.68  CALCIUM 7.3*  --  8.0*    PT/INR:  Lab Results  Component Value Date   INR 1.32 03/04/2015   INR 1.09 03/01/2015   INR 1.1 11/14/2007   ABG:  INR: Will add last result for INR, ABG once components are confirmed Will add last 4 CBG results  once components are confirmed  Assessment/Plan:  1. CV - AAI paced at 70. She has sinus node dysfunction, junctional rhythm. 2.  Pulmonary - Chest tubes with 325 cc of output last 12 hours.Chest tubes to remain for now. CXR appears to show low lung volumes, atelectasis at bases.Encourage incentive spirometer 3. Volume Overload - On Lasix 40 daily 4.  Acute blood loss anemia - H and H down to 8.8 and 28.7 this am. Will start oral ferrous. 5. DM-CBGs 114/134/153. Pre op HGA1C 6.3. On Insulin 20 bid but will decrease. Will restart Metformin once tolerating diet better (on liquids but will advance this am).Will need to establish with a medical doctor for further [facility] monitoring and diabetes management 6. Supplement potassium  ZIMMERMAN,DONIELLE MPA-C 03/06/2015,7:12 AM   Chart reviewed, patient examined, agree with above.

## 2015-03-06 NOTE — Progress Notes (Signed)
Nursing note Patient ambulated in hallway x1 331feet assist with walker. Patient tolerated well will monitor patient. Erryn Dickison, Bettina Gavia RN

## 2015-03-06 NOTE — Progress Notes (Signed)
Pt requesting something for nausea zofran given as ordered as needed for nausea, will monitor patient. Santiel Topper, Bettina Gavia RN

## 2015-03-07 LAB — GLUCOSE, CAPILLARY
Glucose-Capillary: 104 mg/dL — ABNORMAL HIGH (ref 70–99)
Glucose-Capillary: 107 mg/dL — ABNORMAL HIGH (ref 70–99)
Glucose-Capillary: 107 mg/dL — ABNORMAL HIGH (ref 70–99)

## 2015-03-07 MED ORDER — METFORMIN HCL 500 MG PO TABS
500.0000 mg | ORAL_TABLET | Freq: Two times a day (BID) | ORAL | Status: DC
  Administered 2015-03-07 – 2015-03-09 (×5): 500 mg via ORAL
  Filled 2015-03-07 (×7): qty 1

## 2015-03-07 MED ORDER — INSULIN ASPART 100 UNIT/ML ~~LOC~~ SOLN
0.0000 [IU] | Freq: Three times a day (TID) | SUBCUTANEOUS | Status: DC
  Administered 2015-03-08 (×2): 2 [IU] via SUBCUTANEOUS

## 2015-03-07 NOTE — Progress Notes (Signed)
Patient ambulated 150 ft unassisted with family present. Patient tolerated walking well.  RN will continue to monitor patient .

## 2015-03-07 NOTE — Progress Notes (Addendum)
      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  Objective: Vital signs in last 24 hours: Temp:  [98.3 F (36.8 C)-98.9 F (37.2 C)] 98.3 F (36.8 C) (03/20 4315) Pulse Rate:  [70-71] 71 (03/20 4008) Cardiac Rhythm:  [-] Atrial paced (03/19 2103) Resp:  [18-20] 18 (03/20 6761) BP: (94-118)/(52-70) 114/70 mmHg (03/20 0608) SpO2:  [93 %-96 %] 95 % (03/20 0608) Weight:  [213 lb 13.5 oz (97 kg)] 213 lb 13.5 oz (97 kg) (03/20 0608)  Pre op weight 97 kg Current Weight  03/07/15 213 lb 13.5 oz (97 kg)      Intake/Output from previous day: 03/19 0701 - 03/20 0700 In: 603 [P.O.:600; I.V.:3] Out: 83 [Urine:500; Chest Tube:110]   Physical Exam:  Cardiovascular: AAI paced Pulmonary: Diminished at bases bilaterally; no rales, wheezes, or rhonchi. Abdomen: Soft, non tender, bowel sounds present. Extremities: Trace bilateral lower extremity edema. Wounds: Clean and dry.  No erythema or signs of infection.  Lab Results: CBC:  Recent Labs  03/05/15 1610 03/06/15 0337  WBC 18.0* 13.6*  HGB 9.8* 8.8*  HCT 31.7* 28.7*  PLT 210 181   BMET:   Recent Labs  03/05/15 0410 03/05/15 1610 03/06/15 0337  NA 136  --  136  K 4.1  --  3.9  CL 109  --  103  CO2 23  --  27  GLUCOSE 121*  --  129*  BUN 9  --  9  CREATININE 0.55 0.72 0.68  CALCIUM 7.3*  --  8.0*    PT/INR:  Lab Results  Component Value Date   INR 1.32 03/04/2015   INR 1.09 03/01/2015   INR 1.1 11/14/2007   ABG:  INR: Will add last result for INR, ABG once components are confirmed Will add last 4 CBG results once components are confirmed  Assessment/Plan:  1. CV - AAI paced at 70. She has sinus node dysfunction, junctional rhythm. 2.  Pulmonary  - Chest tubes with 220 cc of output last 24 hours. Chest tubes to remain for now. CXR ordered for am.Encourage incentive spirometer 3. Volume Overload - On Lasix 40 daily 4.  Acute blood loss anemia - H and H yesterday 8.8 and 28.7 this am. Continue oral ferrous and folic acid. 5. DM-CBGs 129/100/121. Pre op HGA1C 6.3. On Insulin 15 bid. Will restart Metformin as tolerating diet. Will need to establish with a medical doctor for further [facility] monitoring and diabetes management   ZIMMERMAN,DONIELLE MPA-C 03/07/2015,8:05 AM    Chart reviewed, patient examined, agree with above. Rhythm is junctional in the 70's so will put on VVI backup at 50 and observe.

## 2015-03-07 NOTE — Progress Notes (Signed)
Pt having some difficulty today with pain management.  Pt reports little to no pain one hour following 10 mg oxycodone administration, however begins calling RN for additional pain medication after about two hours.  Pt was asked if she wished to try some IV pain medication if the pills were not effective, but she declined.  Pt was educated about the pain medication being every three hours, but stated she would like the same medication every two hours and insisted it had been every two hours before.  Pt remains alert and oriented and is resting comfortably in her chair.  Will continue to monitor.

## 2015-03-07 NOTE — Progress Notes (Signed)
Patient ambulated 350 ft with rolling walker and standby assistance. Patient tolerated walking well.  Patient returned to bed, call bell in place. Rn will continue to monitor patient

## 2015-03-08 ENCOUNTER — Inpatient Hospital Stay (HOSPITAL_COMMUNITY): Payer: No Typology Code available for payment source

## 2015-03-08 ENCOUNTER — Encounter (HOSPITAL_COMMUNITY): Payer: Self-pay | Admitting: Thoracic Surgery (Cardiothoracic Vascular Surgery)

## 2015-03-08 LAB — BASIC METABOLIC PANEL
Anion gap: 8 (ref 5–15)
BUN: 9 mg/dL (ref 6–23)
CO2: 30 mmol/L (ref 19–32)
Calcium: 8.6 mg/dL (ref 8.4–10.5)
Chloride: 98 mmol/L (ref 96–112)
Creatinine, Ser: 0.51 mg/dL (ref 0.50–1.10)
GFR calc Af Amer: >90 mL/min (ref >90)
GFR calc non Af Amer: >90 mL/min (ref >90)
Glucose, Bld: 108 mg/dL — ABNORMAL HIGH (ref 70–99)
Potassium: 3.7 mmol/L (ref 3.5–5.1)
Sodium: 136 mmol/L (ref 135–145)

## 2015-03-08 LAB — CBC
HCT: 30.1 % — ABNORMAL LOW (ref 36.0–46.0)
Hemoglobin: 9.2 g/dL — ABNORMAL LOW (ref 12.0–15.0)
MCH: 25.6 pg — ABNORMAL LOW (ref 26.0–34.0)
MCHC: 30.6 g/dL (ref 30.0–36.0)
MCV: 83.6 fL (ref 78.0–100.0)
Platelets: 251 10*3/uL (ref 150–400)
RBC: 3.6 MIL/uL — ABNORMAL LOW (ref 3.87–5.11)
RDW: 16.2 % — ABNORMAL HIGH (ref 11.5–15.5)
WBC: 12.2 10*3/uL — ABNORMAL HIGH (ref 4.0–10.5)

## 2015-03-08 LAB — GLUCOSE, CAPILLARY
Glucose-Capillary: 117 mg/dL — ABNORMAL HIGH (ref 70–99)
Glucose-Capillary: 120 mg/dL — ABNORMAL HIGH (ref 70–99)
Glucose-Capillary: 136 mg/dL — ABNORMAL HIGH (ref 70–99)
Glucose-Capillary: 98 mg/dL (ref 70–99)

## 2015-03-08 MED ORDER — POTASSIUM CHLORIDE CRYS ER 20 MEQ PO TBCR
40.0000 meq | EXTENDED_RELEASE_TABLET | Freq: Once | ORAL | Status: AC
  Administered 2015-03-08: 40 meq via ORAL
  Filled 2015-03-08: qty 2

## 2015-03-08 NOTE — Progress Notes (Signed)
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  Mild soreness.  Objective: Vital signs in last 24 hours: Temp:  [99.2 F (37.3 C)-99.5 F (37.5 C)] 99.2 F (37.3 C) (03/21 0409) Pulse Rate:  [64-70] 64 (03/21 0409) Cardiac Rhythm:  [-] Atrial paced (03/21 0753) Resp:  [17-18] 18 (03/21 0409) BP: (111-135)/(60-75) 111/60 mmHg (03/21 0409) SpO2:  [91 %-99 %] 91 % (03/21 0409) Weight:  [95.1 kg (209 lb 10.5 oz)] 95.1 kg (209 lb 10.5 oz) (03/21 0409)  Physical Exam:  Rhythm:   High junctional w/ stable HR  Breath sounds: clear  Heart sounds:  RRR  Incisions:  Clean and dry  Abdomen:  Soft, non-distended, non-tender  Extremities:  Warm, well-perfused  Chest tubes:  Low volume thin serosanguinous output, no air leak    Intake/Output from previous day: 03/20 0701 - 03/21 0700 In: 120 [P.O.:120] Out: 130 [Chest Tube:130] Intake/Output this shift: Total I/O In: 236 [P.O.:236] Out: -   Lab Results:  Recent Labs  03/06/15 0337 03/08/15 0528  WBC 13.6* 12.2*  HGB 8.8* 9.2*  HCT 28.7* 30.1*  PLT 181 251   BMET:  Recent Labs  03/06/15 0337 03/08/15 0528  NA 136 136  K 3.9 3.7  CL 103 98  CO2 27 30  GLUCOSE 129* 108*  BUN 9 9  CREATININE 0.68 0.51  CALCIUM 8.0* 8.6    CBG (last 3)   Recent Labs  03/07/15 1622 03/07/15 2132 03/08/15 0636  GLUCAP 107* 107* 117*   PT/INR:  No results for input(s): LABPROT, INR in the last 72 hours.  CXR:  PORTABLE CHEST - 1 VIEW  COMPARISON: March 06, 2015 chest radiograph and chest CT February 26, 2015  FINDINGS: Chest tube is present on the right. There is a small apical pneumothorax. A small amount of subcutaneous air is noted on  the right. There is mild bibasilar atelectasis. There is also mild atelectasis in the left upper lobe which is stable. Lungs are otherwise clear. There is no appreciable effusion currently. Heart size is within normal limits. Pulmonary vascularity is normal. There is mild soft tissue fullness in the right azygos region.  IMPRESSION: Minimal pneumothorax on the right with chest tube in place. No tension component. Small amount of subcutaneous air on right.  Mild bibasilar and left upper lobe atelectatic change. No new opacity. No change in cardiac silhouette. Prominence of the azygos vein is stable.   Electronically Signed  By: Lowella Grip III M.D.  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

## 2015-03-08 NOTE — Progress Notes (Signed)
Chest tubes DCd as ordered. Patient tolerated well gauze dressing applied will continue to monitor patient. Echo Allsbrook, Bettina Gavia RN

## 2015-03-08 NOTE — Progress Notes (Signed)
CARDIAC REHAB PHASE I   PRE:  Rate/Rhythm: 70 JR    BP: sitting 110/59    SaO2: 95 RA  MODE:  Ambulation: 500 ft   POST:  Rate/Rhythm: 86 SR    BP: sitting 128/72     SaO2: 94 RA  Tolerated well, no c/o. Slight wobbliness, pt able to correct herself. Anxious to go home tomorrow. Does not need RW but would like HHRN to check on her.  [phone]   Josephina Shih [place] CES, ACSM 03/08/2015 2:46 PM

## 2015-03-08 NOTE — Progress Notes (Signed)
Explained to the pt. That the pacer wires was ordered to be pulled. Educated the pt. Regarding the procedure. With pt in bed, tape was removed. External pacer was disconnected from pt. Sutures was then cut loose and removed from pt. Wires were then removed after pt asked to take a deep breath and hold. Wires removed with no complications. Pt tolerated well. Reminded pt that she is on bed rest for an hour. V/S taken per order. Cato Mulligan RN

## 2015-03-08 NOTE — Progress Notes (Signed)
Chart reviewed. Hospital d/c anticipated tomorrow. Note sent to [facility] schedulers to arrange outpatient f/u with Dr Debara Pickett or his NP/PA in 2 weeks. thx  Sherren Mocha 03/08/2015 10:54 AM

## 2015-03-09 ENCOUNTER — Inpatient Hospital Stay (HOSPITAL_COMMUNITY): Payer: No Typology Code available for payment source

## 2015-03-09 LAB — GLUCOSE, CAPILLARY: Glucose-Capillary: 101 mg/dL — ABNORMAL HIGH (ref 70–99)

## 2015-03-09 MED ORDER — OXYCODONE HCL 5 MG PO TABS: 5.0000 mg | ORAL_TABLET | ORAL | Status: DC | PRN

## 2015-03-09 MED ORDER — ADULT MULTIVITAMIN W/MINERALS CH: 1.0000 | ORAL_TABLET | Freq: Every day | ORAL | Status: DC

## 2015-03-09 MED ORDER — FOLIC ACID 1 MG PO TABS: 1.0000 mg | ORAL_TABLET | Freq: Every day | ORAL | Status: DC

## 2015-03-09 MED ORDER — FERROUS SULFATE 325 (65 FE) MG PO TABS: 325.0000 mg | ORAL_TABLET | Freq: Every day | ORAL | Status: DC

## 2015-03-09 MED ORDER — ASPIRIN 325 MG PO TBEC: 325.0000 mg | DELAYED_RELEASE_TABLET | Freq: Every day | ORAL | Status: DC

## 2015-03-09 NOTE — Progress Notes (Signed)
Discharge Note  Discontinued IV access Discontinued Telemetry monitor Pt informed about discharge instructions and discharge medications Pt discharged to home with volunteer service on wheelchair All pts belongings sent with pt.  Pedro Earls 03/09/2015 11:07 AM

## 2015-03-09 NOTE — Progress Notes (Signed)
Pt ambulated 500 ft with RN. Pt tolerated well; denied dizziness/SOB. Pt now resting in bed with call bell in reach. Will cont to monitor.

## 2015-03-09 NOTE — Progress Notes (Addendum)
      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  Objective: Vital signs in last 24 hours: Temp:  [98.1 F (36.7 C)-98.6 F (37 C)] 98.4 F (36.9 C) (03/22 0501) Pulse Rate:  [70-77] 77 (03/22 0501) Cardiac Rhythm:  [-] Junctional rhythm (03/21 2034) Resp:  [20-22] 20 (03/22 0501) BP: (106-133)/(60-78) 106/61 mmHg (03/22 0501) SpO2:  [94 %-96 %] 96 % (03/22 0501) Weight:  [204 lb 9.4 oz (92.8 kg)] 204 lb 9.4 oz (92.8 kg) (03/22 0501)  Pre op weight 97 kg Current Weight  03/09/15 204 lb 9.4 oz (92.8 kg)      Intake/Output from previous day: 03/21 0701 - 03/22 0700 In: 739 [P.O.:736; I.V.:3] Out: -    Physical Exam:  Cardiovascular: RRR Pulmonary: Mostly clear; no rales, wheezes, or rhonchi. Abdomen: Soft, non tender, bowel sounds present. Extremities: No lower extremity edema. Wounds: Clean and dry.  No erythema or signs of infection.  Lab Results: CBC:  Recent Labs  03/08/15 0528  WBC 12.2*  HGB 9.2*  HCT 30.1*  PLT 251   BMET:   Recent Labs  03/08/15 0528  NA 136  K 3.7  CL 98  CO2 30  GLUCOSE 108*  BUN 9  CREATININE 0.51  CALCIUM 8.6    PT/INR:  Lab Results  Component Value Date   INR 1.32 03/04/2015   INR 1.09 03/01/2015   INR 1.1 11/14/2007   ABG:  INR: Will add last result for INR, ABG once components are confirmed Will add last 4 CBG results once components are confirmed  Assessment/Plan:  1. CV -She has sinus node dysfunction, junctional rhythm. 2.  Pulmonary - Chest tubes removed yesterday. CXR appears stable. Encourage incentive spirometer 3. Volume Overload - On Lasix 40 daily. She is below pre op weight and no LE edema. Will not continue at  discharge. 4.  Acute blood loss anemia - H and H yesterday 9.2 and  this am. Continue oral ferrous and folic acid. 5. DM-CBGs 136/98/101. Pre op HGA1C 6.3. On Metformin 500 mg bid. Will need to establish with a medical doctor for further [facility] monitoring and diabetes management 6. Likely discharge home.  ZIMMERMAN,DONIELLE MPA-C 03/09/2015,7:38 AM    I have seen and examined the patient and agree with the assessment and plan as outlined.  D/C home today.  [name] 03/09/2015 9:58 AM

## 2015-03-09 NOTE — Care Management Note (Signed)
    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   (If response is "NO", the following Medicare IM given date fields will be blank) Date Medicare IM given:   Medicare IM given by:   Date Additional Medicare IM given:   Additional Medicare IM given by:    Discharge Disposition:  HOME/SELF CARE  Per UR Regulation:  Reviewed for med. necessity/level of care/duration of stay  If discussed at [place] of Stay Meetings, dates discussed:   03/04/2015  03/09/2015    Comments:  03/09/15- 1000- Marvetta Gibbons RN, BSN [phone] Pt for d/c home today, wants HH-RN- MD however does not feel pt needs HH-RN and no order placed for [facility]- this was explained to pt and she understands that no HH will be arranged. No other d/c needs- pt to d/c home with family.  03/05/15 0915 Henrietta Mayo RN MSN BSN CCM S/P (L) atrial myxoma resection

## 2015-03-09 NOTE — Progress Notes (Signed)
CARDIAC REHAB PHASE I  Ed completed with pt. Voiced understanding. Interested in [facility] and will send referral to [facility]. [phone]  Josephina Shih Nordic CES, ACSM 03/09/2015 10:59 AM

## 2015-03-09 NOTE — Progress Notes (Signed)
Pt discharged per MD order and protocol. Discharge instructions reviewed with patient and all questions answered. Pt given all prescriptions and aware of follow up appointments.  

## 2015-03-17 ENCOUNTER — Ambulatory Visit (INDEPENDENT_AMBULATORY_CARE_PROVIDER_SITE_OTHER): Payer: Self-pay

## 2015-03-17 DIAGNOSIS — I5189 Other ill-defined heart diseases: Secondary | ICD-10-CM

## 2015-03-17 DIAGNOSIS — Z4802 Encounter for removal of sutures: Secondary | ICD-10-CM

## 2015-03-17 NOTE — Progress Notes (Signed)
Removed 2 Sutures from chest tube incision sites. No signs of infection and patient tolerated well.

## 2015-03-18 ENCOUNTER — Telehealth: Payer: Self-pay | Admitting: *Deleted

## 2015-03-18 NOTE — Telephone Encounter (Signed)
Faxed orders for phase 2 cardiac rehab - no GXT required.  

## 2015-03-23 ENCOUNTER — Other Ambulatory Visit: Payer: Self-pay | Admitting: Thoracic Surgery (Cardiothoracic Vascular Surgery)

## 2015-03-23 ENCOUNTER — Telehealth (HOSPITAL_COMMUNITY): Payer: Self-pay | Admitting: Cardiac Rehabilitation

## 2015-03-23 DIAGNOSIS — D151 Benign neoplasm of heart: Secondary | ICD-10-CM

## 2015-03-23 NOTE — Telephone Encounter (Signed)
pc to pt to discuss enrolling in cardiac rehab via use of telephonic interpreting service.  Pt interested in participating in cardiac rehab however pt c/o pain, swelling and warmth of right nipple that began yesterday. Pt denies drainage from nipple or recent breastfeeding.pt reports some relief from pain medication, no relief when warm compress applied.  Afebrile.  Pt also c/o cough.  Pt reports drainage from incision sight last week that has now resolved.  Via interpreter service, phone call placed to Dr. Guy Sandifer nurse Manuela Schwartz.  Manuela Schwartz will review with Dr. Roxy Manns and further advise pt.  Pt also made aware of scheduled hospital f/u appts, time and place. Pt verbalized understanding.

## 2015-03-24 ENCOUNTER — Ambulatory Visit (INDEPENDENT_AMBULATORY_CARE_PROVIDER_SITE_OTHER): Payer: Self-pay | Admitting: Physician Assistant

## 2015-03-24 VITALS — BP 116/75 | HR 66 | Resp 16 | Ht 64.0 in | Wt 199.5 lb

## 2015-03-24 DIAGNOSIS — D151 Benign neoplasm of heart: Secondary | ICD-10-CM

## 2015-03-24 MED ORDER — OXYCODONE HCL 5 MG PO TABS: 5.0000 mg | ORAL_TABLET | ORAL | Status: DC | PRN

## 2015-03-24 NOTE — Patient Instructions (Signed)
La diabetes mellitus y los alimentos (Diabetes Mellitus and Food) Es importante que controle su nivel de azcar en la sangre (glucosa). El nivel de glucosa en sangre depende en gran medida de lo que usted come. Comer alimentos saludables en las cantidades adecuadas a lo largo del da, aproximadamente a la misma hora todos los das, lo ayudar a controlar su nivel de glucosa en sangre. Tambin puede ayudarlo a retrasar o evitar el empeoramiento de la diabetes mellitus. Comer de manera saludable incluso puede ayudarlo a mejorar el nivel de presin arterial y a alcanzar o mantener un peso saludable.  CMO PUEDEN AFECTARME LOS ALIMENTOS? Carbohidratos Los carbohidratos afectan el nivel de glucosa en sangre ms que cualquier otro tipo de alimento. El nutricionista lo ayudar a determinar cuntos carbohidratos puede consumir en cada comida y ensearle a contarlos. El recuento de carbohidratos es importante para mantener la glucosa en sangre en un nivel saludable, en especial si utiliza insulina o toma determinados medicamentos para la diabetes mellitus. Alcohol El alcohol puede provocar disminuciones sbitas de la glucosa en sangre (hipoglucemia), en especial si utiliza insulina o toma determinados medicamentos para la diabetes mellitus. La hipoglucemia es una afeccin que puede poner en peligro la vida. Los sntomas de la hipoglucemia (somnolencia, mareos y desorientacin) son similares a los sntomas de haber consumido mucho alcohol.  Si el mdico lo autoriza a beber alcohol, hgalo con moderacin y siga estas pautas:  Las mujeres no deben beber ms de un trago por da, y los hombres no deben beber ms de dos tragos por da. Un trago es igual a:  12 onzas (355 ml) de cerveza  5 onzas de vino (150 ml) de vino  1,5onzas (45ml) de bebidas espirituosas  No beba con el estmago vaco.  Mantngase hidratado. Beba agua, gaseosas dietticas o t helado sin azcar.  Las gaseosas comunes, los jugos y  otros refrescos podran contener muchos carbohidratos y se deben contar. QU ALIMENTOS NO SE RECOMIENDAN? Cuando haga las elecciones de alimentos, es importante que recuerde que todos los alimentos son distintos. Algunos tienen menos nutrientes que otros por porcin, aunque podran tener la misma cantidad de caloras o carbohidratos. Es difcil darle al cuerpo lo que necesita cuando consume alimentos con menos nutrientes. Estos son algunos ejemplos de alimentos que debera evitar ya que contienen muchas caloras y carbohidratos, pero pocos nutrientes:  Grasas trans (la mayora de los alimentos procesados incluyen grasas trans en la etiqueta de Informacin nutricional).  Gaseosas comunes.  Jugos.  Caramelos.  Dulces, como tortas, pasteles, rosquillas y galletas.  Comidas fritas. QU ALIMENTOS PUEDO COMER? Consuma alimentos ricos en nutrientes, que nutrirn el cuerpo y lo mantendrn saludable. Los alimentos que debe comer tambin dependern de varios factores, como:  Las caloras que necesita.  Los medicamentos que toma.  Su peso.  El nivel de glucosa en sangre.  El nivel de presin arterial.  El nivel de colesterol. Tambin debe consumir una variedad de alimentos, como:  Protenas, como carne, aves, pescado, tofu, frutos secos y semillas (las protenas de animales magros son mejores).  Frutas.  Verduras.  Productos lcteos, como leche, queso y yogur (descremados son mejores).  Panes, granos, pastas, cereales, arroz y frijoles.  Grasas, como aceite de oliva, margarina sin grasas trans, aceite de canola, aguacate y aceitunas. TODOS LOS QUE PADECEN DIABETES MELLITUS TIENEN EL MISMO PLAN DE COMIDAS? Dado que todas las personas que padecen diabetes mellitus son distintas, no hay un solo plan de comidas que funcione para todos. Es muy   importante que se rena con un nutricionista que lo ayudar a crear un plan de comidas adecuado para usted. Document Released: 03/12/2008  Document Revised: 12/09/2013 [employer] Patient Information 2015 [employer]. This information is not intended to replace advice given to you by your health care provider. Make sure you discuss any questions you have with your health care provider.  

## 2015-03-24 NOTE — Progress Notes (Signed)
  HPI:  Patient presents to the office today with complaints of swelling and painful right breast. She also has complaints of a dry cough, which she had prior to surgery. She denies any shortness of breath, fever, chills, or drainage from wounds. The patient's early postoperative recovery while in the hospital was notable for junctional rhythm and bradycardia. As a result, she was not put on a beta blocker.   Current Outpatient Prescriptions  Medication Sig Dispense Refill  . aspirin EC 325 MG EC tablet Take 1 tablet (325 mg total) by mouth daily. 30 tablet 0  . ferrous sulfate 325 (65 FE) MG tablet Take 1 tablet (325 mg total) by mouth daily with breakfast. For one month then stop.  3  . folic acid (FOLVITE) 1 MG tablet Take 1 tablet (1 mg total) by mouth daily. For one month then stop.    Marland Kitchen ibuprofen (ADVIL,MOTRIN) 200 MG tablet Take 800 mg by mouth once as needed for mild pain or moderate pain.    . metFORMIN (GLUCOPHAGE) 500 MG tablet Take 500 mg by mouth 2 (two) times daily.    Derrill Memo ON 04/12/2015] Multiple Vitamin (MULTIVITAMIN WITH MINERALS) TABS tablet Take 1 tablet by mouth daily.    Marland Kitchen oxyCODONE (OXY IR/ROXICODONE) 5 MG immediate release tablet Take 1-2 tablets (5-10 mg total) by mouth every 4 (four) hours as needed for severe pain. 30 tablet 0  Vital Signs: HR 66, BP 116/75, RR 16, Oxygen saturation 98% on room air  Physical Exam: CV: RRR Pulmonary: Clear to auscultation bilaterally Extremities: No LE edema Wounds: Right anterior chest wound is clean, dry, well healed. There is some derma bond still present. There is no erythema, drainage, or signs of infection. There is minor ecchymosis medial of wound and there is tenderness just above wound. The right groin incision is clean, dry, and there are no signs of infection. The two chest tube sites have small eschars and these were removed.   Impression and Plan: Overall, Mrs. Slatten is recovering well. Regarding her cough,  she states she has had a dry cough prior to surgery and that it has worsened since discharge, especially at night. She has no history of seasonal allergies and denies post nasal drip. She is a non smoker. She is not on an ACE. She has taken Robitussin, but that has not helped.  Perhaps, she is having reflux so I told her she may take an over the counter proton pump inhibitor Prilosec, etc to see if that helps. I also instructed her to not sleep completely flat. She stated does not sleep flat as it is not comfortable. Her last chest x ray, prior to discharge, did not show any acute abnormality.  She may have a small hematoma proximal to her right chest wound. There is no sign of infection. Hopefully, over time, the pain will lessen and this could take a few more weeks. She asked for refill on Oxy, which I gave her. She also asked when she could drive. I told her that she is not to drive yet as she is still taking narcotics for pain and not until released to do so by Dr. Roxy Manns. She has an appointment to see him on Monday 03/29/2015.    Nani Skillern, PA-C [facility] [phone]

## 2015-03-26 ENCOUNTER — Encounter: Payer: Self-pay | Admitting: Physician Assistant

## 2015-03-26 ENCOUNTER — Ambulatory Visit (INDEPENDENT_AMBULATORY_CARE_PROVIDER_SITE_OTHER): Payer: No Typology Code available for payment source | Admitting: Physician Assistant

## 2015-03-26 VITALS — BP 110/64 | HR 68 | Ht 64.0 in | Wt 198.0 lb

## 2015-03-26 DIAGNOSIS — D151 Benign neoplasm of heart: Secondary | ICD-10-CM | POA: Diagnosis not present

## 2015-03-26 DIAGNOSIS — R079 Chest pain, unspecified: Secondary | ICD-10-CM | POA: Diagnosis not present

## 2015-03-26 NOTE — Patient Instructions (Addendum)
Your main incision is healing well. There are 3 small areas below it, one of them is not quite healed. Triple antibiotic cream is over-the-counter, okay to get some and apply it twice a day.  It is okay to wear a bra once that site is completely healed.  Robitussin-DM is an over-the-counter cough medicine that you can take or your cough. The directions are on the bottle. It will not interfere with any other medications.  If the soreness in your right breast/nipple does not improve or the firm area near your incision does not improve in another 6 weeks or so, contact the surgeons. It is okay to take Tylenol for the pain.  Follow up with Dr. Debara Pickett in 3 months, or sooner if needed.

## 2015-03-26 NOTE — Progress Notes (Signed)
Cardiology Office Note   Date:  03/26/2015   ID:  Rumor, Sun 08-12-1976, MRN [id number]  PCP:  No PCP Per Patient  Cardiologist:  Dr. Verneita Griffes Barrett, PA-C   No chief complaint on file.  post hospital follow-up, right breast pain  History of Present Illness: Debra Diaz is a 39 y.o. female with a history of left atrial myxoma, treated surgically. Discharge 03/09/2015.  She is here today to be seen in follow-up. A translator is present.  She feels she is doing fairly well. She is walking daily and can walk 3 blocks without stopping. She denies lower extremity edema. She denies orthopnea, or PND. She has continued to have right chest discomfort and right nipple pain since the surgery. Her main incision is at the top of her right breast and there are 3 smaller incisions at the lower rib edge from chest tubes and other procedures.  Her other major complaint is a dry cough. She was having before the surgery and it has continued since the surgery, sometimes keeping her awake at night. The cough is harsh but nonproductive. There have been no fevers.  Past Medical History  Diagnosis Date  . Hypertension   . Diabetes mellitus without complication   . Morbid obesity   . 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 Cesarean section  1999 and 2011    x 2  . Tubal ligation  2011  . Tee without cardioversion N/A 03/01/2015    Procedure: TRANSESOPHAGEAL ECHOCARDIOGRAM (TEE);  Surgeon: [name], MD;  Location[facility] ENDOSCOPY;  Service: Cardiovascular;  Laterality: N/A;  . Left heart catheterization with coronary angiogram N/A 03/02/2015    Procedure: LEFT HEART CATHETERIZATION WITH CORONARY ANGIOGRAM;  Surgeon: [name], MD;  Location: [facility] CATH LAB;  Service: Cardiovascular;  Laterality: N/A;  . Minimally invasive excision of atrial myxoma N/A 03/04/2015    Procedure:  MINIMALLY INVASIVE RESECTION OF LEFT ATRIAL MYXOMA ( USING A BILAYER PATCH CLOSURE);  Surgeon: [name], MD;  Location: [facility];  Service: Open Heart Surgery;  Laterality: N/A;  . Tee without cardioversion N/A 03/04/2015    Procedure: TRANSESOPHAGEAL ECHOCARDIOGRAM (TEE);  Surgeon: [name], MD;  Location: [facility];  Service: Open Heart Surgery;  Laterality: N/A;    Current Outpatient Prescriptions  Medication Sig Dispense Refill  . aspirin EC 325 MG EC tablet Take 1 tablet (325 mg total) by mouth daily. 30 tablet 0  . ferrous sulfate 325 (65 FE) MG tablet Take 1 tablet (325 mg total) by mouth daily with breakfast. For one month then stop.  3  . folic acid (FOLVITE) 1 MG tablet Take 1 tablet (1 mg total) by mouth daily. For one month then stop.    Marland Kitchen ibuprofen (ADVIL,MOTRIN) 200 MG tablet Take 800 mg by mouth once as needed for mild pain or moderate pain.    . metFORMIN (GLUCOPHAGE) 500 MG tablet Take 500 mg by mouth 2 (two) times daily.    Derrill Memo ON 04/12/2015] Multiple Vitamin (MULTIVITAMIN WITH MINERALS) TABS tablet Take 1 tablet by mouth daily.    Marland Kitchen oxyCODONE (OXY IR/ROXICODONE) 5 MG immediate release tablet Take 1-2 tablets (5-10 mg total) by mouth every 4 (four) hours as needed for severe pain. 30 tablet 0   No current facility-administered medications for this visit.    Allergies:   Review of patient's allergies indicates  no known allergies.    Social History:  The patient  reports that she has never smoked. She does not have any smokeless tobacco history on file. She reports that she drinks alcohol. She reports that she does not use illicit drugs.   Family History:  The patient's family history includes Diabetes in her father; Hypertension in her mother.    ROS:  Please see the history of present illness. All other systems are reviewed and negative.    PHYSICAL EXAM: VS:  BP 110/64 mmHg  Pulse 68  Ht 5\' 4"  (1.626 m)  Wt 198 lb (89.812 kg)  BMI 33.97 kg/m2  LMP  02/19/2015 , BMI Body mass index is 33.97 kg/(m^2). GEN: Well nourished, well developed, in no acute distress HEENT: normal Neck: no JVD, carotid bruits, or masses Cardiac: RRR; no murmurs, rubs, or gallops,no edema  Respiratory:  Few rales in the left base, normal work of breathing, good chest expansion. There is a 1 cm x 4 cm area of firmness in the area of her incision. There is some resolving bruising as well. The nipple itself has no drainage and there is no hematoma underneath it. GI: soft, nontender, nondistended, + BS MS: no deformity or atrophy Skin: warm and dry, no rash Neuro:  Strength and sensation are intact Psych: euthymic mood, full affect   EKG:  EKG is ordered today. The ekg ordered today demonstrates heart rate 68, possible high junctional rhythm with narrow complexes, change from previous ECG  Recent Labs: 02/27/2015: ALT 22 03/05/2015: Magnesium 2.2 03/08/2015: BUN 9; Creatinine 0.51; Hemoglobin 9.2*; Platelets 251; Potassium 3.7; Sodium 136    Lipid Panel No results found for: CHOL, TRIG, HDL, CHOLHDL, VLDL, LDLCALC, LDLDIRECT   Wt Readings from Last 3 Encounters:  03/26/15 198 lb (89.812 kg)  03/24/15 199 lb 8 oz (90.493 kg)  03/09/15 204 lb 9.4 oz (92.8 kg)     Other studies Reviewed: Additional studies/ records that were reviewed today include: Discharge summary, ECG.  ASSESSMENT AND PLAN: 1.  Left atrial myxoma, status post minimally invasive surgery: She is recovering well from the surgery. One of her chest tube sites is not draining or looking infected but it is not quite completely healed. She is encouraged to use triple antibiotic cream and watch it to make sure that it heals completely. She is to contact the surgeons if there are any issues or concerns. She is encouraged to take Tylenol for the discomfort in her right nipple area and to watch that area to make sure the firmness does not enlarge. She is to contact the surgeons if she feels it is  enlarging.  2. Cough: She has good expansion on her lungs and only a few Rales in her bases. I advised her of this and offered to check a chest x-ray but she declined. Advised her she could take Robitussin over-the-counter cough medication as needed and that would be fine. She is in agreement and will contact us or her family physician if the cough becomes productive or she starts running fevers.  3. Arrhythmia: ECG this visit shows junctional rhythm, rate 68. She is asymptomatic with this. She is not on any rate-lowering medications. No further evaluation or treatment is needed at this time. Old ECGs were reviewed and were SR before the surgery, A-paced right after the surgery. Hopefully this will resolve with time.  Current medicines are reviewed at length with the patient today.  The patient does not have concerns regarding medicines.  The  following changes have been made:  Okay to take Robitussin-DM and use triple antibiotic cream  Labs/ tests ordered today include: ECG   Disposition:   FU with Dr. Debara Pickett in 3 months   Signed, Debra Ferries, PA-C  03/26/2015 11:00 AM    [address], [place], [place]  [address] Phone: [phone]; Fax: [phone]

## 2015-03-29 ENCOUNTER — Ambulatory Visit (INDEPENDENT_AMBULATORY_CARE_PROVIDER_SITE_OTHER): Payer: Self-pay | Admitting: Physician Assistant

## 2015-03-29 ENCOUNTER — Ambulatory Visit
Admission: RE | Admit: 2015-03-29 | Discharge: 2015-03-29 | Disposition: A | Discharge status: Home / Self Care | Payer: No Typology Code available for payment source | Source: Ambulatory Visit | Attending: Thoracic Surgery (Cardiothoracic Vascular Surgery) | Admitting: Thoracic Surgery (Cardiothoracic Vascular Surgery)

## 2015-03-29 ENCOUNTER — Ambulatory Visit: Payer: No Typology Code available for payment source | Admitting: Thoracic Surgery (Cardiothoracic Vascular Surgery)

## 2015-03-29 VITALS — BP 105/67 | HR 61 | Resp 16 | Ht 64.0 in | Wt 200.0 lb

## 2015-03-29 DIAGNOSIS — D151 Benign neoplasm of heart: Secondary | ICD-10-CM

## 2015-03-29 DIAGNOSIS — R05 Cough: Secondary | ICD-10-CM

## 2015-03-29 DIAGNOSIS — R059 Cough, unspecified: Secondary | ICD-10-CM

## 2015-03-29 MED ORDER — BENZONATATE 100 MG PO CAPS: 100.0000 mg | ORAL_CAPSULE | Freq: Three times a day (TID) | ORAL | Status: DC | PRN

## 2015-03-29 NOTE — Progress Notes (Signed)
[address]       [place] [address]             [phone]          HPI: Patient returns for routine postoperative follow-up having undergone minimally invasive resection of left atrial myxoma by Dr. Roxy Manns on 03/04/2015. The patient's postoperative course was notable for junctional bradycardia, and she was not started on a beta blocker during her hospital stay.  She was discharged home on 03/09/2015 in good condition.   The patient was seen in our office on 4/6 complaining of swelling and pain in her right breast. She was noted to have a small hematoma without infection, and did not require additional treatment.  Today, she notes via interpreter that she still has tenderness over her right breast, worse at night.  She is not taking any narcotic pain medication during the day, but gets quite sore when she lies down flat at night.  She also has developed serous drainage from the central chest tube site over the past few days.  There has been no erythema or purulence, and she denies fever or chills.  She continues to have a nonproductive, dry cough, which was present prior to surgery, but has gotten some better.  She states that it really hurts her chest to cough. She was seen in the cardiology office last week and no changes were made in her medications.  She is walking multiple times daily and denies shortness of breath. Appetite is good.      Current Outpatient Prescriptions  Medication Sig Dispense Refill  . aspirin EC 325 MG EC tablet Take 1 tablet (325 mg total) by mouth daily. 30 tablet 0  . ferrous sulfate 325 (65 FE) MG tablet Take 1 tablet (325 mg total) by mouth daily with breakfast. For one month then stop.  3  . folic acid (FOLVITE) 1 MG tablet Take 1 tablet (1 mg total) by mouth daily. For one month then stop.    Marland Kitchen ibuprofen (ADVIL,MOTRIN) 200 MG tablet Take 800 mg by mouth once as needed for mild pain or moderate pain.    . metFORMIN (GLUCOPHAGE) 500 MG  tablet Take 500 mg by mouth 2 (two) times daily.    Derrill Memo ON 04/12/2015] Multiple Vitamin (MULTIVITAMIN WITH MINERALS) TABS tablet Take 1 tablet by mouth daily.    Marland Kitchen oxyCODONE (OXY IR/ROXICODONE) 5 MG immediate release tablet Take 1-2 tablets (5-10 mg total) by mouth every 4 (four) hours as needed for severe pain. 30 tablet 0  . benzonatate (TESSALON PERLES) 100 MG capsule Take 1 capsule (100 mg total) by mouth 3 (three) times daily as needed for cough. 20 capsule 0   No current facility-administered medications for this visit.     Physical Exam: BP 105/67 HR 61 Resp 16 Wounds: Right mini-thoracotomy and groin incisions are healing well, no erythema or drainage. There is some resolving ecchymosis medial to the incision which is tender, but with no fluctuance.  The center chest tube site appears to have some eschar, and when probed, a small amount of serous fluid is expressible.  No erythema or purulence. Heart: regular rate and rhythm Lungs: Clear to auscultation Extremities: No edema   Diagnostic Tests: Chest xray: No results found.     Assessment/Plan: The patient is overall progressing well status post mini-thoracotomy for removal of left atrial myxoma.  She has a small amount of serous drainage from the chest tube site,  but there is no evidence of infection.  She continues to complain of cough, which was present prior to surgery.  I have sent her for a 2 view chest x-ray today to rule out any acute pulmonary issues.  I also gave her a prescription for Tessalon Perles to help with the cough, since Robitussin has not been effective.  She may begin driving and slowly increasing her activity as tolerated.  She will return to the office to see Dr. Roxy Manns in 3-4 weeks, and may call in the interim if she has any problems.

## 2015-04-01 ENCOUNTER — Ambulatory Visit: Payer: No Typology Code available for payment source | Admitting: Cardiology

## 2015-04-23 ENCOUNTER — Other Ambulatory Visit: Payer: Self-pay | Admitting: Thoracic Surgery (Cardiothoracic Vascular Surgery)

## 2015-04-23 DIAGNOSIS — I5189 Other ill-defined heart diseases: Secondary | ICD-10-CM

## 2015-04-26 ENCOUNTER — Ambulatory Visit (INDEPENDENT_AMBULATORY_CARE_PROVIDER_SITE_OTHER): Payer: Self-pay | Admitting: Thoracic Surgery (Cardiothoracic Vascular Surgery)

## 2015-04-26 ENCOUNTER — Ambulatory Visit: Admission: RE | Admit: 2015-04-26 | Payer: Self-pay | Source: Ambulatory Visit

## 2015-04-26 ENCOUNTER — Encounter: Payer: Self-pay | Admitting: Thoracic Surgery (Cardiothoracic Vascular Surgery)

## 2015-04-26 ENCOUNTER — Ambulatory Visit
Admission: RE | Admit: 2015-04-26 | Discharge: 2015-04-26 | Disposition: A | Discharge status: Home / Self Care | Payer: No Typology Code available for payment source | Source: Ambulatory Visit | Attending: Thoracic Surgery (Cardiothoracic Vascular Surgery) | Admitting: Thoracic Surgery (Cardiothoracic Vascular Surgery)

## 2015-04-26 VITALS — BP 105/61 | HR 60 | Resp 16 | Ht 64.0 in

## 2015-04-26 DIAGNOSIS — I5189 Other ill-defined heart diseases: Secondary | ICD-10-CM

## 2015-04-26 DIAGNOSIS — D151 Benign neoplasm of heart: Secondary | ICD-10-CM | POA: Insufficient documentation

## 2015-04-26 NOTE — Progress Notes (Signed)
      [address]       [place] [address]             [phone]     CARDIOTHORACIC SURGERY OFFICE NOTE  Referring Provider is Belva Crome, MD PCP is No PCP Per Patient   HPI:  Patient returns for routine follow-up status post minimally invasive resection of left atrial myxoma on 03/04/2015.  The patient's postoperative recovery was uncomplicated and she was discharged from the hospital on the fifth postoperative day.  She was last seen here in our office on 03/29/2015 and she returns for routine follow-up today. She states that she is doing very well. She no longer has any significant pain in her chest. She has no shortness of breath. She denies any palpitations or dizzy spells. Her appetite is good. She is sleeping well at night. She states that her blood sugars have been under good control. She has no complaints and feels well.   Current Outpatient Prescriptions  Medication Sig Dispense Refill  . aspirin EC 325 MG EC tablet Take 1 tablet (325 mg total) by mouth daily. 30 tablet 0  . ibuprofen (ADVIL,MOTRIN) 200 MG tablet Take 800 mg by mouth once as needed for mild pain or moderate pain.    . metFORMIN (GLUCOPHAGE) 500 MG tablet Take 500 mg by mouth 2 (two) times daily.     No current facility-administered medications for this visit.      Physical Exam:   BP 105/61 mmHg  Pulse 60  Resp 16  Ht 5\' 4"  (1.626 m)  SpO2 99%  General:  Well-appearing  Chest:   Clear to auscultation  CV:   Regular rate and rhythm without murmur  Incisions:  Healing nicely  Abdomen:  Soft and nontender  Extremities:  Warm and well-perfused  Diagnostic Tests:  CHEST 2 VIEW  COMPARISON: 03/29/2015.  FINDINGS: Mediastinum hilar structures normal. Low lung volumes with mild bibasilar atelectasis. Heart size normal. Pulmonary vascularity normal. No pleural effusion or pneumothorax. No acute bony abnormality.  IMPRESSION: No active cardiopulmonary  disease.   Electronically Signed  By: Marcello Moores Register  On: 04/26/2015 16:29    Impression:  Patient is doing very well more than 6 weeks status post minimally invasive resection of left atrial myxoma.    Plan:  I have encouraged the patient to continue to increase her physical activity without any particular limitations at this time. I have encouraged her to participate in the cardiac rehabilitation program. She may resume driving an automobile.  I have recommended that she remain on aspirin for 2 months. We have reminded her to continue to work hard to keep her diabetes under good control. All of her questions have been addressed.  She will return for routine follow-up and surveillance next March, approximately 1 year after her original surgery. During the interim period of time she will continue to follow-up with Dr. Tamala Julian.    Valentina Gu. Roxy Manns, MD 04/26/2015 5:00 PM

## 2015-04-26 NOTE — Patient Instructions (Addendum)
The patient may continue to gradually increase their physical activity as tolerated.  They should refrain from any heavy lifting or strenuous use of their arms and shoulders until at least 8 weeks from the time of their surgery, and they should avoid activities that cause increased pain in their chest on the side of their surgical incision.  Otherwise they may continue to increase their activities without any particular limitations.  The patient may return to driving an automobile as long as they are no longer requiring oral narcotic pain relievers during the daytime.  It would be wise to start driving only short distances during the daylight and gradually increase from there as they feel comfortable.  The patient should remain on aspirin daily for 2 months  The patient is encouraged to enroll and participate in the outpatient cardiac rehab program beginning as soon as practical.  The patient is reminded to make every effort to keep their diabetes under very tight control.  They should follow up closely with their primary care physician or endocrinologist and strive to keep their hemoglobin A1c levels as low as possible, preferably near or below 6.0

## 2015-07-08 ENCOUNTER — Emergency Department (HOSPITAL_COMMUNITY)
Admission: EM | Admit: 2015-07-08 | Discharge: 2015-07-08 | Disposition: A | Discharge status: Home / Self Care | Payer: No Typology Code available for payment source | Attending: Emergency Medicine | Admitting: Emergency Medicine

## 2015-07-08 ENCOUNTER — Emergency Department (HOSPITAL_COMMUNITY): Payer: No Typology Code available for payment source

## 2015-07-08 ENCOUNTER — Encounter (HOSPITAL_COMMUNITY): Payer: Self-pay | Admitting: Emergency Medicine

## 2015-07-08 DIAGNOSIS — Y9389 Activity, other specified: Secondary | ICD-10-CM | POA: Insufficient documentation

## 2015-07-08 DIAGNOSIS — S0990XA Unspecified injury of head, initial encounter: Secondary | ICD-10-CM | POA: Diagnosis not present

## 2015-07-08 DIAGNOSIS — I10 Essential (primary) hypertension: Secondary | ICD-10-CM | POA: Diagnosis not present

## 2015-07-08 DIAGNOSIS — Y92410 Unspecified street and highway as the place of occurrence of the external cause: Secondary | ICD-10-CM | POA: Insufficient documentation

## 2015-07-08 DIAGNOSIS — R0789 Other chest pain: Secondary | ICD-10-CM

## 2015-07-08 DIAGNOSIS — N644 Mastodynia: Secondary | ICD-10-CM

## 2015-07-08 DIAGNOSIS — Z7982 Long term (current) use of aspirin: Secondary | ICD-10-CM | POA: Insufficient documentation

## 2015-07-08 DIAGNOSIS — Y998 Other external cause status: Secondary | ICD-10-CM | POA: Insufficient documentation

## 2015-07-08 DIAGNOSIS — S29001A Unspecified injury of muscle and tendon of front wall of thorax, initial encounter: Secondary | ICD-10-CM | POA: Insufficient documentation

## 2015-07-08 DIAGNOSIS — Z79899 Other long term (current) drug therapy: Secondary | ICD-10-CM | POA: Diagnosis not present

## 2015-07-08 DIAGNOSIS — L7682 Other postprocedural complications of skin and subcutaneous tissue: Secondary | ICD-10-CM

## 2015-07-08 LAB — I-STAT CHEM 8, ED
BUN: 11 mg/dL (ref 6–20)
Calcium, Ion: 1.18 mmol/L (ref 1.12–1.23)
Chloride: 103 mmol/L (ref 101–111)
Creatinine, Ser: 0.6 mg/dL (ref 0.44–1.00)
Glucose, Bld: 100 mg/dL — ABNORMAL HIGH (ref 65–99)
HCT: 37 % (ref 36.0–46.0)
Hemoglobin: 12.6 g/dL (ref 12.0–15.0)
Potassium: 4.2 mmol/L (ref 3.5–5.1)
Sodium: 140 mmol/L (ref 135–145)
TCO2: 23 mmol/L (ref 0–100)

## 2015-07-08 LAB — I-STAT TROPONIN, ED: Troponin i, poc: 0 ng/mL (ref 0.00–0.08)

## 2015-07-08 MED ORDER — HYDROCODONE-ACETAMINOPHEN 5-325 MG PO TABS: 1.0000 | ORAL_TABLET | Freq: Four times a day (QID) | ORAL | Status: DC | PRN

## 2015-07-08 MED ORDER — HYDROCODONE-ACETAMINOPHEN 5-325 MG PO TABS
1.0000 | ORAL_TABLET | Freq: Once | ORAL | Status: AC
Start: 2015-07-08 — End: 2015-07-08
  Administered 2015-07-08: 1 via ORAL
  Filled 2015-07-08: qty 1
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
## 2015-09-06 ENCOUNTER — Ambulatory Visit (INDEPENDENT_AMBULATORY_CARE_PROVIDER_SITE_OTHER): Payer: 59 | Admitting: Family Medicine

## 2015-09-06 VITALS — BP 118/78 | HR 70 | Temp 99.4°F | Ht 64.0 in | Wt 196.8 lb

## 2015-09-06 DIAGNOSIS — I10 Essential (primary) hypertension: Secondary | ICD-10-CM | POA: Diagnosis not present

## 2015-09-06 DIAGNOSIS — E119 Type 2 diabetes mellitus without complications: Secondary | ICD-10-CM

## 2015-09-06 DIAGNOSIS — R51 Headache: Secondary | ICD-10-CM | POA: Diagnosis not present

## 2015-09-06 DIAGNOSIS — R519 Headache, unspecified: Secondary | ICD-10-CM

## 2015-09-06 LAB — CBC
HCT: 36.1 % (ref 36.0–46.0)
Hemoglobin: 11.5 g/dL — ABNORMAL LOW (ref 12.0–15.0)
MCH: 23.9 pg — ABNORMAL LOW (ref 26.0–34.0)
MCHC: 31.9 g/dL (ref 30.0–36.0)
MCV: 75.1 fL — ABNORMAL LOW (ref 78.0–100.0)
MPV: 10.2 fL (ref 8.6–12.4)
Platelets: 316 10*3/uL (ref 150–400)
RBC: 4.81 MIL/uL (ref 3.87–5.11)
RDW: 17.8 % — ABNORMAL HIGH (ref 11.5–15.5)
WBC: 9.1 10*3/uL (ref 4.0–10.5)

## 2015-09-06 LAB — BASIC METABOLIC PANEL
BUN: 9 mg/dL (ref 7–25)
CO2: 28 mmol/L (ref 20–31)
Calcium: 9.3 mg/dL (ref 8.6–10.2)
Chloride: 101 mmol/L (ref 98–110)
Creat: 0.51 mg/dL (ref 0.50–1.10)
Glucose, Bld: 99 mg/dL (ref 65–99)
Potassium: 4.3 mmol/L (ref 3.5–5.3)
Sodium: 138 mmol/L (ref 135–146)

## 2015-09-06 LAB — THYROID PANEL WITH TSH
Free Thyroxine Index: 2.2 (ref 1.4–3.8)
T3 Uptake: 27 % (ref 22–35)
T4, Total: 8.3 ug/dL (ref 4.5–12.0)
TSH: 1.229 u[IU]/mL (ref 0.350–4.500)

## 2015-09-06 LAB — POCT HEMOGLOBIN: Hemoglobin: 11.3 g/dL — AB (ref 12.2–16.2)

## 2015-09-06 MED ORDER — LISINOPRIL-HYDROCHLOROTHIAZIDE 20-25 MG PO TABS: 1.0000 | ORAL_TABLET | Freq: Every day | ORAL | Status: DC

## 2015-09-06 NOTE — Patient Instructions (Addendum)
Thank you for coming in today, it was so nice to meet you! Today we discussed you headache, diabetes, and high blood pressure. For your headache, I would like you to increase the dose of tylenol you are taking. You can take Tylenol extra strength. Also, please drink lots of water to rehydrate. If your headache does not improve, please call the clinic. You will also have lab work today.  I would like to see you back in 1 month.   If you have any questions or concerns, please do not hesitate to call the office at [phone].  Sincerely,  Smitty Cords, MD [place] general sin causa  (General Headache Without Cause)  Un dolor de cabeza en general es un dolor o malestar que se siente en la zona de la cabeza o del cuello. Se desconocen las causas.  CUIDADOS EN EL HOGAR   Cumpla con los controles mdicos segn las indicaciones.  Tome slo los medicamentos que le haya indicado el mdico.  Cuando sienta dolor de cabeza acustese en un cuarto oscuro y tranquilo.  Lleve un registro diario para averiguar si ciertas cosas provocan dolores de cabeza. Por ejemplo, escriba:  Lo que come y bebe.  Cunto tiempo duerme.  Todo cambio en la dieta o medicamentos.  Reljese recibiendo masajes o haga otras actividades relajantes.  Coloque hielo o calor en la cabeza y el cuello como lo indique su mdico.  DISMINUYA EL NIVEL DE ESTRS  Sintase con la espalda recta. No apriete los msculos (tensione).  Si fuma, deje de hacerlo.  Beba menos alcohol.  Consuma menos cafena o deje de tomarla.  Coma y duerma en horarios regulares.  Duerma entre 7 y 84 horas o como le indique su [place] luces tenues si le Ronald cabeza empeoran. SOLICITE AYUDA DE INMEDIATO SI:   El dolor de [place].  Tiene fiebre.  Presenta rigidez en el cuello.  Tiene dificultad para ver.  Sus msculos estn dbiles o pierde el control  muscular.  Pierde equilibrio o tiene problemas para Writer.  Siente que se desvanece (debilidad) o se desmaya.  Tiene sntomas intensos que son diferentes a los primeros sntomas.  Tiene problemas con los medicamentos que le recet su mdico.  El medicamento no le hace efecto.  Siente que el dolor de [place] es diferente a otros dolores de [place].  Tiene malestar estomacal (nuseas) o (vmitos). ASEGRESE DE QUE:   Comprende estas instrucciones.  Controlar su enfermedad.  Solicitar ayuda de inmediato si no mejora o si empeora. Document Released: 02/26/2012 [facility] Patient Information 2015 [place]. This information is not intended to replace advice given to you by your health care provider. Make sure you discuss any questions you have with your health care provider.

## 2015-09-06 NOTE — Progress Notes (Signed)
Subjective:    Patient ID: Debra Diaz , female   DOB: 05-30-1976 , 39 y.o..   MRN: [id number]  HPI  Debra Diaz is here to establish care. History was provided by patient and [employer], Clifton James [phone]. Patient has 1 primary complaint today which is headache.  HEADACHE  Patient complains of headaches on the "top of" her head for 3 days and it is constant in frequency. Description of pain: pounding. Associated symptoms: none. Pain relief: unable to obtain relief with "200mg Tylenol". Precipitating factors: patient is aware of none. She denies a history of recent head injury. She admits to helping her husband paint the outside of a house 3 days ago, she recalls being very hot. She admits to not drinking much water, approximately 1 cup/day.  Prior neurological history: negative for no neurological problems. Neurologic Review of Systems - no TIA or stroke-like symptoms.  Type 2 DM Diabetic Review of Systems - medication compliance: inadequate. Patient has been taking only 1 Metformin a day instead of 2., diabetic diet compliance: noncompliant some of the time, further diabetic ROS: no polyuria or polydipsia, no chest pain, dyspnea or TIA's, no numbness, tingling or pain in extremities, no unusual visual symptoms, no hypoglycemia.  Last A1C was 6.3 in March.   Hypertension Patient here for follow-up of controled arterial hypertension. Blood pressure is well controlled at home. He/She is not exercising and is adherent to a low-salt diet. She currently takes Lisinopril-Hydrochlorothiazide and is adherent to regimen.  Cardiac symptoms: none. Patient denies: chest pain, chest pressure/discomfort, claudication, dyspnea, irregular heart beat, palpitations and tachypnea. Cardiovascular risk factors: diabetes mellitus, hypertension and obesity (BMI >= 30 kg/m2). Use of agents associated with hypertension: none. History of target organ damage: none.   Health  Maintenance Due  Topic Date Due  . PNEUMOCOCCAL POLYSACCHARIDE VACCINE (1) 04/04/1977  . FOOT EXAM  04/04/1985  . OPHTHALMOLOGY EXAM  04/04/1985  . URINE MICROALBUMIN  04/04/1985  . HIV Screening  04/04/1990  . TETANUS/TDAP  04/04/1994  . PAP SMEAR  04/04/1996  . INFLUENZA VACCINE  07/19/2015  . HEMOGLOBIN A1C  08/30/2015    -  reports that she has never smoked. She does not have any smokeless tobacco history on file. - Review of Systems: Per HPI. All other systems reviewed and are negative. - Past Medical History: Patient Active Problem List   Diagnosis Date Noted  . 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 Morbid obesity   . Chest pain with high risk of acute coronary syndrome 02/27/2015  . Cardiac mass 02/27/2015  . Chest pain 02/26/2015  . Headache 02/26/2015  . Elevated troponin I level 02/26/2015  . HTN (hypertension) 02/26/2015  . DM2 (diabetes mellitus, type 2) 02/26/2015  . 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 aspirin EC 81 MG tablet Take 81 mg by mouth daily.    Marland Kitchen HYDROcodone-acetaminophen (NORCO) 5-325 MG per tablet Take 1 tablet by mouth every 6 (six) hours as needed for severe pain. 6 tablet 0  . lisinopril-hydrochlorothiazide (PRINZIDE,ZESTORETIC) 20-25 MG per tablet Take 1 tablet by mouth daily. 30 tablet 11  . metFORMIN (GLUCOPHAGE) 500 MG tablet Take 500 mg by mouth 2 (two) times daily.     No current facility-administered medications for this visit.    SHx: Denies tobacco use, alcohol consumption, or illicit drug use  Objective:   Physical Exam BP 118/78 mmHg  Pulse 70  Temp(Src) 99.4 F (37.4 C) (Oral)  Ht 5\' 4"  (1.626 m)  Wt 89.268 kg (196 lb 12.8 oz)  BMI 33.76 kg/m2  LMP 08/14/2015 Gen: NAD, alert, cooperative with exam, well-appearing HEENT: NCAT, PERRL, clear conjunctiva,  oropharynx clear, supple neck CV: RRR, good S1/S2, no murmur, no edema, capillary refill brisk  Resp: CTABL, no wheezes, non-labored Abd: SNTND, BS present, no guarding or organomegaly Skin: no rashes, normal turgor  Neuro: no gross deficits.  Psych: good insight, alert and oriented     Assessment & Plan:  DM2 (diabetes mellitus, type 2) Last Hgb A1C was 6.3 on 02/27/15. Will need to recheck A1C and follow up in 1 month for a more thorough diabetes check up. Continue Metformin 500mg  BID.   HTN (hypertension) Controlled. Patient will follow up in 1 month. Lisinopril-hydrochlorothiazide refill prescription sent to pharmacy.   Headache Not suspicious for migraine, cluster, or tension headache. Also not suspicious for something more serious like subarachnoid hemorrhage. Mostly likely secondary to dehydration. Patient will try taking extra strength Tylenol and drink plenty of water. Will call clinic if headache persists.

## 2015-09-06 NOTE — Assessment & Plan Note (Addendum)
Last Hgb A1C was 6.3 on 02/27/15. Will need to recheck A1C and follow up in 1 month for a more thorough diabetes check up. Continue Metformin 500mg  BID.

## 2015-09-06 NOTE — Assessment & Plan Note (Addendum)
Controlled. Patient will follow up in 1 month. Lisinopril-hydrochlorothiazide refill prescription sent to pharmacy.

## 2015-09-08 NOTE — Assessment & Plan Note (Signed)
Not suspicious for migraine, cluster, or tension headache. Also not suspicious for something more serious like subarachnoid hemorrhage. Mostly likely secondary to dehydration. Patient will try taking extra strength Tylenol and drink plenty of water. Will call clinic if headache persists.

## 2015-10-01 ENCOUNTER — Other Ambulatory Visit: Payer: Self-pay

## 2015-10-01 ENCOUNTER — Emergency Department (HOSPITAL_COMMUNITY): Payer: 59

## 2015-10-01 ENCOUNTER — Encounter (HOSPITAL_COMMUNITY): Payer: Self-pay

## 2015-10-01 ENCOUNTER — Emergency Department (HOSPITAL_COMMUNITY)
Admission: EM | Admit: 2015-10-01 | Discharge: 2015-10-01 | Disposition: A | Discharge status: Home / Self Care | Payer: 59 | Attending: Emergency Medicine | Admitting: Emergency Medicine

## 2015-10-01 DIAGNOSIS — Z79899 Other long term (current) drug therapy: Secondary | ICD-10-CM | POA: Insufficient documentation

## 2015-10-01 DIAGNOSIS — E119 Type 2 diabetes mellitus without complications: Secondary | ICD-10-CM | POA: Insufficient documentation

## 2015-10-01 DIAGNOSIS — I10 Essential (primary) hypertension: Secondary | ICD-10-CM | POA: Diagnosis not present

## 2015-10-01 DIAGNOSIS — S20219A Contusion of unspecified front wall of thorax, initial encounter: Secondary | ICD-10-CM | POA: Diagnosis not present

## 2015-10-01 DIAGNOSIS — Y9389 Activity, other specified: Secondary | ICD-10-CM | POA: Insufficient documentation

## 2015-10-01 DIAGNOSIS — Y92410 Unspecified street and highway as the place of occurrence of the external cause: Secondary | ICD-10-CM | POA: Diagnosis not present

## 2015-10-01 DIAGNOSIS — Y999 Unspecified external cause status: Secondary | ICD-10-CM | POA: Insufficient documentation

## 2015-10-01 DIAGNOSIS — S299XXA Unspecified injury of thorax, initial encounter: Secondary | ICD-10-CM | POA: Diagnosis present

## 2015-10-01 DIAGNOSIS — Z9889 Other specified postprocedural states: Secondary | ICD-10-CM | POA: Diagnosis not present

## 2015-10-01 MED ORDER — KETOROLAC TROMETHAMINE 60 MG/2ML IM SOLN
60.0000 mg | Freq: Once | INTRAMUSCULAR | Status: AC
  Administered 2015-10-01: 60 mg via INTRAMUSCULAR
  Filled 2015-10-01: qty 2

## 2015-10-01 NOTE — Discharge Instructions (Signed)
Contusin en el trax  (Chest Contusion)  Una contusin en el trax es un hematoma profundo en esa zona. Las contusiones son el resultado de una lesin que causa sangrado debajo de la piel. Puede causar un hematoma en la piel, los msculos o las costillas. La zona de la contusin puede ponerse [place], [place] o [place]. Las lesiones menores no causan Social research officer, government, Armed forces training and education officer las ms graves pueden presentar dolor e inflamacin durante un par de semanas. CAUSAS  La causa de la contusin generalmente es un golpe, un traumatismo o una fuerza directa ejercida sobre una zona del cuerpo.  SNTOMAS   Hinchazn y enrojecimiento en la zona lesionada.  Cambios de coloracin de la piel en esa zona.  Sensibilidad y Management consultant.  Dolor. DIAGNSTICO  El diagnstico puede hacerse realizando una historia clnica y un examen fsico. Podra ser necesario tomar una radiografa, tomografa computada (TC) o una resonancia magntica (RMN) para determinar si hubo lesiones asociadas, como por ejemplo huesos rotos (fracturas) o lesiones internas.  TRATAMIENTO  El mejor tratamiento para la contusin en el trax es el reposo, la aplicacin de hielo y compresas fras en la zona de la lesin. Podrn indicarle ejercicios de respiracin profunda para reducir el riesgo de neumona. Para calmar el dolor tambin podrn indicarle medicamentos de venta libre.  INSTRUCCIONES PARA EL CUIDADO EN EL HOGAR   Aplique hielo sobre la zona lesionada.  Ponga el hielo en una bolsa plstica.  Colquese una toalla entre la piel y la bolsa de hielo.  Deje el hielo durante 15 a 20 minutos, 3 a 4 veces por da.  Tome slo medicamentos de venta libre o recetados, segn las indicaciones del mdico. El mdico podr indicarle que evite tomar antiinflamatorios (aspirina, ibuprofeno y naproxeno) durante 60 horas ya que estos medicamentos pueden aumentar los hematomas.  Haga que la zona lesionada repose.  Haga ejercicios de respiracin profunda segn  las indicaciones de su mdico.  Si fuma, abandone el hbito.  No levante objetos ms pesados que 5 libras (2.3 kg.) durante 3 das o ms, si se lo indican. SOLICITE ATENCIN MDICA DE INMEDIATO SI:   El hematoma o la hinchazn aumentan.  Siente dolor que [place].  Tiene dificultad para respirar.  Se siente mareado, dbil o se desmaya.  Observa sangre en la orina.  Tose o vomita sangre.  La hinchazn o el dolor no se [employer]. ASEGRESE DE QUE:   Comprende estas instrucciones.  Controlar su enfermedad.  Solicitar ayuda de inmediato si no mejora o si empeora.   Esta informacin no tiene Marine scientist el consejo del mdico. Asegrese de hacerle al mdico cualquier pregunta que tenga.   Document Released: 09/13/2005 Document Revised: 08/28/2012 Elsevier Interactive Patient Education [employer].

## 2015-10-01 NOTE — ED Notes (Signed)
Patient transported to CT 

## 2015-10-01 NOTE — ED Notes (Signed)
Pt ambulated to bathroom 
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
## 2015-10-04 ENCOUNTER — Encounter: Payer: Self-pay | Admitting: Family Medicine

## 2015-10-04 ENCOUNTER — Ambulatory Visit (INDEPENDENT_AMBULATORY_CARE_PROVIDER_SITE_OTHER): Payer: 59 | Admitting: Family Medicine

## 2015-10-04 VITALS — BP 111/67 | HR 70 | Temp 98.7°F | Ht 64.0 in | Wt 201.1 lb

## 2015-10-04 DIAGNOSIS — R079 Chest pain, unspecified: Secondary | ICD-10-CM | POA: Diagnosis not present
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
## 2015-10-11 ENCOUNTER — Ambulatory Visit: Payer: 59 | Admitting: Family Medicine

## 2015-11-29 ENCOUNTER — Emergency Department (HOSPITAL_COMMUNITY)
Admission: EM | Admit: 2015-11-29 | Discharge: 2015-11-30 | Disposition: A | Discharge status: Home / Self Care | Payer: 59 | Attending: Emergency Medicine | Admitting: Emergency Medicine

## 2015-11-29 ENCOUNTER — Emergency Department (HOSPITAL_COMMUNITY): Payer: 59

## 2015-11-29 ENCOUNTER — Encounter (HOSPITAL_COMMUNITY): Payer: Self-pay | Admitting: Family Medicine

## 2015-11-29 DIAGNOSIS — E119 Type 2 diabetes mellitus without complications: Secondary | ICD-10-CM | POA: Diagnosis not present

## 2015-11-29 DIAGNOSIS — Z9889 Other specified postprocedural states: Secondary | ICD-10-CM | POA: Diagnosis not present

## 2015-11-29 DIAGNOSIS — R0602 Shortness of breath: Secondary | ICD-10-CM | POA: Insufficient documentation

## 2015-11-29 DIAGNOSIS — Z86018 Personal history of other benign neoplasm: Secondary | ICD-10-CM | POA: Insufficient documentation

## 2015-11-29 DIAGNOSIS — R079 Chest pain, unspecified: Secondary | ICD-10-CM | POA: Insufficient documentation

## 2015-11-29 DIAGNOSIS — I10 Essential (primary) hypertension: Secondary | ICD-10-CM | POA: Insufficient documentation

## 2015-11-29 DIAGNOSIS — Z79899 Other long term (current) drug therapy: Secondary | ICD-10-CM | POA: Diagnosis not present

## 2015-11-29 LAB — BASIC METABOLIC PANEL
Anion gap: 11 (ref 5–15)
BUN: 10 mg/dL (ref 6–20)
CO2: 23 mmol/L (ref 22–32)
Calcium: 9.2 mg/dL (ref 8.9–10.3)
Chloride: 103 mmol/L (ref 101–111)
Creatinine, Ser: 0.71 mg/dL (ref 0.44–1.00)
GFR calc Af Amer: >60 mL/min (ref >60)
GFR calc non Af Amer: >60 mL/min (ref >60)
Glucose, Bld: 130 mg/dL — ABNORMAL HIGH (ref 65–99)
Potassium: 3.1 mmol/L — ABNORMAL LOW (ref 3.5–5.1)
Sodium: 137 mmol/L (ref 135–145)

## 2015-11-29 LAB — I-STAT TROPONIN, ED: Troponin i, poc: 0 ng/mL (ref 0.00–0.08)

## 2015-11-29 LAB — CBC
HCT: 38 % (ref 36.0–46.0)
Hemoglobin: 11.8 g/dL — ABNORMAL LOW (ref 12.0–15.0)
MCH: 24 pg — ABNORMAL LOW (ref 26.0–34.0)
MCHC: 31.1 g/dL (ref 30.0–36.0)
MCV: 77.2 fL — ABNORMAL LOW (ref 78.0–100.0)
Platelets: 407 10*3/uL — ABNORMAL HIGH (ref 150–400)
RBC: 4.92 MIL/uL (ref 3.87–5.11)
RDW: 16.2 % — ABNORMAL HIGH (ref 11.5–15.5)
WBC: 11.3 10*3/uL — ABNORMAL HIGH (ref 4.0–10.5)

## 2015-11-29 LAB — TROPONIN I: Troponin I: <0.03 ng/mL (ref <0.031)

## 2015-11-29 NOTE — ED Notes (Signed)
Pt here with pressure in her chest and SOB. sts she was cooking when it started. Denies cough, fever.

## 2015-11-29 NOTE — ED Provider Notes (Signed)
CSN: [id number]     Arrival date & time 11/29/15  1311 History   First MD Initiated Contact with Patient 11/29/15 1908     Chief Complaint  Patient presents with  . Chest Pain  . 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 Patient states that she was cooking today when she began having chest pressure in the middle of her chest as well as some left arm numbness and some right thigh pain. She felt like she couldn't take a deep breath and had mild shortness of breath associated with the pain. She denies any cough/cold symptoms or fevers. She states that this feels similar to the symptoms she had before her myxoma resection. No history of blood clots, recent travel, OCP use, or leg swelling. Family history notable for mother with cardiac disease, unknown type.  Patient is a 39 y.o. female presenting with chest pain and shortness of breath. The history is provided by the patient.  Chest Pain Associated symptoms: shortness of breath   Shortness of Breath Associated symptoms: chest pain     Past Medical History  Diagnosis Date  . Hypertension   . Diabetes mellitus without complication ([place])   . Morbid obesity ([place])   . 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 Cesarean section  1999 and 2011    x 2  . Tubal ligation  2011  . Tee without cardioversion N/A 03/01/2015    Procedure: TRANSESOPHAGEAL ECHOCARDIOGRAM (TEE);  Surgeon: [name], MD;  Location: [facility] ENDOSCOPY;  Service: Cardiovascular;  Laterality: N/A;  . Left heart catheterization with coronary angiogram N/A 03/02/2015    Procedure: LEFT HEART CATHETERIZATION WITH CORONARY ANGIOGRAM;  Surgeon: [name], MD;  Location: [facility] CATH  LAB;  Service: Cardiovascular;  Laterality: N/A;  . Minimally invasive excision of atrial myxoma N/A 03/04/2015    Procedure: MINIMALLY INVASIVE RESECTION OF LEFT ATRIAL MYXOMA ( USING A BILAYER PATCH CLOSURE);  Surgeon: [name], MD;  Location: [place];  Service: Open Heart Surgery;  Laterality: N/A;  . Tee without cardioversion N/A 03/04/2015    Procedure: TRANSESOPHAGEAL ECHOCARDIOGRAM (TEE);  Surgeon: [name], MD;  Location: [place];  Service: Open Heart Surgery;  Laterality: N/A;   Family History  Problem Relation Age of Onset  . Hypertension Mother   . Diabetes Father    Social History  Substance Use Topics  . Smoking status: Never Smoker   . Smokeless tobacco: None  . Alcohol Use: Yes     Comment: occasional   OB History    No data available     Review of Systems  Respiratory: Positive for shortness of breath.   Cardiovascular: Positive for chest pain.    10 Systems reviewed and are negative for acute change except as noted in the HPI.   Allergies  Review of patient's allergies indicates no known allergies.  Home Medications   Prior to Admission medications   Medication Sig Start Date End Date Taking? Authorizing Provider  lisinopril-hydrochlorothiazide (PRINZIDE,ZESTORETIC) 20-25 MG per tablet Take 1 tablet by mouth daily. 09/06/15  Yes [name], MD  metFORMIN (GLUCOPHAGE) 500 MG tablet Take 500 mg by mouth 2 (two) times daily.   Yes Historical Provider, MD  HYDROcodone-acetaminophen (Standard) 5-325 MG per  tablet Take 1 tablet by mouth every 6 (six) hours as needed for severe pain. Patient not taking: Reported on 09/08/2015 07/08/15   Mercedes Camprubi-Soms, PA-C   BP 119/78 mmHg  Pulse 62  Temp(Src) 99 F (37.2 C) (Oral)  Resp 18  SpO2 100% Physical Exam  Constitutional: She is oriented to person, place, and time. She appears well-developed and well-nourished. No distress.  HENT:  Head: Normocephalic and atraumatic.  Moist mucous membranes   Eyes: Conjunctivae are normal. Pupils are equal, round, and reactive to light.  Neck: Neck supple.  Cardiovascular: Normal rate, regular rhythm and normal heart sounds.   No murmur heard. Pulmonary/Chest: Effort normal and breath sounds normal.  Abdominal: Soft. Bowel sounds are normal. She exhibits no distension. There is no tenderness.  Musculoskeletal: She exhibits no edema.  Neurological: She is alert and oriented to person, place, and time.  Fluent speech  Skin: Skin is warm and dry.  Psychiatric: She has a normal mood and affect. Judgment normal.  Nursing note and vitals reviewed.   ED Course  Procedures (including critical care time) Labs Review Labs Reviewed  BASIC METABOLIC PANEL - Abnormal; Notable for the following:    Potassium 3.1 (*)    Glucose, Bld 130 (*)    All other components within normal limits  CBC - Abnormal; Notable for the following:    WBC 11.3 (*)    Hemoglobin 11.8 (*)    MCV 77.2 (*)    MCH 24.0 (*)    RDW 16.2 (*)    Platelets 407 (*)    All other components within normal limits  TROPONIN I  I-STAT TROPOININ, ED    Imaging Review Dg Chest 2 View  11/29/2015  CLINICAL DATA:  Short of breath and chest pain EXAM: CHEST  2 VIEW COMPARISON:  10/01/2015 FINDINGS: Mild elevation right hemidiaphragm anteriorly is unchanged. Mild right lower lobe atelectasis. Negative for pneumonia. Negative for heart failure or effusion. IMPRESSION: No active cardiopulmonary disease. Electronically Signed   By: Franchot Gallo M.D.   On: 11/29/2015 13:55   I have personally reviewed and evaluated these lab results as part of my medical decision-making.   EKG Interpretation   Date/Time:  Monday November 29 2015 13:11:54 EST Ventricular Rate:  88 PR Interval:    QRS Duration: 94 QT Interval:  366 QTC Calculation: 442 R Axis:   26 Text Interpretation:  Accelerated Junctional rhythm Abnormal ECG No  significant change since last tracing Confirmed by Yostin Malacara MD,  Elmin Wiederholt  [phone]) on 11/29/2015 7:09:45 PM       EMERGENCY DEPARTMENT [place] CARDIAC EXAM "Study: Limited Ultrasound of the heart and pericardium"  INDICATIONS:Chest pain, history of atrial myxoma Multiple views of the heart and pericardium were obtained in real-time with a multi-frequency probe.  PERFORMED BY: Theotis Burrow  IMAGES ARCHIVED?: Yes  FINDINGS: no pericardial effusion, normal LV wall motion, no obvious atrial mass  LIMITATIONS:  Technically limited 2/2 body habitus, unable to obtain apical 4-chamber view  VIEWS USED: PS long, PS short, subxiphoid  INTERPRETATION: technically limited study, no obvious abnormalities  MDM   Final diagnoses:  Chest pain, unspecified chest pain type  SOB (shortness of breath)   Patient presents with episode of shortness of breath and chest pain that began at rest earlier today. No recent illness. On exam, the patient was well-appearing with normal vital signs. EKG unchanged from previous. No abnormal heart sounds. Chest x-ray unremarkable. Obtained above lab work including serial troponins.  Serial troponins  were negative. I performed a bedside ultrasound which was technically limited because of the patient's body habitus but I noted no pericardial effusion and no obvious masses. Patient is PERC negative therefore PE very unlikely. Given that the patient's surgery was in March, it would be extremely rare for her mass to have regenerated this quickly. However, I have recommended that she contact her cardiothoracic surgeon for follow-up appointments. I have extensively reviewed return precautions including any worsening symptoms. Patient voiced understanding and was discharged in satisfactory condition.   Sharlett Iles, MD 11/29/15 [phone]

## 2015-12-01 ENCOUNTER — Encounter: Payer: Self-pay | Admitting: Family Medicine

## 2016-02-02 ENCOUNTER — Ambulatory Visit (INDEPENDENT_AMBULATORY_CARE_PROVIDER_SITE_OTHER): Payer: BLUE CROSS/BLUE SHIELD | Admitting: Family Medicine

## 2016-02-02 ENCOUNTER — Encounter: Payer: Self-pay | Admitting: Family Medicine

## 2016-02-02 VITALS — BP 116/67 | HR 60 | Temp 98.2°F | Wt 200.7 lb

## 2016-02-02 DIAGNOSIS — N644 Mastodynia: Secondary | ICD-10-CM | POA: Diagnosis not present

## 2016-02-02 DIAGNOSIS — Z23 Encounter for immunization: Secondary | ICD-10-CM

## 2016-02-02 MED ORDER — MELOXICAM 15 MG PO TABS: 15.0000 mg | ORAL_TABLET | Freq: Every day | ORAL | Status: DC

## 2016-02-02 NOTE — Progress Notes (Signed)
Patient ID: Debra Diaz, female   DOB: 29-Aug-1976, 39 y.o.   MRN: [id number]   Subjective:  This history was provided by the patient via a Spanish language interprter.  Debra Diaz is a 40 y.o. 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 Patient presents to clinic complaining of right breast pain for approximately one and a half months.  Patient states breast is constant and not improving.  Breast is exceptionally painful to palpation movements that involve right breast and chest wall.  Patient also reports a small pea size lump on proximal right breast.    Patient has had an upper respiratory infection over past month and has a cough associated with that.  Coughing increases pain and preceded right breast pain.  Patient reports subjective fever, but is uncertain if that is associated with URI or right breast pain.  She denies rash, N/V/D and shortness of breath.  She denies discharge from breast, redness, heat and red streaks from area of pain.    Patient has used 400 mg of ibuprofen with some relief.  Patient concerned for cancer related to right breast pain.   Review of Systems:  Per HPI. All other systems reviewed and are negative.   PMH, PSH, Medications, Allergies, and FmHx reviewed and updated in EMR.  Social History: never smoker  Objective:  BP 116/67 mmHg  Pulse 60  Temp(Src) 98.2 F (36.8 C) (Oral)  Wt 200 lb 11.2 oz (91.037 kg)  LMP 01/22/2016 (Exact Date)  General: 40 y.o. female Awake, alert, well-nourished, NAD and non-toxic in appearance Cardio: RRR, S1S2 heard, no murmurs appreciated; +2 radial pulses bilaterally Pulm: Clear to auscultation bilaterally, no wheezes, rhonchi or rales GI: Soft, not tender, no distension,+BS x4 MSK: Normal gait and station, no arthralgias Skin: Dry, intact, no rashes or lesions.  No  lymphadenopathy noted to right breast or axillae.   - No edema, erythema or warmth noted to right breast. No nipple discharge noted, no focal area of abscess, cellulitis or fluctuance noted to right breast or chest wall. - No obvious deformity or mass noted right breast and it is similar in shape and size to left breast.   - There is a 1/2 cm nodule palpable in mid-upper right breast above areola that is mildly tender to palpation. - Patient is exceptionally tender with palpation of right nipple and areola and area just distal to areola.  No masses or deformities palpated in lower aspect of breast.   - Patient is also exceptionally tender to palpation of area just lateral to right breast near well-healed surgical scars from surgery last March.  No edema or erythema noted in area. - Surgical scar above right breast is not tender to palpation and is well-healed  Assessment & Plan:   Debra Diaz is a 40 y.o. female here for right breast pain.  Patient is concerned for breast cancer and states area is also very painful.  Patient is well appearing and no concern for systemic infection at this point.  While breast pain is exacerbated by movement, it seems to be localized to right breast tissue versus chest wall.    - Ultra sound imaging and mammography warranted at this point to further explore patient's pain. Will call patient with results. - Will prescribe Mobic for pain control. Patient may also use Tylenol for pain control.  Advised to discontinue  using ibuprofen.  1. Breast pain, right - US BREAST LTD UNI RIGHT INC AXILLA; Future - MM DIAG BREAST TOMO BILATERAL; Future    Sherron Ales, NP [facility] 02/02/2016 11:09 AM  Reviewed the note above and agree, changes made as appropriate. Will get diagnostic mammo and ultrasound to evaluate for possible structural etiology; of note she has several surgical scars from the right breast for a cardiac surgery (myxoma removal).     Tawanna Sat, MD 02/05/2016, 11:35 AM PGY-3, [place]

## 2016-02-02 NOTE — Patient Instructions (Signed)
Meloxicam 15mg  -- take this ONCE daily.  DO NOT TAKE WITH IBUPROFEN  Can also take acetaminophen (tylenol) 500-650mg  every 6 hours as needed. Can take max dose of 1000mg  at a time (but don't do more than every 8 hours, and not long term).  Schedule a follow up appointment after the ultrasound to discuss results

## 2016-02-07 ENCOUNTER — Ambulatory Visit
Admission: RE | Admit: 2016-02-07 | Discharge: 2016-02-07 | Disposition: A | Discharge status: Home / Self Care | Payer: BLUE CROSS/BLUE SHIELD | Source: Ambulatory Visit | Attending: Family Medicine | Admitting: Family Medicine

## 2016-02-07 DIAGNOSIS — N644 Mastodynia: Secondary | ICD-10-CM

## 2016-02-28 ENCOUNTER — Ambulatory Visit: Payer: BLUE CROSS/BLUE SHIELD | Admitting: Thoracic Surgery (Cardiothoracic Vascular Surgery)

## 2016-04-22 ENCOUNTER — Emergency Department (HOSPITAL_COMMUNITY): Payer: BLUE CROSS/BLUE SHIELD

## 2016-04-22 ENCOUNTER — Emergency Department (HOSPITAL_COMMUNITY)
Admission: EM | Admit: 2016-04-22 | Discharge: 2016-04-22 | Disposition: A | Discharge status: Home / Self Care | Payer: BLUE CROSS/BLUE SHIELD | Attending: Emergency Medicine | Admitting: Emergency Medicine

## 2016-04-22 ENCOUNTER — Encounter (HOSPITAL_COMMUNITY): Payer: Self-pay | Admitting: *Deleted

## 2016-04-22 DIAGNOSIS — S20211A Contusion of right front wall of thorax, initial encounter: Secondary | ICD-10-CM | POA: Insufficient documentation

## 2016-04-22 DIAGNOSIS — Y92410 Unspecified street and highway as the place of occurrence of the external cause: Secondary | ICD-10-CM | POA: Diagnosis not present

## 2016-04-22 DIAGNOSIS — Z86018 Personal history of other benign neoplasm: Secondary | ICD-10-CM | POA: Diagnosis not present

## 2016-04-22 DIAGNOSIS — Y998 Other external cause status: Secondary | ICD-10-CM | POA: Diagnosis not present

## 2016-04-22 DIAGNOSIS — Z862 Personal history of diseases of the blood and blood-forming organs and certain disorders involving the immune mechanism: Secondary | ICD-10-CM | POA: Insufficient documentation

## 2016-04-22 DIAGNOSIS — E119 Type 2 diabetes mellitus without complications: Secondary | ICD-10-CM | POA: Insufficient documentation

## 2016-04-22 DIAGNOSIS — Z9889 Other specified postprocedural states: Secondary | ICD-10-CM | POA: Insufficient documentation

## 2016-04-22 DIAGNOSIS — Y9389 Activity, other specified: Secondary | ICD-10-CM | POA: Diagnosis not present

## 2016-04-22 DIAGNOSIS — I10 Essential (primary) hypertension: Secondary | ICD-10-CM | POA: Insufficient documentation

## 2016-04-22 DIAGNOSIS — Z7984 Long term (current) use of oral hypoglycemic drugs: Secondary | ICD-10-CM | POA: Diagnosis not present

## 2016-04-22 DIAGNOSIS — Z79899 Other long term (current) drug therapy: Secondary | ICD-10-CM | POA: Insufficient documentation

## 2016-04-22 DIAGNOSIS — S299XXA Unspecified injury of thorax, initial encounter: Secondary | ICD-10-CM | POA: Diagnosis present

## 2016-04-22 HISTORY — DX: Anemia, unspecified: D64.9

## 2016-04-22 LAB — COMPREHENSIVE METABOLIC PANEL
ALT: 24 U/L (ref 14–54)
AST: 30 U/L (ref 15–41)
Albumin: 3.6 g/dL (ref 3.5–5.0)
Alkaline Phosphatase: 73 U/L (ref 38–126)
Anion gap: 11 (ref 5–15)
BUN: 10 mg/dL (ref 6–20)
CO2: 21 mmol/L — ABNORMAL LOW (ref 22–32)
Calcium: 9 mg/dL (ref 8.9–10.3)
Chloride: 105 mmol/L (ref 101–111)
Creatinine, Ser: 0.59 mg/dL (ref 0.44–1.00)
GFR calc Af Amer: >60 mL/min (ref >60)
GFR calc non Af Amer: >60 mL/min (ref >60)
Glucose, Bld: 148 mg/dL — ABNORMAL HIGH (ref 65–99)
Potassium: 3.8 mmol/L (ref 3.5–5.1)
Sodium: 137 mmol/L (ref 135–145)
Total Bilirubin: 0.5 mg/dL (ref 0.3–1.2)
Total Protein: 6.8 g/dL (ref 6.5–8.1)

## 2016-04-22 LAB — CBC WITH DIFFERENTIAL/PLATELET
Basophils Absolute: 0 10*3/uL (ref 0.0–0.1)
Basophils Relative: 0 %
Eosinophils Absolute: 0.1 10*3/uL (ref 0.0–0.7)
Eosinophils Relative: 1 %
HCT: 37.7 % (ref 36.0–46.0)
Hemoglobin: 11.4 g/dL — ABNORMAL LOW (ref 12.0–15.0)
Lymphocytes Relative: 19 %
Lymphs Abs: 2.2 10*3/uL (ref 0.7–4.0)
MCH: 22.8 pg — ABNORMAL LOW (ref 26.0–34.0)
MCHC: 30.2 g/dL (ref 30.0–36.0)
MCV: 75.2 fL — ABNORMAL LOW (ref 78.0–100.0)
Monocytes Absolute: 0.7 10*3/uL (ref 0.1–1.0)
Monocytes Relative: 6 %
Neutro Abs: 8.6 10*3/uL — ABNORMAL HIGH (ref 1.7–7.7)
Neutrophils Relative %: 74 %
Platelets: 262 10*3/uL (ref 150–400)
RBC: 5.01 MIL/uL (ref 3.87–5.11)
RDW: 16.9 % — ABNORMAL HIGH (ref 11.5–15.5)
WBC: 11.6 10*3/uL — ABNORMAL HIGH (ref 4.0–10.5)

## 2016-04-22 LAB — I-STAT TROPONIN, ED: Troponin i, poc: 0.01 ng/mL (ref 0.00–0.08)

## 2016-04-22 LAB — I-STAT BETA HCG BLOOD, ED (MC, WL, AP ONLY): I-stat hCG, quantitative: <5 m[IU]/mL (ref <5)

## 2016-04-22 MED ORDER — OXYCODONE-ACETAMINOPHEN 5-325 MG PO TABS: 1.0000 | ORAL_TABLET | ORAL | Status: DC | PRN

## 2016-04-22 MED ORDER — IOPAMIDOL (ISOVUE-300) INJECTION 61%
100.0000 mL | Freq: Once | INTRAVENOUS | Status: AC | PRN
  Administered 2016-04-22: 100 mL via INTRAVENOUS

## 2016-04-22 MED ORDER — HYDROMORPHONE HCL 1 MG/ML IJ SOLN
0.5000 mg | INTRAMUSCULAR | Status: DC | PRN
  Administered 2016-04-22 (×2): 0.5 mg via INTRAVENOUS
  Filled 2016-04-22 (×2): qty 1
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
## 2016-04-22 NOTE — ED Notes (Signed)
Wasted 2 vials (both with 0.5mg  and 0.5 mg of dilaudid) with Autumn Messing in the med room sharps container.  Pharmacy aware.  Pt was discharged and name was no longer in pixis.

## 2016-04-22 NOTE — ED Notes (Signed)
Pt demonstrated proper use of incentive spirometer.  O2 sats remained in upper 90's while ambulating, although R sided chest pain increased.  MD aware.
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
## 2016-04-24 ENCOUNTER — Encounter: Payer: Self-pay | Admitting: Thoracic Surgery (Cardiothoracic Vascular Surgery)

## 2016-04-24 ENCOUNTER — Ambulatory Visit (INDEPENDENT_AMBULATORY_CARE_PROVIDER_SITE_OTHER): Payer: BLUE CROSS/BLUE SHIELD | Admitting: Thoracic Surgery (Cardiothoracic Vascular Surgery)

## 2016-04-24 VITALS — BP 115/73 | HR 65 | Resp 20 | Ht 64.0 in | Wt 197.0 lb

## 2016-04-24 DIAGNOSIS — D151 Benign neoplasm of heart: Secondary | ICD-10-CM | POA: Diagnosis not present
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
## 2016-04-24 NOTE — Patient Instructions (Signed)
Make every effort to stay physically active, get some type of exercise on a regular basis, and stick to a "heart healthy diet".  The long term benefits for regular exercise and a healthy diet are critically important to your overall health and wellbeing.

## 2016-04-25 ENCOUNTER — Ambulatory Visit (INDEPENDENT_AMBULATORY_CARE_PROVIDER_SITE_OTHER): Payer: BLUE CROSS/BLUE SHIELD | Admitting: Family Medicine

## 2016-04-25 ENCOUNTER — Encounter: Payer: Self-pay | Admitting: Family Medicine

## 2016-04-25 DIAGNOSIS — R0789 Other chest pain: Secondary | ICD-10-CM

## 2016-04-25 DIAGNOSIS — M542 Cervicalgia: Secondary | ICD-10-CM

## 2016-04-25 MED ORDER — CYCLOBENZAPRINE HCL 10 MG PO TABS: 10.0000 mg | ORAL_TABLET | Freq: Three times a day (TID) | ORAL | Status: DC | PRN

## 2016-04-25 MED ORDER — IBUPROFEN 800 MG PO TABS: 800.0000 mg | ORAL_TABLET | Freq: Three times a day (TID) | ORAL | Status: DC | PRN
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
## 2016-04-27 ENCOUNTER — Telehealth: Payer: Self-pay

## 2016-04-27 NOTE — Telephone Encounter (Signed)
-----   Message from Leone Brand, MD sent at 04/26/2016  1:27 PM EDT ----- Can you print this note out and call the patient to let her know she can pick it up? Thanks.

## 2016-04-27 NOTE — Telephone Encounter (Signed)
Informed pt her visit note is waiting for at the front office. Page, cma.

## 2016-04-29 ENCOUNTER — Encounter (HOSPITAL_COMMUNITY): Payer: Self-pay

## 2016-04-29 ENCOUNTER — Emergency Department (HOSPITAL_COMMUNITY): Payer: BLUE CROSS/BLUE SHIELD

## 2016-04-29 ENCOUNTER — Emergency Department (HOSPITAL_COMMUNITY)
Admission: EM | Admit: 2016-04-29 | Discharge: 2016-04-30 | Disposition: A | Discharge status: Home / Self Care | Payer: BLUE CROSS/BLUE SHIELD | Attending: Emergency Medicine | Admitting: Emergency Medicine

## 2016-04-29 DIAGNOSIS — E119 Type 2 diabetes mellitus without complications: Secondary | ICD-10-CM | POA: Insufficient documentation

## 2016-04-29 DIAGNOSIS — D649 Anemia, unspecified: Secondary | ICD-10-CM | POA: Insufficient documentation

## 2016-04-29 DIAGNOSIS — Z9889 Other specified postprocedural states: Secondary | ICD-10-CM | POA: Diagnosis not present

## 2016-04-29 DIAGNOSIS — R109 Unspecified abdominal pain: Secondary | ICD-10-CM | POA: Insufficient documentation

## 2016-04-29 DIAGNOSIS — I10 Essential (primary) hypertension: Secondary | ICD-10-CM | POA: Insufficient documentation

## 2016-04-29 DIAGNOSIS — R0789 Other chest pain: Secondary | ICD-10-CM | POA: Diagnosis not present

## 2016-04-29 DIAGNOSIS — S060X0D Concussion without loss of consciousness, subsequent encounter: Secondary | ICD-10-CM | POA: Insufficient documentation

## 2016-04-29 DIAGNOSIS — Z86018 Personal history of other benign neoplasm: Secondary | ICD-10-CM | POA: Diagnosis not present

## 2016-04-29 DIAGNOSIS — R0602 Shortness of breath: Secondary | ICD-10-CM | POA: Diagnosis not present

## 2016-04-29 DIAGNOSIS — Z7984 Long term (current) use of oral hypoglycemic drugs: Secondary | ICD-10-CM | POA: Diagnosis not present

## 2016-04-29 DIAGNOSIS — Z79899 Other long term (current) drug therapy: Secondary | ICD-10-CM | POA: Diagnosis not present

## 2016-04-29 DIAGNOSIS — R079 Chest pain, unspecified: Secondary | ICD-10-CM | POA: Diagnosis present

## 2016-04-29 LAB — CBC
HCT: 38.5 % (ref 36.0–46.0)
Hemoglobin: 11.6 g/dL — ABNORMAL LOW (ref 12.0–15.0)
MCH: 23 pg — ABNORMAL LOW (ref 26.0–34.0)
MCHC: 30.1 g/dL (ref 30.0–36.0)
MCV: 76.2 fL — ABNORMAL LOW (ref 78.0–100.0)
Platelets: 367 10*3/uL (ref 150–400)
RBC: 5.05 MIL/uL (ref 3.87–5.11)
RDW: 17.1 % — ABNORMAL HIGH (ref 11.5–15.5)
WBC: 11.9 10*3/uL — ABNORMAL HIGH (ref 4.0–10.5)

## 2016-04-29 LAB — BASIC METABOLIC PANEL
Anion gap: 10 (ref 5–15)
BUN: 13 mg/dL (ref 6–20)
CO2: 23 mmol/L (ref 22–32)
Calcium: 9.2 mg/dL (ref 8.9–10.3)
Chloride: 105 mmol/L (ref 101–111)
Creatinine, Ser: 0.93 mg/dL (ref 0.44–1.00)
GFR calc Af Amer: >60 mL/min (ref >60)
GFR calc non Af Amer: >60 mL/min (ref >60)
Glucose, Bld: 129 mg/dL — ABNORMAL HIGH (ref 65–99)
Potassium: 4.1 mmol/L (ref 3.5–5.1)
Sodium: 138 mmol/L (ref 135–145)

## 2016-04-29 LAB — I-STAT TROPONIN, ED: Troponin i, poc: 0 ng/mL (ref 0.00–0.08)

## 2016-04-29 MED ORDER — PROCHLORPERAZINE EDISYLATE 5 MG/ML IJ SOLN
10.0000 mg | Freq: Once | INTRAMUSCULAR | Status: AC
  Administered 2016-04-29: 10 mg via INTRAVENOUS
  Filled 2016-04-29: qty 2

## 2016-04-29 MED ORDER — KETOROLAC TROMETHAMINE 30 MG/ML IJ SOLN
30.0000 mg | Freq: Once | INTRAMUSCULAR | Status: AC
  Administered 2016-04-29: 30 mg via INTRAVENOUS
  Filled 2016-04-29: qty 1

## 2016-04-29 MED ORDER — DIPHENHYDRAMINE HCL 50 MG/ML IJ SOLN
25.0000 mg | Freq: Once | INTRAMUSCULAR | Status: AC
  Administered 2016-04-29: 25 mg via INTRAVENOUS
  Filled 2016-04-29: qty 1
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
## 2016-04-29 NOTE — Discharge Instructions (Signed)
Take 4 over the counter ibuprofen tablets 3 times a day or 2 over-the-counter naproxen tablets twice a day for pain.   Dolor en la pared torcica (Chest Wall Pain) El dolor en la pared torcica se produce en los huesos y los msculos del pecho o alrededor de Orthoptist. A veces, una lesin Arts administrator. En ocasiones, la causa puede ser desconocida. Este dolor puede durar varias semanas. INSTRUCCIONES PARA EL CUIDADO EN EL HOGAR  Est atento a cualquier cambio en los sntomas. Tome estas medidas para Theatre stage manager dolor:   Haga reposo como se lo haya indicado el [place] actividades que causan dolor. Estas pueden ser [employer] requieren el uso de los msculos del trax, los abdominales o los laterales para levantar objetos pesados.   Si se lo indican, aplique hielo sobre la zona dolorida:  Ponga el hielo en una bolsa plstica.  Coloque una toalla entre la piel y la bolsa de hielo.  Coloque el hielo durante 48minutos, 2 a 3veces por Training and development officer.  Tome los medicamentos de venta libre y los recetados solamente como se lo haya indicado el mdico.  No consuma productos que contengan tabaco, incluidos cigarrillos, tabaco de Higher education careers adviser y Psychologist, sport and exercise. Si necesita ayuda para dejar de fumar, consulte al mdico.  Concurra a todas las visitas de control como se lo haya indicado el mdico. Esto es importante. SOLICITE ATENCIN MDICA SI:  Jaclynn Guarneri.  El dolor de [place].  Aparecen nuevos sntomas. SOLICITE ATENCIN MDICA DE INMEDIATO SI:  Tiene nuseas o vmitos.  Philbert Riser o tiene sensacin de desvanecimiento.  Tiene tos con flema (esputo) o expectora sangre al toser.  Le falta el aire.   Esta informacin no tiene Marine scientist el consejo del mdico. Asegrese de hacerle al mdico cualquier pregunta que tenga.   Document Released: 01/15/2007 Document Revised: 08/25/2015 Elsevier Interactive Patient Education [employer].

## 2016-04-29 NOTE — ED Notes (Signed)
Pt ambulated to and from restroom with no difficulty
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
## 2016-05-05 ENCOUNTER — Encounter: Payer: Self-pay | Admitting: Family Medicine

## 2016-05-05 ENCOUNTER — Ambulatory Visit (INDEPENDENT_AMBULATORY_CARE_PROVIDER_SITE_OTHER): Payer: BLUE CROSS/BLUE SHIELD | Admitting: Family Medicine

## 2016-05-05 VITALS — BP 133/70 | HR 54 | Temp 99.0°F | Ht 64.0 in | Wt 200.0 lb

## 2016-05-05 DIAGNOSIS — S060X9A Concussion with loss of consciousness of unspecified duration, initial encounter: Secondary | ICD-10-CM | POA: Insufficient documentation

## 2016-05-05 DIAGNOSIS — S060X0D Concussion without loss of consciousness, subsequent encounter: Secondary | ICD-10-CM | POA: Diagnosis not present

## 2016-05-05 DIAGNOSIS — S060XAA Concussion with loss of consciousness status unknown, initial encounter: Secondary | ICD-10-CM | POA: Insufficient documentation

## 2016-05-05 HISTORY — DX: Concussion with loss of consciousness status unknown, initial encounter: S06.0XAA

## 2016-05-05 NOTE — Assessment & Plan Note (Signed)
Symptoms consistent with postconcussive syndrome.  - Recommended physical and cognitive rest as well as good hydration and sleep - Continue ibuprofen and muscle relaxer as needed - Discontinue Percocet - Recommended heating pad and gentle stretching right trapezius and chest - f/u in 2 week if not improving or sooner if develops new or concerning symptoms
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
## 2016-05-05 NOTE — Patient Instructions (Addendum)
Su dolor de [place] y [place] son consistentes con la concusin. - Te animo a descansar fsica y mentalmente. Limite su tiempo viendo televisin y leyendo. Evite la actividad fsica extenuante durante la prxima semana. - Continuar con su ibuprofeno segn lo prescrito - Puede continuar con su relajante muscular segn lo prescrito. - Use una almohadilla de calefaccin en el cuello y el pecho y estire suavemente  Sndrome posconmocional (Post-Concussion Syndrome) El sndrome posconmocional se refiere a los sntomas que pueden ocurrir despus de una lesin en la cabeza. Estos sntomas pueden durar desde varias semanas hasta meses. [place] los medicamentos solamente como se lo haya indicado el mdico. No tome aspirina.  Duerma con la cabeza levantada para [employer] de [place].  Evite las actividades que puedan causarle otra lesin cerebral.  No practique deportes de contacto, como ftbol o ftbol americano, hockey o bsquet.  No practique otras actividades riesgosas, como esqu extremo, artes marciales o equitacin, hasta que el mdico se lo permita.  Concurra a todas las visitas de control como se lo haya indicado el mdico. Esto es importante. SOLICITE AYUDA SI:   Tiene dificultades para hacer lo siguiente:  Prestar atencin.  Concentrarse.  Recordar.  Aprender informacin nueva.  Sobrellevar el estrs.  Necesita ms tiempo para terminar las tareas.  Se perturba fcilmente (est irritable).  Tiene ms sntomas. Solicite ayuda si presenta cualquiera de estos sntomas durante ms de [employer] de la lesin:   Dolores de cabeza que perduran (crnicos).  Mareos.  Dificultad para mantener el equilibrio.  Ganas de vomitar (nuseas).  Problemas de visin.  Molestias por los ruidos o las luces.  Depresin.  Cambios en el estado de nimo.  Sensacin de preocupacin (ansiedad).  Poca tolerancia general.  Problemas de  memoria.  Dificultad para prestar atencin o concentrarse.  Problemas para dormir.  Cansancio permanente. SOLICITE AYUDA DE INMEDIATO SI:  Se siente confundido.  Se siente muy somnoliento.  Le cuesta despertarse.  Tiene Higher education careers adviser.  No deja de vomitar.  Tiene la sensacin de que se est moviendo mientras est quieto (vrtigo).  Tiene movimientos oculares muy rpidos de un lado al otro.  Empieza a temblar (convulsiones) o se desvanece (desmayo).  Le duele la cabeza y no mejora con medicamentos.  No puede mover los brazos o las piernas normalmente.  Uno de los centros negros de los ojos (pupilas) est ms grande que el otro.  Presenta una secrecin clara o con sangre que proviene de la nariz o de los odos.  Los sntomas Warehouse manager de Teacher, English as a foreign language. ASEGRESE DE QUE:  Comprende estas instrucciones.  Controlar su afeccin.  Recibir ayuda de inmediato si no mejora o si empeora.   Esta informacin no tiene Marine scientist el consejo del mdico. Asegrese de hacerle al mdico cualquier pregunta que tenga.   Document Released: 12/04/2005 Document Revised: 12/25/2014 Elsevier Interactive Patient Education [employer].

## 2016-05-16 ENCOUNTER — Other Ambulatory Visit: Payer: Self-pay | Admitting: Family Medicine

## 2016-05-23 ENCOUNTER — Ambulatory Visit (INDEPENDENT_AMBULATORY_CARE_PROVIDER_SITE_OTHER): Payer: Self-pay | Admitting: Family Medicine

## 2016-05-23 ENCOUNTER — Encounter: Payer: Self-pay | Admitting: Family Medicine

## 2016-05-23 VITALS — BP 123/85 | HR 63 | Temp 99.4°F | Ht 64.0 in | Wt 201.0 lb

## 2016-05-23 DIAGNOSIS — S060X0D Concussion without loss of consciousness, subsequent encounter: Secondary | ICD-10-CM

## 2016-05-23 DIAGNOSIS — R32 Unspecified urinary incontinence: Secondary | ICD-10-CM

## 2016-05-23 LAB — POCT URINALYSIS DIPSTICK
Bilirubin, UA: NEGATIVE
Glucose, UA: NEGATIVE
Ketones, UA: NEGATIVE
Leukocytes, UA: NEGATIVE
Nitrite, UA: NEGATIVE
Protein, UA: NEGATIVE
Spec Grav, UA: 1.025
Urobilinogen, UA: 1
pH, UA: 6

## 2016-05-23 LAB — POCT UA - MICROSCOPIC ONLY

## 2016-05-23 NOTE — Patient Instructions (Signed)
Thank you for coming in today, it was so nice to see you. Today we talked about your headache and urinary incontinence.   We will test your urine today, I will call you with the results if they are not normal.   Please go to the emergency room if you have change in vision, pass out, or can't walk straight.   Please follow up in 1-2 weeks. You can schedule this appointment at the front desk before you leave or call the clinic.  If you have any questions or concerns, please do not hesitate to call the office at [phone]. You can also message me directly via MyChart.   Sincerely,  Smitty Cords, MD  Dolor de cabeza general sin causa (General Headache Without Cause) El dolor de cabeza es un dolor o Tree surgeon que se siente en la zona de la cabeza o del cuello. Puede no tener una causa especfica. Hay muchas causas y tipos de dolores de [place]. Los dolores de cabeza ms comunes son los siguientes:  Cefalea tensional.  Cefaleas migraosas.  Cefalea en brotes.  Cefaleas diarias crnicas. INSTRUCCIONES PARA EL CUIDADO EN EL HOGAR  Controle su afeccin para ver si hay cambios. Siga estos pasos para Aeronautical engineer afeccin: Control del [employer] medicamentos de venta libre y los recetados solamente como se lo haya indicado el mdico.  Cuando sienta dolor de cabeza acustese en un cuarto oscuro y tranquilo.  Si se lo indican, aplique hielo sobre la cabeza y la zona del cuello:  Ponga el hielo en una bolsa plstica.  Coloque una toalla entre la piel y la bolsa de hielo.  Coloque el hielo durante 70minutos, 2 a 3veces por Training and development officer.  Utilice una almohadilla trmica o tome una ducha con agua caliente para aplicar calor en la cabeza y la zona del cuello como se lo haya indicado el [place] luces tenues si le [employer] luces brillantes o sus dolores de cabeza empeoran. Comida y bebida  Mantenga un horario para las comidas.  Limite el consumo de bebidas  alcohlicas.  Consuma menos cantidad de cafena o deje de tomarla. Instrucciones generales  Concurra a todas las visitas de control como se lo haya indicado el mdico. Esto es importante.  Lleve un diario de los dolores de cabeza para Neurosurgeon qu factores pueden desencadenarlos. Por ejemplo, escriba los siguientes datos:  Lo que usted come y Buyer, retail.  Cunto tiempo duerme.  Algn cambio en su dieta o en los medicamentos.  Pruebe algunas tcnicas de relajacin, como los [place].  Limite el estrs.  Sintese con la espalda recta y no tense los msculos.  No consuma productos que contengan tabaco, incluidos cigarrillos, tabaco de Higher education careers adviser o cigarrillos electrnicos. Si necesita ayuda para dejar de fumar, consulte al mdico.  Haga actividad fsica habitualmente como se lo haya indicado el mdico.  Tenga un horario fijo para dormir. Duerma entre 7 y 9horas o la cantidad de horas que le haya recomendado el mdico. SOLICITE ATENCIN MDICA SI:   Los medicamentos no Dealer los sntomas.  Tiene un dolor de cabeza que es diferente del dolor de cabeza habitual.  Tiene nuseas o vmitos.  Tiene fiebre. SOLICITE ATENCIN MDICA DE INMEDIATO SI:   El dolor se hace cada vez ms intenso.  Ha vomitado repetidas veces.  Presenta rigidez en el cuello.  Sufre prdida de la visin.  Tiene problemas para hablar.  Siente dolor en el ojo o en el odo.  Presenta debilidad  muscular o prdida del control muscular.  Pierde el equilibrio o tiene problemas para Writer.  Sufre mareos o se desmaya.  Se siente confundido.   Esta informacin no tiene Marine scientist el consejo del mdico. Asegrese de hacerle al mdico cualquier pregunta que tenga.   Document Released: 09/13/2005 Document Revised: 08/25/2015 Elsevier Interactive Patient Education [employer].
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
## 2016-06-06 ENCOUNTER — Ambulatory Visit (INDEPENDENT_AMBULATORY_CARE_PROVIDER_SITE_OTHER): Payer: Self-pay | Admitting: Family Medicine

## 2016-06-06 ENCOUNTER — Encounter: Payer: Self-pay | Admitting: Family Medicine

## 2016-06-06 VITALS — BP 125/76 | HR 70 | Temp 99.5°F | Ht 64.0 in | Wt 200.0 lb

## 2016-06-06 DIAGNOSIS — R51 Headache: Secondary | ICD-10-CM

## 2016-06-06 DIAGNOSIS — R519 Headache, unspecified: Secondary | ICD-10-CM

## 2016-06-06 DIAGNOSIS — E1121 Type 2 diabetes mellitus with diabetic nephropathy: Secondary | ICD-10-CM

## 2016-06-06 DIAGNOSIS — S060X0D Concussion without loss of consciousness, subsequent encounter: Secondary | ICD-10-CM

## 2016-06-06 LAB — POCT GLYCOSYLATED HEMOGLOBIN (HGB A1C): Hemoglobin A1C: 5.5
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
## 2016-06-06 NOTE — Assessment & Plan Note (Addendum)
Secondary to post-concussive syndrome See A&P, ordered CT Head wo contrast and Neurology referral, continue conservative management

## 2016-06-08 ENCOUNTER — Telehealth: Payer: Self-pay | Admitting: Family Medicine

## 2016-06-08 NOTE — Telephone Encounter (Signed)
Debra Diaz is calling from [facility] to have the orders for this patient faxed to her attention [phone] or [phone]. jw

## 2016-06-08 NOTE — Telephone Encounter (Signed)
Annamarie Dawley, CMA

## 2016-06-12 ENCOUNTER — Ambulatory Visit (INDEPENDENT_AMBULATORY_CARE_PROVIDER_SITE_OTHER): Payer: BLUE CROSS/BLUE SHIELD | Admitting: Family Medicine

## 2016-06-12 ENCOUNTER — Encounter: Payer: Self-pay | Admitting: Family Medicine

## 2016-06-12 VITALS — BP 107/61 | HR 62 | Temp 98.2°F | Ht 64.0 in | Wt 199.2 lb

## 2016-06-12 DIAGNOSIS — Z0000 Encounter for general adult medical examination without abnormal findings: Secondary | ICD-10-CM | POA: Insufficient documentation

## 2016-06-12 DIAGNOSIS — S060X0D Concussion without loss of consciousness, subsequent encounter: Secondary | ICD-10-CM

## 2016-06-12 NOTE — Assessment & Plan Note (Signed)
Patient will schedule appt in 1 month for pap smear
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
## 2016-06-12 NOTE — Patient Instructions (Signed)
Thank you for coming in today, it was so nice to see you. Today we talked about:   1. Concussion: We will wait to see what your CT of the head shows. I will call you with the results. I have written you a note for work to be out this week. Please continue to take Ibuprofen as needed for head pain.  2. Diabetes: Your A1C (measure of the sugar in blood) is very low which is a good thing. Please continue to take Metformin 500 mg once daily 3. Pap smear: We will need to do a pap smear at some point this year. We can do this at your next visit in 1 month if you would like.   Please follow up in 1 month. You can schedule this appointment at the front desk before you leave or call the clinic.  If you have any questions or concerns, please do not hesitate to call the office at [phone]. You can also message me directly via MyChart.   Sincerely,  Smitty Cords, MD
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
## 2016-06-13 ENCOUNTER — Ambulatory Visit (HOSPITAL_COMMUNITY)
Admission: RE | Admit: 2016-06-13 | Discharge: 2016-06-13 | Disposition: A | Discharge status: Home / Self Care | Payer: BLUE CROSS/BLUE SHIELD | Source: Ambulatory Visit | Attending: Family Medicine | Admitting: Family Medicine

## 2016-06-13 DIAGNOSIS — S060X0D Concussion without loss of consciousness, subsequent encounter: Secondary | ICD-10-CM | POA: Diagnosis not present

## 2016-06-13 DIAGNOSIS — R519 Headache, unspecified: Secondary | ICD-10-CM

## 2016-06-13 DIAGNOSIS — R51 Headache: Secondary | ICD-10-CM | POA: Diagnosis present

## 2016-06-14 ENCOUNTER — Ambulatory Visit (HOSPITAL_COMMUNITY): Payer: BLUE CROSS/BLUE SHIELD

## 2016-06-14 ENCOUNTER — Encounter: Payer: Self-pay | Admitting: Family Medicine

## 2016-06-14 NOTE — Telephone Encounter (Signed)
Called patient and discussed normal CT Head results. She was appreciative of the call. She will continue taking Ibuprofen as needed for headaches and follow up with the neurologis on 8/15. She also has follow up at our [facility] clinic on 7/27.

## 2016-07-13 ENCOUNTER — Ambulatory Visit: Payer: BLUE CROSS/BLUE SHIELD | Admitting: Family Medicine

## 2016-07-28 ENCOUNTER — Ambulatory Visit (HOSPITAL_COMMUNITY)
Admission: RE | Admit: 2016-07-28 | Discharge: 2016-07-28 | Disposition: A | Discharge status: Home / Self Care | Payer: BLUE CROSS/BLUE SHIELD | Source: Ambulatory Visit | Attending: Family Medicine | Admitting: Family Medicine

## 2016-07-28 ENCOUNTER — Encounter: Payer: Self-pay | Admitting: Family Medicine

## 2016-07-28 ENCOUNTER — Ambulatory Visit (INDEPENDENT_AMBULATORY_CARE_PROVIDER_SITE_OTHER): Payer: Self-pay | Admitting: Family Medicine

## 2016-07-28 VITALS — BP 111/73 | HR 66 | Temp 99.5°F | Ht 64.0 in | Wt 201.0 lb

## 2016-07-28 DIAGNOSIS — R079 Chest pain, unspecified: Secondary | ICD-10-CM | POA: Insufficient documentation

## 2016-07-28 MED ORDER — RANITIDINE HCL 150 MG PO TABS
150.0000 mg | ORAL_TABLET | Freq: Every day | ORAL | 2 refills | Status: DC
Start: 2016-07-28 — End: 2016-09-11
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
## 2016-07-28 NOTE — Patient Instructions (Addendum)
Thank you for coming in today, it was so nice to see you. Today we talked about:   Chest pain: your heart looks good. We will start a medicine for heartburn that may be causing your pain.   Please follow up in 1-3 months. You can schedule this appointment at the front desk before you leave or call the clinic.  If you have any questions or concerns, please do not hesitate to call the office at [phone]. You can also message me directly via MyChart.   Sincerely,  Smitty Cords, MD

## 2016-07-29 NOTE — Assessment & Plan Note (Addendum)
New nonexertional chest pain. Highly suspicious of anxiety vs panic attack and possibly GERD based on HPI. Other differentials include angina (reassurinagly EKG showed mild sinus brady at 58 BPM and no ST changes. CAD risk factors include HTN, T2DM, and obesity) vs MSK pain.  - Patient expressed interest in speaking with behavioral health consultant in regards to her anxiety. She is now scheduled to speak with someone. Appreciate Dr. Tod Persia assistance. Did not have patient fill out GAD7 and PHQ9 due to time constraints with ruling out cardiac etiology of chest pain in the office but can consider in the future. - Start Ranitidine for possible GERD symptoms - If chest pain continues and appears more cardiac in nature, may consider stress testing and possible cardiology referral - Follow up in 4-8 weeks

## 2016-08-01 ENCOUNTER — Encounter: Payer: Self-pay | Admitting: Neurology

## 2016-08-01 ENCOUNTER — Ambulatory Visit (INDEPENDENT_AMBULATORY_CARE_PROVIDER_SITE_OTHER): Payer: BLUE CROSS/BLUE SHIELD | Admitting: Neurology

## 2016-08-01 VITALS — BP 166/66 | HR 50 | Ht 64.0 in | Wt 203.0 lb

## 2016-08-01 DIAGNOSIS — I10 Essential (primary) hypertension: Secondary | ICD-10-CM | POA: Diagnosis not present

## 2016-08-01 DIAGNOSIS — G44329 Chronic post-traumatic headache, not intractable: Secondary | ICD-10-CM

## 2016-08-01 DIAGNOSIS — G4440 Drug-induced headache, not elsewhere classified, not intractable: Secondary | ICD-10-CM

## 2016-08-01 DIAGNOSIS — G4441 Drug-induced headache, not elsewhere classified, intractable: Secondary | ICD-10-CM

## 2016-08-01 DIAGNOSIS — M542 Cervicalgia: Secondary | ICD-10-CM | POA: Diagnosis not present
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
## 2016-08-01 NOTE — Patient Instructions (Addendum)
1.  Limit use of ibuprofen to no more than 2 days out of the week.  So, you should not take ibuprofen 5 days out of the week.  Taking too much ibuprofen (or any pain reliever) causes frequent headache 2.  Continue physical therapy 3.  Start taking the following vitamins and supplements, which can be bought over the counter:  Magnesium oxide 400mg  daily  Riboflavin 400mg  daily  Coenzyme Q10 100mg  three times daily  Turmeric 500mg  twice daily 4.  Contact us if 3 weeks.  If headaches not improved, we can prescribe a daily medication over the phone to help prevent the headaches. 5.  Follow up in 3 months.   1. Limite el uso de ibuprofeno a no ms de 2 das de [employer]. Por lo tanto, no debe tomar ibuprofeno 5 das de [employer]. Tomar demasiado ibuprofen (o cualquier analgsico) causa frecuente cefalea 2. Continuar la terapia fsica 3. Comience a tomar las siguientes vitaminas y suplementos, que se pueden comprar sin receta mdica: xido de magnesio 400mg  diario Riboflavina 400mg  diario Coenzima Q10 100 mg tres veces al da Crcuma 500mg  dos veces al da 4. Pngase en contacto con nosotros si 3 semanas. Si los dolores de cabeza no [place], podemos prescribir un medicamento diario por telfono para ayudar a [employer] [place]. 5. Seguimiento en 3 meses.

## 2016-08-14 ENCOUNTER — Telehealth: Payer: Self-pay | Admitting: Psychology

## 2016-08-14 ENCOUNTER — Ambulatory Visit: Payer: BLUE CROSS/BLUE SHIELD

## 2016-08-14 NOTE — Telephone Encounter (Signed)
Debra Diaz spoke with patient about no-show, patient had already called to reschedule her appointment for Thursday 31st at [facility].

## 2016-08-15 ENCOUNTER — Ambulatory Visit (INDEPENDENT_AMBULATORY_CARE_PROVIDER_SITE_OTHER): Payer: BLUE CROSS/BLUE SHIELD | Admitting: Psychology

## 2016-08-15 DIAGNOSIS — F411 Generalized anxiety disorder: Secondary | ICD-10-CM | POA: Insufficient documentation

## 2016-08-15 DIAGNOSIS — F419 Anxiety disorder, unspecified: Secondary | ICD-10-CM
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
## 2016-08-15 NOTE — Patient Instructions (Addendum)
Lee las hojas que le di sobre la respiracion profunda. Practica la respiracion profunda por lo menos dos veces al dia. La proxima cita conmigo Debra Diaz) va a ser el 12 de Septiembre a las 8:30. Voy a Company secretary al [name] la nota que le dio [name] (su neurologo).  Read the paper that I gave you about deep breathing. Practice deep breathing at least two times per day. The next appointment with me Debra Diaz) will be September 12 at 8:30. I will send a message to [name] about the note that [name] gave you.

## 2016-08-17 ENCOUNTER — Ambulatory Visit: Payer: BLUE CROSS/BLUE SHIELD

## 2016-08-23 ENCOUNTER — Telehealth: Payer: Self-pay | Admitting: Family Medicine

## 2016-08-23 NOTE — Telephone Encounter (Signed)
Received message that patient's neurologist had brought up with her that a previous chest CT was abnormal and to follow up with PCP. Reviewed the CT from Apr 22 2016 with Dr. Gwendlyn Deutscher. Patient has had benign pulmonary exam following the CT and is asymptomatic. No follow up needed. Called patient and informed her that she does not need any follow up.   Smitty Cords, MD [place], PGY-2

## 2016-08-29 ENCOUNTER — Encounter: Payer: Self-pay | Admitting: Family Medicine

## 2016-08-29 ENCOUNTER — Other Ambulatory Visit: Payer: Self-pay

## 2016-08-29 ENCOUNTER — Ambulatory Visit (HOSPITAL_COMMUNITY)
Admission: RE | Admit: 2016-08-29 | Discharge: 2016-08-29 | Disposition: A | Discharge status: Home / Self Care | Payer: BLUE CROSS/BLUE SHIELD | Source: Ambulatory Visit | Attending: Family Medicine | Admitting: Family Medicine

## 2016-08-29 ENCOUNTER — Ambulatory Visit (INDEPENDENT_AMBULATORY_CARE_PROVIDER_SITE_OTHER): Payer: BLUE CROSS/BLUE SHIELD | Admitting: Family Medicine

## 2016-08-29 ENCOUNTER — Ambulatory Visit (INDEPENDENT_AMBULATORY_CARE_PROVIDER_SITE_OTHER): Payer: BLUE CROSS/BLUE SHIELD | Admitting: Psychology

## 2016-08-29 VITALS — BP 113/63 | HR 57 | Temp 98.5°F | Wt 205.0 lb

## 2016-08-29 DIAGNOSIS — F419 Anxiety disorder, unspecified: Secondary | ICD-10-CM

## 2016-08-29 DIAGNOSIS — R079 Chest pain, unspecified: Secondary | ICD-10-CM | POA: Diagnosis not present

## 2016-08-29 DIAGNOSIS — F411 Generalized anxiety disorder: Secondary | ICD-10-CM

## 2016-08-29 DIAGNOSIS — I10 Essential (primary) hypertension: Secondary | ICD-10-CM | POA: Diagnosis not present

## 2016-08-29 DIAGNOSIS — E1121 Type 2 diabetes mellitus with diabetic nephropathy: Secondary | ICD-10-CM | POA: Diagnosis not present

## 2016-08-29 DIAGNOSIS — R9431 Abnormal electrocardiogram [ECG] [EKG]: Secondary | ICD-10-CM | POA: Insufficient documentation

## 2016-08-29 LAB — POCT GLYCOSYLATED HEMOGLOBIN (HGB A1C): Hemoglobin A1C: 5.7

## 2016-08-29 MED ORDER — LISINOPRIL-HYDROCHLOROTHIAZIDE 20-25 MG PO TABS: 1.0000 | ORAL_TABLET | Freq: Every day | ORAL | 11 refills | Status: DC

## 2016-08-29 MED ORDER — METFORMIN HCL 500 MG PO TABS
500.0000 mg | ORAL_TABLET | Freq: Every day | ORAL | 11 refills | Status: DC
Start: 2016-08-29 — End: 2017-09-06

## 2016-08-29 NOTE — Patient Instructions (Addendum)
Continue practicando la respiracion profunda por lo menos dos veces al dia.   Ponga atencion a los pensamientos. Cuando tiene miedo, note cuales son los pensamientos y trate de Engineer, civil (consulting).   La proxima cita con Consuella Lose a ser el 60 de Septiembre a las 9.

## 2016-08-29 NOTE — Patient Instructions (Signed)
We will refill your metformin and blood pressure pills today. They are both at goal.  We will get a CT of your chest to look at your heart and lungs.  Come back in a couple days.  Take care,  Dr Jerline Pain

## 2016-08-29 NOTE — Assessment & Plan Note (Addendum)
A1c at goal. Continue metformin. Patient should have lipid panel checked soon. Will defer this to PCP.

## 2016-08-29 NOTE — Progress Notes (Signed)
Reason for follow-up:  Anxiety   Issues discussed: [facility] and patient discussed continued symptomology and coping strategies. [facility] discussed relationship between thoughts/feelings/behaviors and discussed ways to change unhelpful thoughts. Patient also concerned with a high blood sugar reading
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
## 2016-08-29 NOTE — Assessment & Plan Note (Signed)
At goal today. Normal BMP in May. Continue lisinopril-HCTZ.

## 2016-09-01 ENCOUNTER — Ambulatory Visit (INDEPENDENT_AMBULATORY_CARE_PROVIDER_SITE_OTHER): Payer: Self-pay | Admitting: Family Medicine

## 2016-09-01 ENCOUNTER — Encounter (HOSPITAL_COMMUNITY): Payer: Self-pay

## 2016-09-01 ENCOUNTER — Ambulatory Visit (HOSPITAL_COMMUNITY)
Admission: RE | Admit: 2016-09-01 | Discharge: 2016-09-01 | Disposition: A | Discharge status: Home / Self Care | Payer: BLUE CROSS/BLUE SHIELD | Source: Ambulatory Visit | Attending: Family Medicine | Admitting: Family Medicine

## 2016-09-01 ENCOUNTER — Encounter: Payer: Self-pay | Admitting: Family Medicine

## 2016-09-01 VITALS — BP 110/70 | HR 68 | Temp 98.5°F | Ht 64.0 in | Wt 205.0 lb

## 2016-09-01 DIAGNOSIS — J984 Other disorders of lung: Secondary | ICD-10-CM | POA: Diagnosis not present

## 2016-09-01 DIAGNOSIS — R911 Solitary pulmonary nodule: Secondary | ICD-10-CM | POA: Diagnosis not present

## 2016-09-01 DIAGNOSIS — R079 Chest pain, unspecified: Secondary | ICD-10-CM | POA: Diagnosis present

## 2016-09-01 DIAGNOSIS — E669 Obesity, unspecified: Secondary | ICD-10-CM

## 2016-09-01 LAB — LIPID PANEL
Cholesterol: 157 mg/dL (ref 125–200)
HDL: 39 mg/dL — ABNORMAL LOW (ref >=46)
LDL Cholesterol: 76 mg/dL (ref <130)
Total CHOL/HDL Ratio: 4 Ratio (ref <=5.0)
Triglycerides: 211 mg/dL — ABNORMAL HIGH (ref <150)
VLDL: 42 mg/dL — ABNORMAL HIGH (ref <30)

## 2016-09-01 LAB — BASIC METABOLIC PANEL WITH GFR
BUN: 11 mg/dL (ref 7–25)
CO2: 28 mmol/L (ref 20–31)
Calcium: 9.1 mg/dL (ref 8.6–10.2)
Chloride: 101 mmol/L (ref 98–110)
Creat: 0.64 mg/dL (ref 0.50–1.10)
GFR, Est African American: >89 mL/min (ref >=60)
GFR, Est Non African American: >89 mL/min (ref >=60)
Glucose, Bld: 103 mg/dL — ABNORMAL HIGH (ref 65–99)
Potassium: 4.4 mmol/L (ref 3.5–5.3)
Sodium: 138 mmol/L (ref 135–146)

## 2016-09-01 LAB — CBC
HCT: 38.9 % (ref 35.0–45.0)
Hemoglobin: 12 g/dL (ref 11.7–15.5)
MCH: 24.2 pg — ABNORMAL LOW (ref 27.0–33.0)
MCHC: 30.8 g/dL — ABNORMAL LOW (ref 32.0–36.0)
MCV: 78.4 fL — ABNORMAL LOW (ref 80.0–100.0)
MPV: 10.4 fL (ref 7.5–12.5)
Platelets: 358 10*3/uL (ref 140–400)
RBC: 4.96 MIL/uL (ref 3.80–5.10)
RDW: 17.1 % — ABNORMAL HIGH (ref 11.0–15.0)
WBC: 11.4 10*3/uL — ABNORMAL HIGH (ref 3.8–10.8)

## 2016-09-01 LAB — TSH: TSH: 1.32 mIU/L

## 2016-09-01 LAB — POCT I-STAT CREATININE: Creatinine, Ser: 0.6 mg/dL (ref 0.44–1.00)

## 2016-09-01 MED ORDER — IOPAMIDOL (ISOVUE-300) INJECTION 61%
INTRAVENOUS | Status: AC
  Administered 2016-09-01: 75 mL
  Filled 2016-09-01: qty 75

## 2016-09-01 NOTE — Progress Notes (Signed)
   Subjective:  Debra Diaz is a 40 y.o. female who presents to the [facility] today with a chief complaint of chest pain and shortness of breath. History is provided by video Spanish Interpretor.   HPI:  Chest Pain / SOB Patient seen in clinic 3 days ago with chest pain and shortness of breath for around a week. Pain located in the middle of her chest with radiation to her back. Worse with deep inspirations and movement. She had a CT scan this morning which showed a single stable pulmonary nodule, but was otherwise unremarkable. Over the past few days, the chest pain has improved, though she still has significant shortness of breath.  No fevers, nausea, vomiting, or diaphoresis. No LE edema.   ROS: Per HPI  Objective:  Physical Exam: BP 110/70   Pulse 68   Temp 98.5 F (36.9 C) (Oral)   Ht 5\' 4"  (1.626 m)   Wt 205 lb (93 kg)   LMP 08/25/2016 Comment: Tubal  BMI 35.19 kg/m   Gen: NAD, resting comfortably CV: RRR with no murmurs appreciated. Chest pain not reproducible Pulm: NWOB, CTAB with no crackles, wheezes, or rhonchi MSK: no edema, cyanosis, or clubbing noted Skin: warm, dry Neuro: grossly normal, moves all extremities Psych: Normal affect and thought content  Assessment/Plan:  Chest pain CT chest without obvious cause for her chest pain and shortness of breath. She has several risk factors for CAD including T2DM, HTN, and obesity. We will check a lipid panel today. Doubt GERD as a cause given that she is on ranitidine. Doubt MSK pain as her pain is not reproducible, however this is possible. Patient has underlying anxiety which may be playing a role.  - Referral placed to cardiology for stress testing -    Deanta Mincey M. Jerline Pain, [place] Resident PGY-3 09/01/2016 4:13 PM

## 2016-09-01 NOTE — Assessment & Plan Note (Addendum)
CT chest without obvious cause for her chest pain and shortness of breath. She has several risk factors for CAD including T2DM, HTN, and obesity. We will check a lipid panel today. Doubt GERD as a cause given that she is on ranitidine. Doubt MSK pain as her pain is not reproducible, however this is possible. Patient has underlying anxiety which may be playing a role.  - Referral placed to cardiology for stress testing

## 2016-09-01 NOTE — Patient Instructions (Signed)
Your CT scan showed no significant changes. We will check blood work today and send you to the heart doctor.  Come back to see your regular doctor soon.   Take care,  Dr Jerline Pain

## 2016-09-08 ENCOUNTER — Encounter: Payer: Self-pay | Admitting: Cardiology

## 2016-09-08 ENCOUNTER — Telehealth: Payer: Self-pay | Admitting: Family Medicine

## 2016-09-08 ENCOUNTER — Other Ambulatory Visit: Payer: Self-pay | Admitting: Family Medicine

## 2016-09-08 NOTE — Telephone Encounter (Signed)
Lab results reviewed. Unremarkable except for persistent leukocytosis. On chart review it looks that this has been present since at least 2008 and I cannot see any further investigation into this. Patient should have at least a peripheral smear drawn with possible heme-onc referral. Patient should come in to discuss her course of treatment.  Algis Greenhouse. Jerline Pain, [place] Resident PGY-3 09/08/2016 4:55 PM
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
## 2016-09-11 ENCOUNTER — Encounter: Payer: Self-pay | Admitting: Cardiology

## 2016-09-11 ENCOUNTER — Ambulatory Visit (INDEPENDENT_AMBULATORY_CARE_PROVIDER_SITE_OTHER): Payer: BLUE CROSS/BLUE SHIELD | Admitting: Cardiology

## 2016-09-11 VITALS — BP 124/80 | HR 54 | Ht 64.0 in | Wt 205.4 lb

## 2016-09-11 DIAGNOSIS — R079 Chest pain, unspecified: Secondary | ICD-10-CM

## 2016-09-11 NOTE — Telephone Encounter (Signed)
Patient has an appointment tomorrow with integrated care and note made to schedule her an appt with PCP when she comes in for this appointment. Harlee Pursifull,CMA

## 2016-09-11 NOTE — Patient Instructions (Signed)
Medication Instructions:    Your physician recommends that you continue on your current medications as directed. Please refer to the Current Medication list given to you today.  --- If you need a refill on your cardiac medications before your next appointment, please call your pharmacy. ---  Labwork:  None ordered  Testing/Procedures: .Your physician has requested that you have a lexiscan myoview. For further information please visit [email or web address]. Please follow instruction sheet, as given.  Follow-Up:  Follow up to be determined based upon test results.  Thank you for choosing [facility]!!   Trinidad Curet, RN [phone]  Any Other Special Instructions Will Be Listed Below (If Applicable).  Electrocardiograma de Electronics engineer (Pharmacologic Stress Electrocardiogram) Un electrocardiograma de esfuerzo farmacolgico es un estudio del corazn (cardaco) que [place] imgenes de resonancia magntica nuclear para Radio broadcast assistant suministro de sangre que recibe el corazn. Este estudio tambin se puede llamar electrocardiografa de Electronics engineer. Farmacolgico significa que se [place] un medicamento para aumentar la frecuencia cardaca y la presin arterial.  Esta prueba de esfuerzo se realiza para Community education officer en donde el flujo sanguneo hacia el corazn es deficiente al determinar la magnitud de la enfermedad arterial coronaria (EAC). Algunas personas se ejercitan en la cinta caminadora, la cual aumenta naturalmente el flujo sanguneo al corazn. En el caso de las personas que no pueden ejercitarse en la cinta caminadora, se usan medicamentos. Estos medicamentos [place] y harn que aumente la frecuencia y la intensidad cardaca, como si estuviera realizando ejercicios.  Los estudios de esfuerzo farmacolgico pueden ayudar a Teacher, adult education lo siguiente:  La eficacia del suministro de sangre hacia el corazn durante la realizacin de una actividad intensa para que  puedan darle el alta.  La magnitud del bloqueo de la arteria coronaria provocado por la enfermedad arterial coronaria.  Su pronstico, en el caso de que haya sufrido un ataque cardaco.  La efectividad de los procedimientos cardacos realizados, como una angioplastia, que pueden aumentar la circulacin en las arterias coronarias.  Las causas del dolor o la presin en el pecho. INFORME A SU MDICO:  Cualquier alergia que tenga.  Todos los [employer] [place], incluyendo vitaminas, hierbas, gotas oftlmicas, cremas y medicamentos de venta libre.  Problemas previos que usted o los [employer] de su familia hayan tenido con el uso de anestsicos.  Enfermedades de [employer].  Cirugas previas.  Enfermedades patolgicas.  Posibilidad de embarazo, si corresponde.  Si est actualmente amamantando. RIESGOS Y COMPLICACIONES En general, se trata de un procedimiento seguro. Sin embargo, Games developer procedimiento, pueden surgir complicaciones. Las complicaciones posibles son:  Tree surgeon o presin en:  El pecho.  La mandbula o el cuello.  Entre los omplatos.  El dolor se extiende hacia el brazo izquierdo.  Dolor de [place].  Mareos o sensacin de desvanecimiento.  Falta de aire.  Latidos cardacos rpidos o irregulares.  Presin arterial baja.  Nuseas o vmitos.  Rubor.  Enrojecimiento que sube por el brazo y Social research officer, government leve durante la inyeccin del [place].  Ataque cardaco (poco frecuente). ANTES DEL PROCEDIMIENTO   Evite todo tipo de producto con cafena durante las 24horas previas al estudio o segn las indicaciones del mdico. Esto incluye caf, t (incluso el t descafeinado), gaseosas con cafena, chocolate, cacao y ciertos analgsicos.  Siga las instrucciones del mdico respecto de lo que debe comer y beber antes del [place].  Tome los medicamentos con agua en las horas habituales, segn las indicaciones, salvo que le indiquen lo contrario. Lyndal Pulley  las excepciones se incluyen las siguientes:  Si tiene diabetes, pregunte cmo debe administrarse la insulina o tomar los comprimidos. Es habitual que se ajusten las dosis de [employer] maana del [place].  Si toma betabloqueantes, es importante que hable con el mdico sobre estos medicamentos antes de la fecha del [place]. Los betabloqueantes pueden interferir en los [place]. En algunos casos, deben cambiar o suspender la dosis de [employer] 24horas o ms antes del estudio.  Si [place] un parche de nitroglicerina, posiblemente se lo deba quitar antes del estudio. Pregntele al mdico si debe quitarse el parche antes del [place].  Si sufre alguna enfermedad respiratoria y [place] un inhalador, Air cabin crew en el momento del [place].  Si es un paciente ambulatorio, tenga en cuenta que despus de Customer service manager fase de esfuerzo (ejercicio) del estudio, deber comer algo, por lo que recomendamos traer algn bocadillo.  No fume durante las 4horas previas al estudio o segn las indicaciones del mdico.  No se aplique lociones, polvos, cremas ni aceites en el pecho antes del estudio.  Use ropa y calzado cmodos. Comunquele al mdico si no pudo completar o cumplir con los preparativos para el estudio. PROCEDIMIENTO   Le colocarn varios parches (electrodos) en el pecho. Si es necesario, es posible que le afeiten pequeas zonas en el pecho para que los electrodos se Chief Financial Officer. Una vez que los electrodos estn colocados en el cuerpo, conectarn varios cables a los electrodos y controlarn su frecuencia cardaca.  Se le colocar una va intravenosa. Se administra un Biomedical engineer (istopo), que se puede administrar por va intravenosa u oral. Nuclear hace referencia a los varios tipos de istopos radiactivos; el istopo nuclear ilumina las arterias para que las imgenes nucleares sean claras. El istopo es absorbido por el organismo. Esto ocurre ante una baja exposicin a la  radiacin.  Se toma una imagen nuclear en reposo que muestre cmo funciona el corazn en reposo.  Se administra un medicamento a travs de una va intravenosa (IV).  Aproximadamente 1hora despus de la administracin de [employer] se genera una segunda imagen y se determina cmo funciona el corazn bajo esfuerzo.  Durante esta fase de esfuerzo, estar conectado a una mquina de Aeronautical engineer. Se controlar la presin arterial y los niveles de oxgeno. DESPUS DEL PROCEDIMIENTO   Despus del estudio, se le controlar la frecuencia cardaca y la presin arterial.  Puede retomar su rutina normal, incluidos la dieta, las actividades y [employer], a menos que su mdico le indique lo contrario.   Esta informacin no tiene Marine scientist el consejo del mdico. Asegrese de hacerle al mdico cualquier pregunta que tenga.   Document Released: 09/24/2013 Document Revised: 12/09/2013 Elsevier Interactive Patient Education [employer].

## 2016-09-12 ENCOUNTER — Ambulatory Visit: Payer: BLUE CROSS/BLUE SHIELD

## 2016-09-12 ENCOUNTER — Telehealth (HOSPITAL_COMMUNITY): Payer: Self-pay | Admitting: *Deleted

## 2016-09-12 NOTE — Telephone Encounter (Signed)
Attempted to call patient regarding upcoming appointment - busy x 2.   Debra Diaz Jacqueline  

## 2016-09-13 ENCOUNTER — Ambulatory Visit (HOSPITAL_COMMUNITY): Payer: BLUE CROSS/BLUE SHIELD | Attending: Cardiovascular Disease

## 2016-09-13 DIAGNOSIS — R079 Chest pain, unspecified: Secondary | ICD-10-CM | POA: Diagnosis not present

## 2016-09-13 LAB — MYOCARDIAL PERFUSION IMAGING
LV dias vol: 109 mL (ref 46–106)
LV sys vol: 45 mL
Peak HR: 109 {beats}/min
RATE: 0.43
Rest HR: 52 {beats}/min
SDS: 2
SRS: 6
SSS: 8
TID: 1.1

## 2016-09-13 MED ORDER — TECHNETIUM TC 99M TETROFOSMIN IV KIT
30.0000 | PACK | Freq: Once | INTRAVENOUS | Status: AC | PRN
  Administered 2016-09-13: 30 via INTRAVENOUS
  Filled 2016-09-13: qty 30

## 2016-09-13 MED ORDER — REGADENOSON 0.4 MG/5ML IV SOLN
0.4000 mg | Freq: Once | INTRAVENOUS | Status: AC
  Administered 2016-09-13: 0.4 mg via INTRAVENOUS

## 2016-09-13 MED ORDER — TECHNETIUM TC 99M TETROFOSMIN IV KIT
10.8000 | PACK | Freq: Once | INTRAVENOUS | Status: AC | PRN
  Administered 2016-09-13: 11 via INTRAVENOUS
  Filled 2016-09-13: qty 11

## 2016-09-13 NOTE — Progress Notes (Unsigned)
Interpreter Madilyn Hook provided by Language services here to interpret for patient

## 2016-09-19 ENCOUNTER — Telehealth: Payer: Self-pay

## 2016-09-19 NOTE — Telephone Encounter (Signed)
[facility] called patient to discuss whether she'd like to schedule another appt with integrated care (missed last appt). Patient would like to come in on 10/17 for appt with [facility] at [place]. Patient asked whether [facility] could explain her recent lab results. [facility] advised patient to call the front desk to schedule an appt with her PCP to discuss these results [facility] saw in missed appt notes that patient was supposed to schedule with PCP). Patient agreed to call front office.

## 2016-09-21 ENCOUNTER — Ambulatory Visit: Payer: BLUE CROSS/BLUE SHIELD | Admitting: Family Medicine

## 2016-10-03 ENCOUNTER — Ambulatory Visit (INDEPENDENT_AMBULATORY_CARE_PROVIDER_SITE_OTHER): Payer: BLUE CROSS/BLUE SHIELD | Admitting: Family Medicine

## 2016-10-03 ENCOUNTER — Ambulatory Visit (INDEPENDENT_AMBULATORY_CARE_PROVIDER_SITE_OTHER): Payer: BLUE CROSS/BLUE SHIELD | Admitting: Psychology

## 2016-10-03 ENCOUNTER — Other Ambulatory Visit: Payer: Self-pay | Admitting: Family Medicine

## 2016-10-03 ENCOUNTER — Encounter: Payer: Self-pay | Admitting: Family Medicine

## 2016-10-03 VITALS — BP 119/61 | HR 61 | Temp 99.3°F | Ht 64.0 in | Wt 205.6 lb

## 2016-10-03 DIAGNOSIS — D72829 Elevated white blood cell count, unspecified: Secondary | ICD-10-CM

## 2016-10-03 DIAGNOSIS — Z79899 Other long term (current) drug therapy: Secondary | ICD-10-CM

## 2016-10-03 DIAGNOSIS — F411 Generalized anxiety disorder: Secondary | ICD-10-CM

## 2016-10-03 DIAGNOSIS — R079 Chest pain, unspecified: Secondary | ICD-10-CM

## 2016-10-03 HISTORY — DX: Elevated white blood cell count, unspecified: D72.829

## 2016-10-03 MED ORDER — IBUPROFEN 800 MG PO TABS: ORAL_TABLET | ORAL | 0 refills | Status: DC

## 2016-10-03 NOTE — Assessment & Plan Note (Signed)
Will check CBC with diff and smear. Will refer to heme onc given persistent elevation.

## 2016-10-03 NOTE — Patient Instructions (Signed)
We will send you to a blood doctor and check extra blood work today.  Take the ibuprofen as needed for your chest pain.  Take care,  Dr Jerline Pain

## 2016-10-03 NOTE — Assessment & Plan Note (Signed)
Cardiac work up negative. Likely MSK pain vs underlying anxiety. Patient seeing behavioral health for anxiety. Prescribed ibuprofen as needed for chest pain. If not improving, would consider increasing therapy to PPI.

## 2016-10-03 NOTE — Progress Notes (Signed)
Reason for follow-up:  Anxiety  Issues discussed:  Two of patient's children were present during appointment. [facility] and patient discussed her current anxious behaviors. She continues to avoid driving when possible and states that she no longer leaves the house much, except for to take her kids to school and go grocery shopping. She no longer goes out to lunch with her friends when invited. Patient reports that this is due to a lack of money as well as her fear of driving. [facility] discussed the fear-avoidance cycle and ways for patient to work on breaking this cycle.
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
## 2016-10-03 NOTE — Assessment & Plan Note (Signed)
Assessment/Plan/Recommendations: Patient continues to avoid driving or endure short periods of driving with high distress. Patient expresses understanding of how her avoidance increases her anxiety, but finds it difficult to force herself to drive when other options exist (such as husband or son driving, getting rides). Patient is also feeling down due to spending a lot of time inactive at home, as she has yet to find work cleaning houses.   [facility] and patient made plan for patient to practice driving during the day while her children are at school. She will try to drive to a park one day during the week to go walking. Patient continues to use breathing strategies to relax and finds these effective. Her larger goal is to be able to drive with less anxiety so that she and her family can drive to [place] during the December holidays. Patient will return to see [facility] on 10/31

## 2016-10-03 NOTE — Patient Instructions (Addendum)
Maneja hasta un parque para caminar una vez a la semana. Invita a una amiga si quiere para acompanarle.   La proxima cita con Consuella Lose a ser el 57 de Octubre a las 9am.

## 2016-10-04 LAB — VITAMIN B12: Vitamin B-12: 421 pg/mL (ref 200–1100)

## 2016-10-04 LAB — CBC WITH DIFFERENTIAL/PLATELET
Basophils Absolute: 0 cells/uL (ref 0–200)
Basophils Relative: 0 %
Eosinophils Absolute: 232 cells/uL (ref 15–500)
Eosinophils Relative: 2 %
HCT: 40.5 % (ref 35.0–45.0)
Hemoglobin: 12.7 g/dL (ref 11.7–15.5)
Lymphocytes Relative: 29 %
Lymphs Abs: 3364 cells/uL (ref 850–3900)
MCH: 25.1 pg — ABNORMAL LOW (ref 27.0–33.0)
MCHC: 31.4 g/dL — ABNORMAL LOW (ref 32.0–36.0)
MCV: 80 fL (ref 80.0–100.0)
MPV: 10.8 fL (ref 7.5–12.5)
Monocytes Absolute: 812 cells/uL (ref 200–950)
Monocytes Relative: 7 %
Neutro Abs: 7192 cells/uL (ref 1500–7800)
Neutrophils Relative %: 62 %
Platelets: 333 10*3/uL (ref 140–400)
RBC: 5.06 MIL/uL (ref 3.80–5.10)
RDW: 17.6 % — ABNORMAL HIGH (ref 11.0–15.0)
WBC: 11.6 10*3/uL — ABNORMAL HIGH (ref 3.8–10.8)

## 2016-10-04 LAB — PATHOLOGIST SMEAR REVIEW

## 2016-10-04 LAB — MANUAL DIFFERENTIAL
Atypical Lymphocytes Manual: 0 %
Bands Manual: 0 % (ref 0–5)
Basophils Manual: 0 % (ref 0–1)
Blasts Manual: 0 %
Eosinophils Manual: 3 % (ref 0–5)
Lymphocytes Manual: 26 % (ref 12–46)
Metamyelocytes Manual: 0 %
Monocytes Manual: 8 % (ref 3–12)
Myelocytes Manual: 0 %
Neutrophils Manual: 63 % (ref 43–77)

## 2016-10-10 ENCOUNTER — Encounter: Payer: Self-pay | Admitting: Family Medicine

## 2016-10-11 ENCOUNTER — Encounter: Payer: Self-pay | Admitting: Hematology and Oncology

## 2016-10-11 ENCOUNTER — Telehealth: Payer: Self-pay | Admitting: Hematology and Oncology

## 2016-10-11 NOTE — Telephone Encounter (Signed)
With the assistance of an interpeter pt confirmed appt, verified address and insurance, mail pt letter, in basket referring provider appt date/time

## 2016-10-17 ENCOUNTER — Ambulatory Visit (INDEPENDENT_AMBULATORY_CARE_PROVIDER_SITE_OTHER): Payer: BLUE CROSS/BLUE SHIELD | Admitting: *Deleted

## 2016-10-17 ENCOUNTER — Ambulatory Visit (INDEPENDENT_AMBULATORY_CARE_PROVIDER_SITE_OTHER): Payer: BLUE CROSS/BLUE SHIELD | Admitting: Psychology

## 2016-10-17 DIAGNOSIS — Z5321 Procedure and treatment not carried out due to patient leaving prior to being seen by health care provider: Secondary | ICD-10-CM

## 2016-10-17 DIAGNOSIS — F411 Generalized anxiety disorder: Secondary | ICD-10-CM

## 2016-10-17 NOTE — Assessment & Plan Note (Signed)
Assessment/Plan/Recommendations: Patient reports a reduction in anxiety over the past few weeks. She acknowledges that this is largely due to her avoidance of leaving the house, but reports that this was necessary so that she could spend time studying for the citizenship exam, which she will take this week. Patient reports feeling worried about this exam, especially because her mind tends to go blank when she is nervous. [facility] discussed and practiced relaxation strategies with patient, including deep breathing and progressive muscle relaxation. Patient will practice progressive muscle relaxation over the next couple of weeks and return to see [facility] on 11/14 at 9am to continue working on her goal of decreasing anxiety about driving.

## 2016-10-17 NOTE — Progress Notes (Signed)
   Patient left clinic without being seen by nurse for blood pressure check.  Nurse went out to lobby twice to call patient back to nurse clinic.  Patient was not in lobby.  Patient was seen by [place] Karrie Doffing prior to nurse clinic.  Nurse also asked Sonia Baller if patient was seating in lobby, Sonia Baller checked the lobby but patient was not there.  Derl Barrow, RN
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
## 2016-10-31 ENCOUNTER — Ambulatory Visit: Payer: BLUE CROSS/BLUE SHIELD

## 2016-10-31 ENCOUNTER — Telehealth: Payer: Self-pay

## 2016-10-31 NOTE — Telephone Encounter (Signed)
[facility] called to check in as patient cancelled today's appointment. Left message letting patient know she can call back to reschedule if desired.

## 2016-11-01 ENCOUNTER — Ambulatory Visit: Payer: BLUE CROSS/BLUE SHIELD | Admitting: Neurology

## 2016-11-08 ENCOUNTER — Encounter: Payer: Self-pay | Admitting: Hematology and Oncology

## 2016-11-08 ENCOUNTER — Ambulatory Visit (HOSPITAL_BASED_OUTPATIENT_CLINIC_OR_DEPARTMENT_OTHER): Payer: BLUE CROSS/BLUE SHIELD | Admitting: Hematology and Oncology

## 2016-11-08 DIAGNOSIS — E119 Type 2 diabetes mellitus without complications: Secondary | ICD-10-CM | POA: Diagnosis not present

## 2016-11-08 DIAGNOSIS — D72829 Elevated white blood cell count, unspecified: Secondary | ICD-10-CM

## 2016-11-08 NOTE — Progress Notes (Signed)
[facility] NOTE  Patient Care Team: [name], MD as PCP - General (Family Medicine) Pixie Casino, MD as Consulting Physician (Cardiology)  CHIEF COMPLAINTS/PURPOSE OF CONSULTATION:  Leukocytosis  HISTORY OF PRESENTING ILLNESS:  Debra Diaz 40 y.o. female is here because of elevated white blood cell count. Patient has had chronic elevation of white blood cell count. Upon review of her blood work it appears that she has had elevation of white blood cells at least since 2008. They were primarily elevated neutrophils. Periodically the white count has even gone up to 18,000. Most recent blood work showed a white blood cell count of 11.6. The differential appeared to be fairly normal without any absolute increase in the neutrophils and lymphocytes. Previously they were primarily elevated neutrophil counts. She has also been experiencing increased fatigue lately.  I reviewed her records extensively and collaborated the history with the patient.  MEDICAL HISTORY:  Past Medical History:  Diagnosis Date  . Anemia   . Diabetes mellitus without complication ([place])   . Hypertension   . Morbid obesity ([place])   . 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 [place] and 2011   x 2  . LEFT HEART CATHETERIZATION WITH CORONARY ANGIOGRAM N/A 03/02/2015   Procedure: LEFT HEART CATHETERIZATION WITH CORONARY ANGIOGRAM;  Surgeon: [name], MD;  Location: [facility] CATH LAB;  Service: Cardiovascular;  Laterality: N/A;  . MINIMALLY INVASIVE EXCISION OF ATRIAL MYXOMA N/A 03/04/2015   Procedure: MINIMALLY INVASIVE RESECTION OF LEFT ATRIAL MYXOMA ( USING A BILAYER PATCH CLOSURE);  Surgeon: [name], MD;  Location: [place];  Service: Open Heart Surgery;  Laterality: N/A;  . TEE WITHOUT CARDIOVERSION N/A 03/01/2015   Procedure: TRANSESOPHAGEAL ECHOCARDIOGRAM  (TEE);  Surgeon: [name], MD;  Location: [facility] ENDOSCOPY;  Service: Cardiovascular;  Laterality: N/A;  . TEE WITHOUT CARDIOVERSION N/A 03/04/2015   Procedure: TRANSESOPHAGEAL ECHOCARDIOGRAM (TEE);  Surgeon: [name], MD;  Location: [place];  Service: Open Heart Surgery;  Laterality: N/A;  . TUBAL LIGATION  2011    SOCIAL HISTORY: Social History   Social History  . Marital status: Married    Spouse name: N/A  . Number of children: N/A  . Years of education: N/A   Occupational History  . Not on file.   Social History Main Topics  . Smoking status: Never Smoker  . Smokeless tobacco: Never Used  . Alcohol use Yes     Comment: occasional  . Drug use: No  . Sexual activity: Yes    Birth control/ protection: Surgical   Other Topics Concern  . Not on file   Social History Narrative  . No narrative on file    FAMILY HISTORY: Family History  Problem Relation Age of Onset  . Hypertension Mother   . Diabetes Father   . Asthma Son     ALLERGIES:  has No Known Allergies.  MEDICATIONS:  Current Outpatient Prescriptions  Medication Sig Dispense Refill  . cholecalciferol (VITAMIN D) 1000 units tablet Take 1,000 Units by mouth daily.    . cyclobenzaprine (FLEXERIL) 10 MG tablet Take 1 tablet (10 mg total) by mouth 3 (three) times daily as needed for muscle spasms. 30 tablet 0  . ibuprofen (ADVIL,MOTRIN) 800 MG tablet TAKE 1 TABLET(800 MG) BY MOUTH EVERY 8 HOURS AS NEEDED 30 tablet 0  . IRON PO Take 1  tablet by mouth daily.    Marland Kitchen lisinopril-hydrochlorothiazide (PRINZIDE,ZESTORETIC) 20-25 MG tablet TAKE 1 TABLET BY MOUTH EVERY DAY 30 tablet 0  . metFORMIN (GLUCOPHAGE) 500 MG tablet Take 1 tablet (500 mg total) by mouth daily with breakfast. 30 tablet 11   No current facility-administered medications for this visit.     REVIEW OF SYSTEMS:   Constitutional: Denies fevers, chills or abnormal night sweats, Severe fatigue Eyes: Denies blurriness of vision, double vision or  watery eyes Ears, nose, mouth, throat, and face: Denies mucositis or sore throat Respiratory: Denies cough, dyspnea or wheezes Cardiovascular: Denies palpitation, chest discomfort or lower extremity swelling Gastrointestinal:  Denies nausea, heartburn or change in bowel habits Skin: Denies abnormal skin rashes Lymphatics: Denies new lymphadenopathy or easy bruising Neurological:Denies numbness, tingling or new weaknesses Behavioral/Psych: Mood is stable, no new changes  Breast:  Denies any palpable lumps or discharge All other systems were reviewed with the patient and are negative.  PHYSICAL EXAMINATION: ECOG PERFORMANCE STATUS: 1 - Symptomatic but completely ambulatory  Vitals:   11/08/16 1248  BP: (!) 144/79  Pulse: 64  Resp: 20  Temp: 98.5 F (36.9 C)   Filed Weights   11/08/16 1248  Weight: 203 lb 4.8 oz (92.2 kg)    GENERAL:alert, no distress and comfortable SKIN: skin color, texture, turgor are normal, no rashes or significant lesions EYES: normal, conjunctiva are pink and non-injected, sclera clear OROPHARYNX:no exudate, no erythema and lips, buccal mucosa, and tongue normal  NECK: supple, thyroid normal size, non-tender, without nodularity LYMPH:  no palpable lymphadenopathy in the cervical, axillary or inguinal LUNGS: clear to auscultation and percussion with normal breathing effort HEART: regular rate & rhythm and no murmurs and no lower extremity edema ABDOMEN:abdomen soft, non-tender and normal bowel sounds Musculoskeletal:no cyanosis of digits and no clubbing  PSYCH: alert & oriented x 3 with fluent speech NEURO: no focal motor/sensory deficits  LABORATORY DATA:  I have reviewed the data as listed Lab Results  Component Value Date   WBC 11.6 (H) 10/03/2016   HGB 12.7 10/03/2016   HCT 40.5 10/03/2016   MCV 80.0 10/03/2016   PLT 333 10/03/2016   Lab Results  Component Value Date   NA 138 09/01/2016   K 4.4 09/01/2016   CL 101 09/01/2016   CO2 28  09/01/2016    RADIOGRAPHIC STUDIES: I have personally reviewed the radiological reports and agreed with the findings in the report.  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 I reviewed her peripheral smear and there are no abnormal cells. I discussed with her that the primary cause of leukocytosis is chronic inflammation. We discussed different sites of inflammation the patient might have. She has chronic arthritis as well as chronic dermatitis.  We went through the entire pathway of what constitutes elevated white blood cell count. We also discussed the different types of white blood cells.  Recommendations to decrease chronic inflammation: 1. I encouraged her to lose weight as this would enable her to heal from arthritis. 2. we discussed the role of tumor in decreasing inflammation. 3. She is currently on vitamin D and I encouraged her to continue with it. 4. Eating less red meat and more fruits and vegetables would also decrease systemic inflammation.  I encouraged her to return to see [place] if there are any further hematological problems. I thank you for requesting our consultation.   All questions were answered. The patient knows to call the clinic  with any problems, questions or concerns.    Rulon Eisenmenger, MD 11/08/16

## 2016-11-08 NOTE — Assessment & Plan Note (Addendum)
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 I reviewed her peripheral smear and there are no abnormal cells. I discussed with her that the primary cause of leukocytosis is chronic inflammation. We discussed different sites of inflammation the patient might have. She has chronic arthritis as well as chronic dermatitis.  We went through the entire pathway of what constitutes elevated white blood cell count. We also discussed the different types of white blood cells.  Recommendations to decrease chronic inflammation: 1. I encouraged her to lose weight as this would enable her to heal from arthritis. 2. we discussed the role of tumor in decreasing inflammation. 3. She is currently on vitamin D and I encouraged her to continue with it. 4. Eating less red meat and more fruits and vegetables would also decrease systemic inflammation.  I encouraged her to return to see [place] if there are any further hematological problems. I thank you for requesting our consultation.

## 2016-12-02 ENCOUNTER — Other Ambulatory Visit: Payer: Self-pay | Admitting: Family Medicine

## 2016-12-22 ENCOUNTER — Encounter: Payer: Self-pay | Admitting: Internal Medicine

## 2016-12-22 ENCOUNTER — Ambulatory Visit (HOSPITAL_COMMUNITY)
Admission: RE | Admit: 2016-12-22 | Discharge: 2016-12-22 | Disposition: A | Discharge status: Home / Self Care | Payer: BLUE CROSS/BLUE SHIELD | Source: Ambulatory Visit | Attending: Family Medicine | Admitting: Family Medicine

## 2016-12-22 ENCOUNTER — Ambulatory Visit (INDEPENDENT_AMBULATORY_CARE_PROVIDER_SITE_OTHER): Payer: BLUE CROSS/BLUE SHIELD | Admitting: Internal Medicine

## 2016-12-22 VITALS — BP 162/88 | HR 53 | Temp 98.3°F | Wt 204.0 lb

## 2016-12-22 DIAGNOSIS — R0789 Other chest pain: Secondary | ICD-10-CM | POA: Insufficient documentation

## 2016-12-22 DIAGNOSIS — E1121 Type 2 diabetes mellitus with diabetic nephropathy: Secondary | ICD-10-CM

## 2016-12-22 DIAGNOSIS — R001 Bradycardia, unspecified: Secondary | ICD-10-CM | POA: Insufficient documentation

## 2016-12-22 DIAGNOSIS — R9431 Abnormal electrocardiogram [ECG] [EKG]: Secondary | ICD-10-CM | POA: Diagnosis not present

## 2016-12-22 DIAGNOSIS — R079 Chest pain, unspecified: Secondary | ICD-10-CM

## 2016-12-22 LAB — GLUCOSE, POCT (MANUAL RESULT ENTRY): POC Glucose: 93 mg/dl (ref 70–99)

## 2016-12-22 LAB — POCT GLYCOSYLATED HEMOGLOBIN (HGB A1C): Hemoglobin A1C: 6.1

## 2016-12-22 NOTE — Progress Notes (Signed)
   Subjective:   Patient: Debra Diaz       Birthdate: 16-Jul-1976       MRN: [id number]      HPI  Debra Diaz is a 41 y.o. female presenting for same day appt for chest heaviness.   Chest heaviness Began three days ago. Is intermittent and not present currently. Also occurred two months ago. Patient thinks that this occurs when her blood sugar is high. Reports that the symptoms occurred yesterday when her blood sugar was 180 when she checked it at home. Denies accompanying SOB. Describes sensation as heaviness, not pain, and does not radiate anywhere. Heaviness located substernally. Does not seem to worsen or improve with activity or rest. Of note, patient had resection of L atrial myxoma in 02/2015. She was last seen by her cardiologist ([name]) in 08/2015, and was subsequently told she did not need to continue with cardiology follow ups.   Smoking status reviewed. Patient is non-smoker.   Review of Systems See HPI.     Objective:  Physical Exam  Constitutional: She is oriented to person, place, and time and well-developed, well-nourished, and in no distress.  HENT:  Head: Normocephalic and atraumatic.  Eyes: Conjunctivae and EOM are normal. Right eye exhibits no discharge. Left eye exhibits no discharge.  Cardiovascular: Normal rate, regular rhythm and normal heart sounds.   No murmur heard. Pulmonary/Chest: Effort normal and breath sounds normal. No respiratory distress. She has no wheezes.  Musculoskeletal:  No TTP of chest wall  Neurological: She is alert and oriented to person, place, and time.  Skin:  Well-healed surgical scar located under R breast  Psychiatric: Affect and judgment normal.     Assessment & Plan:  Chest heaviness Describing as specifically different than chest pain experienced in past, though same as sensation patient felt prior to removal of myxoma. Hx removal L atrial myxoma in 02/2015. EKG with ST/T wave abnormalities in some  leads, though not concerning for acute process. Sinus bradycardia also present, though this is noted on most of patient's prior EKGs. Pain not reproducible on palpation, so less likely MSK etiology. Patient does have history of anxiety which could be at least contributing. Given EKG changes and accompanying chest heaviness, feel that follow up with cardiology is indicated.  - Patient instructed to call cardiology ([name]) today to schedule appointment. Given Dr. Macky Lower number as patient has lost it.    Adin Hector, MD, MPH PGY-2 [facility] Pager [phone]

## 2016-12-22 NOTE — Assessment & Plan Note (Signed)
Describing as specifically different than chest pain experienced in past, though same as sensation patient felt prior to removal of myxoma. Hx removal L atrial myxoma in 02/2015. EKG with ST/T wave abnormalities in some leads, though not concerning for acute process. Sinus bradycardia also present, though this is noted on most of patient's prior EKGs. Pain not reproducible on palpation, so less likely MSK etiology. Patient does have history of anxiety which could be at least contributing. Given EKG changes and accompanying chest heaviness, feel that follow up with cardiology is indicated.  - Patient instructed to call cardiology ([name]) today to schedule appointment. Given Dr. Macky Lower number as patient has lost it.

## 2016-12-22 NOTE — Patient Instructions (Addendum)
It was nice meeting you today Debra Diaz!  Your EKG was slightly unusual in the office today, so I think it would be a good idea for you to follow up with your cardiologist at your earliest convenience. If the chest heaviness turns into chest pain, or if you start to have trouble breathing, please call our office or go to the emergency room.   Your blood sugar and hemoglobin A1C (the average of your blood sugar over the past three months) were good today.   Please call your cardiologist today to schedule an appointment.   If you have any questions or concerns, please feel free to call the clinic.   Be well,  Dr. Avon Gully

## 2016-12-25 ENCOUNTER — Encounter: Payer: Self-pay | Admitting: Cardiology

## 2016-12-25 ENCOUNTER — Ambulatory Visit (INDEPENDENT_AMBULATORY_CARE_PROVIDER_SITE_OTHER): Payer: BLUE CROSS/BLUE SHIELD | Admitting: Cardiology

## 2016-12-25 VITALS — BP 110/72 | HR 61 | Ht 64.0 in | Wt 207.0 lb

## 2016-12-25 DIAGNOSIS — R0789 Other chest pain: Secondary | ICD-10-CM | POA: Diagnosis not present

## 2016-12-25 MED ORDER — COLCHICINE 0.6 MG PO TABS: ORAL_TABLET | ORAL | 3 refills | Status: DC

## 2016-12-25 NOTE — Progress Notes (Signed)
Electrophysiology Office Note   Date:  12/25/2016   ID:  Debra Diaz, Debra Diaz 11/28/1976, MRN [id number]  PCP:  [name], MD  Primary Electrophysiologist:  Constance Haw, MD    Chief Complaint  Patient presents with  . Follow-up    Chest Pain  . Fatigue     History of Present Illness: Debra Diaz is a 41 y.o. female who presents today for electrophysiology evaluation.   Has been having chest pain and shortness of breath.  She had a Myoview in September 2017 that was a low risk study with no evidence of ischemia and a normal ejection fraction. Returns to clinic today with ongoing chest pain.She says that the pain is in the center of her chest. The pain is sometimes associated with exertion sometimes associated with rest. She says that at times walking makes the pain worse. When she takes a deep breath the pain is worse, and it is worse when she sits back on the couch.  Today, she denies symptoms of palpitations,  shortness of breath, orthopnea, PND, lower extremity edema, claudication, dizziness, presyncope, syncope, bleeding, or neurologic sequela. The patient is tolerating medications without difficulties and is otherwise without complaint today.    Past Medical History:  Diagnosis Date  . Anemia   . Diabetes mellitus without complication ([place])   . Hypertension   . Morbid obesity ([place])   . 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 [place] and 2011   x 2  . LEFT HEART CATHETERIZATION WITH CORONARY ANGIOGRAM N/A 03/02/2015   Procedure: LEFT HEART CATHETERIZATION WITH CORONARY ANGIOGRAM;  Surgeon: [name], MD;  Location: [facility] CATH LAB;  Service: Cardiovascular;  Laterality: N/A;  . MINIMALLY INVASIVE EXCISION OF ATRIAL MYXOMA N/A 03/04/2015   Procedure: MINIMALLY INVASIVE RESECTION OF LEFT ATRIAL MYXOMA ( USING A BILAYER PATCH CLOSURE);  Surgeon:  [name], MD;  Location: [place];  Service: Open Heart Surgery;  Laterality: N/A;  . TEE WITHOUT CARDIOVERSION N/A 03/01/2015   Procedure: TRANSESOPHAGEAL ECHOCARDIOGRAM (TEE);  Surgeon: [name], MD;  Location[facility] ENDOSCOPY;  Service: Cardiovascular;  Laterality: N/A;  . TEE WITHOUT CARDIOVERSION N/A 03/04/2015   Procedure: TRANSESOPHAGEAL ECHOCARDIOGRAM (TEE);  Surgeon: [name], MD;  Location: [place];  Service: Open Heart Surgery;  Laterality: N/A;  . TUBAL LIGATION  2011     Current Outpatient Prescriptions  Medication Sig Dispense Refill  . cholecalciferol (VITAMIN D) 1000 units tablet Take 1,000 Units by mouth daily.    Marland Kitchen lisinopril-hydrochlorothiazide (PRINZIDE,ZESTORETIC) 20-25 MG tablet TAKE 1 TABLET BY MOUTH EVERY DAY 30 tablet 0  . metFORMIN (GLUCOPHAGE) 500 MG tablet Take 1 tablet (500 mg total) by mouth daily with breakfast. 30 tablet 11  . colchicine 0.6 MG tablet Take 1 tablet (0.6 mg total) twice daily for 2 weeks.  Then reduce and take 1 tablet (0.6 mg total) once daily 45 tablet 3   No current facility-administered medications for this visit.     Allergies:   Patient has no known allergies.   Social History:  The patient  reports that she has never smoked. She has never used smokeless tobacco. She reports that she drinks alcohol. She reports that she does not use drugs.   Family History:  The patient's family history includes Asthma in her son; Diabetes in her father; Hypertension in her mother.    ROS:  Please see the history of present illness.   Otherwise, review of systems is positive for chest pain.   All other systems are reviewed and negative.    PHYSICAL EXAM: VS:  BP 110/72   Pulse 61   Ht 5\' 4"  (1.626 m)   Wt 207 lb (93.9 kg)   BMI 35.53 kg/m  , BMI Body mass index is 35.53 kg/m. GEN: Well nourished, well developed, in no acute distress  HEENT: normal  Neck: no JVD, carotid bruits, or masses Cardiac: RRR; no murmurs, rubs, or  gallops,no edema  Respiratory:  clear to auscultation bilaterally, normal work of breathing GI: soft, nontender, nondistended, + BS MS: no deformity or atrophy pain with palpation of the right chest  Skin: warm and dry Neuro:  Strength and sensation are intact Psych: euthymic mood, full affect  EKG:  EKG is ordered today. Personal review of the ekg ordered shows sinus rhythm, rate 61  Recent Labs: 04/22/2016: ALT 24 09/01/2016: BUN 11; Creat 0.64; Potassium 4.4; Sodium 138; TSH 1.32 10/03/2016: Hemoglobin 12.7; Platelets 333    Lipid Panel     Component Value Date/Time   CHOL 157 09/01/2016 1607   TRIG 211 (H) 09/01/2016 1607   HDL 39 (L) 09/01/2016 1607   CHOLHDL 4.0 09/01/2016 1607   VLDL 42 (H) 09/01/2016 1607   LDLCALC 76 09/01/2016 1607     Wt Readings from Last 3 Encounters:  12/25/16 207 lb (93.9 kg)  12/22/16 204 lb (92.5 kg)  11/08/16 203 lb 4.8 oz (92.2 kg)      Other studies Reviewed: Additional studies/ records that were reviewed today include: TTE 2016  Review of the above records today demonstrates:   - Left ventricle: Systolic function was normal. The estimated ejection fraction was in the range of 55% to 60%. Wall motion was normal; there were no regional wall motion abnormalities. - Aortic valve: No evidence of vegetation. - Mitral valve: No evidence of vegetation. - Atrial septum: No defect or patent foramen ovale was identified. - Tricuspid valve: No evidence of vegetation. - Pulmonic valve: No evidence of vegetation.  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  Myoview 09/13/16  Nuclear stress EF: 58%.  There was no ST segment deviation noted during stress.  The study is normal.  This is a low risk study.  The left ventricular ejection fraction is normal (55-65%).   Normal pharmacologic nuclear stress test with no evidence of prior infarct or  ischemia.   ASSESSMENT AND PLAN:  1.  Chest pain: Her chest pain is quite atypical for coronary disease. She is also had a Myoview that showed no evidence of coronary artery disease. Her pain is worse with deep breathing, she does not have pain to palpation. It is possible that she has pericarditis. Her EKG does not show any ST changes nor does it show PR depression. That being said, Debra Diaz start her on colchicine 0.6 mg twice a day for 2 weeks followed by 0.6 mg a day. We'll also repeat her echocardiogram. It is possible that this chest pain is all due to musculoskeletal causes.  2. Hypertension: Currently well controlled  3. Obesity: Weight loss encouraged  Current medicines are reviewed at length with the patient today.   The patient does not have concerns regarding her medicines.  The following changes were made today:  Colchicine  Labs/ tests ordered today include:  Orders Placed This  Encounter  Procedures  . EKG 12-Lead  . ECHOCARDIOGRAM COMPLETE     Disposition:   FU with Debra Diaz 3 months   Signed, Debra Diaz Meredith Leeds, MD  12/25/2016 3:21 PM     [place] [address] [place] [address] [phone] (office) [phone] (fax)

## 2016-12-25 NOTE — Patient Instructions (Addendum)
Medication Instructions:   Your physician has recommended you make the following change in your medication:  1) START Colchicine 0.6 mg twice a day for 2 weeks. Then reduce and take 0.6 mg once a day.            --- If you need a refill on your cardiac medications before your next appointment, please call your pharmacy. ---  Labwork:  None ordered  Testing/Procedures: Your physician has requested that you have an echocardiogram. Echocardiography is a painless test that uses sound waves to create images of your heart. It provides your doctor with information about the size and shape of your heart and how well your heart's chambers and valves are working. This procedure takes approximately one hour. There are no restrictions for this procedure.  Follow-Up:  Your physician recommends that you schedule a follow-up appointment in: 3 months with [name].  Thank you for choosing [facility]!!   Trinidad Curet, RN [phone]   Any Other Special Instructions Will Be Listed Below (If Applicable).  Colchicine tablets or capsules What is this medicine? COLCHICINE (KOL chi seen) is for joint pain and swelling due to attacks of acute gouty arthritis. The medicine is also used to treat familial Mediterranean fever. This medicine may be used for other purposes; ask your health care provider or pharmacist if you have questions. COMMON BRAND NAME(S): Colcrys, MITIGARE What should I tell my health care provider before I take this medicine? They need to know if you have any of these conditions: -anemia -blood disorders like leukemia or lymphoma -heart disease -immune system problems -intestinal disease -kidney disease -liver disease -muscle pain or weakness -take other medicines -stomach problems -an unusual or allergic reaction to colchicine, other medicines, lactose, foods, dyes, or preservatives -pregnant or trying to get pregnant -breast-feeding How should I use this  medicine? Take this medicine by mouth with a full glass of water. Follow the directions on the prescription label. You can take it with or without food. If it upsets your stomach, take it with food. Take your medicine at regular intervals. Do not take your medicine more often than directed. A special MedGuide will be given to you by the pharmacist with each prescription and refill. Be sure to read this information carefully each time. Talk to your pediatrician regarding the use of this medicine in children. While this drug may be prescribed for children as young as 46 years old for selected conditions, precautions do apply. Patients over 70 years old may have a stronger reaction and need a smaller dose. Overdosage: If you think you have taken too much of this medicine contact a poison control center or emergency room at once. NOTE: This medicine is only for you. Do not share this medicine with others. What if I miss a dose? If you miss a dose, take it as soon as you can. If it is almost time for your next dose, take only that dose. Do not take double or extra doses. What may interact with this medicine? Do not take this medicine with any of the following medications: -certain medicines for fungal infections like itraconazole This medicine may also interact with the following medications: -alcohol -certain medicines for cholesterol like atorvastatin -certain medicines for coughs and colds -certain medicines to help you breathe better -cyclosporine -digoxin -epinephrine -grapefruit or grapefruit juice -methenamine -other medicines for fungal infection -sodium bicarbonate -some antibiotics like clarithromycin, erythromycin, and telithromycin -some medicines for an irregular heartbeat or other heart problems -some  medicines for cancer, like lapatinib and tamoxifen -some medicines for HIV This list may not describe all possible interactions. Give your health care provider a list of all the  medicines, herbs, non-prescription drugs, or dietary supplements you use. Also tell them if you smoke, drink alcohol, or use illegal drugs. Some items may interact with your medicine. What should I watch for while using this medicine? Visit your doctor or health care professional for regular checks on your progress. You may need periodic blood checks. Alcohol can increase the chance of getting stomach problems and gout attacks. Do not drink alcohol. What side effects may I notice from receiving this medicine? Side effects that you should report to your doctor or health care professional as soon as possible: -allergic reactions like skin rash, itching or hives, swelling of the face, lips, or tongue -fever, chills, or sore throat -muscle tenderness, pain, or weakness -numbness or tingling in hands or feet -unusual bleeding or bruising -unusually weak or tired -vomiting Side effects that usually do not require medical attention (report to your doctor or health care professional if they continue or are bothersome): -diarrhea -hair loss -loss of appetite -stomach pain or nausea This list may not describe all possible side effects. Call your doctor for medical advice about side effects. You may report side effects to FDA at 1-800-FDA-1088. Where should I keep my medicine? Keep out of the reach of children. Store at room temperature between 15 and 30 degrees C (59 and 86 degrees F). Keep container tightly closed. Protect from light. Throw away any unused medicine after the expiration date. NOTE: This sheet is a summary. It may not cover all possible information. If you have questions about this medicine, talk to your doctor, pharmacist, or health care provider.  2017 Elsevier/Gold Standard (2013-06-02 16:48:38)

## 2017-01-03 ENCOUNTER — Other Ambulatory Visit (HOSPITAL_COMMUNITY): Payer: BLUE CROSS/BLUE SHIELD

## 2017-01-09 ENCOUNTER — Ambulatory Visit (HOSPITAL_COMMUNITY): Payer: BLUE CROSS/BLUE SHIELD | Attending: Internal Medicine

## 2017-01-09 ENCOUNTER — Other Ambulatory Visit: Payer: Self-pay

## 2017-01-09 DIAGNOSIS — E669 Obesity, unspecified: Secondary | ICD-10-CM | POA: Diagnosis not present

## 2017-01-09 DIAGNOSIS — R0789 Other chest pain: Secondary | ICD-10-CM | POA: Insufficient documentation

## 2017-01-09 DIAGNOSIS — Z6835 Body mass index (BMI) 35.0-35.9, adult: Secondary | ICD-10-CM | POA: Diagnosis not present

## 2017-01-09 DIAGNOSIS — E119 Type 2 diabetes mellitus without complications: Secondary | ICD-10-CM | POA: Insufficient documentation

## 2017-01-09 DIAGNOSIS — I10 Essential (primary) hypertension: Secondary | ICD-10-CM | POA: Diagnosis not present

## 2017-01-09 DIAGNOSIS — R079 Chest pain, unspecified: Secondary | ICD-10-CM | POA: Diagnosis present

## 2017-02-21 ENCOUNTER — Other Ambulatory Visit: Payer: Self-pay | Admitting: Family Medicine

## 2017-04-09 ENCOUNTER — Ambulatory Visit: Payer: BLUE CROSS/BLUE SHIELD | Admitting: Cardiology

## 2017-04-09 NOTE — Progress Notes (Deleted)
Electrophysiology Office Note   Date:  04/09/2017   ID:  Debra Diaz, Debra Diaz [birth year], MRN [id number]  PCP:  [name], MD  Primary Electrophysiologist:  Constance Haw, MD    No chief complaint on file.    History of Present Illness: Debra Diaz is a 41 y.o. female who presents today for electrophysiology evaluation.   Has been having chest pain and shortness of breath.  She had a Myoview in September 2017 that was a low risk study with no evidence of ischemia and a normal ejection fraction. Returns to clinic today with ongoing chest pain.She says that the pain is in the center of her chest. The pain is sometimes associated with exertion sometimes associated with rest. She says that at times walking makes the pain worse. When she takes a deep breath the pain is worse, and it is worse when she sits back on the couch.***  Today, she denies symptoms of palpitations,  shortness of breath, orthopnea, PND, lower extremity edema, claudication, dizziness, presyncope, syncope, bleeding, or neurologic sequela. The patient is tolerating medications without difficulties and is otherwise without complaint today.    Past Medical History:  Diagnosis Date  . Anemia   . Diabetes mellitus without complication ([place])   . Hypertension   . Morbid obesity ([place])   . 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 [place] and 2011   x 2  . LEFT HEART CATHETERIZATION WITH CORONARY ANGIOGRAM N/A 03/02/2015   Procedure: LEFT HEART CATHETERIZATION WITH CORONARY ANGIOGRAM;  Surgeon: [name], MD;  Location: [facility] CATH LAB;  Service: Cardiovascular;  Laterality: N/A;  . MINIMALLY INVASIVE EXCISION OF ATRIAL MYXOMA N/A 03/04/2015   Procedure: MINIMALLY INVASIVE RESECTION OF LEFT ATRIAL MYXOMA ( USING A BILAYER PATCH CLOSURE);  Surgeon: [name], MD;  Location: [place];  Service: Open  Heart Surgery;  Laterality: N/A;  . TEE WITHOUT CARDIOVERSION N/A 03/01/2015   Procedure: TRANSESOPHAGEAL ECHOCARDIOGRAM (TEE);  Surgeon: [name], MD;  Location[facility] ENDOSCOPY;  Service: Cardiovascular;  Laterality: N/A;  . TEE WITHOUT CARDIOVERSION N/A 03/04/2015   Procedure: TRANSESOPHAGEAL ECHOCARDIOGRAM (TEE);  Surgeon: [name], MD;  Location: [place];  Service: Open Heart Surgery;  Laterality: N/A;  . TUBAL LIGATION  2011     Current Outpatient Prescriptions  Medication Sig Dispense Refill  . cholecalciferol (VITAMIN D) 1000 units tablet Take 1,000 Units by mouth daily.    . colchicine 0.6 MG tablet Take 1 tablet (0.6 mg total) twice daily for 2 weeks.  Then reduce and take 1 tablet (0.6 mg total) once daily 45 tablet 3  . ibuprofen (ADVIL,MOTRIN) 800 MG tablet TAKE 1 TABLET BY MOUTH EVERY 8 HOURS AS NEEDED 60 tablet 0  . lisinopril-hydrochlorothiazide (PRINZIDE,ZESTORETIC) 20-25 MG tablet TAKE 1 TABLET BY MOUTH EVERY DAY 30 tablet 0  . metFORMIN (GLUCOPHAGE) 500 MG tablet Take 1 tablet (500 mg total) by mouth daily with breakfast. 30 tablet 11   No current facility-administered medications for this visit.     Allergies:   Patient has no known allergies.   Social History:  The patient  reports that she has never smoked. She has never used smokeless tobacco. She reports that she drinks alcohol. She reports that she does not use drugs.   Family History:  The patient's family history includes Asthma in her son; Diabetes in her father; Hypertension in  her mother.    ROS:  Please see the history of present illness.   Otherwise, review of systems is positive for ***.   All other systems are reviewed and negative.    PHYSICAL EXAM: VS:  There were no vitals taken for this visit. , BMI There is no height or weight on file to calculate BMI. GEN: Well nourished, well developed, in no acute distress  HEENT: normal  Neck: no JVD, carotid bruits, or masses Cardiac: ***RRR; no  murmurs, rubs, or gallops,no edema  Respiratory:  clear to auscultation bilaterally, normal work of breathing GI: soft, nontender, nondistended, + BS MS: no deformity or atrophy  Skin: warm and dry Neuro:  Strength and sensation are intact Psych: euthymic mood, full affect   EKG:  EKG is ordered today. Personal review of the ekg ordered shows sinus rhythm, rate 61***  Recent Labs: 04/22/2016: ALT 24 09/01/2016: BUN 11; Creat 0.64; Potassium 4.4; Sodium 138; TSH 1.32 10/03/2016: Hemoglobin 12.7; Platelets 333    Lipid Panel     Component Value Date/Time   CHOL 157 09/01/2016 1607   TRIG 211 (H) 09/01/2016 1607   HDL 39 (L) 09/01/2016 1607   CHOLHDL 4.0 09/01/2016 1607   VLDL 42 (H) 09/01/2016 1607   LDLCALC 76 09/01/2016 1607     Wt Readings from Last 3 Encounters:  12/25/16 207 lb (93.9 kg)  12/22/16 204 lb (92.5 kg)  11/08/16 203 lb 4.8 oz (92.2 kg)      Other studies Reviewed: Additional studies/ records that were reviewed today include: TTE 2016  Review of the above records today demonstrates:   - Left ventricle: Systolic function was normal. The estimated ejection fraction was in the range of 55% to 60%. Wall motion was normal; there were no regional wall motion abnormalities. - Aortic valve: No evidence of vegetation. - Mitral valve: No evidence of vegetation. - Atrial septum: No defect or patent foramen ovale was identified. - Tricuspid valve: No evidence of vegetation. - Pulmonic valve: No evidence of vegetation.  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  Myoview 09/13/16  Nuclear stress EF: 58%.  There was no ST segment deviation noted during stress.  The study is normal.  This is a low risk study.  The left ventricular ejection fraction is normal (55-65%).   Normal pharmacologic nuclear stress test with no evidence of prior infarct or ischemia.    ASSESSMENT AND PLAN:  1.  Chest pain: Her chest pain is quite atypical for coronary disease. She is also had a Myoview that showed no evidence of coronary artery disease. Her pain is worse with deep breathing, she does not have pain to palpation. It is possible that she has pericarditis. Her EKG does not show any ST changes nor does it show PR depression. That being said, Aleczander Fandino start her on colchicine 0.6 mg twice a day for 2 weeks followed by 0.6 mg a day. We'll also repeat her echocardiogram. It is possible that this chest pain is all due to musculoskeletal causes.***  2. Hypertension: Currently well controlled***  3. Obesity: Weight loss encouraged***  Current medicines are reviewed at length with the patient today.   The patient does not have concerns regarding her medicines.  The following changes were made today:  ***  Labs/ tests ordered today include:  No orders of the defined types were placed in this encounter.    Disposition:  FU with Jereline Ticer *** months   Signed, Nneoma Harral Meredith Leeds, MD  04/09/2017 8:29 AM     [facility] [address] [place] [address] [phone] (office) [phone] (fax)

## 2017-04-10 ENCOUNTER — Encounter: Payer: Self-pay | Admitting: Cardiology

## 2017-05-18 ENCOUNTER — Encounter (HOSPITAL_COMMUNITY): Payer: Self-pay | Admitting: Emergency Medicine

## 2017-05-18 ENCOUNTER — Emergency Department (HOSPITAL_COMMUNITY)
Admission: EM | Admit: 2017-05-18 | Discharge: 2017-05-18 | Disposition: A | Discharge status: Home / Self Care | Payer: BLUE CROSS/BLUE SHIELD | Attending: Emergency Medicine | Admitting: Emergency Medicine

## 2017-05-18 ENCOUNTER — Emergency Department (HOSPITAL_COMMUNITY): Payer: BLUE CROSS/BLUE SHIELD

## 2017-05-18 ENCOUNTER — Ambulatory Visit: Payer: BLUE CROSS/BLUE SHIELD | Admitting: Student

## 2017-05-18 DIAGNOSIS — I10 Essential (primary) hypertension: Secondary | ICD-10-CM | POA: Diagnosis not present

## 2017-05-18 DIAGNOSIS — Z79899 Other long term (current) drug therapy: Secondary | ICD-10-CM | POA: Insufficient documentation

## 2017-05-18 DIAGNOSIS — E119 Type 2 diabetes mellitus without complications: Secondary | ICD-10-CM | POA: Insufficient documentation

## 2017-05-18 DIAGNOSIS — Z7984 Long term (current) use of oral hypoglycemic drugs: Secondary | ICD-10-CM | POA: Insufficient documentation

## 2017-05-18 DIAGNOSIS — R079 Chest pain, unspecified: Secondary | ICD-10-CM

## 2017-05-18 LAB — I-STAT TROPONIN, ED
Troponin i, poc: 0 ng/mL (ref 0.00–0.08)
Troponin i, poc: 0.01 ng/mL (ref 0.00–0.08)

## 2017-05-18 LAB — BASIC METABOLIC PANEL
Anion gap: 10 (ref 5–15)
BUN: 11 mg/dL (ref 6–20)
CO2: 23 mmol/L (ref 22–32)
Calcium: 9.3 mg/dL (ref 8.9–10.3)
Chloride: 103 mmol/L (ref 101–111)
Creatinine, Ser: 0.59 mg/dL (ref 0.44–1.00)
GFR calc Af Amer: >60 mL/min (ref >60)
GFR calc non Af Amer: >60 mL/min (ref >60)
Glucose, Bld: 125 mg/dL — ABNORMAL HIGH (ref 65–99)
Potassium: 3.6 mmol/L (ref 3.5–5.1)
Sodium: 136 mmol/L (ref 135–145)

## 2017-05-18 LAB — CBC
HCT: 40.4 % (ref 36.0–46.0)
Hemoglobin: 12.8 g/dL (ref 12.0–15.0)
MCH: 25.1 pg — ABNORMAL LOW (ref 26.0–34.0)
MCHC: 31.7 g/dL (ref 30.0–36.0)
MCV: 79.4 fL (ref 78.0–100.0)
Platelets: 352 10*3/uL (ref 150–400)
RBC: 5.09 MIL/uL (ref 3.87–5.11)
RDW: 15.8 % — ABNORMAL HIGH (ref 11.5–15.5)
WBC: 12.6 10*3/uL — ABNORMAL HIGH (ref 4.0–10.5)

## 2017-05-18 MED ORDER — DIPHENHYDRAMINE HCL 50 MG/ML IJ SOLN
25.0000 mg | Freq: Once | INTRAMUSCULAR | Status: AC
  Administered 2017-05-18: 25 mg via INTRAVENOUS
  Filled 2017-05-18: qty 1

## 2017-05-18 MED ORDER — METOCLOPRAMIDE HCL 5 MG/ML IJ SOLN
10.0000 mg | Freq: Once | INTRAMUSCULAR | Status: AC
  Administered 2017-05-18: 10 mg via INTRAVENOUS
  Filled 2017-05-18: qty 2

## 2017-05-18 MED ORDER — SODIUM CHLORIDE 0.9 % IV BOLUS (SEPSIS)
1000.0000 mL | Freq: Once | INTRAVENOUS | Status: AC
  Administered 2017-05-18: 1000 mL via INTRAVENOUS

## 2017-05-18 NOTE — ED Notes (Signed)
Her chest pain is only slight at present

## 2017-05-18 NOTE — ED Notes (Signed)
The pts headache is completely gone

## 2017-05-18 NOTE — ED Notes (Signed)
Headache chest pain for 2 days no n v or diarrhea

## 2017-05-18 NOTE — ED Triage Notes (Signed)
Pt reports headache that began yesterday and chest pain and sob that began today. Describes cp as central and heavy, reports hx of myxoma in her chest that she had removed last year, states cp feels similar. Pt a/ox4, resp e/u.

## 2017-05-18 NOTE — ED Provider Notes (Signed)
[facility] DEPT Provider Note   CSN: [id number] Arrival date & time: 05/18/17  1431     History   Chief Complaint Chief Complaint  Patient presents with  . Chest Pain  . Headache    HPI Debra Diaz is a 41 y.o. female.  The history is provided by the patient and medical records. No language interpreter was used.  Chest Pain   This is a recurrent problem. The current episode started 3 to 5 hours ago. The problem occurs constantly. The problem has been gradually improving. The pain is associated with rest. The pain is present in the substernal region. The pain is at a severity of 7/10. The pain is moderate. The quality of the pain is described as heavy and pressure-like. The pain radiates to the upper back. Duration of episode(s) is 3 hours. The symptoms are aggravated by certain positions. Associated symptoms include headaches. Pertinent negatives include no abdominal pain, no back pain, no cough, no fever, no palpitations, no shortness of breath and no vomiting. She has tried nothing for the symptoms. Risk factors include obesity.  Her past medical history is significant for diabetes and hypertension.  Pertinent negatives for past medical history include no seizures.  Procedure history is positive for stress echo (in 08/2016).    Past Medical History:  Diagnosis Date  . Anemia   . Diabetes mellitus without complication ([place])   . Hypertension   . Morbid obesity ([place])   . 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 Chest heaviness 12/22/2016  . Leukocytosis 10/03/2016  . Anxiety reaction 08/15/2016  . Healthcare maintenance 06/12/2016  . Concussion 05/05/2016  . 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 Morbid obesity ([place])   . Chest pain with high risk of acute coronary syndrome 02/27/2015  . Chest pain 02/26/2015  . Headache 02/26/2015  . Elevated  troponin I level 02/26/2015  . HTN (hypertension) 02/26/2015  . DM2 (diabetes mellitus, type 2) ([place]) 02/26/2015    Past Surgical History:  Procedure Laterality Date  . [place] and 2011   x 2  . LEFT HEART CATHETERIZATION WITH CORONARY ANGIOGRAM N/A 03/02/2015   Procedure: LEFT HEART CATHETERIZATION WITH CORONARY ANGIOGRAM;  Surgeon: [name], MD;  [facility] C[facility] LAB;  Service: Cardiovascular;  Laterality: N/A;  . MINIMALLY INVASIVE EXCISION OF ATRIAL MYXOMA N/A 03/04/2015   Procedure: MINIMALLY INVASIVE RESECTION OF LEFT ATRIAL MYXOMA ( USING A BILAYER PATCH CLOSURE);  Surgeon: [name], MD;  Location: [place];  Service: Open Heart Surgery;  Laterality: N/A;  . myxoma N/A    chest  . TEE WITHOUT CARDIOVERSION N/A 03/01/2015   Procedure: TRANSESOPHAGEAL ECHOCARDIOGRAM (TEE);  Surgeon: [name], MD;  Location: [facility] ENDOSCOPY;  Service: Cardiovascular;  Laterality: N/A;  . TEE WITHOUT CARDIOVERSION N/A 03/04/2015   Procedure: TRANSESOPHAGEAL ECHOCARDIOGRAM (TEE);  Surgeon: [name], MD;  Location: [facility];  Service: Open Heart Surgery;  Laterality: N/A;  . TUBAL LIGATION  2011    OB History    No data available       Home Medications    Prior to Admission medications   Medication Sig Start Date End Date Taking? Authorizing Provider  ibuprofen (ADVIL,MOTRIN) 200 MG tablet Take 1,000-1,200 mg by mouth daily as needed for headache.   Yes [provider]  lisinopril-hydrochlorothiazide (PRINZIDE,ZESTORETIC) 20-25 MG tablet TAKE 1 TABLET  BY MOUTH EVERY DAY 09/08/16  Yes [name], MD  metFORMIN (GLUCOPHAGE) 500 MG tablet Take 1 tablet (500 mg total) by mouth daily with breakfast. Patient taking differently: Take 500 mg by mouth 2 (two) times daily with a meal.  08/29/16  Yes Vivi Barrack, MD  colchicine 0.6 MG tablet Take 1 tablet (0.6 mg total) twice daily for 2 weeks.  Then reduce and take 1 tablet (0.6 mg total) once  daily Patient not taking: Reported on 05/18/2017 12/25/16   Constance Haw, MD  ibuprofen (ADVIL,MOTRIN) 800 MG tablet TAKE 1 TABLET BY MOUTH EVERY 8 HOURS AS NEEDED Patient not taking: Reported on 05/18/2017 02/22/17   [name], MD    Family History Family History  Problem Relation Age of Onset  . Hypertension Mother   . Diabetes Father   . Asthma Son     Social History Social History  Substance Use Topics  . Smoking status: Never Smoker  . Smokeless tobacco: Never Used  . Alcohol use Yes     Comment: occasional     Allergies   Patient has no known allergies.   Review of Systems Review of Systems  Constitutional: Negative for chills and fever.  HENT: Negative for ear pain and sore throat.   Eyes: Negative for pain and visual disturbance.  Respiratory: Negative for cough and shortness of breath.   Cardiovascular: Positive for chest pain. Negative for palpitations.  Gastrointestinal: Negative for abdominal pain and vomiting.  Genitourinary: Negative for dysuria and hematuria.  Musculoskeletal: Negative for arthralgias and back pain.  Skin: Negative for color change and rash.  Neurological: Positive for headaches. Negative for seizures and syncope.  All other systems reviewed and are negative.    Physical Exam Updated Vital Signs BP 109/69   Pulse 60   Temp 98.9 F (37.2 C) (Oral)   Resp 20   LMP 04/28/2017 (Approximate) Comment: Pt states that she cannot get pregnant anymore.  SpO2 97%   Physical Exam  Constitutional: She is oriented to person, place, and time. She appears well-developed and well-nourished. No distress.  HENT:  Head: Normocephalic and atraumatic.  Eyes: Conjunctivae are normal.  Neck: Neck supple.  Cardiovascular: Normal rate and regular rhythm.   No murmur heard. Pulmonary/Chest: Effort normal and breath sounds normal. No respiratory distress.  Abdominal: Soft. There is no tenderness.  Musculoskeletal: She exhibits no edema.   Neurological: She is alert and oriented to person, place, and time.  5/5 motor strength and sensation intact in all extremities  Skin: Skin is warm and dry.  Nursing note and vitals reviewed.    ED Treatments / Results  Labs (all labs ordered are listed, but only abnormal results are displayed) Labs Reviewed  BASIC METABOLIC PANEL - Abnormal; Notable for the following:       Result Value   Glucose, Bld 125 (*)    All other components within normal limits  CBC - Abnormal; Notable for the following:    WBC 12.6 (*)    MCH 25.1 (*)    RDW 15.8 (*)    All other components within normal limits  I-STAT TROPOININ, ED  Randolm Idol, ED    EKG  EKG Interpretation None       Radiology Dg Chest 2 View  Result Date: 05/18/2017 CLINICAL DATA:  Chest pain and shortness of breath EXAM: CHEST  2 VIEW COMPARISON:  Chest radiograph Apr 29, 2016; chest CT September 01, 2016 FINDINGS: There is no appreciable  edema or consolidation. Heart size and pulmonary vascularity are normal. No adenopathy. No evident bone lesions. No pneumothorax. IMPRESSION: No edema or consolidation. Electronically Signed   By: Lowella Grip III M.D.   On: 05/18/2017 15:56    Procedures Procedures (including critical care time)  Medications Ordered in ED Medications  sodium chloride 0.9 % bolus 1,000 mL (0 mLs Intravenous Stopped 05/18/17 1949)  metoCLOPramide (REGLAN) injection 10 mg (10 mg Intravenous Given 05/18/17 1754)  diphenhydrAMINE (BENADRYL) injection 25 mg (25 mg Intravenous Given 05/18/17 1755)     EMERGENCY DEPARTMENT [place] CARDIAC EXAM "Study: Limited Ultrasound of the Heart and Pericardium"  INDICATIONS:Chest pain and history of myxoma Multiple views of the heart and pericardium were obtained in real-time with a multi-frequency probe.  PERFORMED XA:JOINOM IMAGES ARCHIVED?: Yes LIMITATIONS:  Body habitus VIEWS USED: Subcostal 4 chamber, Parasternal long axis, Parasternal short axis, Apical 4  chamber  and Inferior Vena Cava INTERPRETATION: Cardiac activity present, Pericardial effusioin absent, Cardiac tamponade absent and Volume status normal   Initial Impression / Assessment and Plan / ED Course  I have reviewed the triage vital signs and the nursing notes.  Pertinent labs & imaging results that were available during my care of the patient were reviewed by me and considered in my medical decision making (see chart for details).     41 year old female history of diabetes, hypertension, and previous cardiac myxoma s/p resection who presents with chest pain and headache.  She describes onset of diffuse headache since yesterday.  Today, while at rest developed substernal chest pressure.  Appeared mildly pleuritic and nonexertional.  She states she has many family stressors.  She feels her headache is related to current stressors.  She had a negative stress echocardiogram in 08/2016.  She states she last felt his chest pressure in 2016, when she was found to have a left atrial myxoma, which was resected.  She denies any previous MI or stent placement.  He denies fevers or other symptoms.  Afebrile, VSS.  No focal neuro deficits.  Lungs clear to auscultation bilaterally.  Abdomen soft benign throughout.  CBC showing WBC 12.6, Hgb 12.8.  CXR showing no edema or consolidation.  Initial troponin undetectable.  Migraine cocktail of Reglan/Benadryl/IVF provided with complete resolution of headache.  BSUS performed for cardiac evaluation.  No clear evidence of abnormality.  Serial troponin 0.01.  Patient with low risk Wells score and PERC negative.  Doubt ACS, aortic dissection, PE, pericarditis, or pneumonia.  Possible exacerbation of symptoms related to significant stressors.  She is tolerating p.o. and ambulating well, stable for continued outpatient follow-up.  Instructed patient on close cardiology follow-up for formal TTE.  Return precautions provided for worsening symptoms. Pt will f/u  with PCP at first availability. Pt verbalized agreement with plan.  Final Clinical Impressions(s) / ED Diagnoses   Final diagnoses:  Chest pain in adult    New Prescriptions Discharge Medication List as of 05/18/2017  7:23 PM       Melicia Esqueda, Pilar Plate, MD 05/19/17 0335    Carmin Muskrat, MD 05/20/17 [phone]

## 2017-05-22 ENCOUNTER — Encounter: Payer: Self-pay | Admitting: Physician Assistant

## 2017-05-23 NOTE — Progress Notes (Signed)
Cardiology Office Note Date:  05/24/2017  Patient ID:  Debra Diaz, Debra Diaz Jan 11, 1976, MRN [id number] PCP:  [name], MD  Cardiologist:  [name]   Chief Complaint: CP  History of Present Illness: Debra Diaz is a 41 y.o. 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 She comes in today to be seen for [name], last seen by him in January, at that time with ongoing c/o CP, not felt to be coronary given low risk myoview with some characteristics that could be pericarditis started on a regime of colchicine and planned for repeat echo.  More recently she was seen in the ER 05/18/17 with c/o CP, headache, not suspicious of ACS, treated for migraine and suspected to be 2/2 significant personal stressors, she was discharged from the ER. LABS K+ 3.6, BUN/Creat 11/0.59, WBC 12.6 H/H 12/40 plts 352 poc Trop 0.00, 0.01  The patient's first language is Spanish though her English is quite good and she does not require or request offered  translation service, she tells me she understands well, and I understand her English well..  The patient's CP dates back to prior to her myxoma surgery, in 2016.  It is unchanged from back then.  It is a pressure, central chest, lasting seconds in duration only and can radiate to her back, at times seems associated with headache.  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  No palpitations, no dizziness, no near syncope or syncope.  No SOB.  She took the Colchicine as rx and this made no difference, with improvement or change to the symptom.  She denies fever/illness.   Past Medical History:  Diagnosis Date  . Anemia   . Diabetes mellitus without complication ([place])   .  Hypertension   . Morbid obesity ([place])   . 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 [place] and 2011   x 2  . LEFT HEART CATHETERIZATION WITH CORONARY ANGIOGRAM N/A 03/02/2015   Procedure: LEFT HEART CATHETERIZATION WITH CORONARY ANGIOGRAM;  Surgeon: [name], MD;  Location: [facility] CATH LAB;  Service: Cardiovascular;  Laterality: N/A;  . MINIMALLY INVASIVE EXCISION OF ATRIAL MYXOMA N/A 03/04/2015   Procedure: MINIMALLY INVASIVE RESECTION OF LEFT ATRIAL MYXOMA ( USING A BILAYER PATCH CLOSURE);  Surgeon: [name], MD;  Location: [place];  Service: Open Heart Surgery;  Laterality: N/A;  . myxoma N/A    chest  . TEE WITHOUT CARDIOVERSION N/A 03/01/2015   Procedure: TRANSESOPHAGEAL ECHOCARDIOGRAM (TEE);  Surgeon: [name], MD;  Location: [facility] ENDOSCOPY;  Service: Cardiovascular;  Laterality: N/A;  . TEE WITHOUT CARDIOVERSION N/A 03/04/2015   Procedure: TRANSESOPHAGEAL ECHOCARDIOGRAM (TEE);  Surgeon: [name], MD;  Location: [place];  Service: Open Heart Surgery;  Laterality: N/A;  . TUBAL LIGATION  2011    Current Outpatient Prescriptions  Medication Sig Dispense Refill  . ibuprofen (ADVIL,MOTRIN) 800 MG tablet Take 800 mg by mouth every 8 (eight) hours as needed.   0  . lisinopril-hydrochlorothiazide (PRINZIDE,ZESTORETIC) 20-25 MG tablet TAKE 1 TABLET BY MOUTH EVERY DAY 30 tablet 0  . metFORMIN (GLUCOPHAGE) 500  MG tablet Take 1 tablet (500 mg total) by mouth daily with breakfast. (Patient taking differently: Take 500 mg by mouth 2 (two) times daily with a meal. ) 30 tablet 11   No current facility-administered medications for this visit.     Allergies:   Patient has no known allergies.   Social History:  The patient  reports that she has never smoked. She has never used smokeless tobacco. She reports that she drinks alcohol. She reports that she does not use drugs.   Family  History:  The patient's family history includes Asthma in her son; Diabetes in her father; Hypertension in her mother.  ROS:  Please see the history of present illness.    All other systems are reviewed and otherwise negative.   PHYSICAL EXAM:  VS:  BP 118/78   Pulse 60   Ht 5\' 4"  (1.626 m)   Wt 200 lb (90.7 kg)   LMP 04/28/2017 (Approximate) Comment: Pt states that she cannot get pregnant anymore.  BMI 34.33 kg/m  BMI: Body mass index is 34.33 kg/m. Well nourished, well developed, in no acute distress  HEENT: normocephalic, atraumatic  Neck: no JVD, carotid bruits or masses Cardiac:  RRR; no significant murmurs, no rubs, or gallops Lungs:  CTA b/l, no wheezing, rhonchi or rales  Abd: soft, nontender MS: no deformity or atrophy Ext:  no edema  Skin: warm and dry, no rash Neuro:  No gross deficits appreciated Psych: euthymic mood, full affect     EKG:  05/18/17 SR, 82bpm, no ischemic looking changes  01/09/17: TTE Study Conclusions - Left ventricle: The cavity size was normal. Wall thickness was   normal. Systolic function was normal. The estimated ejection   fraction was in the range of 55% to 60%. Wall motion was normal;   there were no regional wall motion abnormalities. Left   ventricular diastolic function parameters were normal. - Left atrium: The atrium was normal in size. - Tricuspid valve: There was trivial regurgitation. - Pulmonary arteries: PA peak pressure: 28 mm Hg (S). - Inferior vena cava: The vessel was normal in size. The   respirophasic diameter changes were in the normal range (>= 50%),   consistent with normal central venous pressure. Impressions: - Essentially normal study.  (no pericardial effusion was noted)  09/13/16: lexiscan stress myoview  Nuclear stress EF: 58%.  There was no ST segment deviation noted during stress.  The study is normal.  This is a low risk study.  The left ventricular ejection fraction is normal (55-65%).   Normal  pharmacologic nuclear stress test with no evidence of prior infarct or ischemia.   03/02/15: [facility], Dr. Ellyn Hack Coronary Anatomy:  Dominance: Right   Left Main: Very Large caliber vessel that trifurcates into the LAD, Ramus Intermedius, and Left Circumflex. Angiographically normal LAD: Large-caliber vessel with a very proximal large caliber High First Diagonal Branch. The LAD has mild diffuse tender 20% lesions but is relatively atrophic normal as it courses down around the apex perfusing the distal third of the inferoapex.   D1: Large caliber high branch with several distal branches. Mild possible luminal irregularities of less than 30%.   D2: Moderate caliber vessel that arises from the mid LAD. It does not cover large distribution but is angiographically normal.  Left Circumflex: Large-caliber, nondominant vessel that courses mostly as a large lateral bifurcating OM branch. The inferior branch is much larger than the superior branch. The distal vessel is tortuous and reaches down almost of the  inferoapex. Mild luminal irregularity. There is a very small AV groove branch..  Ramus intermedius: Large caliber vessel that courses is a high OM. It gives off a moderate sized branch from the mid vessel just after a eccentric tubular 30% lesion.. The daughter vessel and parent both reaches almost to the apex. They are somewhat tortuous, but relatively free of disease.   RCA: Large-caliber codominant vessel that gives rise to a significant RV marginal branch in the mid vessel. It bifurcates distally into the Right Posterior Descending Artery  (RPDA) and the Right Posterior AV Groove Branch (RPAV).  Angiographically normal.   RPDA: Large caliber vessel that reaches two thirds the way to the apex. Angiographically normal.   RPL Sysytem:The RPAV begins as a moderate large vessel that terminates as a moderate caliber posterolateral branch. Angiographically normal.   Recent Labs: 09/01/2016: TSH  1.32 05/18/2017: BUN 11; Creatinine, Ser 0.59; Hemoglobin 12.8; Platelets 352; Potassium 3.6; Sodium 136  09/01/2016: Cholesterol 157; HDL 39; LDL Cholesterol 76; Total CHOL/HDL Ratio 4.0; Triglycerides 211; VLDL 42   Estimated Creatinine Clearance: 99.9 mL/min (by C-G formula based on SCr of 0.59 mg/dL).   Wt Readings from Last 3 Encounters:  05/24/17 200 lb (90.7 kg)  12/25/16 207 lb (93.9 kg)  12/22/16 204 lb (92.5 kg)     Other studies reviewed: Additional studies/records reviewed today include: summarized above  ASSESSMENT AND PLAN:  1. CP     Very atypical sounding and unchanged for years despite extensive testing     No change from prior to or since her myxoma surgery.     Not felt to be coronary in etiology given no significant CAD by cath in 2016 and low risk myoview in 2017     Echo is essentially normal, no pericardial effusion, no recurrent myxoma described on her echo or CT     She has had CT chest with contrast that mention no significant vascular findings     May 2017 CT ?possible residual thymic tissue though not appreciated or described on September CT  recommend re-visiting with her PMD to discuss evaluation into potential non-cardiac causes, ? musculoskeletal, GI, pulmonary, notes have suggested stress/anxiety as an etiology as well?  2. Hx of LA myxoma resection 2016     Echo this year is OK  3. HTN     Looks good, no changes   Disposition: F/u with [name] in 6 months, sooner if needed.  Current medicines are reviewed at length with the patient today.  The patient did not have any concerns regarding medicines.  Haywood Lasso, PA-C 05/24/2017 10:18 AM     [facility] [place] [address] [phone] (office)  [phone] (fax)

## 2017-05-24 ENCOUNTER — Ambulatory Visit (INDEPENDENT_AMBULATORY_CARE_PROVIDER_SITE_OTHER): Payer: BLUE CROSS/BLUE SHIELD | Admitting: Physician Assistant

## 2017-05-24 ENCOUNTER — Encounter (INDEPENDENT_AMBULATORY_CARE_PROVIDER_SITE_OTHER): Payer: Self-pay

## 2017-05-24 VITALS — BP 118/78 | HR 60 | Ht 64.0 in | Wt 200.0 lb

## 2017-05-24 DIAGNOSIS — I10 Essential (primary) hypertension: Secondary | ICD-10-CM | POA: Diagnosis not present

## 2017-05-24 DIAGNOSIS — R0789 Other chest pain: Secondary | ICD-10-CM

## 2017-05-24 NOTE — Patient Instructions (Signed)
Medication Instructions:    Your physician recommends that you continue on your current medications as directed. Please refer to the Current Medication list given to you today.   If you need a refill on your cardiac medications before your next appointment, please call your pharmacy.  Labwork: NONE ORDERED  TODAY    Testing/Procedures: NONE ORDERED  TODAY    Follow-Up:  Your physician wants you to follow-up in:  IN  [place] will receive a reminder letter in the mail two months in advance. If you don't receive a letter, please call our office to schedule the follow-up appointment.      Any Other Special Instructions Will Be Listed Below (If Applicable).   PLEASE FOLLOW UP WITH PRIMARY DOCTOR FOR SYMPTOMS

## 2017-06-28 ENCOUNTER — Encounter: Payer: Self-pay | Admitting: Student in an Organized Health Care Education/Training Program

## 2017-06-28 ENCOUNTER — Ambulatory Visit (INDEPENDENT_AMBULATORY_CARE_PROVIDER_SITE_OTHER): Payer: BLUE CROSS/BLUE SHIELD | Admitting: Student in an Organized Health Care Education/Training Program

## 2017-06-28 DIAGNOSIS — N926 Irregular menstruation, unspecified: Secondary | ICD-10-CM | POA: Diagnosis not present

## 2017-06-28 DIAGNOSIS — D72829 Elevated white blood cell count, unspecified: Secondary | ICD-10-CM | POA: Diagnosis not present

## 2017-06-28 MED ORDER — FERROUS SULFATE 325 (65 FE) MG PO TBEC: 325.0000 mg | DELAYED_RELEASE_TABLET | Freq: Every day | ORAL | 0 refills | Status: DC

## 2017-06-28 MED ORDER — DOCUSATE SODIUM 50 MG PO CAPS: 50.0000 mg | ORAL_CAPSULE | Freq: Every day | ORAL | 0 refills | Status: DC

## 2017-06-28 NOTE — Patient Instructions (Signed)
It was a pleasure seeing you today in our clinic. Today we discussed your tiredness and the results from your visit to see the blood doctor last year. Here is the treatment plan we have discussed and agreed upon together: - your irregular periods may be contributing to your tiredness - as we discussed, your hemoglobin was normal on June 1 - I sent iron tablets and a stool softener to your pharmacy which you may take because you're bleeding every two weeks as you go through menopause  Our clinic's number is [phone]. Please call with questions or concerns about what we discussed today.  Be well, Dr. Burr Medico

## 2017-06-28 NOTE — Progress Notes (Signed)
CC: concern about previously diagnosed leukocytosis  HPI: Debra Diaz is a 41 y.o. female with PMH significant for T2DM (last A1c 6.1 on 12/22/16) who presents to [facility] today with with fatigue. Interview was performed with aid of stratus interpreter.  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 Per office note at that time, sites of inflammation were discussed and weight loss for improvement in arthritis as well as decreasing red meat consumption were discussed to decrease systemic inflammation - patient has hx left atrial myxoma (resected) and tumor was discussed as source of elevated WBC per 11/22 onc office note  Today patient reports she does not recall the results of the above visits and has been chronicall concerned that she may have cancer. She does not remember the peripheral smear being reviewed with her. She denies fevers/weightloss/night sweats.  Irregular menses/menopause - 2 days ago menses stopped, had lasted 3-4 days - 6/29 menses lasted 3-4 days - she reports cycle has been irregular, sometimes every two weeks - CBC from 05/18/2017 indicates hgb WNL at 12.8 - peripheral smear noted hypochromic monocytic RBCs suggestive of iron deficiency  Review of Symptoms:  See HPI for ROS.   CC, SH/smoking status, and VS noted.  Objective: BP 104/64   Pulse 60   Temp 99.3 F (37.4 C) (Oral)   Ht 5\' 4"  (1.626 m)   Wt 202 lb 6.4 oz (91.8 kg)   LMP 06/24/2017 (Approximate)   SpO2 99%   BMI 34.74 kg/m  GEN: NAD, alert, cooperative, and pleasant. NECK: full ROM, no thyromegaly, no LAD RESPIRATORY: clear to auscultation bilaterally with  no wheezes, rhonchi or rales, good effort CV: +bradycardia with regular rhythm, no m/r/g, no peripheral edema GI: soft, non-tender, non-distended, no hepatosplenomegaly SKIN: warm and dry, no rashes or lesions NEURO: II-XII grossly intact, normal gait, peripheral sensation intact PSYCH: AAOx3, appropriate affect  Path Review SEE NOTE   Comment: Leukocytosis. Myeloid population consists predominantly of mature  segmented neutrophils with reactive changes. A few lymphocytes appear  reactive. No immature cells are identified. RBC's appear to be  microcytic and hypochromic on smear review. Suggest evaluation for  iron deficiency, if clinically indicated. Platelets are unremarkable.  Reviewed by Francis Gaines Mammarappallil MD (Electronic Signature on File)  10/04/2016      Leukocytosis - patient previously seen by oncology as noted above, peripheral smear reviewed, no abnormal cells - patient reassured of the results of her oncology and peripheral smear - possible that these results were lost in translation or she forgot over time - no B-type symptoms or changes to indicate further testing at this time - patient was reassured by this discussion after we reviewed her EMR together  Irregular menses - likely perimenopause, consider TSH at next office visit - iron/colace script given, patient understands that she was not anemic at last CBC but hgb was on low side and peripheral smear showed RBCs microcytic and hypochromic on smear review   No orders of the defined types were placed in this encounter.   Meds ordered this encounter  Medications  . ferrous sulfate (FE TABS) 325 (65 FE) MG EC tablet    Sig: Take  1 tablet (325 mg total) by mouth daily.    Dispense:  90 tablet    Refill:  0  . docusate sodium (COLACE) 50 MG capsule    Sig: Take 1 capsule (50 mg total) by mouth daily.    Dispense:  30 capsule    Refill:  0     Everrett Coombe, MD,MS,  PGY2 07/02/2017 6:09 AM

## 2017-07-02 DIAGNOSIS — N926 Irregular menstruation, unspecified: Secondary | ICD-10-CM | POA: Insufficient documentation

## 2017-07-02 HISTORY — DX: Irregular menstruation, unspecified: N92.6

## 2017-07-02 NOTE — Assessment & Plan Note (Signed)
-   likely perimenopause, consider TSH at next office visit - iron/colace script given, patient understands that she was not anemic at last CBC but hgb was on low side and peripheral smear showed RBCs microcytic and hypochromic on smear review

## 2017-07-02 NOTE — Assessment & Plan Note (Signed)
-   patient previously seen by oncology as noted above, peripheral smear reviewed, no abnormal cells - patient reassured of the results of her oncology and peripheral smear - possible that these results were lost in translation or she forgot over time - no B-type symptoms or changes to indicate further testing at this time - patient was reassured by this discussion after we reviewed her EMR together

## 2017-08-07 ENCOUNTER — Other Ambulatory Visit: Payer: Self-pay | Admitting: Family Medicine
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
## 2017-09-06 ENCOUNTER — Other Ambulatory Visit: Payer: Self-pay | Admitting: *Deleted
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
## 2017-09-07 MED ORDER — METFORMIN HCL 500 MG PO TABS: 500.0000 mg | ORAL_TABLET | Freq: Every day | ORAL | 0 refills | Status: DC

## 2017-09-07 NOTE — Telephone Encounter (Signed)
Will refill metformin. Please call patient and ask her to come in for an office visit with me so we can get an A1c.

## 2017-09-12 NOTE — Telephone Encounter (Signed)
Pt has appointment on 09/13/17 to discuss this. Katharina Caper, Sherryl Valido D, [place]

## 2017-09-13 ENCOUNTER — Ambulatory Visit: Payer: BLUE CROSS/BLUE SHIELD | Admitting: Family Medicine

## 2017-09-13 NOTE — Progress Notes (Deleted)
Subjective:    Patient ID: Debra Diaz , female   DOB: 10-08-1976 , 41 y.o..   MRN: [id number]  HPI  Debra Diaz is a 41 yo F with PMH of HTN, concussion,T2DM, obesity, anxiety, resected atrial myxoma, CP, leukocytosis here for No chief complaint on file.   1. Hypertension Blood pressure at home: *** Exercise: *** Low salt diet: *** Medications: Compliant with *** Side effects: *** ROS: Denies headache, dizziness, visual changes, nausea, vomiting, chest pain, abdominal pain or shortness of breath. BP Readings from Last 3 Encounters:  06/28/17 104/64  05/24/17 118/78  05/18/17 109/69    2. Chronic Diabetes  Disease Monitoring  Blood Sugar Ranges: ***  Polyuria: {YES/NO/WILD CARDS:18581}   Visual problems: {YES/NO/WILD CARDS:18581}   Last hemoglobin A1C:  Lab Results  Component Value Date   HGBA1C 6.1 12/22/2016    Medication Compliance: {YES/NO/WILD CARDS:18581}  Medication Side Effects  Hypoglycemia: {YES/NO/WILD SAYTK:16010}   Preventitive Health Care  Eye Exam: ***  Foot Exam: ***  Diet pattern: ***  Exercise: ***   Review of Systems: Per HPI. All other systems reviewed and are negative.  Health Maintenance Due  Topic Date Due  . PNEUMOCOCCAL POLYSACCHARIDE VACCINE (1) 04/04/1977  . FOOT EXAM  04/04/1985  . OPHTHALMOLOGY EXAM  04/04/1985  . HIV Screening  04/04/1990  . PAP SMEAR  04/04/1996  . HEMOGLOBIN A1C  06/21/2017  . INFLUENZA VACCINE  07/18/2017    Past Medical History: Patient Active Problem List   Diagnosis Date Noted  . Irregular menses 07/02/2017  . Leukocytosis 10/03/2016  . Anxiety reaction 08/15/2016  . Healthcare maintenance 06/12/2016  . Concussion 05/05/2016  . 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 Morbid obesity ([place])   . Chest pain with high risk of acute coronary syndrome 02/27/2015  . Elevated troponin I level 02/26/2015  . HTN (hypertension) 02/26/2015  . DM2 (diabetes  mellitus, type 2) ([place]) 02/26/2015    Medications: reviewed and updated Current Outpatient Prescriptions  Medication Sig Dispense Refill  . docusate sodium (COLACE) 50 MG capsule Take 1 capsule (50 mg total) by mouth daily. 30 capsule 0  . ferrous sulfate (FE TABS) 325 (65 FE) MG EC tablet Take 1 tablet (325 mg total) by mouth daily. 90 tablet 0  . ibuprofen (ADVIL,MOTRIN) 800 MG tablet TAKE 1 TABLET BY MOUTH EVERY 8 HOURS AS NEEDED 60 tablet 0  . lisinopril-hydrochlorothiazide (PRINZIDE,ZESTORETIC) 20-25 MG tablet TAKE 1 TABLET BY MOUTH EVERY DAY 30 tablet 0  . metFORMIN (GLUCOPHAGE) 500 MG tablet Take 1 tablet (500 mg total) by mouth daily with breakfast. 30 tablet 0   No current facility-administered medications for this visit.     Social Hx:  reports that she has never smoked. She has never used smokeless tobacco.   Objective:   There were no vitals taken for this visit. Physical Exam  Gen: NAD, alert, cooperative with exam, well-appearing HEENT: NCAT, PERRL, clear conjunctiva, oropharynx clear, supple neck Cardiac: Regular rate and rhythm, normal S1/S2, no murmur, no edema, capillary refill brisk  Respiratory: Clear to auscultation bilaterally, no wheezes, non-labored breathing Gastrointestinal: soft, non tender, non distended, bowel sounds present Skin: no rashes, normal turgor  Neurological: no gross deficits.  Psych: good insight, normal mood and affect  Assessment & Plan:  No problem-specific Assessment & Plan notes found for this encounter.  No orders of the defined types were placed in this encounter.  No orders of the defined types were placed  in this encounter.   Smitty Cords, MD [place], PGY-3

## 2017-09-14 ENCOUNTER — Other Ambulatory Visit: Payer: Self-pay | Admitting: *Deleted

## 2017-09-14 MED ORDER — LISINOPRIL-HYDROCHLOROTHIAZIDE 20-25 MG PO TABS: 1.0000 | ORAL_TABLET | Freq: Every day | ORAL | 0 refills | Status: DC

## 2017-09-14 MED ORDER — METFORMIN HCL 500 MG PO TABS: 500.0000 mg | ORAL_TABLET | Freq: Every day | ORAL | 0 refills | Status: DC

## 2017-10-02 ENCOUNTER — Encounter: Payer: Self-pay | Admitting: Student in an Organized Health Care Education/Training Program

## 2017-10-02 ENCOUNTER — Ambulatory Visit (INDEPENDENT_AMBULATORY_CARE_PROVIDER_SITE_OTHER): Payer: BLUE CROSS/BLUE SHIELD | Admitting: Student in an Organized Health Care Education/Training Program

## 2017-10-02 VITALS — BP 102/66 | HR 64 | Temp 99.0°F | Ht 64.0 in | Wt 202.0 lb

## 2017-10-02 DIAGNOSIS — R51 Headache: Secondary | ICD-10-CM

## 2017-10-02 DIAGNOSIS — E119 Type 2 diabetes mellitus without complications: Secondary | ICD-10-CM | POA: Diagnosis not present

## 2017-10-02 DIAGNOSIS — N644 Mastodynia: Secondary | ICD-10-CM | POA: Diagnosis not present

## 2017-10-02 DIAGNOSIS — Z23 Encounter for immunization: Secondary | ICD-10-CM | POA: Diagnosis not present

## 2017-10-02 DIAGNOSIS — R519 Headache, unspecified: Secondary | ICD-10-CM

## 2017-10-02 LAB — POCT GLYCOSYLATED HEMOGLOBIN (HGB A1C): Hemoglobin A1C: 6

## 2017-10-02 NOTE — Progress Notes (Signed)
   CC: Tender breast lump  HPI: Debra Diaz is a 41 y.o. female with PMH significant for HTN, T2DM, obesity, who presents to [facility] today with a tender breast lump of two weeks duration.   Breast lump - R side - 2 weeks duration - not warm - always there, does not wax and wane - has not checked for fevers - continues to have regular menses. Last menses was 3 days ago.   Headache Onset: 1 month   Location: mid-head Quality: sharp pain Frequency: Lasts 3-4 hours hours, 2-3 times per week Precipitating factors: stress from recent divorce with plenty of issues  Prior treatment: Ibuprofen  Associated Symptoms Nausea/vomiting: no  Photophobia/phonophobia: yes  Tearing of eyes: no  Sinus pain/pressure: no  Family hx migraine: yes  Personal stressors: yes  Relation to menstrual cycle: no   Red Flags Fever: no  Neck pain/stiffness: no  Vision/speech/swallow/hearing difficulty: no  Focal weakness/numbness: no  Altered mental status: no  Trauma: no  New type of headache: no  Anticoagulant use: no  H/o cancer/HIV/Pregnancy: no   Diabetes Patient believes headaches may be related to elevated blood sugars, stated she saw one elevated sugar at 225. Does not check sugars regularly. Feels her sugars have been uncontrolled with recent life stressors/divorce. Asks for A1c to be checked at today's visit. No polyphagia, polyuria, polydipsia. No vision changes. No peripheral pain/neuropathic pain.  Review of Symptoms:  See HPI for ROS.   CC, SH/smoking status, and VS noted.  Objective: BP 102/66 (BP Location: Right Arm, Patient Position: Sitting, Cuff Size: Normal)   Pulse 64   Temp 99 F (37.2 C) (Oral)   Ht 5\' 4"  (1.626 m)   Wt 202 lb (91.6 kg)   SpO2 98%   BMI 34.67 kg/m  GEN: NAD, alert, cooperative, and pleasant. NEURO: CN II-XII intact, 5/5 muscle strength in 4 extremities, normal gait RESPIRATORY: clear to auscultation bilaterally with no wheezes, rhonchi or  rales, good effort CV: RRR, no m/r/g, no peripheral edema PSYCH: AAOx3, appropriate affect BREAST: appear normal, no suspicious masses, no skin or nipple changes or axillary nodes. On right medial areola there is a small area of tenderness, I am unable to palpate any bump in that area. There is surrounding nodularity which feels normal. There is no warmth, redness or drainage.   Assessment and plan:  Breast pain, right I do not feel a mass, however there is a small area of tenderness and patient states she thinks there is a bump there.  May be a blocked duct. No suspicion for infection at this time. - warm compresses 10 mins TID - return precautions provided  DM2 (diabetes mellitus, type 2) Has previously been very well controlled, patient is concerned sugars have been higher recently with life stressors and would like A1c checked. - will draw today and call patient with results  Nonintractable episodic headache May be tension headache, stress-related with patient's recent divorce. No red flags at this time.  - tylenol, ibuprofen for headache - avoid taking ibuprofen daily - return precautions reviewed   Orders Placed This Encounter  Procedures  . Flu Vaccine QUAD 36+ mos IM  . HgB A1c    No orders of the defined types were placed in this encounter.    Everrett Coombe, MD,MS,  PGY2 10/03/2017 8:09 AM

## 2017-10-02 NOTE — Patient Instructions (Addendum)
It was a pleasure seeing you today in our clinic. Today we discussed your headaches, diabetes, and breast lump. Here is the treatment plan we have discussed and agreed upon together:  Breast Lump: Warm compresses three times per day for ten minutes. If you notice warmth, redness, or fevers please come back.  Headaches: Please take tylenol for headaches. Do not take ibuprofen more than 2-3 times per week. Stay well hydrated. If headaches persist, please come back to see me.  Our clinic's number is [phone]. Please call with questions or concerns about what we discussed today.  Be well, Dr. Burr Medico

## 2017-10-03 DIAGNOSIS — N644 Mastodynia: Secondary | ICD-10-CM | POA: Insufficient documentation

## 2017-10-03 HISTORY — DX: Mastodynia: N64.4

## 2017-10-03 NOTE — Assessment & Plan Note (Addendum)
I do not feel a mass, however there is a small area of tenderness and patient states she thinks there is a bump there.  May be a blocked duct. No suspicion for infection at this time. - warm compresses 10 mins TID - return precautions provided

## 2017-10-03 NOTE — Assessment & Plan Note (Signed)
May be tension headache, stress-related with patient's recent divorce. No red flags at this time.  - tylenol, ibuprofen for headache - avoid taking ibuprofen daily - return precautions reviewed

## 2017-10-03 NOTE — Assessment & Plan Note (Signed)
Has previously been very well controlled, patient is concerned sugars have been higher recently with life stressors and would like A1c checked. - will draw today and call patient with results

## 2017-10-04 ENCOUNTER — Telehealth: Payer: Self-pay

## 2017-10-04 NOTE — Telephone Encounter (Signed)
-----   Message from Everrett Coombe, MD sent at 10/03/2017 10:25 AM EDT ----- Please call and tell patient that her A1c test for diabetes was very well controlled at 6.0. We do not need to make any changes to her medications.  ----- Message ----- From: [place] Pink, CMA Sent: 10/02/2017   3:36 PM To: Everrett Coombe, MD

## 2017-10-04 NOTE — Telephone Encounter (Signed)
Called pt using [employer] [place]). LVM for pt to call the office. If she calls, please give her the information below. Ottis Stain, CMA

## 2017-12-26 ENCOUNTER — Other Ambulatory Visit: Payer: Self-pay | Admitting: Student in an Organized Health Care Education/Training Program

## 2018-01-09 ENCOUNTER — Other Ambulatory Visit: Payer: Self-pay

## 2018-01-09 ENCOUNTER — Ambulatory Visit: Payer: BLUE CROSS/BLUE SHIELD | Admitting: Student in an Organized Health Care Education/Training Program

## 2018-01-09 ENCOUNTER — Encounter: Payer: Self-pay | Admitting: Student in an Organized Health Care Education/Training Program

## 2018-01-09 VITALS — BP 100/58 | HR 54 | Temp 98.4°F | Ht 64.0 in | Wt 200.2 lb

## 2018-01-09 DIAGNOSIS — K5901 Slow transit constipation: Secondary | ICD-10-CM

## 2018-01-09 DIAGNOSIS — K5900 Constipation, unspecified: Secondary | ICD-10-CM | POA: Insufficient documentation

## 2018-01-09 DIAGNOSIS — Z0000 Encounter for general adult medical examination without abnormal findings: Secondary | ICD-10-CM

## 2018-01-09 MED ORDER — LISINOPRIL-HYDROCHLOROTHIAZIDE 20-25 MG PO TABS: 1.0000 | ORAL_TABLET | Freq: Every day | ORAL | 1 refills | Status: DC

## 2018-01-09 MED ORDER — METFORMIN HCL 500 MG PO TABS: ORAL_TABLET | ORAL | 3 refills | Status: DC

## 2018-01-09 MED ORDER — POLYETHYLENE GLYCOL 3350 17 GM/SCOOP PO POWD: 17.0000 g | Freq: Two times a day (BID) | ORAL | 1 refills | Status: DC | PRN

## 2018-01-09 NOTE — Patient Instructions (Signed)
It was a pleasure seeing you today in our clinic.   Please titrate miralax as discussed until you are having 1 or 2 soft bowel movements per day. If you get diarrhea, decrease the amount of miralax.  Please call to schedule a mammogram using the number from the sheet provided.  Our clinic's number is [phone]. Please call with questions or concerns about what we discussed today.  Be well, Dr. Burr Medico

## 2018-01-09 NOTE — Progress Notes (Signed)
   CC: Constipation  HPI: Debra RENNAKER is a 42 y.o. female with PMH significant for diabetes and HTN who presents to [facility] today with constipation.  Constipation Patient reports she sometimes goes 2-3 days without having a bowel movement. She endorses hard stools, no hematochezia or melena. She reports BM are not painful. She endorses BMs are tiny balls of stool. No abdominal pain.   Overdue health maintenance Mammogram form provided today. Patient reports she accidentally forgot to go to the last one that was scheduled.  Review of Symptoms:  See HPI for ROS.   CC, SH/smoking status, and VS noted.  Objective: BP (!) 100/58   Pulse (!) 54   Temp 98.4 F (36.9 C) (Oral)   Ht 5\' 4"  (1.626 m)   Wt 200 lb 3.2 oz (90.8 kg)   LMP 12/14/2017 (Exact Date)   SpO2 98%   BMI 34.36 kg/m  GEN: NAD, alert, cooperative, and pleasant. RESPIRATORY: clear to auscultation bilaterally with no wheezes, rhonchi or rales, good effort CV: RRR, no m/r/g GI: soft, non-tender, non-distended, no hepatosplenomegaly SKIN: warm and dry, no rashes or lesions NEURO: II-XII grossly intact PSYCH: AAOx3, appropriate affect  Assessment and plan:  Constipation No red flag symptoms. Will start miralax, titrated to 1-2 soft BM per day. Follow up as needed.  Refills Asked for refills on metformin and HTN meds, which were ordered.  Orders Placed This Encounter  Procedures  . MM Digital Screening    Standing Status:   Future    Standing Expiration Date:   03/10/2019    Order Specific Question:   Reason for Exam (SYMPTOM  OR DIAGNOSIS REQUIRED)    Answer:   Screening for cancer    Order Specific Question:   Is the patient pregnant?    Answer:   No    Order Specific Question:   Preferred imaging location?    Answer:   [facility]    Meds ordered this encounter  Medications  . polyethylene glycol powder (GLYCOLAX/MIRALAX) powder    Sig: Take 17 g by mouth 2 (two) times daily as needed for mild  constipation or moderate constipation.    Dispense:  3350 g    Refill:  1  . metFORMIN (GLUCOPHAGE) 500 MG tablet    Sig: TAKE 1 TABLET(500 MG) BY MOUTH DAILY WITH BREAKFAST    Dispense:  90 tablet    Refill:  3  . lisinopril-hydrochlorothiazide (PRINZIDE,ZESTORETIC) 20-25 MG tablet    Sig: Take 1 tablet by mouth daily.    Dispense:  90 tablet    Refill:  1    Everrett Coombe, MD,MS,  PGY2 01/09/2018 10:57 AM

## 2018-01-09 NOTE — Assessment & Plan Note (Signed)
No red flag symptoms. Will start miralax, titrated to 1-2 soft BM per day. Follow up as needed.

## 2018-01-16 ENCOUNTER — Telehealth: Payer: Self-pay | Admitting: *Deleted

## 2018-01-16 NOTE — Telephone Encounter (Signed)
PA started in covermymeds.  Will check back in 48 - 72 hours. Severino Paolo, Salome Spotted, CMA

## 2018-01-17 NOTE — Telephone Encounter (Signed)
Per covermymeds, PA not needed. Ted Goodner, Salome Spotted, CMA

## 2018-02-11 ENCOUNTER — Telehealth: Payer: Self-pay | Admitting: Student in an Organized Health Care Education/Training Program

## 2018-02-11 DIAGNOSIS — N644 Mastodynia: Secondary | ICD-10-CM

## 2018-02-11 NOTE — Telephone Encounter (Signed)
Pt called [facility] for a mammogram but was told she needed a referral. Please advise

## 2018-02-11 NOTE — Telephone Encounter (Signed)
When pt called to schedule, she told them she was having breast pain (see visit from 10/02/17).  This means what we need to add the ultrasound as well.  WIll forward to MD. Fleeger, Debra Diaz, Debra Diaz

## 2018-02-13 ENCOUNTER — Other Ambulatory Visit: Payer: Self-pay | Admitting: Student in an Organized Health Care Education/Training Program

## 2018-02-13 DIAGNOSIS — N644 Mastodynia: Secondary | ICD-10-CM

## 2018-02-13 NOTE — Telephone Encounter (Signed)
Hi Jess,  I put in the order for an ultrasound as well, is there anything else I should do for her?  Thank you!

## 2018-02-14 NOTE — Addendum Note (Signed)
Addended by: Christen Bame D on: 02/14/2018 10:48 AM   Modules accepted: Orders

## 2018-02-14 NOTE — Telephone Encounter (Signed)
I had to change the order, so you will need to cosign that.  But other than that we should be good to go.  I called patient and informed her. Fleeger, Salome Spotted, CMA

## 2018-02-15 ENCOUNTER — Other Ambulatory Visit: Payer: Self-pay | Admitting: Student in an Organized Health Care Education/Training Program

## 2018-02-15 DIAGNOSIS — N644 Mastodynia: Secondary | ICD-10-CM

## 2018-02-20 ENCOUNTER — Ambulatory Visit
Admission: RE | Admit: 2018-02-20 | Discharge: 2018-02-20 | Disposition: A | Discharge status: Home / Self Care | Payer: BLUE CROSS/BLUE SHIELD | Source: Ambulatory Visit | Attending: Family Medicine | Admitting: Family Medicine

## 2018-02-20 ENCOUNTER — Ambulatory Visit: Payer: BLUE CROSS/BLUE SHIELD

## 2018-02-20 DIAGNOSIS — N644 Mastodynia: Secondary | ICD-10-CM

## 2018-03-31 ENCOUNTER — Other Ambulatory Visit: Payer: Self-pay | Admitting: Student in an Organized Health Care Education/Training Program

## 2018-04-01 ENCOUNTER — Other Ambulatory Visit: Payer: Self-pay

## 2018-04-01 ENCOUNTER — Emergency Department (HOSPITAL_COMMUNITY): Payer: BLUE CROSS/BLUE SHIELD

## 2018-04-01 ENCOUNTER — Emergency Department (HOSPITAL_COMMUNITY)
Admission: EM | Admit: 2018-04-01 | Discharge: 2018-04-01 | Disposition: A | Discharge status: Home / Self Care | Payer: BLUE CROSS/BLUE SHIELD | Attending: Emergency Medicine | Admitting: Emergency Medicine

## 2018-04-01 ENCOUNTER — Encounter (HOSPITAL_COMMUNITY): Payer: Self-pay | Admitting: Emergency Medicine

## 2018-04-01 DIAGNOSIS — R072 Precordial pain: Secondary | ICD-10-CM | POA: Insufficient documentation

## 2018-04-01 DIAGNOSIS — E119 Type 2 diabetes mellitus without complications: Secondary | ICD-10-CM | POA: Diagnosis not present

## 2018-04-01 DIAGNOSIS — Z87891 Personal history of nicotine dependence: Secondary | ICD-10-CM | POA: Diagnosis not present

## 2018-04-01 DIAGNOSIS — R079 Chest pain, unspecified: Secondary | ICD-10-CM | POA: Diagnosis present

## 2018-04-01 DIAGNOSIS — Z7984 Long term (current) use of oral hypoglycemic drugs: Secondary | ICD-10-CM | POA: Diagnosis not present

## 2018-04-01 DIAGNOSIS — Z79899 Other long term (current) drug therapy: Secondary | ICD-10-CM | POA: Diagnosis not present

## 2018-04-01 DIAGNOSIS — R51 Headache: Secondary | ICD-10-CM | POA: Diagnosis not present

## 2018-04-01 DIAGNOSIS — R519 Headache, unspecified: Secondary | ICD-10-CM

## 2018-04-01 LAB — CBC
HCT: 39.8 % (ref 36.0–46.0)
Hemoglobin: 12.2 g/dL (ref 12.0–15.0)
MCH: 24.3 pg — ABNORMAL LOW (ref 26.0–34.0)
MCHC: 30.7 g/dL (ref 30.0–36.0)
MCV: 79.3 fL (ref 78.0–100.0)
Platelets: 356 10*3/uL (ref 150–400)
RBC: 5.02 MIL/uL (ref 3.87–5.11)
RDW: 16.9 % — ABNORMAL HIGH (ref 11.5–15.5)
WBC: 9.1 10*3/uL (ref 4.0–10.5)

## 2018-04-01 LAB — BASIC METABOLIC PANEL
Anion gap: 10 (ref 5–15)
BUN: 11 mg/dL (ref 6–20)
CO2: 24 mmol/L (ref 22–32)
Calcium: 8.6 mg/dL — ABNORMAL LOW (ref 8.9–10.3)
Chloride: 103 mmol/L (ref 101–111)
Creatinine, Ser: 0.58 mg/dL (ref 0.44–1.00)
GFR calc Af Amer: >60 mL/min (ref >60)
GFR calc non Af Amer: >60 mL/min (ref >60)
Glucose, Bld: 103 mg/dL — ABNORMAL HIGH (ref 65–99)
Potassium: 3.7 mmol/L (ref 3.5–5.1)
Sodium: 137 mmol/L (ref 135–145)

## 2018-04-01 LAB — I-STAT BETA HCG BLOOD, ED (MC, WL, AP ONLY): I-stat hCG, quantitative: 7.8 m[IU]/mL — ABNORMAL HIGH (ref <5)

## 2018-04-01 LAB — I-STAT TROPONIN, ED: Troponin i, poc: 0 ng/mL (ref 0.00–0.08)

## 2018-04-01 MED ORDER — IBUPROFEN 400 MG PO TABS
400.0000 mg | ORAL_TABLET | Freq: Once | ORAL | Status: AC
  Administered 2018-04-01: 400 mg via ORAL
  Filled 2018-04-01: qty 1

## 2018-04-01 MED ORDER — TRAMADOL HCL 50 MG PO TABS
50.0000 mg | ORAL_TABLET | Freq: Once | ORAL | Status: AC
  Administered 2018-04-01: 50 mg via ORAL
  Filled 2018-04-01: qty 1

## 2018-04-01 NOTE — Discharge Instructions (Addendum)
It was our pleasure to provide your ER care today - we hope that you feel better.  Take acetaminophen and/or ibuprofen as need for pain.   Follow up with primary care doctor in the next 1-2 days for recheck.  Return to ER if worse, new symptoms, fevers, trouble breathing, recurrent/persistent chest pain, severe head pain, other concern.   You were given pain medication in the ER - no driving for the next 4 hours.

## 2018-04-01 NOTE — ED Notes (Signed)
Pt reports chest pain radiating to her left arm described as numbness. Sinus brady on the monitor. Pt also endorses headache. AOX4. Resp e/u.

## 2018-04-01 NOTE — ED Triage Notes (Signed)
Pt states yesterday she started having cp while at church last night pt also reports heaviness in left arm with a headache.

## 2018-04-01 NOTE — ED Notes (Signed)
Patient verbalized understanding of discharge instructions and denies any further needs or questions at this time. VS stable. Patient ambulatory with steady gait.  

## 2018-04-01 NOTE — ED Provider Notes (Signed)
[facility] EMERGENCY DEPARTMENT Provider Note   CSN: [id number] Arrival date & time: 04/01/18  3016     History   Chief Complaint Chief Complaint  Patient presents with  . Chest Pain    HPI Debra Diaz is a 42 y.o. female.  Patient c/o mid chest pain onset yesterday. Pain dull, mild-mod, occurs at rest, non radiating, constant. No relation to activity or exertion, not pleuritic. Denies heartburn. No associated nv, diaphoresis or sob. No unusual doe. Also c/o dull headache today. Moderate, diffuse. Similar to prior headaches. Gradual onset. No eye pain or change in vision. No numbness or weakness. No change in speech. No change in normal functional ability. No head injury, trauma or fall. No syncope. Denies fever or chills. No cough or uri symptoms. Denies leg pain or swelling.   The history is provided by the patient.  Chest Pain   Associated symptoms include headaches. Pertinent negatives include no abdominal pain, no cough, no fever, no numbness, no shortness of breath, no vomiting and no weakness.    Past Medical History:  Diagnosis Date  . Anemia   . Diabetes mellitus without complication ([place])   . Hypertension   . Morbid obesity ([place])   . 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 Constipation 01/09/2018  . Breast pain, right 10/03/2017  . Irregular menses 07/02/2017  . Leukocytosis 10/03/2016  . Anxiety reaction 08/15/2016  . Healthcare maintenance 06/12/2016  . Concussion 05/05/2016  . 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 Morbid obesity ([place])   . Chest pain with high risk of acute coronary syndrome 02/27/2015  . Nonintractable episodic headache 02/26/2015  . Elevated troponin I level 02/26/2015  . HTN (hypertension) 02/26/2015  . DM2 (diabetes mellitus, type 2) ([place]) 02/26/2015    Past Surgical History:  Procedure  Laterality Date  . [place] and 2011   x 2  . LEFT HEART CATHETERIZATION WITH CORONARY ANGIOGRAM N/A 03/02/2015   Procedure: LEFT HEART CATHETERIZATION WITH CORONARY ANGIOGRAM;  Surgeon: [name], MD;  Location: [facility] CATH LAB;  Service: Cardiovascular;  Laterality: N/A;  . MINIMALLY INVASIVE EXCISION OF ATRIAL MYXOMA N/A 03/04/2015   Procedure: MINIMALLY INVASIVE RESECTION OF LEFT ATRIAL MYXOMA ( USING A BILAYER PATCH CLOSURE);  Surgeon: [name], MD;  Location: [place];  Service: Open Heart Surgery;  Laterality: N/A;  . myxoma N/A    chest  . TEE WITHOUT CARDIOVERSION N/A 03/01/2015   Procedure: TRANSESOPHAGEAL ECHOCARDIOGRAM (TEE);  Surgeon: [name], MD;  Location: [facility] ENDOSCOPY;  Service: Cardiovascular;  Laterality: N/A;  . TEE WITHOUT CARDIOVERSION N/A 03/04/2015   Procedure: TRANSESOPHAGEAL ECHOCARDIOGRAM (TEE);  Surgeon: [name], MD;  Location: [place];  Service: Open Heart Surgery;  Laterality: N/A;  . TUBAL LIGATION  2011     OB History   None      Home Medications    Prior to Admission medications   Medication Sig Start Date End Date Taking? Authorizing Provider  ferrous sulfate (FE TABS) 325 (65 FE) MG EC tablet Take 1 tablet (325 mg total) by mouth daily. 06/28/17   Everrett Coombe, MD  ibuprofen (ADVIL,MOTRIN) 800 MG tablet TAKE 1 TABLET BY MOUTH EVERY 8 HOURS AS NEEDED 08/08/17   Everrett Coombe, MD  lisinopril-hydrochlorothiazide (PRINZIDE,ZESTORETIC) 20-25 MG tablet Take 1 tablet by mouth daily. 01/09/18   Everrett Coombe,  MD  metFORMIN (GLUCOPHAGE) 500 MG tablet TAKE 1 TABLET(500 MG) BY MOUTH DAILY WITH BREAKFAST 01/09/18   Everrett Coombe, MD  polyethylene glycol powder (GLYCOLAX/MIRALAX) powder Take 17 g by mouth 2 (two) times daily as needed for mild constipation or moderate constipation. 01/09/18   Everrett Coombe, MD    Family History Family History  Problem Relation Age of Onset  . Hypertension Mother   . Diabetes Father   . Asthma Son   .  Breast cancer Maternal Grandmother     Social History Social History   Tobacco Use  . Smoking status: Former Smoker    Last attempt to quit: 11/17/2017    Years since quitting: 0.3  . Smokeless tobacco: Never Used  Substance Use Topics  . Alcohol use: Yes    Comment: occasional  . Drug use: No     Allergies   Patient has no known allergies.   Review of Systems Review of Systems  Constitutional: Negative for fever.  HENT: Negative for sore throat.   Eyes: Negative for redness.  Respiratory: Negative for cough and shortness of breath.   Cardiovascular: Positive for chest pain. Negative for leg swelling.  Gastrointestinal: Negative for abdominal pain and vomiting.  Genitourinary: Negative for flank pain.  Musculoskeletal: Negative for neck pain and neck stiffness.  Skin: Negative for rash.  Neurological: Positive for headaches. Negative for syncope, speech difficulty, weakness and numbness.  Hematological: Does not bruise/bleed easily.  Psychiatric/Behavioral: Negative for confusion.     Physical Exam Updated Vital Signs BP (!) 150/84 (BP Location: Right Arm)   Pulse (!) 59   Temp 98.3 F (36.8 C) (Oral)   Resp 20   SpO2 100%   Physical Exam  Constitutional: She is oriented to person, place, and time. She appears well-developed and well-nourished. No distress.  HENT:  Head: Atraumatic.  Nose: Nose normal.  Mouth/Throat: Oropharynx is clear and moist.  No sinus or temporal tenderness.  Eyes: Pupils are equal, round, and reactive to light. Conjunctivae and EOM are normal. No scleral icterus.  Neck: Neck supple. No tracheal deviation present. No thyromegaly present.  No stiffness or rigidity.   Cardiovascular: Normal rate, regular rhythm, normal heart sounds and intact distal pulses. Exam reveals no gallop and no friction rub.  No murmur heard. Pulmonary/Chest: Effort normal and breath sounds normal. No respiratory distress. She exhibits tenderness.  Abdominal:  Soft. Normal appearance and bowel sounds are normal. She exhibits no distension. There is no tenderness.  Genitourinary:  Genitourinary Comments: No cva tenderness.  Musculoskeletal: Normal range of motion. She exhibits no edema or tenderness.  Neurological: She is alert and oriented to person, place, and time. No cranial nerve deficit.  Speech clear/fluent. Motor intact bilaterally. stre 5/5. No pronator drift. Steady gait.   Skin: Skin is warm and dry. No rash noted. She is not diaphoretic.  Psychiatric: She has a normal mood and affect.  Nursing note and vitals reviewed.    ED Treatments / Results  Labs (all labs ordered are listed, but only abnormal results are displayed) Results for orders placed or performed during the hospital encounter of 69/62/95  Basic metabolic panel  Result Value Ref Range   Sodium 137 135 - 145 mmol/L   Potassium 3.7 3.5 - 5.1 mmol/L   Chloride 103 101 - 111 mmol/L   CO2 24 22 - 32 mmol/L   Glucose, Bld 103 (H) 65 - 99 mg/dL   BUN 11 6 - 20 mg/dL   Creatinine, Ser 0.58  0.44 - 1.00 mg/dL   Calcium 8.6 (L) 8.9 - 10.3 mg/dL   GFR calc non Af Amer >60 >60 mL/min   GFR calc Af Amer >60 >60 mL/min   Anion gap 10 5 - 15  CBC  Result Value Ref Range   WBC 9.1 4.0 - 10.5 K/uL   RBC 5.02 3.87 - 5.11 MIL/uL   Hemoglobin 12.2 12.0 - 15.0 g/dL   HCT 39.8 36.0 - 46.0 %   MCV 79.3 78.0 - 100.0 fL   MCH 24.3 (L) 26.0 - 34.0 pg   MCHC 30.7 30.0 - 36.0 g/dL   RDW 16.9 (H) 11.5 - 15.5 %   Platelets 356 150 - 400 K/uL  I-stat troponin, ED  Result Value Ref Range   Troponin i, poc 0.00 0.00 - 0.08 ng/mL   Comment 3          I-Stat beta hCG blood, ED  Result Value Ref Range   I-stat hCG, quantitative 7.8 (H) <5 mIU/mL   Comment 3           Dg Chest 2 View  Result Date: 04/01/2018 CLINICAL DATA:  Chest pain EXAM: CHEST - 2 VIEW COMPARISON:  05/18/2017 FINDINGS: Heart and mediastinal contours are within normal limits. No focal opacities or effusions. No acute  bony abnormality. IMPRESSION: No active cardiopulmonary disease. Electronically Signed   By: Rolm Baptise M.D.   On: 04/01/2018 09:08    EKG EKG Interpretation  Date/Time:  Monday April 01 2018 08:23:42 EDT Ventricular Rate:  57 PR Interval:  136 QRS Duration: 72 QT Interval:  422 QTC Calculation: 410 R Axis:   4 Text Interpretation:  Sinus bradycardia Confirmed by Lajean Saver [phone]) on 04/01/2018 9:47:11 AM   Radiology Dg Chest 2 View  Result Date: 04/01/2018 CLINICAL DATA:  Chest pain EXAM: CHEST - 2 VIEW COMPARISON:  05/18/2017 FINDINGS: Heart and mediastinal contours are within normal limits. No focal opacities or effusions. No acute bony abnormality. IMPRESSION: No active cardiopulmonary disease. Electronically Signed   By: Rolm Baptise M.D.   On: 04/01/2018 09:08    Procedures Procedures (including critical care time)  Medications Ordered in ED Medications  ibuprofen (ADVIL,MOTRIN) tablet 400 mg (has no administration in time range)  traMADol (ULTRAM) tablet 50 mg (has no administration in time range)     Initial Impression / Assessment and Plan / ED Course  I have reviewed the triage vital signs and the nursing notes.  Pertinent labs & imaging results that were available during my care of the patient were reviewed by me and considered in my medical decision making (see chart for details).  Labs. Cxr. Ecg.  ecg reviewed - no acute st/t changes.  cxr reviewed - no pna.  After symptoms for past day - trop is 0/normal.  Reviewed nursing notes and prior charts for additional history. Pt with cardiac cath 2016 without significant cad.   Motrin po. Ultram po.  Po fluids. Awaiting additional lab results.  Labs reviewed  - trop normal. Cbc c/w baseline.   Recheck, pt comfortable, and in nad.   Patient currently appears stable for d/c.     Final Clinical Impressions(s) / ED Diagnoses   Final diagnoses:  None    ED Discharge Orders    None         Lajean Saver, MD 04/01/18 1032

## 2018-09-18 ENCOUNTER — Ambulatory Visit: Payer: BLUE CROSS/BLUE SHIELD | Admitting: Student in an Organized Health Care Education/Training Program

## 2018-09-30 ENCOUNTER — Other Ambulatory Visit: Payer: Self-pay | Admitting: Student in an Organized Health Care Education/Training Program

## 2018-10-09 ENCOUNTER — Ambulatory Visit: Payer: Self-pay | Admitting: Student in an Organized Health Care Education/Training Program

## 2018-10-20 ENCOUNTER — Other Ambulatory Visit: Payer: Self-pay | Admitting: Student in an Organized Health Care Education/Training Program

## 2018-10-30 ENCOUNTER — Other Ambulatory Visit: Payer: Self-pay

## 2018-10-30 ENCOUNTER — Encounter (HOSPITAL_COMMUNITY): Payer: Self-pay | Admitting: Emergency Medicine

## 2018-10-30 ENCOUNTER — Emergency Department (HOSPITAL_COMMUNITY)
Admission: EM | Admit: 2018-10-30 | Discharge: 2018-10-30 | Disposition: A | Discharge status: Home / Self Care | Payer: Self-pay | Attending: Emergency Medicine | Admitting: Emergency Medicine

## 2018-10-30 ENCOUNTER — Emergency Department (HOSPITAL_COMMUNITY): Payer: Self-pay

## 2018-10-30 DIAGNOSIS — Z87891 Personal history of nicotine dependence: Secondary | ICD-10-CM | POA: Insufficient documentation

## 2018-10-30 DIAGNOSIS — Z7984 Long term (current) use of oral hypoglycemic drugs: Secondary | ICD-10-CM | POA: Insufficient documentation

## 2018-10-30 DIAGNOSIS — Z79899 Other long term (current) drug therapy: Secondary | ICD-10-CM | POA: Insufficient documentation

## 2018-10-30 DIAGNOSIS — E119 Type 2 diabetes mellitus without complications: Secondary | ICD-10-CM | POA: Insufficient documentation

## 2018-10-30 DIAGNOSIS — R0789 Other chest pain: Secondary | ICD-10-CM | POA: Insufficient documentation

## 2018-10-30 DIAGNOSIS — I10 Essential (primary) hypertension: Secondary | ICD-10-CM | POA: Insufficient documentation

## 2018-10-30 DIAGNOSIS — R0600 Dyspnea, unspecified: Secondary | ICD-10-CM | POA: Insufficient documentation

## 2018-10-30 LAB — I-STAT TROPONIN, ED: Troponin i, poc: 0 ng/mL (ref 0.00–0.08)

## 2018-10-30 LAB — COMPREHENSIVE METABOLIC PANEL
ALT: 24 U/L (ref 0–44)
AST: 28 U/L (ref 15–41)
Albumin: 4.4 g/dL (ref 3.5–5.0)
Alkaline Phosphatase: 78 U/L (ref 38–126)
Anion gap: 8 (ref 5–15)
BUN: 10 mg/dL (ref 6–20)
CO2: 26 mmol/L (ref 22–32)
Calcium: 9.3 mg/dL (ref 8.9–10.3)
Chloride: 102 mmol/L (ref 98–111)
Creatinine, Ser: 0.59 mg/dL (ref 0.44–1.00)
GFR calc Af Amer: >60 mL/min (ref >60)
GFR calc non Af Amer: >60 mL/min (ref >60)
Glucose, Bld: 90 mg/dL (ref 70–99)
Potassium: 3.3 mmol/L — ABNORMAL LOW (ref 3.5–5.1)
Sodium: 136 mmol/L (ref 135–145)
Total Bilirubin: 0.4 mg/dL (ref 0.3–1.2)
Total Protein: 7.8 g/dL (ref 6.5–8.1)

## 2018-10-30 LAB — CBC
HCT: 43.3 % (ref 36.0–46.0)
Hemoglobin: 13 g/dL (ref 12.0–15.0)
MCH: 23.9 pg — ABNORMAL LOW (ref 26.0–34.0)
MCHC: 30 g/dL (ref 30.0–36.0)
MCV: 79.4 fL — ABNORMAL LOW (ref 80.0–100.0)
Platelets: 379 10*3/uL (ref 150–400)
RBC: 5.45 MIL/uL — ABNORMAL HIGH (ref 3.87–5.11)
RDW: 17.9 % — ABNORMAL HIGH (ref 11.5–15.5)
WBC: 11.2 10*3/uL — ABNORMAL HIGH (ref 4.0–10.5)
nRBC: 0 % (ref 0.0–0.2)

## 2018-10-30 LAB — BRAIN NATRIURETIC PEPTIDE: B Natriuretic Peptide: 58 pg/mL (ref 0.0–100.0)

## 2018-10-30 LAB — LIPASE, BLOOD: Lipase: 42 U/L (ref 11–51)

## 2018-10-30 LAB — TROPONIN I: Troponin I: <0.03 ng/mL (ref <0.03)

## 2018-10-30 MED ORDER — IBUPROFEN 400 MG PO TABS: 400.0000 mg | ORAL_TABLET | Freq: Three times a day (TID) | ORAL | 0 refills | Status: AC

## 2018-10-30 MED ORDER — KETOROLAC TROMETHAMINE 30 MG/ML IJ SOLN
30.0000 mg | Freq: Once | INTRAMUSCULAR | Status: AC
  Administered 2018-10-30: 30 mg via INTRAMUSCULAR
  Filled 2018-10-30: qty 1

## 2018-10-30 NOTE — ED Notes (Signed)
Verbal order to give Toradol IV instead of intramuscular per Vanita Panda, MD

## 2018-10-30 NOTE — Discharge Instructions (Signed)
As discussed, your evaluation today has been largely reassuring.  But, it is important that you monitor your condition carefully, and do not hesitate to return to the ED if you develop new, or concerning changes in your condition. ? ?Otherwise, please follow-up with your physician for appropriate ongoing care. ? ?

## 2018-10-30 NOTE — ED Provider Notes (Signed)
[facility] EMERGENCY DEPARTMENT Provider Note   CSN: [id number] Arrival date & time: 10/30/18  1720     History   Chief Complaint Chief Complaint  Patient presents with  . Chest Pain    HPI Debra Diaz is a 42 y.o. female.  HPI Patient presents with concern of chest pain, dyspnea. 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 She was well until today, about 6 hours ago when she developed substernal chest pressure, with dyspnea. Since onset symptoms been persistent, worse with activity, motion, position. No syncope, no fever, no chills, no cough. No recent medication change, diet change, activity change.  Past Medical History:  Diagnosis Date  . Anemia   . Diabetes mellitus without complication ([place])   . Hypertension   . Morbid obesity ([place])   . 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 Constipation 01/09/2018  . Breast pain, right 10/03/2017  . Irregular menses 07/02/2017  . Leukocytosis 10/03/2016  . Anxiety reaction 08/15/2016  . Healthcare maintenance 06/12/2016  . Concussion 05/05/2016  . 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 Morbid obesity ([place])   . Chest pain with high risk of acute coronary syndrome 02/27/2015  . Nonintractable episodic headache 02/26/2015  . Elevated troponin I level 02/26/2015  . HTN (hypertension) 02/26/2015  . DM2 (diabetes mellitus, type 2) ([place]) 02/26/2015    Past Surgical History:  Procedure Laterality Date  . [place] and 2011   x 2  . LEFT HEART CATHETERIZATION WITH CORONARY ANGIOGRAM N/A 03/02/2015   Procedure: LEFT HEART CATHETERIZATION WITH CORONARY ANGIOGRAM;  Surgeon: [name], MD;  Location: [facility] CATH LAB;  Service: Cardiovascular;  Laterality: N/A;  . MINIMALLY INVASIVE EXCISION OF ATRIAL MYXOMA N/A 03/04/2015   Procedure: MINIMALLY INVASIVE RESECTION OF LEFT ATRIAL MYXOMA ( USING A BILAYER PATCH CLOSURE);  Surgeon: [name], MD;  Location: [place];  Service: Open Heart Surgery;  Laterality: N/A;  . myxoma N/A    chest  . TEE WITHOUT CARDIOVERSION N/A 03/01/2015   Procedure: TRANSESOPHAGEAL ECHOCARDIOGRAM (TEE);  Surgeon: [name], MD;  Location: [facility] ENDOSCOPY;  Service: Cardiovascular;  Laterality: N/A;  . TEE WITHOUT CARDIOVERSION N/A 03/04/2015   Procedure: TRANSESOPHAGEAL ECHOCARDIOGRAM (TEE);  Surgeon: [name], MD;  Location: [place];  Service: Open Heart Surgery;  Laterality: N/A;  . TUBAL LIGATION  2011     OB History    Gravida  6   Para  5   Term  5   Preterm      AB  1   Living        SAB  1   TAB      Ectopic      Multiple      Live Births               Home Medications    Prior to Admission medications   Medication Sig Start Date End Date Taking? Authorizing Provider  ferrous sulfate (FE TABS) 325 (65 FE) MG EC tablet Take 1 tablet (325 mg total) by mouth daily. 06/28/17   Everrett Coombe, MD  ibuprofen (ADVIL,MOTRIN) 800 MG tablet TAKE 1 TABLET BY MOUTH EVERY 8 HOURS AS NEEDED 08/08/17   Everrett Coombe, MD  lisinopril-hydrochlorothiazide (PRINZIDE,ZESTORETIC) 20-25 MG tablet TAKE 1 TABLET BY MOUTH DAILY 10/01/18  Everrett Coombe, MD  metFORMIN (GLUCOPHAGE) 500 MG tablet TAKE 1 TABLET(500 MG) BY MOUTH DAILY WITH BREAKFAST 01/09/18   Everrett Coombe, MD  polyethylene glycol powder (GLYCOLAX/MIRALAX) powder Take 17 g by mouth 2 (two) times daily as needed for mild constipation or moderate constipation. 01/09/18   Everrett Coombe, MD    Family History Family History  Problem Relation Age of Onset  . Hypertension Mother   . Diabetes Father   . Asthma Son   . Breast cancer Maternal Grandmother     Social History Social History   Tobacco Use  . Smoking status: Former Smoker    Last attempt to quit: 11/17/2017    Years since quitting: 0.9  . Smokeless  tobacco: Never Used  Substance Use Topics  . Alcohol use: Yes    Comment: occasional  . Drug use: No     Allergies   Patient has no known allergies.   Review of Systems Review of Systems  Constitutional:       Per HPI, otherwise negative  HENT:       Per HPI, otherwise negative  Respiratory:       Per HPI, otherwise negative  Cardiovascular:       Per HPI, otherwise negative  Gastrointestinal: Negative for vomiting.  Endocrine:       Negative aside from HPI  Genitourinary:       Neg aside from HPI   Musculoskeletal:       Per HPI, otherwise negative  Skin: Negative.   Neurological: Negative for syncope.     Physical Exam Updated Vital Signs BP 131/82 (BP Location: Left Arm) Comment: Simultaneous filing. User may not have seen previous data.  Pulse 84 Comment: Simultaneous filing. User may not have seen previous data.  Temp 98.2 F (36.8 C) (Oral)   Resp 15 Comment: Simultaneous filing. User may not have seen previous data.  Ht 5\' 3"  (1.6 m)   Wt 86.2 kg   LMP 10/12/2018   SpO2 100% Comment: Simultaneous filing. User may not have seen previous data.  BMI 33.66 kg/m   Physical Exam  Constitutional: She is oriented to person, place, and time. She appears well-developed and well-nourished. No distress.  HENT:  Head: Normocephalic and atraumatic.  Eyes: Conjunctivae and EOM are normal.  Cardiovascular: Normal rate and regular rhythm.  Pulmonary/Chest: Effort normal and breath sounds normal. No stridor. No respiratory distress.  Tender to palpation with mild pressure to the sternum.  Abdominal: She exhibits no distension.  Musculoskeletal: She exhibits no edema.  Neurological: She is alert and oriented to person, place, and time. No cranial nerve deficit.  Skin: Skin is warm and dry.  Psychiatric: She has a normal mood and affect.  Nursing note and vitals reviewed.  I reviewed the patient's chart after the initial evaluation. Patient has history notable for  echocardiogram, stress test was in the past 3 years both of which were unremarkable. 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   ED Treatments / Results  Labs (all labs ordered are listed, but only abnormal results are displayed) Labs Reviewed  CBC - Abnormal; Notable for the following components:      Result Value   WBC 11.2 (*)    RBC 5.45 (*)    MCV 79.4 (*)    MCH 23.9 (*)    RDW 17.9 (*)    All other components within normal limits  COMPREHENSIVE METABOLIC PANEL - Abnormal; Notable for the following components:  Potassium 3.3 (*)    All other components within normal limits  TROPONIN I  LIPASE, BLOOD  BRAIN NATRIURETIC PEPTIDE  I-STAT TROPONIN, ED  I-STAT TROPONIN, ED    EKG EKG Interpretation  Date/Time:  Wednesday October 30 2018 17:30:00 EST Ventricular Rate:  61 PR Interval:    QRS Duration: 84 QT Interval:  410 QTC Calculation: 413 R Axis:   8 Text Interpretation:  Sinus rhythm Abnormal R-wave progression, early transition LVH by voltage No significant change since last tracing Confirmed by Orlie Dakin [phone]) on 10/30/2018 5:35:41 PM   Radiology Dg Chest 2 View  Result Date: 10/30/2018 CLINICAL DATA:  Initial evaluation for acute mid chest pain, shortness of breath, nausea. EXAM: CHEST - 2 VIEW COMPARISON:  Prior radiograph 04/01/2018. FINDINGS: Mild cardiomegaly, stable. Mediastinal silhouette within normal limits. Lungs mildly hypoinflated. Associated mild linear right basilar atelectasis. Lungs are otherwise clear with no focal infiltrates identified. No pulmonary edema or pleural effusion. No pneumothorax. No acute osseous abnormality. IMPRESSION: Mild right basilar atelectasis. No other active cardiopulmonary disease. Electronically Signed   By: Jeannine Boga M.D.   On: 10/30/2018 18:32    Procedures Procedures (including critical care time)  Medications Ordered in ED Medications  ketorolac (TORADOL) 30  MG/ML injection 30 mg (30 mg Intramuscular Given 10/30/18 1800)     Initial Impression / Assessment and Plan / ED Course  I have reviewed the triage vital signs and the nursing notes.  Pertinent labs & imaging results that were available during my care of the patient were reviewed by me and considered in my medical decision making (see chart for details).     9:10 PM Patient feels better. Second troponin normal, and with reassuring prior stress test, no ischemic changes on EKG, serial troponins normal, resolution of pain, there is low suspicion for ACS, no evidence for any PE, pneumonia. Suspicion for inflammatory condition given the reproducibility of the pain, improvement after anti-inflammatory medication. Patient discharged in stable condition to follow-up with primary care.  Final Clinical Impressions(s) / ED Diagnoses   Final diagnoses:  Atypical chest pain    ED Discharge Orders         Ordered    ibuprofen (ADVIL,MOTRIN) 400 MG tablet  3 times daily     10/30/18 2111           Carmin Muskrat, MD 10/30/18 2111

## 2018-10-30 NOTE — ED Triage Notes (Signed)
Patient reports onset of chest pain this am, has hx of cardiac surgery for removal of a tumor. Patient c/o dizziness, SOB, and headache. Patient reports "a little bit of nausea." No emesis.

## 2019-06-18 ENCOUNTER — Other Ambulatory Visit: Payer: Self-pay | Admitting: *Deleted

## 2019-06-18 DIAGNOSIS — Z20822 Contact with and (suspected) exposure to covid-19: Secondary | ICD-10-CM

## 2019-06-22 LAB — NOVEL CORONAVIRUS, NAA: SARS-CoV-2, NAA: NOT DETECTED

## 2019-06-22 LAB — SPECIMEN STATUS REPORT

## 2019-06-24 ENCOUNTER — Telehealth: Payer: Self-pay | Admitting: General Practice

## 2019-06-24 NOTE — Telephone Encounter (Signed)
Pt called in for covid results.   Advised pt of negative results.

## 2019-07-29 ENCOUNTER — Other Ambulatory Visit: Payer: Self-pay | Admitting: *Deleted

## 2019-07-29 MED ORDER — METFORMIN HCL 500 MG PO TABS: ORAL_TABLET | ORAL | 3 refills | Status: DC

## 2019-11-12 ENCOUNTER — Ambulatory Visit: Payer: Self-pay | Admitting: Family Medicine

## 2020-01-14 ENCOUNTER — Ambulatory Visit: Payer: Medicaid Other | Attending: Internal Medicine

## 2020-01-14 DIAGNOSIS — Z20822 Contact with and (suspected) exposure to covid-19: Secondary | ICD-10-CM

## 2020-01-15 LAB — NOVEL CORONAVIRUS, NAA: SARS-CoV-2, NAA: DETECTED — AB

## 2020-01-16 ENCOUNTER — Telehealth: Payer: Self-pay | Admitting: *Deleted

## 2020-01-16 NOTE — Telephone Encounter (Signed)
Interpreter: Cam Hai [phone] Patient is calling to request copy of COVID lab results for herself and children be mailed to her. Request sent to [facility].

## 2020-02-11 ENCOUNTER — Ambulatory Visit (INDEPENDENT_AMBULATORY_CARE_PROVIDER_SITE_OTHER): Payer: Medicaid Other | Admitting: Family Medicine

## 2020-02-11 ENCOUNTER — Other Ambulatory Visit: Payer: Self-pay

## 2020-02-11 ENCOUNTER — Encounter: Payer: Self-pay | Admitting: Family Medicine

## 2020-02-11 ENCOUNTER — Other Ambulatory Visit (HOSPITAL_COMMUNITY)
Admission: RE | Admit: 2020-02-11 | Discharge: 2020-02-11 | Disposition: A | Discharge status: Home / Self Care | Payer: Medicaid Other | Source: Ambulatory Visit | Attending: Family Medicine | Admitting: Family Medicine

## 2020-02-11 VITALS — BP 128/72 | HR 58 | Wt 207.4 lb

## 2020-02-11 DIAGNOSIS — I10 Essential (primary) hypertension: Secondary | ICD-10-CM

## 2020-02-11 DIAGNOSIS — E1121 Type 2 diabetes mellitus with diabetic nephropathy: Secondary | ICD-10-CM

## 2020-02-11 DIAGNOSIS — N926 Irregular menstruation, unspecified: Secondary | ICD-10-CM

## 2020-02-11 DIAGNOSIS — Z202 Contact with and (suspected) exposure to infections with a predominantly sexual mode of transmission: Secondary | ICD-10-CM | POA: Insufficient documentation

## 2020-02-11 DIAGNOSIS — A630 Anogenital (venereal) warts: Secondary | ICD-10-CM | POA: Insufficient documentation

## 2020-02-11 DIAGNOSIS — Z0000 Encounter for general adult medical examination without abnormal findings: Secondary | ICD-10-CM | POA: Insufficient documentation

## 2020-02-11 DIAGNOSIS — N939 Abnormal uterine and vaginal bleeding, unspecified: Secondary | ICD-10-CM | POA: Diagnosis not present

## 2020-02-11 HISTORY — DX: Contact with and (suspected) exposure to infections with a predominantly sexual mode of transmission: Z20.2

## 2020-02-11 LAB — POCT WET PREP (WET MOUNT)
Clue Cells Wet Prep Whiff POC: NEGATIVE
Trichomonas Wet Prep HPF POC: ABSENT
WBC, Wet Prep HPF POC: >20

## 2020-02-11 LAB — POCT GLYCOSYLATED HEMOGLOBIN (HGB A1C): HbA1c, POC (controlled diabetic range): 6.2 % (ref 0.0–7.0)

## 2020-02-11 MED ORDER — IMIQUIMOD 5 % EX CREA: TOPICAL_CREAM | CUTANEOUS | 1 refills | Status: DC

## 2020-02-11 NOTE — Assessment & Plan Note (Signed)
Will obtain hemoglobin A1c and lipid panel today.

## 2020-02-11 NOTE — Progress Notes (Signed)
    SUBJECTIVE:   CHIEF COMPLAINT / HPI:   Bleeding after sex This has been occurring for the past 1 to 2 months Denies dyspareunia This has never occurred before Was recently separated from her husband but they are back together and she does not know whether he had sex with anyone else in the interim Recently saw bumps around the vaginal area and was concerned.  These are nonpainful and were first noticed 2 weeks ago. No changes in vaginal discharge, no vaginal irritation  Abnormal uterine bleeding Has been occurring for several months Periods sometimes last for 8 to 10 days and are irregular They seem to be heavier than normal as well Has had weight gain continue to eating more since the pandemic started Denies heat or cold intolerance, hair or skin changes, but does endorse fatigue  Continues to take Metformin 500 mg daily and lisinopril-HCTZ-25 mg daily  PERTINENT  PMH / PSH: type 2 diabetes mellitus,   OBJECTIVE:   BP 128/72   Pulse (!) 58   Wt 207 lb 6.4 oz (94.1 kg)   SpO2 99%   BMI 36.74 kg/m   General: well appearing, appears stated age Cardiac: RRR, no MRG Respiratory: CTAB, no rhonchi, rales, or wheezing, normal work of breathing GU: Vulva with about 15 flesh colored papules that are nontender to palpation, normal-appearing vagina and cervix without lacerations or lesions, normal-appearing vaginal discharge, no cervical motion tenderness Psych: appropriate mood and affect    ASSESSMENT/PLAN:   HTN (hypertension) Well-controlled today.  Will obtain BMP  DM2 (diabetes mellitus, type 2) Will obtain hemoglobin A1c and lipid panel today.  Healthcare maintenance Pap smear performed today.  Possible exposure to STD GC/chlamydia, wet prep, HIV, and RPR obtained today.  Abnormal uterine bleeding Will obtain CBC and TSH today, especially due to patient's fatigue.  Will consider transvaginal ultrasound as the next step.  Genital warts Exam is consistent with  this diagnosis today.  Patient counseled that while these are sexually transmitted, they are not dangerous to her and are not necessarily recently transmitted, since she may have had the virus for a while and it was just recently activated by a stressor.  We will treat with imiquimod 5 mg 3 times weekly at bedtime until the lesions resolve and at most for 8 weeks.       Debra Alu, MD [place]

## 2020-02-11 NOTE — Assessment & Plan Note (Signed)
Well-controlled today.  Will obtain BMP

## 2020-02-11 NOTE — Assessment & Plan Note (Signed)
Pap smear performed today. 

## 2020-02-11 NOTE — Assessment & Plan Note (Addendum)
Exam is consistent with this diagnosis today.  Patient counseled that while these are sexually transmitted, they are not dangerous to her and are not necessarily recently transmitted, since she may have had the virus for a while and it was just recently activated by a stressor.  We will treat with imiquimod 5 mg 3 times weekly at bedtime until the lesions resolve and at most for 8 weeks.

## 2020-02-11 NOTE — Assessment & Plan Note (Signed)
GC/chlamydia, wet prep, HIV, and RPR obtained today.

## 2020-02-11 NOTE — Patient Instructions (Addendum)
It was nice seeing you today Ms. Algis Downs!  The bumps on your vulva, I am prescribing a cream that you should apply 3 times per week before bedtime.  Please wash this off in the morning.  Use this until the bumps are gone.  You may use this up to 8 weeks if you continue to have bumps.  We are getting many tests today.  I will call you with these results.  If you have any questions or concerns, please feel free to call the clinic.   Be well,  Dr. Shan Levans

## 2020-02-11 NOTE — Assessment & Plan Note (Signed)
Will obtain CBC and TSH today, especially due to patient's fatigue.  Will consider transvaginal ultrasound as the next step.

## 2020-02-12 LAB — LIPID PANEL
Chol/HDL Ratio: 3.5 ratio (ref 0.0–4.4)
Cholesterol, Total: 165 mg/dL (ref 100–199)
HDL: 47 mg/dL (ref >39)
LDL Chol Calc (NIH): 100 mg/dL — ABNORMAL HIGH (ref 0–99)
Triglycerides: 98 mg/dL (ref 0–149)
VLDL Cholesterol Cal: 18 mg/dL (ref 5–40)

## 2020-02-12 LAB — CBC
Hematocrit: 39.6 % (ref 34.0–46.6)
Hemoglobin: 12 g/dL (ref 11.1–15.9)
MCH: 23 pg — ABNORMAL LOW (ref 26.6–33.0)
MCHC: 30.3 g/dL — ABNORMAL LOW (ref 31.5–35.7)
MCV: 76 fL — ABNORMAL LOW (ref 79–97)
Platelets: 346 10*3/uL (ref 150–450)
RBC: 5.21 x10E6/uL (ref 3.77–5.28)
RDW: 15.9 % — ABNORMAL HIGH (ref 11.7–15.4)
WBC: 8.4 10*3/uL (ref 3.4–10.8)

## 2020-02-12 LAB — BASIC METABOLIC PANEL
BUN/Creatinine Ratio: 21 (ref 9–23)
BUN: 12 mg/dL (ref 6–24)
CO2: 25 mmol/L (ref 20–29)
Calcium: 9.4 mg/dL (ref 8.7–10.2)
Chloride: 101 mmol/L (ref 96–106)
Creatinine, Ser: 0.58 mg/dL (ref 0.57–1.00)
GFR calc Af Amer: 130 mL/min/{1.73_m2} (ref >59)
GFR calc non Af Amer: 112 mL/min/{1.73_m2} (ref >59)
Glucose: 131 mg/dL — ABNORMAL HIGH (ref 65–99)
Potassium: 4.4 mmol/L (ref 3.5–5.2)
Sodium: 138 mmol/L (ref 134–144)

## 2020-02-12 LAB — RPR: RPR Ser Ql: NONREACTIVE

## 2020-02-12 LAB — HIV ANTIBODY (ROUTINE TESTING W REFLEX): HIV Screen 4th Generation wRfx: NONREACTIVE

## 2020-02-12 LAB — TSH: TSH: 1.18 u[IU]/mL (ref 0.450–4.500)

## 2020-02-13 LAB — CYTOLOGY - PAP
Chlamydia: NEGATIVE
Comment: NEGATIVE
Comment: NEGATIVE
Comment: NORMAL
Diagnosis: NEGATIVE
High risk HPV: NEGATIVE
Neisseria Gonorrhea: NEGATIVE

## 2020-02-14 ENCOUNTER — Other Ambulatory Visit: Payer: Self-pay

## 2020-02-14 ENCOUNTER — Encounter (HOSPITAL_COMMUNITY): Payer: Self-pay | Admitting: Emergency Medicine

## 2020-02-14 ENCOUNTER — Observation Stay (HOSPITAL_COMMUNITY)
Admission: EM | Admit: 2020-02-14 | Discharge: 2020-02-16 | Disposition: A | Discharge status: Home / Self Care | Payer: Medicaid Other | Attending: Family Medicine | Admitting: Family Medicine

## 2020-02-14 DIAGNOSIS — E119 Type 2 diabetes mellitus without complications: Secondary | ICD-10-CM | POA: Diagnosis not present

## 2020-02-14 DIAGNOSIS — Z87891 Personal history of nicotine dependence: Secondary | ICD-10-CM | POA: Diagnosis not present

## 2020-02-14 DIAGNOSIS — R001 Bradycardia, unspecified: Secondary | ICD-10-CM | POA: Insufficient documentation

## 2020-02-14 DIAGNOSIS — I25110 Atherosclerotic heart disease of native coronary artery with unstable angina pectoris: Secondary | ICD-10-CM | POA: Diagnosis not present

## 2020-02-14 DIAGNOSIS — Z6835 Body mass index (BMI) 35.0-35.9, adult: Secondary | ICD-10-CM | POA: Insufficient documentation

## 2020-02-14 DIAGNOSIS — I10 Essential (primary) hypertension: Secondary | ICD-10-CM | POA: Diagnosis not present

## 2020-02-14 DIAGNOSIS — D649 Anemia, unspecified: Secondary | ICD-10-CM | POA: Insufficient documentation

## 2020-02-14 DIAGNOSIS — R079 Chest pain, unspecified: Secondary | ICD-10-CM | POA: Diagnosis present

## 2020-02-14 DIAGNOSIS — E785 Hyperlipidemia, unspecified: Secondary | ICD-10-CM | POA: Diagnosis not present

## 2020-02-14 DIAGNOSIS — Z79899 Other long term (current) drug therapy: Secondary | ICD-10-CM | POA: Diagnosis not present

## 2020-02-14 DIAGNOSIS — A630 Anogenital (venereal) warts: Secondary | ICD-10-CM | POA: Diagnosis not present

## 2020-02-14 DIAGNOSIS — Z23 Encounter for immunization: Secondary | ICD-10-CM | POA: Diagnosis not present

## 2020-02-14 DIAGNOSIS — I214 Non-ST elevation (NSTEMI) myocardial infarction: Secondary | ICD-10-CM

## 2020-02-14 DIAGNOSIS — Z20822 Contact with and (suspected) exposure to covid-19: Secondary | ICD-10-CM | POA: Diagnosis not present

## 2020-02-14 HISTORY — DX: Atherosclerotic heart disease of native coronary artery without angina pectoris: I25.10

## 2020-02-14 MED ORDER — SODIUM CHLORIDE 0.9% FLUSH: 3.0000 mL | Freq: Once | INTRAVENOUS | Status: DC

## 2020-02-14 NOTE — ED Triage Notes (Signed)
Patient reports central chest pain with SOB onset today , denies emesis or diaphoresis , no cough or fever .

## 2020-02-15 ENCOUNTER — Encounter (HOSPITAL_COMMUNITY): Payer: Self-pay | Admitting: Family Medicine

## 2020-02-15 ENCOUNTER — Emergency Department (HOSPITAL_COMMUNITY): Payer: Medicaid Other

## 2020-02-15 ENCOUNTER — Other Ambulatory Visit: Payer: Self-pay

## 2020-02-15 ENCOUNTER — Other Ambulatory Visit (HOSPITAL_COMMUNITY): Payer: Self-pay

## 2020-02-15 DIAGNOSIS — I159 Secondary hypertension, unspecified: Secondary | ICD-10-CM

## 2020-02-15 DIAGNOSIS — I214 Non-ST elevation (NSTEMI) myocardial infarction: Secondary | ICD-10-CM | POA: Diagnosis not present

## 2020-02-15 DIAGNOSIS — R079 Chest pain, unspecified: Secondary | ICD-10-CM | POA: Diagnosis present

## 2020-02-15 DIAGNOSIS — Z23 Encounter for immunization: Secondary | ICD-10-CM | POA: Diagnosis not present

## 2020-02-15 DIAGNOSIS — R001 Bradycardia, unspecified: Secondary | ICD-10-CM | POA: Diagnosis not present

## 2020-02-15 DIAGNOSIS — E1169 Type 2 diabetes mellitus with other specified complication: Secondary | ICD-10-CM | POA: Diagnosis not present

## 2020-02-15 DIAGNOSIS — E119 Type 2 diabetes mellitus without complications: Secondary | ICD-10-CM | POA: Diagnosis not present

## 2020-02-15 HISTORY — DX: Chest pain, unspecified: R07.9

## 2020-02-15 LAB — GLUCOSE, CAPILLARY
Glucose-Capillary: 107 mg/dL — ABNORMAL HIGH (ref 70–99)
Glucose-Capillary: 128 mg/dL — ABNORMAL HIGH (ref 70–99)
Glucose-Capillary: 126 mg/dL — ABNORMAL HIGH (ref 70–99)
Glucose-Capillary: 126 mg/dL — ABNORMAL HIGH (ref 70–99)

## 2020-02-15 LAB — TROPONIN I (HIGH SENSITIVITY)
Troponin I (High Sensitivity): 34 ng/L — ABNORMAL HIGH (ref <18)
Troponin I (High Sensitivity): 36 ng/L — ABNORMAL HIGH (ref <18)
Troponin I (High Sensitivity): 48 ng/L — ABNORMAL HIGH (ref <18)
Troponin I (High Sensitivity): 8 ng/L (ref <18)

## 2020-02-15 LAB — I-STAT BETA HCG BLOOD, ED (MC, WL, AP ONLY): I-stat hCG, quantitative: <5 m[IU]/mL (ref <5)

## 2020-02-15 LAB — BASIC METABOLIC PANEL
Anion gap: 13 (ref 5–15)
BUN: 17 mg/dL (ref 6–20)
CO2: 22 mmol/L (ref 22–32)
Calcium: 9.2 mg/dL (ref 8.9–10.3)
Chloride: 103 mmol/L (ref 98–111)
Creatinine, Ser: 0.57 mg/dL (ref 0.44–1.00)
GFR calc Af Amer: >60 mL/min (ref >60)
GFR calc non Af Amer: >60 mL/min (ref >60)
Glucose, Bld: 173 mg/dL — ABNORMAL HIGH (ref 70–99)
Potassium: 3.7 mmol/L (ref 3.5–5.1)
Sodium: 138 mmol/L (ref 135–145)

## 2020-02-15 LAB — CBC
HCT: 39.6 % (ref 36.0–46.0)
Hemoglobin: 12 g/dL (ref 12.0–15.0)
MCH: 23.5 pg — ABNORMAL LOW (ref 26.0–34.0)
MCHC: 30.3 g/dL (ref 30.0–36.0)
MCV: 77.5 fL — ABNORMAL LOW (ref 80.0–100.0)
Platelets: 319 10*3/uL (ref 150–400)
RBC: 5.11 MIL/uL (ref 3.87–5.11)
RDW: 17.3 % — ABNORMAL HIGH (ref 11.5–15.5)
WBC: 11 10*3/uL — ABNORMAL HIGH (ref 4.0–10.5)
nRBC: 0 % (ref 0.0–0.2)

## 2020-02-15 LAB — HEPARIN LEVEL (UNFRACTIONATED): Heparin Unfractionated: 0.22 IU/mL — ABNORMAL LOW (ref 0.30–0.70)

## 2020-02-15 LAB — HEMOGLOBIN A1C
Hgb A1c MFr Bld: 6.5 % — ABNORMAL HIGH (ref 4.8–5.6)
Mean Plasma Glucose: 139.85 mg/dL

## 2020-02-15 LAB — SARS CORONAVIRUS 2 (TAT 6-24 HRS): SARS Coronavirus 2: NEGATIVE

## 2020-02-15 LAB — TSH: TSH: 2.818 u[IU]/mL (ref 0.350–4.500)

## 2020-02-15 MED ORDER — AMLODIPINE BESYLATE 2.5 MG PO TABS
1.2500 mg | ORAL_TABLET | Freq: Every day | ORAL | Status: DC
  Administered 2020-02-15 – 2020-02-16 (×2): 1.25 mg via ORAL
  Filled 2020-02-15 (×2): qty 1

## 2020-02-15 MED ORDER — ASPIRIN EC 81 MG PO TBEC
81.0000 mg | DELAYED_RELEASE_TABLET | Freq: Every day | ORAL | Status: DC
  Administered 2020-02-16: 81 mg via ORAL
  Filled 2020-02-15: qty 1

## 2020-02-15 MED ORDER — ONDANSETRON HCL 4 MG/2ML IJ SOLN: 4.0000 mg | Freq: Four times a day (QID) | INTRAMUSCULAR | Status: DC | PRN

## 2020-02-15 MED ORDER — PANTOPRAZOLE SODIUM 40 MG PO TBEC
40.0000 mg | DELAYED_RELEASE_TABLET | Freq: Every day | ORAL | Status: DC
  Administered 2020-02-15 – 2020-02-16 (×2): 40 mg via ORAL
  Filled 2020-02-15 (×2): qty 1

## 2020-02-15 MED ORDER — LIDOCAINE VISCOUS HCL 2 % MT SOLN
15.0000 mL | Freq: Once | OROMUCOSAL | Status: AC
  Administered 2020-02-15: 15 mL via ORAL
  Filled 2020-02-15: qty 15

## 2020-02-15 MED ORDER — NITROGLYCERIN 0.4 MG SL SUBL: 0.4000 mg | SUBLINGUAL_TABLET | SUBLINGUAL | Status: DC | PRN

## 2020-02-15 MED ORDER — ASPIRIN 81 MG PO CHEW
324.0000 mg | CHEWABLE_TABLET | Freq: Once | ORAL | Status: AC
  Administered 2020-02-15: 324 mg via ORAL
  Filled 2020-02-15: qty 4

## 2020-02-15 MED ORDER — HEPARIN (PORCINE) 25000 UT/250ML-% IV SOLN
1200.0000 [IU]/h | INTRAVENOUS | Status: DC
  Administered 2020-02-15: 1200 [IU]/h via INTRAVENOUS
  Filled 2020-02-15: qty 250

## 2020-02-15 MED ORDER — INFLUENZA VAC SPLIT QUAD 0.5 ML IM SUSY
0.5000 mL | PREFILLED_SYRINGE | INTRAMUSCULAR | Status: AC
  Administered 2020-02-16: 0.5 mL via INTRAMUSCULAR

## 2020-02-15 MED ORDER — ATORVASTATIN CALCIUM 40 MG PO TABS
40.0000 mg | ORAL_TABLET | Freq: Every day | ORAL | Status: DC
  Administered 2020-02-15: 40 mg via ORAL
  Filled 2020-02-15: qty 1

## 2020-02-15 MED ORDER — KETOROLAC TROMETHAMINE 30 MG/ML IJ SOLN
15.0000 mg | Freq: Once | INTRAMUSCULAR | Status: AC
  Administered 2020-02-15: 15 mg via INTRAMUSCULAR
  Filled 2020-02-15: qty 1

## 2020-02-15 MED ORDER — ACETAMINOPHEN 325 MG PO TABS
650.0000 mg | ORAL_TABLET | ORAL | Status: DC | PRN
  Administered 2020-02-15: 650 mg via ORAL
  Filled 2020-02-15: qty 2

## 2020-02-15 MED ORDER — HEPARIN BOLUS VIA INFUSION
4000.0000 [IU] | Freq: Once | INTRAVENOUS | Status: AC
  Administered 2020-02-15: 4000 [IU] via INTRAVENOUS
  Filled 2020-02-15: qty 4000

## 2020-02-15 MED ORDER — INSULIN ASPART 100 UNIT/ML ~~LOC~~ SOLN
0.0000 [IU] | Freq: Three times a day (TID) | SUBCUTANEOUS | Status: DC
  Administered 2020-02-15 – 2020-02-16 (×3): 1 [IU] via SUBCUTANEOUS

## 2020-02-15 MED ORDER — ALUM & MAG HYDROXIDE-SIMETH 200-200-20 MG/5ML PO SUSP
30.0000 mL | Freq: Once | ORAL | Status: AC
  Administered 2020-02-15: 30 mL via ORAL
  Filled 2020-02-15: qty 30

## 2020-02-15 MED ORDER — HEPARIN (PORCINE) 25000 UT/250ML-% IV SOLN: 14.0000 [IU]/kg/h | INTRAVENOUS | Status: DC

## 2020-02-15 MED ORDER — POLYETHYLENE GLYCOL 3350 17 G PO PACK
17.0000 g | PACK | Freq: Two times a day (BID) | ORAL | Status: DC
  Administered 2020-02-15 – 2020-02-16 (×2): 17 g via ORAL
  Filled 2020-02-15 (×2): qty 1

## 2020-02-15 MED ORDER — ENOXAPARIN SODIUM 60 MG/0.6ML ~~LOC~~ SOLN
0.5000 mg/kg | SUBCUTANEOUS | Status: DC
  Administered 2020-02-15 – 2020-02-16 (×2): 50 mg via SUBCUTANEOUS
  Filled 2020-02-15 (×2): qty 0.6

## 2020-02-15 NOTE — ED Provider Notes (Signed)
[facility] EMERGENCY DEPARTMENT Provider Note   CSN: [id number] Arrival date & time: 02/14/20  2309     History Chief Complaint  Patient presents with  . Chest Pain    Debra Diaz is a 44 y.o. female.  HPI     This a 44 year old female with a history of diabetes, coronary artery disease, hypertension who presents with chest pain.  Patient reports onset of chest pain around 10 PM.  She describes pressure that is nonradiating.  Currently her pain is 6 out of 10.  She reports associated shortness of breath.  No fever or cough.  Denies any recent illnesses.  Nothing seems to make her pain better or worse.  She states that she was cleaning when the pain first started.  She denies any lower extremity swelling or history of blood clots.  Denies any estrogen product use.  Of note, patient tested positive for COVID-19 on January 27.  She has had prior cardiac evaluations.  Normal echocardiogram.  2017 she had a low risk Myoview.  Past Medical History:  Diagnosis Date  . Anemia   . Coronary artery disease   . Diabetes mellitus without complication ([place])   . Hypertension   . Morbid obesity ([place])   . 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 Possible exposure to STD 02/11/2020  . Abnormal uterine bleeding 02/11/2020  . Genital warts 02/11/2020  . Constipation 01/09/2018  . Breast pain, right 10/03/2017  . Irregular menses 07/02/2017  . Leukocytosis 10/03/2016  . Anxiety reaction 08/15/2016  . Healthcare maintenance 06/12/2016  . Concussion 05/05/2016  . 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 Morbid obesity ([place])   . Chest pain with high risk of acute coronary syndrome 02/27/2015  . Nonintractable episodic headache 02/26/2015  . Elevated troponin I level 02/26/2015  . HTN (hypertension) 02/26/2015  . DM2 (diabetes mellitus, type 2)  ([place]) 02/26/2015    Past Surgical History:  Procedure Laterality Date  . [place] and 2011   x 2  . LEFT HEART CATHETERIZATION WITH CORONARY ANGIOGRAM N/A 03/02/2015   Procedure: LEFT HEART CATHETERIZATION WITH CORONARY ANGIOGRAM;  Surgeon: [name], MD;  Location: [facility] CATH LAB;  Service: Cardiovascular;  Laterality: N/A;  . MINIMALLY INVASIVE EXCISION OF ATRIAL MYXOMA N/A 03/04/2015   Procedure: MINIMALLY INVASIVE RESECTION OF LEFT ATRIAL MYXOMA ( USING A BILAYER PATCH CLOSURE);  Surgeon: [name], MD;  Location: [place];  Service: Open Heart Surgery;  Laterality: N/A;  . myxoma N/A    chest  . TEE WITHOUT CARDIOVERSION N/A 03/01/2015   Procedure: TRANSESOPHAGEAL ECHOCARDIOGRAM (TEE);  Surgeon: [name], MD;  Location: [facility] ENDOSCOPY;  Service: Cardiovascular;  Laterality: N/A;  . TEE WITHOUT CARDIOVERSION N/A 03/04/2015   Procedure: TRANSESOPHAGEAL ECHOCARDIOGRAM (TEE);  Surgeon: [name], MD;  Location: [place];  Service: Open Heart Surgery;  Laterality: N/A;  . TUBAL LIGATION  2011     OB History    Gravida  6   Para  5   Term  5   Preterm      AB  1   Living        SAB  1   TAB      Ectopic      Multiple      Live Births  Family History  Problem Relation Age of Onset  . Hypertension Mother   . Diabetes Father   . Asthma Son   . Breast cancer Maternal Grandmother     Social History   Tobacco Use  . Smoking status: Former Smoker    Quit date: 11/17/2017    Years since quitting: 2.2  . Smokeless tobacco: Never Used  Substance Use Topics  . Alcohol use: Yes    Comment: occasional  . Drug use: No    Home Medications Prior to Admission medications   Medication Sig Start Date End Date Taking? Authorizing Provider  imiquimod (ALDARA) 5 % cream Apply topically 3 (three) times a week. Apply at bedtime and wash off in the morning.  Use for up to 8 weeks or until infection is gone. 02/11/20   Kathrene Alu, MD  lisinopril-hydrochlorothiazide (PRINZIDE,ZESTORETIC) 20-25 MG tablet TAKE 1 TABLET BY MOUTH DAILY 10/01/18   Everrett Coombe, MD  metFORMIN (GLUCOPHAGE) 500 MG tablet TAKE 1 TABLET(500 MG) BY MOUTH DAILY WITH BREAKFAST 07/29/19   Kathrene Alu, MD    Allergies    Patient has no known allergies.  Review of Systems   Review of Systems  Constitutional: Negative for fever.  Respiratory: Positive for shortness of breath. Negative for cough.   Cardiovascular: Positive for chest pain. Negative for leg swelling.  Gastrointestinal: Negative for abdominal pain, nausea and vomiting.  Genitourinary: Negative for dysuria.  All other systems reviewed and are negative.   Physical Exam Updated Vital Signs BP 115/65   Pulse (!) 51   Temp 98.9 F (37.2 C) (Oral)   Resp 11   Ht 1.626 m (5\' 4" )   Wt 100 kg   LMP 01/22/2020   SpO2 99%   BMI 37.84 kg/m   Physical Exam Vitals and nursing note reviewed.  Constitutional:      Appearance: She is well-developed. She is obese. She is not ill-appearing.     Comments: Anxious appearing but nontoxic  HENT:     Head: Normocephalic and atraumatic.  Eyes:     Pupils: Pupils are equal, round, and reactive to light.  Cardiovascular:     Rate and Rhythm: Normal rate and regular rhythm.     Heart sounds: Normal heart sounds.  Pulmonary:     Effort: Pulmonary effort is normal. No respiratory distress.     Breath sounds: No wheezing.  Abdominal:     General: Bowel sounds are normal.     Palpations: Abdomen is soft.  Musculoskeletal:     Cervical back: Neck supple.     Right lower leg: No tenderness. No edema.     Left lower leg: No tenderness. No edema.  Skin:    General: Skin is warm and dry.  Neurological:     Mental Status: She is alert and oriented to person, place, and time.  Psychiatric:        Mood and Affect: Mood normal.     ED Results / Procedures / Treatments   Labs (all labs ordered are listed, but only abnormal results  are displayed) Labs Reviewed  BASIC METABOLIC PANEL - Abnormal; Notable for the following components:      Result Value   Glucose, Bld 173 (*)    All other components within normal limits  CBC - Abnormal; Notable for the following components:   WBC 11.0 (*)    MCV 77.5 (*)    MCH 23.5 (*)    RDW 17.3 (*)  All other components within normal limits  TROPONIN I (HIGH SENSITIVITY) - Abnormal; Notable for the following components:   Troponin I (High Sensitivity) 36 (*)    All other components within normal limits  HEPARIN LEVEL (UNFRACTIONATED)  I-STAT BETA HCG BLOOD, ED ([facility], [facility], [facility] ONLY)  TROPONIN I (HIGH SENSITIVITY)    EKG EKG Interpretation  Date/Time:  Saturday February 14 2020 23:13:46 EST Ventricular Rate:  69 PR Interval:  142 QRS Duration: 80 QT Interval:  390 QTC Calculation: 417 R Axis:   14 Text Interpretation: Normal sinus rhythm Normal ECG Confirmed by Thayer Jew [phone]) on 02/15/2020 12:34:30 AM   Radiology DG Chest 2 View  Result Date: 02/15/2020 CLINICAL DATA:  Chest pain EXAM: CHEST - 2 VIEW COMPARISON:  October 30, 2018 FINDINGS: The heart size and mediastinal contours are within normal limits. Both lungs are clear. The visualized skeletal structures are unremarkable. IMPRESSION: No active cardiopulmonary disease. Electronically Signed   By: Prudencio Pair M.D.   On: 02/15/2020 01:07    Procedures Procedures (including critical care time)  CRITICAL CARE Performed by: Merryl Hacker   Total critical care time: 35 minutes  Critical care time was exclusive of separately billable procedures and treating other patients.  Critical care was necessary to treat or prevent imminent or life-threatening deterioration.  Critical care was time spent personally by me on the following activities: development of treatment plan with patient and/or surrogate as well as nursing, discussions with consultants, evaluation of patient's response to treatment,  examination of patient, obtaining history from patient or surrogate, ordering and performing treatments and interventions, ordering and review of laboratory studies, ordering and review of radiographic studies, pulse oximetry and re-evaluation of patient's condition.   Medications Ordered in ED Medications  sodium chloride flush (NS) 0.9 % injection 3 mL (has no administration in time range)  heparin ADULT infusion 100 units/mL (25000 units/271mL sodium chloride 0.45%) (1,200 Units/hr Intravenous New Bag/Given 02/15/20 0252)  ketorolac (TORADOL) 30 MG/ML injection 15 mg (15 mg Intramuscular Given 02/15/20 0038)  aspirin chewable tablet 324 mg (324 mg Oral Given 02/15/20 0249)  heparin bolus via infusion 4,000 Units (4,000 Units Intravenous Bolus from Bag 02/15/20 0254)    ED Course  I have reviewed the triage vital signs and the nursing notes.  Pertinent labs & imaging results that were available during my care of the patient were reviewed by me and considered in my medical decision making (see chart for details).    MDM Rules/Calculators/A&P                      Patient presents with chest pain.  Onset around 10 PM.  She is overall nontoxic and vital signs are reassuring.  She does describe the pain as pressure.  It is not necessarily exertional.  She does have a history of chest pains in the past and has had a low risk Myoview and reassuring echocardiogram.  Patient was given Toradol.  EKG is normal.  Initial troponin is negative.  No evidence of arrhythmia on EKG either.  Chest x-ray without pneumothorax or pneumonia.  2:45 AM Repeat troponin elevated at 36.  She does have risk factors for ACS.  Heart rate is 59 and she is PERC neg. patient was given full dose aspirin and was started on heparin.  Will consult cardiology.  3:01 AM Patient is pain-free.  Cardiology consulted.  Will plan for admission to the family medicine service.   Final Clinical Impression(s) /  ED Diagnoses Final  diagnoses:  NSTEMI (non-ST elevated myocardial infarction) [facility])    Rx / DC Orders ED Discharge Orders    None       Krystan Northrop, Barbette Hair, MD 02/15/20 [phone]

## 2020-02-15 NOTE — Consult Note (Addendum)
Cardiology Consultation:   Patient ID: Debra Diaz MRN: [id number]; DOB: 1976/02/08  Admit date: 02/14/2020 Date of Consult: 02/15/2020  Primary Care Provider: Kathrene Alu, MD Primary Cardiologist: No primary care provider on file.  Primary Electrophysiologist:  None    Patient Profile:   Debra Diaz is a 44 y.o. female with a hx of CAD, atrial myxoma, DM and HTN who is being seen today for the evaluation of chest pain at the request of Dr. Dina Rich.  History of Present Illness:   Debra Diaz was in her usual state of health until this afternoon when she developed chest pain with associated shortness of breath. She endorses being under a great deal of stress as her husband is an alcoholic and she is struggling with that and their 5 children. She had ongoing pain on arrival to the ER that has improved with aspirin and heparin. She denies any nausea, lightheadedness, dizziness, lower leg swelling, orthopnea, PND or palpitations. She previously smoked 1 cigarette per day but quit 4 months ago. Currently chest pain free. She endorses compliance with her home medications but does not check her blood sugars at home unless she feels poorly.   Heart Pathway Score:     Past Medical History:  Diagnosis Date  . Anemia   . Coronary artery disease   . Diabetes mellitus without complication ([place])   . Hypertension   . Morbid obesity ([place])   . 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 [place] and 2011   x 2  . LEFT HEART CATHETERIZATION WITH CORONARY ANGIOGRAM N/A 03/02/2015   Procedure: LEFT HEART CATHETERIZATION WITH CORONARY ANGIOGRAM;  Surgeon: [name], MD;  Location: [facility] CATH LAB;  Service: Cardiovascular;  Laterality: N/A;  . MINIMALLY INVASIVE EXCISION OF ATRIAL MYXOMA N/A 03/04/2015   Procedure: MINIMALLY INVASIVE RESECTION OF LEFT ATRIAL MYXOMA (  USING A BILAYER PATCH CLOSURE);  Surgeon: [name], MD;  Location: [place];  Service: Open Heart Surgery;  Laterality: N/A;  . myxoma N/A    chest  . TEE WITHOUT CARDIOVERSION N/A 03/01/2015   Procedure: TRANSESOPHAGEAL ECHOCARDIOGRAM (TEE);  Surgeon: [name], MD;  Location: [facility] ENDOSCOPY;  Service: Cardiovascular;  Laterality: N/A;  . TEE WITHOUT CARDIOVERSION N/A 03/04/2015   Procedure: TRANSESOPHAGEAL ECHOCARDIOGRAM (TEE);  Surgeon: [name], MD;  Location: [place];  Service: Open Heart Surgery;  Laterality: N/A;  . TUBAL LIGATION  2011     Home Medications:  Prior to Admission medications   Medication Sig Start Date End Date Taking? Authorizing Provider  imiquimod (ALDARA) 5 % cream Apply topically 3 (three) times a week. Apply at bedtime and wash off in the morning.  Use for up to 8 weeks or until infection is gone. 02/11/20   Kathrene Alu, MD  lisinopril-hydrochlorothiazide (PRINZIDE,ZESTORETIC) 20-25 MG tablet TAKE 1 TABLET BY MOUTH DAILY 10/01/18   Everrett Coombe, MD  metFORMIN (GLUCOPHAGE) 500 MG tablet TAKE 1 TABLET(500 MG) BY MOUTH DAILY WITH BREAKFAST 07/29/19   Kathrene Alu, MD    Inpatient Medications: Scheduled Meds: . [START ON 02/16/2020] aspirin EC  81 mg Oral Daily  . sodium chloride flush  3 mL Intravenous Once   Continuous Infusions: . heparin 1,200 Units/hr (02/15/20 0252)   PRN Meds: acetaminophen, nitroGLYCERIN, ondansetron (ZOFRAN) IV  Allergies:   No Known Allergies  Social History:   Social History  Socioeconomic History  . Marital status: Married    Spouse name: Not on file  . Number of children: Not on file  . Years of education: Not on file  . Highest education level: Not on file  Occupational History  . Not on file  Tobacco Use  . Smoking status: Former Smoker    Quit date: 11/17/2017    Years since quitting: 2.2  . Smokeless tobacco: Never Used  Substance and Sexual Activity  . Alcohol use: Yes    Comment: occasional   . Drug use: No  . Sexual activity: Yes    Birth control/protection: Surgical  Other Topics Concern  . Not on file  Social History Narrative  . Not on file   Social Determinants of Health   Financial Resource Strain:   . Difficulty of Paying Living Expenses: Not on file  Food Insecurity:   . Worried About Charity fundraiser in the Last Year: Not on file  . Ran Out of Food in the Last Year: Not on file  Transportation Needs:   . Lack of Transportation (Medical): Not on file  . Lack of Transportation (Non-Medical): Not on file  Physical Activity:   . Days of Exercise per Week: Not on file  . Minutes of Exercise per Session: Not on file  Stress:   . Feeling of Stress : Not on file  Social Connections:   . Frequency of Communication with Friends and Family: Not on file  . Frequency of Social Gatherings with Friends and Family: Not on file  . Attends Religious Services: Not on file  . Active Member of Clubs or Organizations: Not on file  . Attends Archivist Meetings: Not on file  . Marital Status: Not on file  Intimate Partner Violence:   . Fear of Current or Ex-Partner: Not on file  . Emotionally Abused: Not on file  . Physically Abused: Not on file  . Sexually Abused: Not on file    Family History:    Family History  Problem Relation Age of Onset  . Hypertension Mother   . Diabetes Father   . Asthma Son   . Breast cancer Maternal Grandmother      ROS:  Please see the history of present illness.  All other ROS reviewed and negative.     Physical Exam/Data:   Vitals:   02/15/20 0245 02/15/20 0300 02/15/20 0315 02/15/20 0345  BP: 115/65 123/67 118/79 134/78  Pulse: (!) 51 (!) 51 (!) 48 (!) 50  Resp: 11 19 13 14   Temp:      TempSrc:      SpO2: 99% 100% 99% 99%  Weight:      Height:       No intake or output data in the 24 hours ending 02/15/20 0412 Last 3 Weights 02/14/2020 02/11/2020 10/30/2018  Weight (lbs) 220 lb 7.4 oz 207 lb 6.4 oz 190 lb    Weight (kg) 100 kg 94.076 kg 86.183 kg     Body mass index is 37.84 kg/m.  General:  Morbidly obese 44 yo , in no acute distress HEENT: normal Lymph: no adenopathy Neck: no JVD   Cardiac:  Bradycardic, regular rhythm. normal S1, S2; no murmurs,  No rub   Lungs:  clear to auscultation bilaterally, no wheezing, rhonchi or rales  Abd: soft, nontender, no hepatomegaly  Ext: no edema Musculoskeletal:  No deformities, BUE and BLE strength normal and equal Skin: warm and dry  Neuro:  CNs 2-12 intact,  no focal abnormalities noted Psych:  Normal affect    Telemetry:  Telemetry was personally reviewed and demonstrates:  Sinus bradycardia 60  Relevant CV Studies: Nuclear stress test 09/13/16  Nuclear stress EF: 58%.  There was no ST segment deviation noted during stress.  The study is normal.  This is a low risk study.  The left ventricular ejection fraction is normal (55-65%).   Normal pharmacologic nuclear stress test with no evidence of prior infarct or ischemia.   Echo 01/09/2017 - Left ventricle: The cavity size was normal. Wall thickness was  normal. Systolic function was normal. The estimated ejection  fraction was in the range of 55% to 60%. Wall motion was normal;  there were no regional wall motion abnormalities. Left  ventricular diastolic function parameters were normal.  - Left atrium: The atrium was normal in size.  - Tricuspid valve: There was trivial regurgitation.  - Pulmonary arteries: PA peak pressure: 28 mm Hg (S).  - Inferior vena cava: The vessel was normal in size. The  respirophasic diameter changes were in the normal range (>= 50%),  consistent with normal central venous pressure.   LHC 02/2015  Dominance: Right  Left Main: Very Large caliber vessel that trifurcates into the LAD, Ramus Intermedius, and Left Circumflex. Angiographically normal  LAD: Large-caliber vessel with a very proximal large caliber High First Diagonal Branch. The  LAD has mild diffuse tender 20% lesions but is relatively atrophic normal as it courses down around the apex perfusing the distal third of the inferoapex.   D1: Large caliber high branch with several distal branches. Mild possible luminal irregularities of less than 30%.   D2: Moderate caliber vessel that arises from the mid LAD. It does not cover large distribution but is angiographically normal.  Left Circumflex: Large-caliber, nondominant vessel that courses mostly as a large lateral bifurcating OM branch. The inferior branch is much larger than the superior branch. The distal vessel is tortuous and reaches down almost of the inferoapex. Mild luminal irregularity. There is a very small AV groove branch..   Ramus intermedius: Large caliber vessel that courses is a high OM. It gives off a moderate sized branch from the mid vessel just after a eccentric tubular 30% lesion.. The daughter vessel and parent both reaches almost to the apex. They are somewhat tortuous, but relatively free of disease.   RCA: Large-caliber codominant vessel that gives rise to a significant RV marginal branch in the mid vessel. It bifurcates distally into the Right Posterior Descending Artery (RPDA) and the Right Posterior AV Groove Branch (RPAV). Angiographically normal.   RPDA: Large caliber vessel that reaches two thirds the way to the apex. Angiographically normal.   RPL Sysytem:The RPAV begins as a moderate large vessel that terminates as a moderate caliber posterolateral branch. Angiographically normal. MYVUE   NOVANT AUg 2020  CATH Sept 2020 NOVANT  Findings:  1. Hemodynamics: Aortic pressure 125/75, LVEDP 12 2. Coronary system:  Left Main: Large caliber vessel with no angiographic evidence of  stenosis.  LAD system: Large caliber vessel, wraps around the apex. NO significant  stenosis  LCX system: Large caliber vessel. No significant stenosis.  RCA system: right dominant. No significant  stenosis.  Conclusion: Normal coronaries CAD as mentioned above. Normal LVEDP  Recommendations: Continue risk factor modifications  Laboratory Data:  High Sensitivity Troponin:   Recent Labs  Lab 02/14/20 2342 02/15/20 0153  TROPONINIHS 8 36*     Chemistry Recent Labs  Lab 02/11/20 1548 02/14/20 2342  NA 138 138  K 4.4 3.7  CL 101 103  CO2 25 22  GLUCOSE 131* 173*  BUN 12 17  CREATININE 0.58 0.57  CALCIUM 9.4 9.2  GFRNONAA 112 >60  GFRAA 130 >60  ANIONGAP  --  13    No results for input(s): PROT, ALBUMIN, AST, ALT, ALKPHOS, BILITOT in the last 168 hours. Hematology Recent Labs  Lab 02/11/20 1548 02/14/20 2342  WBC 8.4 11.0*  RBC 5.21 5.11  HGB 12.0 12.0  HCT 39.6 39.6  MCV 76* 77.5*  MCH 23.0* 23.5*  MCHC 30.3* 30.3  RDW 15.9* 17.3*  PLT 346 319   BNPNo results for input(s): BNP, PROBNP in the last 168 hours.  DDimer No results for input(s): DDIMER in the last 168 hours.   Radiology/Studies:  DG Chest 2 View  Result Date: 02/15/2020 CLINICAL DATA:  Chest pain EXAM: CHEST - 2 VIEW COMPARISON:  October 30, 2018 FINDINGS: The heart size and mediastinal contours are within normal limits. Both lungs are clear. The visualized skeletal structures are unremarkable. IMPRESSION: No active cardiopulmonary disease. Electronically Signed   By: Prudencio Pair M.D.   On: 02/15/2020 01:07    TIMI Risk Score for Unstable Angina or Non-ST Elevation MI:   The patient's TIMI risk score is 2, which indicates a 8% risk of all cause mortality, new or recurrent myocardial infarction or need for urgent revascularization in the next 14 days.   Assessment and Plan:   1. CP  Pt presented with CP yesterday   EKG without acute changes  Trivial elevation in troponins Pt with no sgnificant dz by cath in 2016  She had L heart cath in September 2020 that was also normal (Has also had 2 myovues in past )  LVEDP at that time was 12  ? If patient has small vs dz   ? If CP related to GI       REcomm:  Stop heparin Low dose amlodipine 1.25  (BP has been 130s/ at home and yesterday was OK  Follow) Agree with statin Rx    PPI and GI cocktail x 1 now  2  Bradycardia  The pt's HR is on low side  This is not new  Had monitor at [facility]  Had on 3.8 sec pause while sleeping   (Sleep study pending) She complains of fatigue but I am not convinced related to HR  No indication for pacer  No evid that pacer would help Ambulate later this pM and watch HR  3 Lipids   LDL 100  HDL 47  Recomm statin         For questions or updates, please contact [facility] Please consult [email or web address] for contact info under     Signed, Princella Pellegrini, MD  02/15/2020 4:12 AM

## 2020-02-15 NOTE — ED Notes (Signed)
Per Interpreter:  Pt is complaining of CP and SHOB that started an hour ago. Pt also said BP was also increased. Pt has hx of COVID on Jan. 27, but now is negative. CP is centralized and does not radiate.

## 2020-02-15 NOTE — Progress Notes (Signed)
ANTICOAGULATION CONSULT NOTE - Initial Consult  Pharmacy Consult for Heparin Indication: chest pain/ACS  No Known Allergies  Patient Measurements: Height: 5\' 4"  (162.6 cm) Weight: 220 lb 7.4 oz (100 kg) IBW/kg (Calculated) : 54.7 Heparin Dosing Weight: 80 kg  Vital Signs: Temp: 98.9 F (37.2 C) (02/27 2322) Temp Source: Oral (02/27 2322) BP: 114/66 (02/28 0200) Pulse Rate: 59 (02/28 0200)  Labs: Recent Labs    02/14/20 2342 02/15/20 0153  HGB 12.0  --   HCT 39.6  --   PLT 319  --   CREATININE 0.57  --   TROPONINIHS 8 36*    Estimated Creatinine Clearance: 103.1 mL/min (by C-G formula based on SCr of 0.57 mg/dL).   Medical History: Past Medical History:  Diagnosis Date  . Anemia   . Coronary artery disease   . Diabetes mellitus without complication ([place])   . Hypertension   . Morbid obesity ([place])   . 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   Current Outpatient Medications on File Prior to Encounter  Medication Sig Dispense Refill  . imiquimod (ALDARA) 5 % cream Apply topically 3 (three) times a week. Apply at bedtime and wash off in the morning.  Use for up to 8 weeks or until infection is gone. 12 each 1  . lisinopril-hydrochlorothiazide (PRINZIDE,ZESTORETIC) 20-25 MG tablet TAKE 1 TABLET BY MOUTH DAILY 90 tablet 3  . metFORMIN (GLUCOPHAGE) 500 MG tablet TAKE 1 TABLET(500 MG) BY MOUTH DAILY WITH BREAKFAST 90 tablet 3     Assessment: 44 y.o. female with chest pain and SOB for heparin Goal of Therapy:  Heparin level 0.3-0.7 units/ml Monitor platelets by anticoagulation protocol: Yes   Plan:  Heparin 4000 units IV bolus, then start heparin 1200 units/hr Check heparin level in 6 hours.   Taven Strite, Bronson Curb 02/15/2020,2:41 AM

## 2020-02-15 NOTE — Progress Notes (Signed)
Patient ambulated in the hallway at room air.  Oxygen saturation at 98% room air.  Patient's heart rate  60- 70s while ambulating. Patient stated feeling dizzy. Current HR alternating between 40-50s at rest. Patient states: "dizziness is going away but still feels a bit dizzy".  Call bell within reach.  Nurse gave patient references; from advance care reference tab, to read in [place] regarding diabetes, heart healthy diet, low sodium diet, hypertension, fiber intake teaching.   Patient has questions regarding her diabetes, blood pressures and would like to know more about her blood tests.  Nurse communicated this information to MD.  Nutritionist and diabetes consult for pt ordered.  Plan of care discussed with pt. Questions and concerns answered.

## 2020-02-15 NOTE — H&P (Signed)
[facility] Admission History and Physical Service Pager: [phone]  Patient name: Debra Diaz Medical record number: [id number] Date of birth: 1976/02/10 Age: 44 y.o. Gender: female  Primary Care Provider: Kathrene Alu, MD Consultants: Cardiology Code Status: full Preferred Emergency Contact: Radonna Ricker, son, [phone]  Chief Complaint: chest pain  Assessment and Plan: Debra Diaz is a 44 y.o. female presenting with chest pain . PMH is significant for abnormal uterine bleeding, type II diabetes, genital warts, hypertension, morbid obesity, history of resection of left atrial myxoma  NSTEMI/chest pain Patient with minor elevation in high-sensitivity troponin, EKG with no concerning changes.  Does have mild coronary artery disease on left heart cath in 2016 and risk factors and hypertension, diabetes, obesity.  Has already been evaluated by cardiology and plan is to left heart cath versus stress test on 3/1.  Received aspirin 325, will continue daily aspirin.  Heparin drip already started in ED, will continue.  Check hemoglobin A1c, TSH.  Lipid panel already drawn on 2/24 and looks great aside from a mildly elevated LDL of 100.  Other etiologies considered are pulmonary embolus, GERD, pneumonia.  PE unlikely given PERC score 0.  GERD is a possibility but unlikely to improve with Toradol injection.  Pneumonia very unlikely based on normal chest x-ray and exam. -Admit to inpatient family medicine, telemetry floor, Dr. Gwendlyn Deutscher -Cardiac monitoring -Vital signs per floor routine -Carb modified diet, n.p.o. at midnight -Aspirin 81 mg daily -Lipitor 40 mg daily -Check A1c, TSH -Cardiology following appreciate their recs -Intervention per cardiology -Nitro SL 0.4 mg as needed -Tylenol 650 every 4 hours as needed  Type 2 diabetes A1c already resulted at 6.5.  Takes Metformin 500 mg once with breakfast.  Will hold while  inpatient.  We will put on sliding scale insulin.  As an outpatient can likely be increased on her Metformin, will defer to PCP as outpatient.  Hypertension Systolic BP ranging from 123456, most recent 127.  Diastolic persistently in the 70s.  Heart rate persistently between 48 and 53.  Takes lisinopril/hydrochlorothiazide combo tablet at home.  Will hold for now given normotensive nature.  Can likely hold lisinopril during entirety of hospitalization as she may get large contrast dilated later on.  If becomes hypertensive can start her on amlodipine or hydrochlorothiazide.  Hold beta-blocker for now given the bradycardia.  Obesity BMI 37.8.  Will need to follow-up with PCP for further management.  Weight control be important for decreasing her overall cardiac risk down the road.  Genital warts Was prescribed imiquimod cream on 2/24.  Will hold for now, but can restart if looks like it will be a prolonged hospitalization.  Okay to restart when she goes home.  Hyperlipidemia Has history of hyperlipidemia.  Lipid panel drawn on 2/24 only significant for LDL of 100.  FEN/GI: Carb modified diet Prophylaxis: On heparin drip  Disposition: To telemetry  History of Present Illness:  Debra Diaz is a 44 y.o. female presenting with 1 day history of substernal chest pain.  Started abruptly at approximately 2200 on 2/27.  The pain started when she was cleaning.  No exacerbating factors, unable to get any relief at home Tylenol.  Pain initially described as 6 out of 10 sharp.  Had initial troponin bump from 8->36.  EKG sinus rhythm.  Given the troponin bump she was started on a heparin drip and given aspirin 325 mg.  Cardiology was consulted by the ED.  Of note the patient  has history of left heart cath which showed some coronary artery disease.  Has risk factors in the form of hypertension, obesity, type 2 diabetes.  Patient also endorses being under a little stress recently.  On top of  the heparin drip the patient was given Toradol 15 mg.  This did improve her pain significantly and is pain-free at time of exam.  TSH, CBC, BMP drawn in ED and were negative.  Review Of Systems: Per HPI with the following additions:   ROS  Patient Active Problem List   Diagnosis Date Noted  . Chest pain 02/15/2020  . Possible exposure to STD 02/11/2020  . Abnormal uterine bleeding 02/11/2020  . Genital warts 02/11/2020  . Constipation 01/09/2018  . Breast pain, right 10/03/2017  . Irregular menses 07/02/2017  . Leukocytosis 10/03/2016  . Anxiety reaction 08/15/2016  . Healthcare maintenance 06/12/2016  . Concussion 05/05/2016  . 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 Morbid obesity ([place])   . Chest pain with high risk of acute coronary syndrome 02/27/2015  . Nonintractable episodic headache 02/26/2015  . Elevated troponin I level 02/26/2015  . HTN (hypertension) 02/26/2015  . DM2 (diabetes mellitus, type 2) ([place]) 02/26/2015    Past Medical History: Past Medical History:  Diagnosis Date  . Anemia   . Coronary artery disease   . Diabetes mellitus without complication ([place])   . Hypertension   . Morbid obesity ([place])   . 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 [place] and 2011   x 2  . LEFT HEART CATHETERIZATION WITH CORONARY ANGIOGRAM N/A 03/02/2015   Procedure: LEFT HEART CATHETERIZATION WITH CORONARY ANGIOGRAM;  Surgeon: [name], MD;  Location[facility] CATH LAB;  Service: Cardiovascular;  Laterality: N/A;  . MINIMALLY INVASIVE EXCISION OF ATRIAL MYXOMA N/A 03/04/2015   Procedure: MINIMALLY INVASIVE RESECTION OF LEFT ATRIAL MYXOMA ( USING A BILAYER PATCH CLOSURE);  Surgeon: [name], MD;  Location: [place];  Service: Open Heart Surgery;  Laterality: N/A;  . myxoma N/A    chest  . TEE WITHOUT CARDIOVERSION N/A 03/01/2015    Procedure: TRANSESOPHAGEAL ECHOCARDIOGRAM (TEE);  Surgeon: [name], MD;  Location: [facility] ENDOSCOPY;  Service: Cardiovascular;  Laterality: N/A;  . TEE WITHOUT CARDIOVERSION N/A 03/04/2015   Procedure: TRANSESOPHAGEAL ECHOCARDIOGRAM (TEE);  Surgeon: [name], MD;  Location: [place];  Service: Open Heart Surgery;  Laterality: N/A;  . TUBAL LIGATION  2011    Social History: Social History   Tobacco Use  . Smoking status: Former Smoker    Quit date: 11/17/2017    Years since quitting: 2.2  . Smokeless tobacco: Never Used  Substance Use Topics  . Alcohol use: Yes    Comment: occasional  . Drug use: No   Additional social history:  Please also refer to relevant sections of EMR.  Family History: Family History  Problem Relation Age of Onset  . Hypertension Mother   . Diabetes Father   . Asthma Son   . Breast cancer Maternal Grandmother     Allergies and Medications: No Known Allergies No current facility-administered medications on file prior to encounter.   Current Outpatient Medications on File Prior to Encounter  Medication Sig Dispense Refill  . imiquimod (ALDARA) 5 % cream Apply topically 3 (three) times a week. Apply at bedtime and wash off in the morning.  Use  for up to 8 weeks or until infection is gone. 12 each 1  . lisinopril-hydrochlorothiazide (PRINZIDE,ZESTORETIC) 20-25 MG tablet TAKE 1 TABLET BY MOUTH DAILY 90 tablet 3  . metFORMIN (GLUCOPHAGE) 500 MG tablet TAKE 1 TABLET(500 MG) BY MOUTH DAILY WITH BREAKFAST 90 tablet 3    Objective: BP 118/79   Pulse (!) 48   Temp 98.9 F (37.2 C) (Oral)   Resp 13   Ht 5\' 4"  (1.626 m)   Wt 100 kg   LMP 01/22/2020   SpO2 99%   BMI 37.84 kg/m  Exam: General: 44 year old [place] female, no acute distress, resting comfortably Cardiovascular: Regular rate and rhythm, palpable radial pulses bilaterally Respiratory: Lungs clear to auscultation bilaterally, no accessory muscle use Gastrointestinal: Soft,  nontender, nondistended Derm: Skin warm and dry Neuro: No focal neurologic deficit Psych: Very pleasant  Labs and Imaging: CBC BMET  Recent Labs  Lab 02/14/20 2342  WBC 11.0*  HGB 12.0  HCT 39.6  PLT 319   Recent Labs  Lab 02/14/20 2342  NA 138  K 3.7  CL 103  CO2 22  BUN 17  CREATININE 0.57  GLUCOSE 173*  CALCIUM 9.2     EKG: Sinus rhythm  Guadalupe Dawn, MD 02/15/2020, 3:32 AM PGY-3, [place] Intern pager: [phone], text pages welcome

## 2020-02-16 ENCOUNTER — Other Ambulatory Visit: Payer: Self-pay | Admitting: Family Medicine

## 2020-02-16 DIAGNOSIS — R072 Precordial pain: Secondary | ICD-10-CM | POA: Diagnosis not present

## 2020-02-16 DIAGNOSIS — R079 Chest pain, unspecified: Secondary | ICD-10-CM | POA: Diagnosis not present

## 2020-02-16 DIAGNOSIS — Z23 Encounter for immunization: Secondary | ICD-10-CM | POA: Diagnosis not present

## 2020-02-16 DIAGNOSIS — E119 Type 2 diabetes mellitus without complications: Secondary | ICD-10-CM | POA: Diagnosis not present

## 2020-02-16 DIAGNOSIS — R001 Bradycardia, unspecified: Secondary | ICD-10-CM | POA: Diagnosis not present

## 2020-02-16 LAB — CBC
HCT: 37.8 % (ref 36.0–46.0)
Hemoglobin: 11.3 g/dL — ABNORMAL LOW (ref 12.0–15.0)
MCH: 23.6 pg — ABNORMAL LOW (ref 26.0–34.0)
MCHC: 29.9 g/dL — ABNORMAL LOW (ref 30.0–36.0)
MCV: 78.9 fL — ABNORMAL LOW (ref 80.0–100.0)
Platelets: 285 10*3/uL (ref 150–400)
RBC: 4.79 MIL/uL (ref 3.87–5.11)
RDW: 17.6 % — ABNORMAL HIGH (ref 11.5–15.5)
WBC: 8 10*3/uL (ref 4.0–10.5)
nRBC: 0 % (ref 0.0–0.2)

## 2020-02-16 LAB — PROTIME-INR
INR: 1 (ref 0.8–1.2)
Prothrombin Time: 13 seconds (ref 11.4–15.2)

## 2020-02-16 LAB — GLUCOSE, CAPILLARY: Glucose-Capillary: 124 mg/dL — ABNORMAL HIGH (ref 70–99)

## 2020-02-16 LAB — BASIC METABOLIC PANEL
Anion gap: 10 (ref 5–15)
BUN: 13 mg/dL (ref 6–20)
CO2: 23 mmol/L (ref 22–32)
Calcium: 8.7 mg/dL — ABNORMAL LOW (ref 8.9–10.3)
Chloride: 106 mmol/L (ref 98–111)
Creatinine, Ser: 0.61 mg/dL (ref 0.44–1.00)
GFR calc Af Amer: >60 mL/min (ref >60)
GFR calc non Af Amer: >60 mL/min (ref >60)
Glucose, Bld: 141 mg/dL — ABNORMAL HIGH (ref 70–99)
Potassium: 4.5 mmol/L (ref 3.5–5.1)
Sodium: 139 mmol/L (ref 135–145)

## 2020-02-16 MED ORDER — ACCU-CHEK GUIDE W/DEVICE KIT: 1.0000 | PACK | Freq: Once | 0 refills | Status: AC

## 2020-02-16 MED ORDER — PANTOPRAZOLE SODIUM 40 MG PO TBEC: 40.0000 mg | DELAYED_RELEASE_TABLET | Freq: Every day | ORAL | 0 refills | Status: DC

## 2020-02-16 MED ORDER — BLOOD GLUCOSE MONITOR KIT: PACK | 0 refills | Status: DC

## 2020-02-16 MED ORDER — AMLODIPINE BESYLATE 2.5 MG PO TABS: 1.2500 mg | ORAL_TABLET | Freq: Every day | ORAL | 0 refills | Status: DC

## 2020-02-16 MED ORDER — ASPIRIN 81 MG PO TBEC: 81.0000 mg | DELAYED_RELEASE_TABLET | Freq: Every day | ORAL | Status: DC

## 2020-02-16 MED ORDER — ATORVASTATIN CALCIUM 40 MG PO TABS: 40.0000 mg | ORAL_TABLET | Freq: Every day | ORAL | 0 refills | Status: DC

## 2020-02-16 MED ORDER — METFORMIN HCL 500 MG PO TABS: ORAL_TABLET | ORAL | 3 refills | Status: DC

## 2020-02-16 MED ORDER — ACCU-CHEK GUIDE VI STRP: ORAL_STRIP | 12 refills | Status: DC

## 2020-02-16 NOTE — Discharge Summary (Addendum)
[facility] Discharge Summary  Patient name: Debra Diaz Medical record number: [id number] Date of birth: 03-02-1976 Age: 44 y.o. Gender: female Date of Admission: 02/14/2020  Date of Discharge: 02/16/2020 Admitting Physician: Kinnie Feil, MD  Primary Care Provider: Kathrene Alu, MD Consultants: Cardiology  Indication for Hospitalization: Chest pain rule out  Discharge Diagnoses/Problem List:  NSTEMI/chest pain Type 2 diabetes Hypertension Obesity Genital warts Hyperlipidemia  Disposition: Home  Discharge Condition: Stable  Discharge Exam:  Temp:  [97.8 F (36.6 C)-98.4 F (36.9 C)] 98.3 F (36.8 C) (03/01 0429) Pulse Rate:  [42-58] 42 (03/01 0429) Resp:  [18-20] 18 (03/01 0429) BP: (98-130)/(56-74) 119/67 (03/01 0429) SpO2:  [99 %-100 %] 99 % (03/01 0429) Weight:  [94.5 kg] 94.5 kg (03/01 0429) Physical Exam: General: Resting comfortably in bed, no acute distress Cardiovascular: Bradycardic, no murmurs appreciated Respiratory: Clear to auscultation bilaterally, no murmurs appreciated Abdomen: Soft, nontender, positive bowel sounds Extremities: Atraumatic, no edema noted  Brief Hospital Course:  Patient presented to the emergency department for chest pain.  She was worked up and was found to have no ST elevations and a mildly elevated high-sensitivity troponin.  There was consideration that the pain may be from cardiac source versus GI.  Troponins were trended and down trended.  Cardiology was consulted and decided not to do catheterization due to recent catheterization in September 2020 which was negative.  They recommended continuation of aspirin, statin, and low-dose amlodipine following discharge.  Regarding the patient's bradycardia, cardiology recommended no pacemaker at this time.  PT and OT evaluated and recommended close to 24-hour initial supervision given patient's dizziness.  Verified with patient that she would  have 24 hr supervision at home.  No outpatient OT follow-up recommended.  Issues for Follow Up:  1. Schedule cardiology follow-up with Dr. Harrington Challenger in 4 to 6 weeks 2. Follow-up with PCP and inform on changes to medications including aspirin, statin, low-dose amlodipine 3. Consider further work-up on bradycardia  Significant Procedures: None  Significant Labs and Imaging:  Recent Labs  Lab 02/11/20 1548 02/14/20 2342 02/16/20 0355  WBC 8.4 11.0* 8.0  HGB 12.0 12.0 11.3*  HCT 39.6 39.6 37.8  PLT 346 319 285   Recent Labs  Lab 02/11/20 1548 02/11/20 1548 02/14/20 2342 02/16/20 0355  NA 138  --  138 139  K 4.4   < > 3.7 4.5  CL 101  --  103 106  CO2 25  --  22 23  GLUCOSE 131*  --  173* 141*  BUN 12  --  17 13  CREATININE 0.58  --  0.57 0.61  CALCIUM 9.4  --  9.2 8.7*   < > = values in this interval not displayed.  Hemoglobin A1c-6.5 TSH-2.818 High-sensitivity troponin-36> 48> 34  Results/Tests Pending at Time of Discharge: None  Discharge Medications:  Allergies as of 02/16/2020   No Known Allergies     Medication List    STOP taking these medications   lisinopril-hydrochlorothiazide 20-25 MG tablet Commonly known as: ZESTORETIC     TAKE these medications   Accu-Chek Guide test strip Generic drug: glucose blood Use as instructed   aspirin 81 MG EC tablet Take 1 tablet (81 mg total) by mouth daily.   blood glucose meter kit and supplies Kit Dispense based on patient and insurance preference. Use up to four times daily as directed. (FOR ICD-9 250.00, 250.01).   ibuprofen 600 MG tablet Commonly known as: ADVIL Take 600 mg  by mouth every 6 (six) hours as needed (pain).   imiquimod 5 % cream Commonly known as: Aldara Apply topically 3 (three) times a week. Apply at bedtime and wash off in the morning.  Use for up to 8 weeks or until infection is gone. What changed:   how much to take  when to take this  additional instructions   metFORMIN 500 MG  tablet Commonly known as: GLUCOPHAGE TAKE 1 TABLET(500 MG) BY MOUTH DAILY WITH BREAKFAST What changed:   how much to take  how to take this  when to take this  additional instructions   VITAMIN D PO Take 1 tablet by mouth daily.     ASK your doctor about these medications   Accu-Chek Guide w/Device Kit 1 Device by Does not apply route once for 1 dose. Ask about: Should I take this medication?       Discharge Instructions: Please refer to Patient Instructions section of EMR for full details.  Patient was counseled important signs and symptoms that should prompt return to medical care, changes in medications, dietary instructions, activity restrictions, and follow up appointments.   Follow-Up Appointments: Follow-up Information    Fay Records, MD Follow up on 03/22/2020.   Specialty: Cardiology Why: 2:20pm Contact information: [address] [place] [address] [phone]        Kathrene Alu, MD. Go on 02/20/2020.   Specialty: Family Medicine Why: at 9:30 am.  Please arrive 15 minutes before your appointment.  If you have symptoms of COVID-19 or known exposure to COVID-19, please call the clinic at the number above and let us know.  Contact information: [address] [place] [address] [phone]           Gifford Shave, MD 02/17/2020, 2:28 PM PGY-1, [facility]  I have discussed the above with the original author and agree with their documentation. My edits for correction/addition/clarification were made.  Please see also any attending notes.   [place] Constable, D.O. PGY-2, [facility] 02/19/2020 2:03 PM

## 2020-02-16 NOTE — Progress Notes (Addendum)
Inpatient Diabetes Program Recommendations  AACE/ADA: New Consensus Statement on Inpatient Glycemic Control (2015)  Target Ranges:  Prepandial:   less than 140 mg/dL      Peak postprandial:   less than 180 mg/dL (1-2 hours)      Critically ill patients:  140 - 180 mg/dL   Results for Debra Diaz, Debra Diaz (MRN [id number]) as of 02/16/2020 10:34  Ref. Range 02/15/2020 06:36 02/15/2020 12:32 02/15/2020 16:35 02/15/2020 21:11  Glucose-Capillary Latest Ref Range: 70 - 99 mg/dL 107 (H) 128 (H)  1 unit NOVOLOG  126 (H)  1 unit NOVOLOG  126 (H)   Results for Debra Diaz, Debra Diaz (MRN [id number]) as of 02/16/2020 10:34  Ref. Range 02/16/2020 06:11  Glucose-Capillary Latest Ref Range: 70 - 99 mg/dL 124 (H)  1 unit NOVOLOG    Results for Debra Diaz, Debra Diaz (MRN [id number]) as of 02/16/2020 10:34  Ref. Range 10/02/2017 15:30 02/15/2020 04:00  Hemoglobin A1C Latest Ref Range: 4.8 - 5.6 % 6.0 6.5 (H)  (139 mg/dl)    Admit with: CP/ NSTEMI  History: Type 2 DM  Home DM Meds: Metformin 500 mg Daily  Current Orders: CBG checks only (Novolog SSI d/c'd this AM)  PCP: Dr. Maia Breslow with [facility]     Note Current A1c is 6.5% (up slightly from 6% back in October 2018)  Note MD notes state that they may have PCP increase her outpatient Metformin for home   CBGs well controlled so far in hospital--Novolog SSI d/c'd.    Addendum 12:15pm- Met w/ pt today (used [employer] interpreter machine for translation with [facility] [phone]).  Reviewed the following: CBG goals for home When to check CBGs Diabetes diet for home Current A1c and goal A1c Difference between Type 1 and Type 2 diabetes Importance of exercise  Discussed DM diet information with patient.  Encouraged patient to avoid beverages with sugar (regular soda, sweet tea, lemonade, fruit juice) and to consume mostly water.  Discussed what foods contain carbohydrates and how carbohydrates affect the  body's blood sugar levels.  Encouraged patient to be careful with her portion sizes (especially grains, starchy vegetables, and fruits).    Patient does not have CBG meter at home.  Asked MD to please give pt a rx for CBG at time of d/c.  MD agreeable.    --Will follow patient during hospitalization--  Wyn Quaker RN, MSN, CDE Diabetes Coordinator Inpatient Glycemic Control Team Team Pager: [phone] (8a-5p)

## 2020-02-16 NOTE — Evaluation (Addendum)
Occupational Therapy Evaluation Patient Details Name: Debra Diaz MRN: [id number] DOB: 02-13-1976 Today's Date: 02/16/2020    History of Present Illness Pt is a 44 y.o. female presenting with chest pain. PMHx is significant for abnormal uterine bleeding, type II diabetes, hypertension, morbid obesity, history of resection of left atrial myxoma. Admitted for possible NSTEMI   Clinical Impression   This 44 y/o female presents with the above. PTA pt independent with ADL, iADL and functional mobility. Pt currently limited due to mild unsteadiness with mobility and intermittent onset of dizziness during mobility tasks. Pt completing both room and hallway level mobility with overall minA (HHA, +2 utilized for safety). Pt denies dizziness with initial sitting/standing but with onset of dizziness with ascending single stair; pt able to descend and mobilize a short distance to chair for seated rest (suspect likely due to positional change as pt reports no dizziness until stepping up), reports dizziness subsides with seated rest, and reports improvements in dizziness compared to yesterday when mobilizing with nursing staff. She is currently performing ADL tasks in room at supervision - minguard assist level. Pt will benefit from continued acute OT services to maximize her overall safety and independence with ADL and mobility prior to return home. Do not anticipate pt will require follow up OT services after discharge.     Follow Up Recommendations  No OT follow up;Supervision - Intermittent(close to 24hr initially for pt safety)    Equipment Recommendations  None recommended by OT           Precautions / Restrictions Precautions Precautions: Fall Precaution Comments: intermittent dizziness with positional changes  Restrictions Weight Bearing Restrictions: No      Mobility Bed Mobility Overal bed mobility: Modified Independent             General bed mobility comments: HOB  elevated, no assist required, denies dizziness with sitting up   Transfers Overall transfer level: Needs assistance Equipment used: None Transfers: Sit to/from Stand Sit to Stand: Supervision         General transfer comment: close supervision for safety and immediate standing balance, safety also given pt with dizziness when mobilizing with RN yesterday. Pt denies dizziness with initial standing    Balance Overall balance assessment: Needs assistance Sitting-balance support: Feet supported Sitting balance-Leahy Scale: Good     Standing balance support: Single extremity supported;No upper extremity supported Standing balance-Leahy Scale: Fair Standing balance comment: use of single UE support/HHA for mobility tasks                           ADL either performed or assessed with clinical judgement   ADL Overall ADL's : Needs assistance/impaired Eating/Feeding: Sitting;Independent   Grooming: Standing;Supervision/safety;Min guard   Upper Body Bathing: Supervision/ safety;Sitting   Lower Body Bathing: Supervison/ safety;Sit to/from stand   Upper Body Dressing : Set up;Supervision/safety;Sitting   Lower Body Dressing: Supervision/safety;Sit to/from stand Lower Body Dressing Details (indicate cue type and reason): supervision for standing balance given recent episodes of dizziness, pt donning socks EOB without difficulty  Toilet Transfer: Ambulation;Minimal assistance Toilet Transfer Details (indicate cue type and reason): HHA, simulated via room level mobility Toileting- Clothing Manipulation and Hygiene: Supervision/safety;Sit to/from stand       Functional mobility during ADLs: Minimal assistance(HHA)                           Pertinent Vitals/Pain Pain Assessment: No/denies pain  Hand Dominance     Extremity/Trunk Assessment Upper Extremity Assessment Upper Extremity Assessment: Overall WFL for tasks assessed   Lower Extremity  Assessment Lower Extremity Assessment: Defer to PT evaluation   Cervical / Trunk Assessment Cervical / Trunk Assessment: Normal   Communication Communication Communication: Prefers language other than English([place] ID [id number])   Cognition Arousal/Alertness: Awake/alert Behavior During Therapy: WFL for tasks assessed/performed Overall Cognitive Status: Within Functional Limits for tasks assessed                                     General Comments  pt with episode of dizziness with ascending x1 step, suspect likely due to positional change, pt able to descend and mobilize a short distance to chair for seated rest, HR in the high 50s (pt has been bradycardic per RN) and SpO2 99%, reports improvements with seated rest.     Exercises     Shoulder Instructions      Home Living Family/patient expects to be discharged to:: Private residence Living Arrangements: Children(4 children, most teenage/adult age) Available Help at Discharge: Family;Friend(s) Type of Home: House Home Access: Stairs to enter Technical brewer of Steps: 3   Home Layout: One level     Bathroom Shower/Tub: Teacher, early years/pre: Standard     Home Equipment: None   Additional Comments: pt reports has a friend visiting currently who can assist       Prior Functioning/Environment Level of Independence: Independent        Comments: driving, working some (cleaning)        OT Problem List: Decreased strength;Decreased activity tolerance;Impaired balance (sitting and/or standing);Cardiopulmonary status limiting activity      OT Treatment/Interventions: Self-care/ADL training;Therapeutic exercise;Energy conservation;DME and/or AE instruction;Therapeutic activities;Cognitive remediation/compensation;Patient/family education;Balance training    OT Goals(Current goals can be found in the care plan section) Acute Rehab OT Goals Patient Stated Goal: less dizziness  OT  Goal Formulation: With patient Time For Goal Achievement: 03/01/20 Potential to Achieve Goals: Good  OT Frequency: Min 2X/week   Barriers to D/C:            Co-evaluation PT/OT/SLP Co-Evaluation/Treatment: Yes Reason for Co-Treatment: For patient/therapist safety(pt with onset of dizziness with mobility yesterday )   OT goals addressed during session: ADL's and self-care      AM-PAC OT "6 Clicks" Daily Activity     Outcome Measure Help from another person eating meals?: None Help from another person taking care of personal grooming?: None Help from another person toileting, which includes using toliet, bedpan, or urinal?: A Little Help from another person bathing (including washing, rinsing, drying)?: A Little Help from another person to put on and taking off regular upper body clothing?: None Help from another person to put on and taking off regular lower body clothing?: A Little 6 Click Score: 21   End of Session Nurse Communication: Mobility status  Activity Tolerance: Patient tolerated treatment well Patient left: in chair;with call bell/phone within reach;with family/visitor present;with nursing/sitter in room  OT Visit Diagnosis: Unsteadiness on feet (R26.81);Dizziness and giddiness (R42)                Time: [id number] OT Time Calculation (min): 18 min Charges:  OT General Charges $OT Visit: 1 Visit OT Evaluation $OT Eval Moderate Complexity: 1 Mod  Lou Cal, OT [employer] Pager [phone] Office [phone]   Raymondo Band 02/16/2020, 12:21 PM

## 2020-02-16 NOTE — Discharge Instructions (Addendum)
Debra Diaz Los Debra son un problema muy frecuente. Causan sensacin de inestabilidad o de desvanecimiento. Puede sentir que se va a desmayar. Los [employer] Corporation pueden provocarle una lesin si se tropieza o se cae. La causa puede deberse a [employer], tales como los siguientes:  Medicamentos.  No tener suficiente agua en el cuerpo (deshidratacin).  Enfermedad. Siga estas indicaciones en su casa: Comida y bebida   Beba suficiente lquido para mantener el pis (orina) claro o de color amarillo plido. Esto evita la deshidratacin. Trate de beber ms lquidos transparentes, como agua.  No beba alcohol.  Limite la cantidad de cafena que bebe o come si el mdico se lo indica.  Limite la cantidad de sal (sodio) que bebe o come si el mdico se lo indica. Actividad   Evite los movimientos rpidos. ? Cuando se levante de una silla, sujtese hasta sentirse bien. ? Por la maana, sintese primero a un lado de la cama. Cuando se sienta bien, pngase lentamente de pie mientras se sostiene de algo. Haga esto hasta que se sienta seguro en cuanto al equilibrio.  Mueva las piernas con frecuencia si debe estar de pie en un lugar durante mucho tiempo. Mientras est de pie, contraiga y relaje los msculos de las piernas.  No conduzca vehculos ni opere maquinaria pesada si se siente mareado.  Evite agacharse si se siente mareado. En su casa, coloque los objetos en algn lugar que le resulte fcil alcanzarlos sin agacharse. Estilo de vida  No consuma ningn producto que contenga nicotina o tabaco, como cigarrillos y Psychologist, sport and exercise. Si necesita ayuda para dejar de fumar, consulte al mdico.  Intente bajar el nivel de estrs. Para hacerlo, puede usar mtodos como el yoga o la meditacin. Hable con el mdico si necesita ayuda. Instrucciones generales  Controle sus Debra para ver si hay cambios.  Tome los medicamentos de venta libre y los recetados solamente como se lo haya indicado  el mdico. Hable con el mdico si cree que la causa de sus Debra es algn medicamento que est tomando.  Infrmele a un amigo o a un familiar si se siente mareado. Pdale a esta persona que llame al mdico si observa cambios en su comportamiento.  Concurra a todas las visitas de control como se lo haya indicado el mdico. Esto es importante. Comunquese con un mdico si:  Los [employer].  Los [employer] o la sensacin de Engineer, petroleum.  Siente malestar estomacal (nuseas).  Tiene problemas para escuchar.  Aparecen nuevos sntomas.  Siente inestabilidad al estar de pie.  Siente que la Development worker, international aid vueltas. Solicite ayuda de inmediato si:  Vomita o tiene heces acuosas (diarrea), y no puede comer o beber nada.  Tiene dificultad para hacer lo siguiente: ? Hablar. ? Caminar. ? Tragar. ? Usar los brazos, las [place] piernas.  Se siente constantemente dbil.  No piensa con claridad o tiene dificultad para armar oraciones. Es posible que un amigo o un familiar adviertan que esto ocurre.  Tiene los siguientes sntomas: ? Tourist information centre manager. ? Dolor en el vientre (abdomen). ? Falta de aire. ? Sudoracin.  Cambios en la visin.  Sangrado.  Dolor de cabeza muy intenso.  Dolor o rigidez en el cuello.  Cristy Hilts. Estos sntomas pueden Sales executive. No espere hasta que los sntomas desaparezcan. Solicite atencin mdica de inmediato. Comunquese con el servicio de emergencias de su localidad (911 en los [place]). No conduzca por sus propios [place]  Debra causan sensacin de inestabilidad o de desvanecimiento. Puede sentir que se va a desmayar.  Beba suficiente lquido para mantener el pis (orina) claro o de color amarillo plido. No beba alcohol.  Evite los movimientos rpidos si se siente mareado.  Controle sus Debra para ver si hay cambios. Esta informacin no tiene Marine scientist el consejo del  mdico. Asegrese de hacerle al mdico cualquier pregunta que tenga. Document Revised: 06/07/2017 Document Reviewed: 06/07/2017 Elsevier Patient Education  [place] de pecho inespecfico Nonspecific Chest Pain El dolor de pecho puede deberse a muchas afecciones diferentes. Algunas causas del dolor de pecho pueden ser potencialmente mortales. Estas requieren tratamiento inmediato. Algunas causas grave de dolor en el pecho son las siguientes:  Infarto de miocardio.  Un desgarro en el vaso sanguneo principal del cuerpo.  Enrojecimiento e hinchazn (inflamacin) alrededor del corazn.  Un cogulo de [employer]. Otras causas de dolor en el pecho pueden no ser tan graves. Estos incluyen los siguientes:  Merchant navy officer.  Ansiedad o estrs.  Dao en [employer].  Infecciones pulmonares. El dolor de pecho puede provocar las siguientes sensaciones:  Dolor o molestias en el pecho.  Dolor opresivo, continuo o constrictivo.  Ardor u hormigueo.  Dolor sordo o intenso que empeora al [employer], toser o inhalar profundamente.  Dolor o [employer] tambin se sienten en la espalda, el cuello, la [employer], el hombro o el brazo, o dolor que se extiende a cualquiera de estas zonas. Es difcil saber si la causa del dolor es algo grave o algo que no es tan grave. Por lo tanto, es importante que consulte al mdico inmediatamente si tiene Tourist information centre manager. Siga estas indicaciones en su casa: Medicamentos  [employer] de venta libre y los recetados solamente como se lo haya indicado el mdico.  Si le recetaron un antibitico, tmelo como se lo haya indicado el mdico. No deje de tomar los antibiticos aunque comience a Sports administrator. Estilo de vida   Haga reposo como se lo haya indicado el mdico.  No consuma ningn producto que contenga nicotina o tabaco, como cigarrillos, cigarrillos electrnicos y tabaco de Higher education careers adviser. Si  necesita ayuda para dejar de fumar, consulte al mdico.  No beba alcohol.  Haga cambios en su estilo de vida segn las indicaciones del mdico. Estos pueden incluir lo siguiente: ? Practicar actividad fsica con regularidad. Pregntele al mdico qu actividades son seguras para usted. ? Seguir una dieta cardiosaludable. Un especialista en dietas y alimentacin (nutricionista) puede ayudarlo a que haga elecciones saludables. ? Mantener un peso saludable. ? Tratar la diabetes o la presin arterial alta, si es necesario. ? Reducir el estrs. Las [employer] yoga y las tcnicas de relajacin pueden ayudar. Indicaciones generales  Est atento a cualquier cambio en los sntomas. Informe a su mdico si presenta algn cambio en los sntomas o si aparecen sntomas nuevos.  Evite las [employer] causen dolor de [place].  Concurra a todas las visitas de seguimiento como se lo haya indicado el mdico. Esto es importante. Es posible que tenga que someterse a ms estudios si el dolor de pecho no desaparece. Comunquese con un mdico si:  El dolor de pecho no desaparece.  Se siente deprimido.  Tiene fiebre. Solicite ayuda inmediatamente si:  El dolor en el pecho es ms intenso.  Tiene tos que empeora o tose con [place].  Tiene dolor muy intenso en el vientre (abdomen).  Pierde el conocimiento (se desmaya).  Tiene cualquiera de estos sntomas sin ninguna causa clara: ? Tree surgeon repentino en el pecho. ? Molestias repentinas en los brazos, la espalda, el cuello o la [place].  Le falta el aire en cualquier momento.  Comienza a sudar de [place] repentina o la piel se le humedece.  Siente malestar estomacal (nuseas).  Vomita.  Se siente repentinamente mareado o se desmaya.  Se siente muy dbil o cansado.  El corazn comienza a latirle rpidamente o parece que se saltea latidos. Estos sntomas pueden Sales executive. No espere a ver si los sntomas desaparecen.  Solicite atencin mdica de inmediato. Comunquese con el servicio de emergencias de su localidad (911 en los [place]). No conduzca por sus propios medios [employer]. Resumen  El dolor de pecho puede deberse a muchas afecciones diferentes. La causa puede ser grave y requerir tratamiento de inmediato. Si tiene dolor de pecho, consulte al mdico de inmediato.  Siga las indicaciones del mdico para tomar los medicamentos y Field seismologist cambios en su estilo de vida.  Concurra a todas las visitas de seguimiento como se lo haya indicado el mdico. Esto incluye las visitas para realizarle otros estudios si el dolor de pecho no desaparece.  Asegrese de [employer] signos que indican que su afeccin ha empeorado. Obtenga ayuda de inmediato si tiene esos sntomas. Esta informacin no tiene Marine scientist el consejo del mdico. Asegrese de hacerle al mdico cualquier pregunta que tenga. Document Revised: 08/05/2018 Document Reviewed: 08/05/2018 Elsevier Patient Education  2020 [place] en la sangre (glucosa) en tu casa: Antes de comer: 80-130 mg/dl 2 horas despues la comida: less than 180 mg/dl    Contar carbohidratos y la diabetes  Por qu es importante el conteo de carbohidratos?  Vicki Mallet las porciones de carbohidratos ayuda a Freight forwarder nivel de glucosa (azcar) en su sangre para que se sienta mejor.  . El equilibrio entre los carbohidratos que come y Best boy determina el nivel de glucosa que tendr en la sangre despus de comer.  Vicki Mallet carbohidratos tambin le ayudar a planificar sus comidas.   Qu alimentos contienen carbohidratos?  Entre los alimentos con carbohidratos se incluyen:  . Panes, galletas saladas y cereales  . Pastas, arroz y granos  . Vegetales (verduras) con almidn, como papas, elote (maz o choclo) y chcharos (guisantes o arvejas)  . Frijoles (habichuelas) y legumbres  . Leche, leche de soya y Estate agent  . Lambert Mody y jugos de fruta  .  Dulces como pasteles, galletas, helados, mermeladas y jaleas   Porciones de carbohidratos  Al planificar comidas para la diabetes, recuerde que un alimento con 1 porcin de carbohidratos contiene aproximadamente 15 gramos de carbohidratos:  . Revise el tamao de las porciones con tazas y cucharas de medir o con una pesa de alimentos.  [place] los Datos de Nutricin en las etiquetas de los alimentos para saber cuntos gramos de carbohidratos contienen los alimentos que come.   Los Estée Lauder de este folleto muestran porciones que contienen cerca de 15 gramos de carbohidratos. Copyright Academy of Nutrition and Dietetics. This handout may be duplicated for client education. Carbohydrate Counting for Diabetes (Spanish) - 2   Consejos para planificar sus comidas  . Un Plan de Alimentacin indica cuntas porciones de carbohidratos consumir en sus comidas y refrigerios (snacks). Para muchos adultos es adecuado comer 3 a 5 porciones de carbohidratos en cada comida y de 1 a 2  porciones de carbohidratos, en cada refrigerio.  . En un Plan de Alimentacin diaria saludable, la mayora de los carbohidratos provienen de:  o Al menos 6 porciones de frutas y vegetales sin almidn  o Al menos 6 porciones de Engineer, manufacturing, frijoles y Photographer con almidn, con al menos 3 de estas porciones de granos integrales (enteros)  o Al menos 2 porciones de Air traffic controller o productos lcteos  . Revise regularmente su nivel de glucosa en la sangre. Esto puede indicarle si necesita ajustar las horas a las que consume carbohidratos.  . Comer alimentos que contienen [place], como granos [place], y comer muy pocos alimentos salados es bueno para su salud.  . Coma 4 a 6 onzas de carne u otros alimentos con protenas (como hamburguesas de soya) cada da. Elija fuentes de protena bajas en grasa, como carne de res y de cerdo bajas en grasa, pollo, pescado, queso bajo en grasa o alimentos vegetarianos como la soya.  . Coma algunas grasas  saludables, como aceite de [place], de canola y nueces.  . Coma muy pocas grasas saturadas. Estas grasas no son saludables y se Occupational psychologist, la crema y las carnes con mucha grasa, como el tocino (tocineta) y las salchichas o Photographer.  . Coma muy pocas o nada de grasas trans. Estas grasas no son saludables y se encuentran en todos los alimentos que contienen aceites "parcialmente hidrogenados" en su lista de ingredientes.   Consejos para leer etiquetas  En los Datos de Nutricin de las etiquetas aparece una lista con el total de gramos de carbohidratos en una porcin estndar. La porcin estndar puede ser mayor o menor que 1 porcin de carbohidratos. Para saber cuntas porciones de carbohidratos hay en un alimento:  . Primero mire el tamao de la porcin estndar de la etiqueta.  Elveria Royals verifique el total de gramos de carbohidratos. Esta es la cantidad de carbohidratos en 1 porcin estndar. Divida el total de gramos de carbohidratos por 15. Este nmero equivale al nmero de porciones de carbohidratos en 1 porcin estndar. Recuerde: 1 porcin de carbohidratos equivale a 15 gramos de carbohidratos.  . Nota: Puede ignorar los gramos de [employer] Datos de Nutricin, ya que estn incluidos en el total de gramos de carbohidratos.  Copyright Academy of Nutrition and Dietetics. This handout may be duplicated for client education. Carbohydrate Counting for Diabetes (Spanish) - 3   Listas de alimentos para el conteo de carbohidratos  1 porcin = cerca de 15 gramos de carbohidratos  Almidones  . 1 rebanada de pan (1 onza)  . 1 tortilla (6 pulgadas)  .  rosca de pan (bagel) grande (1 onza)  . 2 tortillas para taco (5 pulgadas)  .  pan para hamburguesa o para salchicha (hot dog) (3/4 onza)  .  taza de cereal listo para comer sin endulzar  .  taza de cereal cocido  . 1 taza de sopa a base de caldo  . 4-6 galletitas saladas  . ? taza de pasta o arroz (cocidos)  .  taza de  frijoles, chcharos, granos de elote, camotes (batatas, boniatos), calabaza (zapallo), pur de papas o papas hervidas (cocidos)  .  papa grande asada (3 onzas)  .  onza de pretzels, papitas o totopos (tortilla chips)  . 3 tazas de palomitas de maz Museum/gallery exhibitions officer) (ya preparadas)   Lambert Mody  . 1 fruta fresca pequea ( a 1 taza)  .  taza de fruta enlatada o congelada  . 17 uvas pequeas (3  onzas)  . 1 taza de meln, bayas (moras)  .  vaso de jugo de fruta  . 2 cucharadas de frutas secas (arndanos azules/blueberries, cerezas, arndanos rojos/cranberries, frutas surtidas, uvas pasas/pasitas)  Leche  . 1 taza de [employer] o reducida en grasa  . 1 taza de leche de soya  . ? taza de yogur descremado endulzado con un edulcorante sin azcar (6 onzas)   Dulces y postres  . pastel cuadrado de 2 pulgadas (sin betn/cobertura)  . 2 galletitas dulces (? onzas)  .  taza de helado o yogur congelado  .  taza de sorbete (sherbet) o nieve (sorbet)  . 1 cucharada de jarabe (sirope), mermelada, jalea, azcar o miel  . 2 cucharadas de jarabe bajo en caloras  Copyright Academy of Nutrition and Dietetics. This handout may be duplicated for client education. Carbohydrate Counting for Diabetes (Spanish) - 4   Otros alimentos  . Cuente 1 taza de vegetales crudos o  taza de vegetales sin almidn, cocidas, como porciones de alimentos con cero (0) carbohidratos o "sin restriccin." Si come 3 o ms porciones en una comida, cuntelas como 1 porcin de carbohidratos.  . Los alimentos que contienen menos de 20 caloras en cada porcin tambin pueden contarse como porciones con cero carbohidratos o alimentos "sin restriccin."  . Cuente 1 taza de guiso (estofado) u otros alimentos combinados como 2 porciones de carbohidratos.   Notas: Copyright Academy of Nutrition and Dietetics. This handout may be duplicated for client education. Carbohydrate Counting for Diabetes (Spanish) - 5   Contar carbohidratos y la  diabetes: Ejemplo de men para 1 da Desayuno  1 pltano/banana pequeo (1 carbohidrato)   taza de hojuelas de maz (cornflakes) (1 carbohidrato)  1 taza de leche descremada o baja en grasa (1 carbohidrato)  1 rebanada de pan de trigo integral (1 carbohidrato)  1 cucharadita de margarina   Almuerzo  2 onzas de rebanadas de [place]  2 rebanadas de pan de trigo integral (2 carbohidratos)  2 hojas de Acupuncturist  4 palitos de apio  4 palitos de zanahoria  1 manzana mediana (1 carbohidrato)  1 taza de leche descremada o baja en grasa (1 carbohidrato)   Refrigerio  2 cucharadas de uvas pasas/pasitas (1 carbohidrato)   onzas de mini pretzels sin sal (1 carbohidrato)   Cena  3 onzas de carne asada de res, magra   papa grande asada (2 carbohidratos)  1 cucharada de crema agria reducida en grasa   taza de ejotes/habichuelas verdes/chauchas  1 taza de ensalada de vegetales  1 cucharada de aderezo para ensaladas reducido en caloras  1 panecillo de trigo integral (1 carbohidrato)  1 cucharadita de margarina  1 taza de bolitas de meln (1 carbohidrato)   Refrigerio  6 onzas de yogur de frutas bajo en grasa, sin azcar (1 carbohidrato)  2 cucharadas de nueces sin sal

## 2020-02-16 NOTE — Progress Notes (Signed)
Nutrition Brief Note  RD consulted for assessment of nutrition requirements/ status.   Wt Readings from Last 15 Encounters:  02/16/20 94.5 kg  02/11/20 94.1 kg  10/30/18 86.2 kg  01/09/18 90.8 kg  10/02/17 91.6 kg  06/28/17 91.8 kg  05/24/17 90.7 kg  12/25/16 93.9 kg  12/22/16 92.5 kg  11/08/16 92.2 kg  10/03/16 93.3 kg  09/13/16 93 kg  09/11/16 93.2 kg  09/01/16 93 kg  08/29/16 93 kg   Debra Diaz is a 44 y.o. female presenting with chest pain . PMH is significant for abnormal uterine bleeding, type II diabetes, genital warts, hypertension, morbid obesity, history of resection of left atrial myxoma  Pt admitted with NSTEMI/ chest pain.   Reviewed I/O's: +640 ml x 24 hours and +704 ml since admission  UOP: 200 ml x 24 hours  Case discussed with RN, who reports that pt will likely discharge home today. She has been using the [employer] interpreter to communicate. Plan for DM coordinator for evaluate pt as well.   Attempted to see pt x 2, however, in with MD at times of visit. RD attached "Carbohydrate Counting for People with Diabetes" handouts (in Spanish- pt preferred language) to AVS/ discharge summary.   Lab Results  Component Value Date   HGBA1C 6.5 (H) 02/15/2020  PTA DM medications are 500 mg metformin daily.   Labs reviewed: CBGS: [id number] (inpatient orders for glycemic control are 00-9 units insulin aspart TID with meals).   Current diet order is carb modified, patient is consuming approximately 100% of meals at this time. Labs and medications reviewed.   No nutrition interventions warranted at this time. If nutrition issues arise, please consult RD.   Loistine Chance, RD, LDN, [place] Registered Dietitian II Certified Diabetes Care and Education Specialist Please refer to [facility] for RD and/or RD on-call/weekend/after hours pager

## 2020-02-16 NOTE — Progress Notes (Signed)
02/16/20 1311  PT Visit Information  Last PT Received On 02/16/20  Assistance Needed +1 (+2 safety/stairs)  PT/OT/SLP Co-Evaluation/Treatment Yes  Reason for Co-Treatment For patient/therapist safety (pt with onset of dizziness with mobility yesterday. )  PT goals addressed during session Mobility/safety with mobility;Balance  History of Present Illness Pt is a 44 y.o. female presenting with chest pain. PMHx is significant for abnormal uterine bleeding, type II diabetes, hypertension, morbid obesity, history of resection of left atrial myxoma. Admitted for possible NSTEMI  Precautions  Precautions Fall  Precaution Comments intermittent dizziness with positional changes   Restrictions  Weight Bearing Restrictions No  Home Living  Family/patient expects to be discharged to: Private residence  [place] (4 children, most teenage/adult age)  Available Help at Discharge Family;Friend(s)  Type of [place] to enter  Entrance Stairs-Number of Steps 3  Entrance Stairs-Rails None  Home Layout One level  Bathroom Therapist, music None  Additional Comments pt reports has a friend visiting currently who can assist   Prior Function  Level of Independence Independent  Comments driving, working some (cleaning)  Engineer, petroleum Prefers language other than English ([place] ID [id number])  Pain Assessment  Pain Assessment No/denies pain  Cognition  Arousal/Alertness Awake/alert  Behavior During Therapy WFL for tasks assessed/performed  Overall Cognitive Status Within Functional Limits for tasks assessed  Upper Extremity Assessment  Upper Extremity Assessment Defer to OT evaluation  Lower Extremity Assessment  Lower Extremity Assessment Generalized weakness  Cervical / Trunk Assessment  Cervical / Trunk Assessment Normal  Bed Mobility  Overal bed mobility Modified Independent   General bed mobility comments HOB elevated, no assist required, denies dizziness with sitting up   Transfers  Overall transfer level Needs assistance  Equipment used None  Transfers Sit to/from Stand  Sit to Stand Supervision;+2 safety/equipment  General transfer comment close supervision for safety and immediate standing balance, safety also given pt with dizziness when mobilizing with RN yesterday. Pt denies dizziness with initial standing  Ambulation/Gait  Ambulation/Gait assistance Min guard;+2 safety/equipment  Gait Distance (Feet) 200 Feet  Assistive device 1 person hand held assist  Gait Pattern/deviations Step-through pattern;Decreased stride length  General Gait Details Pt cautious during gait, and holding to PT hand for comfort. No overt LOB noted.   Gait velocity Decreased  Stairs Yes  Stairs assistance Min guard;Min assist;+2 safety/equipment  Stair Management One rail Right;Step to pattern;Forwards (HHA )  Number of Stairs 1  General stair comments Pt reporting onset of dizziness after stepping up on 1st step; was able to descend and ambulate short distance to chair. Dizziness improved with seated rest.   Balance  Overall balance assessment Needs assistance  Sitting-balance support Feet supported  Sitting balance-Leahy Scale Good  Standing balance support Single extremity supported;No upper extremity supported  Standing balance-Leahy Scale Fair  Standing balance comment use of single UE support/HHA for mobility tasks  General Comments  General comments (skin integrity, edema, etc.) pt with episode of dizziness with ascending x1 step, suspect likely due to positional change, pt able to descend and mobilize a short distance to chair for seated rest, HR in the high 50s (pt has been bradycardic per RN) and SpO2 99%, reports improvements with seated rest.   PT - End of Session  Activity Tolerance Treatment limited secondary to medical complications (Comment) (dizziness)   Patient left in chair;with call bell/phone within reach;with family/visitor present;with nursing/sitter in  room  Nurse Communication Mobility status  PT Assessment  PT Recommendation/Assessment Patient needs continued PT services  PT Visit Diagnosis Dizziness and giddiness (R42);Other abnormalities of gait and mobility (R26.89)  PT Problem List Decreased activity tolerance;Decreased balance;Decreased mobility  PT Plan  PT Frequency (ACUTE ONLY) Min 3X/week  PT Treatment/Interventions (ACUTE ONLY) Gait training;Stair training;Functional mobility training;Therapeutic activities;Therapeutic exercise;Balance training;Patient/family education  AM-PAC PT "6 Clicks" Mobility Outcome Measure (Version 2)  Help needed turning from your back to your side while in a flat bed without using bedrails? 4  Help needed moving from lying on your back to sitting on the side of a flat bed without using bedrails? 4  Help needed moving to and from a bed to a chair (including a wheelchair)? 4  Help needed standing up from a chair using your arms (e.g., wheelchair or bedside chair)? 4  Help needed to walk in hospital room? 3  Help needed climbing 3-5 steps with a railing?  3  6 Click Score 22  Consider Recommendation of Discharge To: Home with no services  PT Recommendation  Follow Up Recommendations No PT follow up;Supervision for mobility/OOB (close to 24/7 initially for pt safety)  PT equipment None recommended by PT  Individuals Consulted  Consulted and Agree with Results and Recommendations Patient  Acute Rehab PT Goals  Patient Stated Goal less dizziness   PT Goal Formulation With patient  Time For Goal Achievement 03/01/20  Potential to Achieve Goals Good  PT Time Calculation  PT Start Time (ACUTE ONLY) 1147  PT Stop Time (ACUTE ONLY) 1205  PT Time Calculation (min) (ACUTE ONLY) 18 min  PT General Charges  $$ ACUTE PT VISIT 1 Visit  PT Evaluation  $PT Eval Moderate Complexity 1 Mod   Pt  admitted secondary to problem above with deficits above. Pt requiring min to min guard A and HHA (+2 for safety) during mobility tasks this session. Pt with dizziness after ascending step; (suspect likely due to positional change as pt reports no dizziness until stepping up), reports dizziness subsides with seated rest, and reports improvements in dizziness compared to yesterday when mobilizing with nursing staff. Feel pt will progress well once symptoms improve. Will continue to follow acutely to maximize functional mobility independence and safety.   Reuel Derby, PT, DPT  Acute Rehabilitation Services  Pager: [phone] Office: [phone]

## 2020-02-16 NOTE — Progress Notes (Signed)
Progress Note  Patient Name: Debra Diaz Date of Encounter: 02/16/2020  Primary Cardiologist: Dr Harrington Challenger  Subjective   History obtained with the assistance of an interpreter.  No CP or dyspnea this AM.  Some dyspnea last evening.  Inpatient Medications    Scheduled Meds: . amLODipine  1.25 mg Oral Daily  . aspirin EC  81 mg Oral Daily  . atorvastatin  40 mg Oral q1800  . enoxaparin (LOVENOX) injection  0.5 mg/kg Subcutaneous Q24H  . influenza vac split quadrivalent PF  0.5 mL Intramuscular Tomorrow-1000  . insulin aspart  0-9 Units Subcutaneous TID WC  . pantoprazole  40 mg Oral Q1200  . polyethylene glycol  17 g Oral BID  . sodium chloride flush  3 mL Intravenous Once   Continuous Infusions:  PRN Meds: acetaminophen, nitroGLYCERIN, ondansetron (ZOFRAN) IV   Vital Signs    Vitals:   02/15/20 1637 02/15/20 2006 02/16/20 0429 02/16/20 0831  BP: (!) 121/56 130/74 119/67 136/78  Pulse: (!) 47 (!) 44 (!) 42 (!) 48  Resp: 20 18 18 18   Temp: 98.3 F (36.8 C) 98.4 F (36.9 C) 98.3 F (36.8 C) 98.3 F (36.8 C)  TempSrc: Oral Oral Oral Oral  SpO2: 100% 99% 99% 98%  Weight:   94.5 kg   Height:        Intake/Output Summary (Last 24 hours) at 02/16/2020 0902 Last data filed at 02/16/2020 0800 Gross per 24 hour  Intake 480 ml  Output 200 ml  Net 280 ml   Last 3 Weights 02/16/2020 02/14/2020 02/11/2020  Weight (lbs) 208 lb 6.4 oz 220 lb 7.4 oz 207 lb 6.4 oz  Weight (kg) 94.53 kg 100 kg 94.076 kg      Telemetry    Sinus Bradycardia- Personally Reviewed  Physical Exam   GEN: No acute distress.   Neck: No JVD Cardiac: RRR, no murmurs, rubs, or gallops.  Respiratory: Clear to auscultation bilaterally. GI: Soft, nontender, non-distended  MS: No edema Neuro:  Nonfocal  Psych: Normal affect   Labs    High Sensitivity Troponin:   Recent Labs  Lab 02/14/20 2342 02/15/20 0153 02/15/20 0400 02/15/20 0608  TROPONINIHS 8 36* 48* 34*      Chemistry  Recent Labs  Lab 02/11/20 1548 02/14/20 2342 02/16/20 0355  NA 138 138 139  K 4.4 3.7 4.5  CL 101 103 106  CO2 25 22 23   GLUCOSE 131* 173* 141*  BUN 12 17 13   CREATININE 0.58 0.57 0.61  CALCIUM 9.4 9.2 8.7*  GFRNONAA 112 >60 >60  GFRAA 130 >60 >60  ANIONGAP  --  13 10     Hematology Recent Labs  Lab 02/11/20 1548 02/14/20 2342 02/16/20 0355  WBC 8.4 11.0* 8.0  RBC 5.21 5.11 4.79  HGB 12.0 12.0 11.3*  HCT 39.6 39.6 37.8  MCV 76* 77.5* 78.9*  MCH 23.0* 23.5* 23.6*  MCHC 30.3* 30.3 29.9*  RDW 15.9* 17.3* 17.6*  PLT 346 319 285    Radiology    DG Chest 2 View  Result Date: 02/15/2020 CLINICAL DATA:  Chest pain EXAM: CHEST - 2 VIEW COMPARISON:  October 30, 2018 FINDINGS: The heart size and mediastinal contours are within normal limits. Both lungs are clear. The visualized skeletal structures are unremarkable. IMPRESSION: No active cardiopulmonary disease. Electronically Signed   By: Prudencio Pair M.D.   On: 02/15/2020 01:07    Patient Profile     44 y.o. female with past medical history of atrial  myxoma, diabetes mellitus, hypertension admitted with chest pain.  Assessment & Plan    1 chest pain-symptoms apparently chronic as she has had intermittent episodes since her atrial myxoma resection.  She had a cardiac catheterization September 2020 with [facility] that showed normal coronary arteries.  She has minimal elevation in troponins but electrocardiogram shows no ST changes.  Pursue further ischemia evaluation at this time.  Continue aspirin, statin and low-dose amlodipine following discharge.  2 bradycardia-patient has bradycardia predominantly in the early a.m. hours while she is asleep.  This has been documented previously.  No history of syncope.  No indication for pacemaker at this time.  3 hyperlipidemia-continue statin.  [facility] will sign off.   Medication Recommendations: Continue present medications at discharge Other recommendations (labs, testing,  etc): No further cardiac testing Follow up as an outpatient: Dr. Harrington Challenger 4 to 6 weeks  For questions or updates, please contact [place] Please consult [email or web address] for contact info under        Signed, Kirk Ruths, MD  02/16/2020, 9:02 AM

## 2020-02-16 NOTE — Progress Notes (Signed)
Family Medicine Teaching Service Daily Progress Note Intern Pager: [phone]  Patient name: Debra Diaz Medical record number: [id number] Date of birth: [birth year] Age: 44 y.o. Gender: female  Primary Care Provider: Kathrene Alu, MD Consultants: Cardiology Code Status: Full  Pt Overview and Major Events to Date:  2/28 patient admitted for possible NSTEMI  Assessment and Plan: Debra Diaz is a 44 y.o. female presenting with chest pain . PMH is significant for abnormal uterine bleeding, type II diabetes, genital warts, hypertension, morbid obesity, history of resection of left atrial myxoma  NSTEMI/chest pain On admission patient had minor elevation of high-sensitivity troponin with an EKG showing no concerning changes.  Patient does have history of coronary artery disease and had a left heart cath in 2016.  Patient also has history of hypertension, diabetes, obesity.  In the emergency department the patient was evaluated by cardiology who plan for left heart cath versus stress test on 3/1 (today).  Hemoglobin A1c was 6.5 on admission, TSH was within normal limits, lipid panel on 2/24 showed mildly elevated LDL of 100 but was otherwise normal. -Cardiology consulted, recommendations appreciated -Discontinue heparin -Amlodipine 1.25 -Continuous cardiac monitoring -Vital per routine -Aspirin 81 mg daily -Lipitor 40 mg daily -PPI and GI cocktail given yesterday  Type 2 diabetes Hemoglobin A1c on admission was 6.5.  Home medications include Metformin 500 mg once with breakfast.  Holding while patient is inpatient. -Sliding scale insulin while inpatient -After discharge PCP should consider increasing Metformin to help better control diabetes  Hypertension Patient's blood pressures have ranged from 98/65-130/74 over the last 24 hours with most recent being 119/67.Pulse has ranged from 42-58.  Home medications include lisinopril/hydrochlorothiazide combination  tablet.  Given patient's normotensive blood pressures since admission we are holding this medication at this time.  If patient becomes hypertensive recommend starting amlodipine or hydrochlorothiazide but would not to give lisinopril given patient has had a large amount of contrast since admission.  Would not recommend beta-blocker given patient's bradycardia.  Obesity BMI on admission was 37.8.  We will follow-up with PCP for further management.  Carb modified diet during admission.    Genital warts Patient was prescribed imiquimod cream on 2/24.  We are holding this medication at this time but if patient is to have prolonged hospitalization we may restart.  Patient is okay to restart cream when she returns home.  Hyperlipidemia Patient has a history of hyperlipidemia.  Most recent lipid panel 2/24 showing elevation of LDL to 100.  FEN/GI: N.p.o. at this time in preparation for left heart cath PPx: Lovenox  Disposition: Plan for discharge this afternoon  Subjective:  Resting comfortably.  Patient reports that overnight she did wake up with substernal chest pain and shortness of breath.  She reports that pain is since resolved.  She feels like her chest pain is doing better than when she was admitted.  Patient reports she chronically wakes up short of breath.  She has had a sleep study done in the fall which showed no apnea.  Objective: Temp:  [97.8 F (36.6 C)-98.4 F (36.9 C)] 98.3 F (36.8 C) (03/01 0429) Pulse Rate:  [42-58] 42 (03/01 0429) Resp:  [18-20] 18 (03/01 0429) BP: (98-130)/(56-74) 119/67 (03/01 0429) SpO2:  [99 %-100 %] 99 % (03/01 0429) Weight:  [94.5 kg] 94.5 kg (03/01 0429) Physical Exam: General: Resting comfortably in bed, no acute distress Cardiovascular: Bradycardic, no murmurs appreciated Respiratory: Clear to auscultation bilaterally, no murmurs appreciated Abdomen: Soft, nontender, positive bowel sounds  Extremities: Atraumatic, no edema  noted  Laboratory: Recent Labs  Lab 02/11/20 1548 02/14/20 2342 02/16/20 0355  WBC 8.4 11.0* 8.0  HGB 12.0 12.0 11.3*  HCT 39.6 39.6 37.8  PLT 346 319 285   Recent Labs  Lab 02/11/20 1548 02/14/20 2342 02/16/20 0355  NA 138 138 139  K 4.4 3.7 4.5  CL 101 103 106  CO2 25 22 23   BUN 12 17 13   CREATININE 0.58 0.57 0.61  CALCIUM 9.4 9.2 8.7*  GLUCOSE 131* 173* 141*   PT-13.0 INR-1.0 Hemoglobin A1c-6.5 TSH-1.18  Imaging/Diagnostic Tests: DG Chest 2 View  Result Date: 02/15/2020 CLINICAL DATA:  Chest pain EXAM: CHEST - 2 VIEW COMPARISON:  October 30, 2018 FINDINGS: The heart size and mediastinal contours are within normal limits. Both lungs are clear. The visualized skeletal structures are unremarkable. IMPRESSION: No active cardiopulmonary disease. Electronically Signed   By: Prudencio Pair M.D.   On: 02/15/2020 01:07   Gifford Shave, MD 02/16/2020, 5:38 AM PGY-1, [place] Intern pager: [phone], text pages welcome

## 2020-02-20 ENCOUNTER — Other Ambulatory Visit: Payer: Self-pay

## 2020-02-20 ENCOUNTER — Ambulatory Visit (INDEPENDENT_AMBULATORY_CARE_PROVIDER_SITE_OTHER): Payer: Medicaid Other | Admitting: Family Medicine

## 2020-02-20 ENCOUNTER — Encounter: Payer: Self-pay | Admitting: Family Medicine

## 2020-02-20 VITALS — BP 118/76 | HR 49 | Wt 207.4 lb

## 2020-02-20 DIAGNOSIS — R001 Bradycardia, unspecified: Secondary | ICD-10-CM | POA: Insufficient documentation

## 2020-02-20 MED ORDER — VITAMIN D3 20 MCG (800 UNIT) PO TABS: 1.0000 | ORAL_TABLET | Freq: Every day | ORAL | 5 refills | Status: DC

## 2020-02-20 NOTE — Patient Instructions (Addendum)
It was nice seeing you today Ms. Debra Diaz!  You were recently seen in the hospital for concern that you had a heart attack, but this appears to be very unlikely because the test for heart damage was not very positive, and you had no other signs of this.  We did find that your heart rate was low, but the heart doctor did not think that you need any procedure to fix this currently.  Because you feel dizzy and tired, I am concerned that your heart rate could be the reason why.  I also think that your blood pressure could be contributing especially since a new medication called amlodipine was started while you were in the hospital.  We will stop this medication today and see you in 1 week.  If you continue to feel dizzy and tired, we will send you to the heart doctor to see if they can work on improving your heart rate.   Please stop amlodipine 2.5 mg daily.  If you have any questions or concerns, please feel free to call the clinic.   Be well,  Dr. Shan Levans

## 2020-02-20 NOTE — Progress Notes (Signed)
    SUBJECTIVE:   Spanish interpreter was present throughout today's entire visit  CHIEF COMPLAINT / HPI:   Dizziness Since discharge from the hospital, patient reports feeling dizzy and tired.  Cannot identify a trigger for her dizziness.  Her dizziness is constant.  She felt similar a few years ago and was prescribed vitamin D, which seemed to help.  Denies nausea, tinnitus, orthostatic symptoms, change in hearing.  She notes that she felt short of breath at times in the hospital around the time when it was noted that her heart rate was in the 30s.  Feels like she has felt more tired and had a slower heart beat after her heart surgery to remove her atrial myxoma.   PERTINENT  PMH / PSH: Type 2 diabetes, hypertension, anxiety  OBJECTIVE:   BP 118/76   Pulse (!) 49   Wt 207 lb 6.4 oz (94.1 kg)   LMP 01/22/2020   SpO2 100%   BMI 35.60 kg/m   General: tired, worried appearing, appears older than stated age Cardiac: regular rhythm, bradycardic, no MRG Respiratory: CTAB, no rhonchi, rales, or wheezing, normal work of breathing Psych: appropriate mood and affect  ASSESSMENT/PLAN:   Bradycardia Patient's dizziness and fatigue are likely due to her bradycardia.  Patient's heart rate was in the high 40s today and on several EKGs from her hospital stay, which were significant for sinus bradycardia.  She was not been on any medication that would slow her heart rate, but amlodipine was recently added, which could be dropping her blood pressure lower and contributing to her symptoms.  We will stop this medication, although I would be surprised if stopping amlodipine 2.5 mg will make much difference.  Patient will follow up with me in 1 week and if she continues to be symptomatic, we will refer her to cardiology for symptomatic bradycardia.     Kathrene Alu, MD [place]

## 2020-02-20 NOTE — Assessment & Plan Note (Signed)
Patient's dizziness and fatigue are likely due to her bradycardia.  Patient's heart rate was in the high 40s today and on several EKGs from her hospital stay, which were significant for sinus bradycardia.  She was not been on any medication that would slow her heart rate, but amlodipine was recently added, which could be dropping her blood pressure lower and contributing to her symptoms.  We will stop this medication, although I would be surprised if stopping amlodipine 2.5 mg will make much difference.  Patient will follow up with me in 1 week and if she continues to be symptomatic, we will refer her to cardiology for symptomatic bradycardia.

## 2020-03-01 ENCOUNTER — Other Ambulatory Visit: Payer: Self-pay

## 2020-03-01 ENCOUNTER — Ambulatory Visit (INDEPENDENT_AMBULATORY_CARE_PROVIDER_SITE_OTHER): Payer: Medicaid Other | Admitting: Family Medicine

## 2020-03-01 ENCOUNTER — Encounter: Payer: Self-pay | Admitting: Family Medicine

## 2020-03-01 VITALS — BP 126/78 | HR 50 | Wt 212.2 lb

## 2020-03-01 DIAGNOSIS — R001 Bradycardia, unspecified: Secondary | ICD-10-CM | POA: Diagnosis not present

## 2020-03-01 DIAGNOSIS — I10 Essential (primary) hypertension: Secondary | ICD-10-CM

## 2020-03-01 NOTE — Progress Notes (Signed)
    SUBJECTIVE:   Spanish interpreter was present throughout entire visit.  CHIEF COMPLAINT / HPI:   Follow up of symptomatic bradycardia Dizziness has improved but continues to feel very fatigued Took her heart rate at home recently and it was 34 bpm, BP was 120/80 No syncope, chest pain, or palpitations Stopped amlodipine 2.5 mg daily as instructed at her last visit  PERTINENT  PMH / PSH: Atrial myxoma s/p resection, hypertension, obesity  OBJECTIVE:   BP 126/78   Pulse (!) 50   Wt 212 lb 3.2 oz (96.3 kg)   SpO2 100%   BMI 36.42 kg/m   General: Appears tired but nontoxic, appears stated age Cardiac: Regular rhythm but bradycardic, no MRG Respiratory: CTAB, no rhonchi, rales, or wheezing, normal work of breathing Skin: no rashes or other lesions, warm and well perfused Psych: appropriate mood and affect     ASSESSMENT/PLAN:   Bradycardia I believe that her fatigue continues to be due to her bradycardia.  We will place a cardiology referral today.  I have messaged Dr. Stanford Breed since he was one of the physicians who saw the patient in the hospital regarding this referral, and he said that she should be referred for follow-up with Dr. Harrington Challenger, so this was indicated in the referral order.  HTN (hypertension) Since patient's blood pressure is well controlled today and on her home readings, we will not add an agent for pressure control.  Will continue to follow.     Kathrene Alu, MD [place]

## 2020-03-01 NOTE — Assessment & Plan Note (Signed)
Since patient's blood pressure is well controlled today and on her home readings, we will not add an agent for pressure control.  Will continue to follow.

## 2020-03-01 NOTE — Assessment & Plan Note (Signed)
I believe that her fatigue continues to be due to her bradycardia.  We will place a cardiology referral today.  I have messaged Dr. Stanford Breed since he was one of the physicians who saw the patient in the hospital regarding this referral, and he said that she should be referred for follow-up with Dr. Harrington Challenger, so this was indicated in the referral order.

## 2020-03-17 ENCOUNTER — Ambulatory Visit: Payer: Medicaid Other | Admitting: Family Medicine

## 2020-03-17 NOTE — Progress Notes (Deleted)
    SUBJECTIVE:   CHIEF COMPLAINT / HPI: Rash  Rash: Reports pruritic rash mainly on her hands but with itching all over her body.  PERTINENT  PMH / PSH: Hypertension, type 2 diabetes, obesity, abnormal uterine bleeding, anxiety  OBJECTIVE:   There were no vitals taken for this visit.  ***  ASSESSMENT/PLAN:   No problem-specific Assessment & Plan notes found for this encounter.     Debra Diaz, [place]

## 2020-03-21 NOTE — Progress Notes (Signed)
Cardiology Office Note   Date:  03/22/2020   ID:  Xaviera, Flaten 1976-01-23, MRN [id number]  PCP:  Kathrene Alu, MD  Cardiologist:   Dorris Carnes, MD   Pt presents for evaluation of CP     History of Present Illness: Debra Diaz is a 44 y.o. female with a history of atial myxoma, DM, HTN and  chest pain  She had a cardiac catheterization in 2020 which was normal     She was recently admitted to [facility]  for CP  EKG negative   Trop were 36, 48,34   I saw her      REcom ASA, Statin and amlodipine.    The pt was interviewed today with assistant of translator  The pt says that she stopped amlodipine because of dizziness She says she occsaionally wakes up with chest pressure/ SOB   Not associated with activity   At other times breathing is OK     Current Meds  Medication Sig  . Accu-Chek FastClix Lancets MISC USE TO CHECK BLOOD SUGAR UP TO FOUR TIMES DAILY  . aspirin EC 81 MG EC tablet Take 1 tablet (81 mg total) by mouth daily.  Marland Kitchen atorvastatin (LIPITOR) 40 MG tablet TAKE 1 TABLET(40 MG) BY MOUTH DAILY AT 6 PM  . blood glucose meter kit and supplies KIT Dispense based on patient and insurance preference. Use up to four times daily as directed. (FOR ICD-9 250.00, 250.01).  . ferrous sulfate 325 (65 FE) MG EC tablet Take 325 mg by mouth in the morning, at noon, and at bedtime.  Marland Kitchen glucose blood (ACCU-CHEK GUIDE) test strip Use as instructed  . metFORMIN (GLUCOPHAGE) 500 MG tablet TAKE 1 TABLET(500 MG) BY MOUTH DAILY WITH BREAKFAST  . pantoprazole (PROTONIX) 40 MG tablet TAKE 1 TABLET BY MOUTH DAILY AT 12 NOON     Allergies:   Patient has no known allergies.   Past Medical History:  Diagnosis Date  . Anemia   . Coronary artery disease   . Diabetes mellitus without complication ([place])   . Hypertension   . Morbid obesity ([place])   . 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 [place] and 2011   x 2  . LEFT HEART CATHETERIZATION WITH CORONARY ANGIOGRAM N/A 03/02/2015   Procedure: LEFT HEART CATHETERIZATION WITH CORONARY ANGIOGRAM;  Surgeon: [name], MD;  Location: [facility] CATH LAB;  Service: Cardiovascular;  Laterality: N/A;  . MINIMALLY INVASIVE EXCISION OF ATRIAL MYXOMA N/A 03/04/2015   Procedure: MINIMALLY INVASIVE RESECTION OF LEFT ATRIAL MYXOMA ( USING A BILAYER PATCH CLOSURE);  Surgeon: [name], MD;  Location: [place];  Service: Open Heart Surgery;  Laterality: N/A;  . myxoma N/A    chest  . TEE WITHOUT CARDIOVERSION N/A 03/01/2015   Procedure: TRANSESOPHAGEAL ECHOCARDIOGRAM (TEE);  Surgeon: [name], MD;  Location: [facility] ENDOSCOPY;  Service: Cardiovascular;  Laterality: N/A;  . TEE WITHOUT CARDIOVERSION N/A 03/04/2015   Procedure: TRANSESOPHAGEAL ECHOCARDIOGRAM (TEE);  Surgeon: [name], MD;  Location: [place];  Service: Open Heart Surgery;  Laterality: N/A;  . TUBAL LIGATION  2011     Social History:  The patient  reports that she quit smoking about 2 years ago. She has never used smokeless tobacco. She reports current alcohol use. She reports that she does not use drugs.   Family History:  The patient's  family history includes Asthma in her son; Breast cancer in her maternal grandmother; Diabetes in her father; Hypertension in her mother.    ROS:  Please see the history of present illness. All other systems are reviewed and  Negative to the above problem except as noted.    PHYSICAL EXAM: VS:  BP 138/84   Pulse (!) 58   Ht 5' 2"  (1.575 m)   Wt 214 lb 9.6 oz (97.3 kg)   SpO2 97%   BMI 39.25 kg/m   GEN:  Morbidly obese 44 yo in no acute distress  HEENT: normal  Neck: no JVD, carotid bruits Cardiac: RRR; no murmurs, rubs, or gallops,no LE  edema  Respiratory:  clear to auscultation bilaterally, normal work of breathing GI: soft, nontender, nondistended, + BS  No hepatomegaly  MS: no deformity Moving all  extremities   Skin: warm and dry, no rash Neuro:  Strength and sensation are intact Psych: euthymic mood, full affect   EKG:  EKG is not ordered today.  CARDIAC STUDIES  Relevant CV Studies: Nuclear stress test 09/13/16  Nuclear stress EF: 58%.  There was no ST segment deviation noted during stress.  The study is normal.  This is a low risk study.  The left ventricular ejection fraction is normal (55-65%).  Normal pharmacologic nuclear stress test with no evidence of prior infarct or ischemia.   Echo 01/09/2017 - Left ventricle: The cavity size was normal. Wall thickness was  normal. Systolic function was normal. The estimated ejection  fraction was in the range of 55% to 60%. Wall motion was normal;  there were no regional wall motion abnormalities. Left  ventricular diastolic function parameters were normal.  - Left atrium: The atrium was normal in size.  - Tricuspid valve: There was trivial regurgitation.  - Pulmonary arteries: PA peak pressure: 28 mm Hg (S).  - Inferior vena cava: The vessel was normal in size. The  respirophasic diameter changes were in the normal range (>= 50%),  consistent with normal central venous pressure.   LHC 02/2015  Dominance: Right  Left Main: Very Large caliber vessel that trifurcates into the LAD, Ramus Intermedius, and Left Circumflex. Angiographically normal  WUJ:[id number] vessel with a very proximal large caliber High First Diagonal Branch. The LAD has mild diffuse tender 20% lesions but is relatively atrophic normal as it courses down around the apex perfusing the distal third of the inferoapex.   D1:Large caliber high branch with several distal branches. Mild possible luminal irregularities of less than 30%.   D2:Moderate caliber vessel that arises from the mid LAD. It does not cover large distribution but is angiographically normal.  Left Circumflex:Large-caliber, nondominant vessel that courses mostly  as a large lateral bifurcating OM branch. The inferior branch is much larger than the superior branch. The distal vessel is tortuous and reaches down almost of the inferoapex. Mild luminal irregularity. There is a very small AV groove branch..   Ramus intermedius:Large caliber vessel that courses is a high OM. It gives off a moderate sized branch from the mid vessel just after a eccentric tubular 30% lesion.. The daughter vessel and parent both reaches almost to the apex. They are somewhat tortuous, but relatively free of disease.   RCA: Large-caliber codominant vessel that gives rise to a significant RV marginal branch in the mid vessel. It bifurcates distally into the Right Posterior Descending Artery (RPDA) and the Right Posterior AV Groove Branch (RPAV). Angiographically normal.   RPDA: Large caliber vessel that reaches  two thirds the way to the apex. Angiographically normal.   RPL Sysytem:The RPAV begins as a moderate large vessel that terminates as a moderate caliber posterolateral branch. Angiographically normal. MYVUE   NOVANT AUg 2020  CATH Sept 2020 NOVANT  Findings:  1. Hemodynamics: Aortic pressure 125/75, LVEDP 12 2. Coronary system:  Left Main: Large caliber vessel with no angiographic evidence of  stenosis.  LAD system: Large caliber vessel, wraps around the apex. NO significant  stenosis  LCX system: Large caliber vessel. No significant stenosis.  RCA system: right dominant. No significant stenosis.  Conclusion: Normal coronaries CAD as mentioned above. Normal LVEDP  Recommendations: Continue risk factor modifications Lipid Panel    Component Value Date/Time   CHOL 165 02/11/2020 1548   TRIG 98 02/11/2020 1548   HDL 47 02/11/2020 1548   CHOLHDL 3.5 02/11/2020 1548   CHOLHDL 4.0 09/01/2016 1607   VLDL 42 (H) 09/01/2016 1607   LDLCALC 100 (H) 02/11/2020 1548      Wt Readings from Last 3 Encounters:  03/22/20 214 lb 9.6 oz (97.3 kg)  03/01/20 212  lb 3.2 oz (96.3 kg)  02/20/20 207 lb 6.4 oz (94.1 kg)      ASSESSMENT AND PLAN:  1  Chest pressure   The patient has had multiple procedures including caths   No CAD  SHe still describes spells where she wakes up with pressure  At caht her LVEDP was elevated    I would recomm 1.  Trial fo imdur 30   And 2   Add lasix 20 ith 10 mEq KCL 1x per week   Watch salt.   F.U in 4 to 6  2  Hx bradycardia   Pt heart rate has been on slower sided  But , I do not think hemodynamically significant  Follw   Current medicines are reviewed at length with the patient today.  The patient does not have concerns regarding medicines.  Signed, Dorris Carnes, MD  03/22/2020 2:57 PM    [facility] [place], [place], [place]  [address] Phone: [phone]; Fax: [phone]

## 2020-03-22 ENCOUNTER — Other Ambulatory Visit: Payer: Self-pay

## 2020-03-22 ENCOUNTER — Encounter: Payer: Self-pay | Admitting: Internal Medicine

## 2020-03-22 ENCOUNTER — Ambulatory Visit (INDEPENDENT_AMBULATORY_CARE_PROVIDER_SITE_OTHER): Payer: Medicaid Other | Admitting: Internal Medicine

## 2020-03-22 VITALS — BP 138/84 | HR 58 | Ht 62.0 in | Wt 214.6 lb

## 2020-03-22 DIAGNOSIS — E1121 Type 2 diabetes mellitus with diabetic nephropathy: Secondary | ICD-10-CM | POA: Diagnosis not present

## 2020-03-22 DIAGNOSIS — I10 Essential (primary) hypertension: Secondary | ICD-10-CM

## 2020-03-22 MED ORDER — FUROSEMIDE 20 MG PO TABS: 20.0000 mg | ORAL_TABLET | ORAL | 3 refills | Status: DC

## 2020-03-22 MED ORDER — POTASSIUM CHLORIDE ER 10 MEQ PO TBCR: 10.0000 meq | EXTENDED_RELEASE_TABLET | ORAL | 1 refills | Status: DC

## 2020-03-22 MED ORDER — ISOSORBIDE MONONITRATE ER 30 MG PO TB24: 30.0000 mg | ORAL_TABLET | Freq: Every day | ORAL | 3 refills | Status: DC

## 2020-03-22 NOTE — Patient Instructions (Signed)
Medication Instructions:  Your physician has recommended you make the following change in your medication:  1.) start IMDUR 30 mg - one tablet by mouth once a day 2.) start furosemide (Lasix) 20 mg - one tablet by mouth once a week 3.) start potassium chloride 10 meq - one tablet by mouth once a week --take together with furosemide   Your physician recommends that you schedule a follow-up appointment in: 6 weeks with Dr. Harrington Challenger.  We will call you to schedule this.  *If you need a refill on your cardiac medications before your next appointment, please call your pharmacy*

## 2020-03-23 ENCOUNTER — Ambulatory Visit: Payer: Medicaid Other | Admitting: Family Medicine

## 2020-03-23 NOTE — Progress Notes (Deleted)
    SUBJECTIVE:   CHIEF COMPLAINT / HPI: Rash on hands, body itching  Mr. Delma Officer is a 44 year old female presenting discussed the following:  Rash:  Health maintenance: Due for pneumonia vaccine, foot exam, ophthalmology exam, urine microalbumin  PERTINENT  PMH / PSH: Hypertension, history of left atrial myxoma, type 2 diabetes, elevated BMI of 39, anxiety  OBJECTIVE:   There were no vitals taken for this visit.  ***  ASSESSMENT/PLAN:   No problem-specific Assessment & Plan notes found for this encounter.     Patriciaann Clan, [place]

## 2020-04-09 ENCOUNTER — Ambulatory Visit: Payer: Medicaid Other | Admitting: Family Medicine

## 2020-04-18 ENCOUNTER — Other Ambulatory Visit: Payer: Self-pay | Admitting: Family Medicine

## 2020-04-26 ENCOUNTER — Ambulatory Visit: Payer: Medicaid Other | Admitting: Dietician

## 2020-04-29 ENCOUNTER — Ambulatory Visit (INDEPENDENT_AMBULATORY_CARE_PROVIDER_SITE_OTHER): Payer: Medicaid Other | Admitting: Family Medicine

## 2020-04-29 ENCOUNTER — Other Ambulatory Visit: Payer: Self-pay

## 2020-04-29 VITALS — BP 150/82 | HR 50 | Wt 214.6 lb

## 2020-04-29 DIAGNOSIS — K5900 Constipation, unspecified: Secondary | ICD-10-CM | POA: Diagnosis present

## 2020-04-29 NOTE — Assessment & Plan Note (Signed)
No red flag symptoms such as blood in the stool, painful defecation, or intractable vomiting. Having BMs so obstruction unlikely. Pancreatitis unlikely with no vomiting or nausea and no constant abdominal pain. Most likely constipation. However, if no improvement I would consider getting CMP to check liver function. - Miralax 1 cap in the mornings and Senna one tablet in the evening - Increase Miralax as needed - Return as needed

## 2020-04-29 NOTE — Patient Instructions (Signed)
It was great to meet/ you today! Thank you for letting me participate in your care!  Today, we discussed your feelings of bloating and abdominal pain. I will treat you for constipation to see if you get better. Please buy both Miralax and Senna show below. Please take Miralax once in the mornings and take Senna one tablet 2-3 hours before bedtime.    Be well, Debra Rutherford, DO PGY-3, [facility]

## 2020-04-29 NOTE — Progress Notes (Signed)
    SUBJECTIVE:   CHIEF COMPLAINT / HPI:   Bloating Patient is a 44y/o female who endorses abdominal bloating for the past 3 weeks. She has tried taking Metamucil but states it doesn't help. She has associated abdominal tightness at times in the lower abdomen. She states she has never had anything like this ever before. She is somewhat worried as she was told she had "water retention" by another doctor and was started on some medications but she doesn't remember what they are and does not have them with her. She denies fever, chills, nausea, vomiting, dysuria, diarrhea, vaginal discharge or discomfort and no new sexual activity. She does endorse being constipated and did have a BM yesterday but states it was very hard and she is not having BMs regularly. No blood in the stool.  PERTINENT  PMH / PSH: HTN, T2DM, Obesity  OBJECTIVE:   BP (!) 150/82   Pulse (!) 50   Wt 214 lb 9.6 oz (97.3 kg)   SpO2 99%   BMI 39.25 kg/m   Gen: NAD Abdomen: soft, diffusely tender, most in the lower quadrants, no rebound tenderness or guarding, no fluid wave appreciated, normal bowel sounds in all 4 quadrants, no rashes Ext: no pitting edema in LE  ASSESSMENT/PLAN:   Constipation No red flag symptoms such as blood in the stool, painful defecation, or intractable vomiting. Having BMs so obstruction unlikely. Pancreatitis unlikely with no vomiting or nausea and no constant abdominal pain. Most likely constipation. However, if no improvement I would consider getting CMP to check liver function. - Miralax 1 cap in the mornings and Senna one tablet in the evening - Increase Miralax as needed - Return as needed     Nuala Alpha, [place]

## 2020-05-13 ENCOUNTER — Ambulatory Visit: Payer: Medicaid Other | Admitting: Skilled Nursing Facility1

## 2020-05-13 ENCOUNTER — Other Ambulatory Visit: Payer: Self-pay | Admitting: Family Medicine

## 2020-05-14 ENCOUNTER — Telehealth: Payer: Self-pay | Admitting: *Deleted

## 2020-05-14 NOTE — Telephone Encounter (Signed)
This medication is no longer being prescribed for Debra Diaz, so no refills are needed.  Thanks.

## 2020-05-14 NOTE — Telephone Encounter (Signed)
Received fax requesting refill on pts lisinopril-HCTZ 20/25 mg tablets [id number] Sig-Take one tablet by mouth daily, did not see on pts current med list.April [employer], CMA

## 2020-05-18 ENCOUNTER — Ambulatory Visit: Payer: Medicaid Other

## 2020-05-21 NOTE — Telephone Encounter (Signed)
Received another Rx for this and contacted pharmacy and informed them of below.Debra Diaz, CMA

## 2020-05-24 NOTE — Progress Notes (Signed)
No Show  Debra Leitz, MD Family Medicine Residency

## 2020-05-25 ENCOUNTER — Ambulatory Visit (INDEPENDENT_AMBULATORY_CARE_PROVIDER_SITE_OTHER): Payer: Medicaid Other | Admitting: Family Medicine

## 2020-05-25 DIAGNOSIS — Z5329 Procedure and treatment not carried out because of patient's decision for other reasons: Secondary | ICD-10-CM

## 2020-06-10 ENCOUNTER — Ambulatory Visit: Payer: Medicaid Other | Admitting: Skilled Nursing Facility1

## 2020-06-28 ENCOUNTER — Ambulatory Visit: Payer: Medicaid Other | Admitting: Family Medicine

## 2020-06-30 ENCOUNTER — Ambulatory Visit: Payer: Medicaid Other | Admitting: Family Medicine

## 2020-06-30 NOTE — Progress Notes (Deleted)
    SUBJECTIVE:   CHIEF COMPLAINT / HPI:   Debra Diaz is a 44 yr old female who presents today for diabetes follow up  Diabetes Takes Metformin 500mg  once daily. Previous A1 was *** side effects.polyuria, polydipsia, blurred vision  HTN Takes   PERTINENT  PMH / PSH: ***  OBJECTIVE:   There were no vitals taken for this visit.  ***  ASSESSMENT/PLAN:   No problem-specific Assessment & Plan notes found for this encounter.     Lattie Haw, MD [place]

## 2020-07-12 ENCOUNTER — Other Ambulatory Visit: Payer: Self-pay | Admitting: Family Medicine

## 2020-07-12 NOTE — Telephone Encounter (Signed)
Discontinued 02/16/20

## 2020-07-12 NOTE — Telephone Encounter (Signed)
Contacted pharmacy again and told them pt is no longer taking this medication.Debra Diaz, CMA

## 2020-07-13 NOTE — Patient Instructions (Addendum)
It was nice meeting you today!  Your EKG was normal. We will get blood work today and I will call you with results. It is important to keep taking your medications as prescribed.  Please message me with your dose of lisinopril.  You need to schedule appointment with your cardiologist.  If your chest pain worsens in frequency and intensity please go to the emergency department for evaluation. If you have any questions or concerns, please feel free to call the clinic at [phone]   Be well,  Carollee Leitz, MD [facility]    Cmo controlar su hipertensin Managing Your Hypertension La hipertensin se denomina usualmente presin arterial alta. Ocurre cuando la sangre presiona contra las paredes de las arterias con demasiada fuerza. Las arterias son los vasos sanguneos que transportan la sangre desde el corazn hacia todas las partes del cuerpo. La hipertensin hace que el corazn haga ms esfuerzo para Chiropodist y [employer] que las arterias se Teacher, music o Advertising account executive. La hipertensin no tratada o no controlada puede causar infarto de miocardio, accidente cerebrovascular, enfermedad renal y otros problemas. Qu son las Futures trader de presin arterial? Una lectura de la presin arterial consiste de un nmero ms alto sobre un nmero ms bajo. En condiciones ideales, la presin arterial debe estar por debajo de 120/80. El primer nmero ("superior") es la presin sistlica. Es la medida de la presin de las arterias cuando el corazn late. El segundo nmero ("inferior") es la presin diastlica. Es la medida de la presin en las arterias cuando el corazn se relaja. Qu significa mi lectura de presin arterial? La presin arterial se clasifica en cuatro etapas. Sobre la base de la lectura de su presin arterial, el mdico puede usar las siguientes etapas para determinar si necesita tratamiento y de qu tipo. La presin sistlica y la presin diastlica se miden en una unidad  llamada mm Hg. Normal  Presin sistlica: por debajo de 258.  Presin diastlica: por debajo de 80. Elevada  Presin sistlica: 527-782.  Presin diastlica: por debajo de 80. Etapa 1 de hipertensin  Presin sistlica: 423-536.  Goal for EKG  Presin diastlica: 14-43. Etapa 2 de hipertensin  Presin sistlica: 154 o ms.  Presin diastlica: 90 o ms. Cules son los riesgos para la salud asociados con la hipertensin? Controlar la hipertensin es una responsabilidad importante. La hipertensin no controlada puede causar:  Infarto de miocardio.  Accidente cerebrovascular.  Debilitamiento de los vasos sanguneos (aneurisma).  Insuficiencia cardaca.  Dao renal.  Dao ocular.  Sndrome metablico.  Problemas de memoria y concentracin. Qu cambios puedo hacer para controlar mi hipertensin? La hipertensin se puede controlar haciendo [employer] Corporation estilo de vida y, posiblemente, tomando medicamentos. Su mdico le ayudar a crear un plan para bajar la presin arterial al rango normal. Comida y bebida   Siga una dieta con alto contenido de fibras y [place], y con bajo contenido de sal (sodio), azcar agregada y Daphene Jaeger. Un ejemplo de plan alimenticio es la dieta DASH (Dietary Approaches to Stop Hypertension, Mtodos alimenticios para detener la hipertensin). Para alimentarse de esta manera: ? Coma mucha fruta y [place]. Trate de que la mitad del plato de cada comida sea de frutas y verduras. ? Coma cereales integrales, como pasta integral, arroz integral y pan integral. Llene aproximadamente un cuarto del plato con cereales integrales. ? Consuma productos lcteos con bajo contenido de grasa. ? Evite la ingesta de cortes de carne grasa, carne procesada o curada, y carne de ave  con piel. Llene aproximadamente un cuarto del plato con protenas magras, como pescado, pollo sin piel, frijoles, huevos y tofu. ? Evite ingerir alimentos prehechos o procesados. En  general, estos tienen mayor cantidad de sodio, azcar agregada y Wendee Copp.  Reduzca su ingesta diaria de sodio. La mayora de las personas que tienen hipertensin deben comer menos de 1500 mg de sodio por [employer].  Limite el consumo de alcohol a no ms de 1 medida por da si es mujer y no est Music therapist y a 2 medidas por da si es hombre. Una medida equivale a 12onzas de cerveza, 5onzas de vino o 1onzas de bebidas alcohlicas de alta graduacin. Estilo de vida  Trabaje con su mdico para mantener un peso saludable o Administrator, Civil Service. Pregntele cual es su peso recomendado.  Realice al menos 30 minutos de ejercicio que haga que se acelere su corazn (ejercicio Arboriculturist) la [employer] de la [place]. Estas actividades pueden incluir caminar, nadar o andar en bicicleta.  Incluya ejercicios para fortalecer sus msculos (ejercicios de resistencia), como levantamiento de pesas, como parte de su rutina semanal de ejercicios. Intente realizar 16minutos de este tipo de ejercicios al [employer] a la [place].  No consuma ningn producto que contenga nicotina o tabaco, como cigarrillos y Psychologist, sport and exercise. Si necesita ayuda para dejar de fumar, consulte al [employer].  Controle las enfermedades a largo plazo (crnicas), como el colesterol alto o la diabetes. Control  Contrlese la presin arterial en su casa segn las indicaciones del mdico. La presin arterial deseada puede variar en funcin de las enfermedades, la edad y otros factores personales.  Contrlese la presin arterial de manera regular, en la frecuencia indicada por su mdico. Trabaje con su mdico  Revise con su mdico todos los medicamentos que toma ya que puede haber efectos secundarios o interacciones.  Hable con su mdico acerca de la dieta, hbitos de ejercicio y otros factores del estilo de vida que pueden contribuir a la hipertensin.  Consulte a su mdico regularmente. Su mdico puede ayudarle a crear y [place] su plan  para controlar la hipertensin. Debo tomar un medicamento para controlar mi presin arterial? El mdico puede recetarle medicamentos si los cambios en el estilo de vida no son suficientes para Child psychotherapist la presin arterial y si:  Su presin arterial sistlica es de 295 o ms.  Su presin arterial diastlica es de 80 o ms. Tome los medicamentos solamente como se lo haya indicado el mdico. Siga cuidadosamente las indicaciones. Los medicamentos para la presin arterial deben tomarse segn las indicaciones. Los medicamentos pierden eficacia al omitir las dosis. El hecho de omitir las dosis tambin [place] el riesgo de otros problemas. Comunquese con un mdico si:  Piensa que tiene Nurse, mental health a los medicamentos que ha tomado.  Tiene dolores de cabeza frecuentes (recurrentes).  Siente mareos.  Tiene hinchazn en los tobillos.  Tiene problemas de visin. Solicite ayuda de inmediato si:  Siente un dolor de cabeza intenso o confusin.  Siente debilidad inusual, adormecimiento o que Geneticist, molecular.  Siente un dolor intenso en el pecho o el abdomen.  Vomita repetidas veces.  Tiene dificultad para respirar. Resumen  La hipertensin se produce cuando la sangre bombea en las arterias con mucha fuerza. Si esta afeccin no se controla, podra correr riesgo de tener complicaciones graves.  La presin arterial deseada puede variar en funcin de las enfermedades, la edad y otros factores personales. Para la [employer], [place] presin arterial normal  es menor que 120/80.  La hipertensin se puede controlar mediante cambios en el estilo de vida, tomando medicamentos, o ambas cosas. Los McDonald's Corporation estilo de vida incluyen prdida de peso, ingerir alimentos sanos, seguir una dieta baja en sodio, hacer ms ejercicio y Environmental consultant consumo de alcohol. Esta informacin no tiene Marine scientist el consejo del mdico. Asegrese de hacerle al mdico cualquier pregunta que  tenga. Document Revised: 02/12/2017 Document Reviewed: 11/15/2016 Elsevier Patient Education  2020 [place] seen

## 2020-07-13 NOTE — Progress Notes (Signed)
    SUBJECTIVE:   CHIEF COMPLAINT / HPI: Medication refill   Chest Pain - Endorses chest pressure x 1 week.. Similar symptoms in February and was admitted to hospital.  Seen by cards, no STEMI.   - Pressure is  midsternal, does not radiate to jaw, back or shoulder and is intermittent.  - Denies any nausea or vomiting.   Hypertension: - Medications: Lisinopril 20-25(Likely combo but patient unsure), potassium.  Not taking Lasix or Imdur  - Compliance: Takes daily - Checking BP at home: Yes, SBP 120s - Denies any, vision changes, LE edema,or symptoms of hypotension - Exercise: Started exercising 2 weeks ago  Chronic Diabetes Disease Monitoring  Blood Sugar Ranges: 120s-140s  Polyuria: yes   Visual problems: no   Last hemoglobin A1C:  Lab Results  Component Value Date   HGBA1C 6.1 07/14/2020    Medication Compliance: yes  Medication Side Effects  Hypoglycemia: no   Preventitive Health Care  Eye Exam: referral today  Foot Exam: referral today  Exercise: recently started exercise 2 weeks ago   PERTINENT  PMH / PSH:  DM type 2 HTN HLD Bradycardia GERD  OBJECTIVE:   BP (!) 132/74   Pulse 46   Wt (!) 211 lb (95.7 kg)   LMP 07/13/2020 (Exact Date)   SpO2 98%   BMI 38.59 kg/m    General: Alert and oriented, no apparent distress  Cardiovascular: RRR with no murmurs noted Respiratory: CTA bilaterally   Derm: No rashes noted Psych: Behavior and speech appropriate to situation  ASSESSMENT/PLAN:   Chest pain Differentials include PE, but less likely given PERC score 0. Low suspicion for ACS unlikely given normal ECG, stable vital signs and TIMI Risk Index low. Likely related to GERD as patient not taking prescribed Protonix with history of GERD -ECG SB, no STEMI -Lipid panel -Restart Imdur 30 mg daily and Lasix 20 mg daily -Clarified patient taking Lisinopril-HCTZ 20-25mg  dail, refill sent to pharmacy - Bmet and CBC labs - Restart Protonix 40mg  daily - Follow  up in 1 week  HTN (hypertension) BP today 132/74.  Has not had her blood pressure medications in 2-3 days, ran out of meds. - Restart Lisinoril-HCTZ 20-25mg , Lasix - Bmet today - Monitor BP at home - Follow up in 1 week  DM2 (diabetes mellitus, type 2) (HCC) Does not check sugars at home.  Noticing more urinary output.  Currently on Lisinopril-HCTZ. - HbA1c 6.1 today - Lipid Panel - Restart Metformin - Follow up ion 1 week  Healthcare maintenance Refer to Podiatry Refer to Opthalmology Previously Received COVID x2 Book for PAP Discuss Colonoscopy at next visit     Carollee Leitz, MD [place]

## 2020-07-14 ENCOUNTER — Ambulatory Visit (INDEPENDENT_AMBULATORY_CARE_PROVIDER_SITE_OTHER): Payer: Medicaid Other | Admitting: Family Medicine

## 2020-07-14 ENCOUNTER — Other Ambulatory Visit: Payer: Self-pay

## 2020-07-14 ENCOUNTER — Ambulatory Visit (HOSPITAL_COMMUNITY)
Admission: RE | Admit: 2020-07-14 | Discharge: 2020-07-14 | Disposition: A | Discharge status: Home / Self Care | Payer: Medicaid Other | Source: Ambulatory Visit | Attending: Family Medicine | Admitting: Family Medicine

## 2020-07-14 ENCOUNTER — Encounter: Payer: Self-pay | Admitting: Family Medicine

## 2020-07-14 VITALS — BP 132/74 | HR 46 | Wt 211.0 lb

## 2020-07-14 DIAGNOSIS — G8929 Other chronic pain: Secondary | ICD-10-CM | POA: Diagnosis not present

## 2020-07-14 DIAGNOSIS — E1159 Type 2 diabetes mellitus with other circulatory complications: Secondary | ICD-10-CM | POA: Diagnosis not present

## 2020-07-14 DIAGNOSIS — Z0000 Encounter for general adult medical examination without abnormal findings: Secondary | ICD-10-CM | POA: Diagnosis not present

## 2020-07-14 DIAGNOSIS — I10 Essential (primary) hypertension: Secondary | ICD-10-CM | POA: Insufficient documentation

## 2020-07-14 DIAGNOSIS — R079 Chest pain, unspecified: Secondary | ICD-10-CM | POA: Diagnosis not present

## 2020-07-14 DIAGNOSIS — E119 Type 2 diabetes mellitus without complications: Secondary | ICD-10-CM

## 2020-07-14 DIAGNOSIS — R0789 Other chest pain: Secondary | ICD-10-CM | POA: Diagnosis not present

## 2020-07-14 LAB — POCT GLYCOSYLATED HEMOGLOBIN (HGB A1C): HbA1c, POC (controlled diabetic range): 6.1 % (ref 0.0–7.0)

## 2020-07-14 MED ORDER — ISOSORBIDE MONONITRATE ER 30 MG PO TB24: 30.0000 mg | ORAL_TABLET | Freq: Every day | ORAL | 3 refills | Status: DC

## 2020-07-14 MED ORDER — FUROSEMIDE 20 MG PO TABS: 20.0000 mg | ORAL_TABLET | ORAL | 3 refills | Status: DC

## 2020-07-14 MED ORDER — PANTOPRAZOLE SODIUM 40 MG PO TBEC: 40.0000 mg | DELAYED_RELEASE_TABLET | Freq: Every day | ORAL | 2 refills | Status: DC

## 2020-07-14 MED ORDER — METFORMIN HCL 500 MG PO TABS: ORAL_TABLET | ORAL | 3 refills | Status: DC

## 2020-07-14 MED ORDER — BLOOD GLUCOSE MONITOR KIT: PACK | 0 refills | Status: DC

## 2020-07-14 MED ORDER — LISINOPRIL-HYDROCHLOROTHIAZIDE 20-25 MG PO TABS: 1.0000 | ORAL_TABLET | Freq: Every day | ORAL | 0 refills | Status: DC

## 2020-07-14 NOTE — Assessment & Plan Note (Signed)
Refer to Podiatry Refer to Opthalmology Previously Received COVID x2 Book for PAP Discuss Colonoscopy at next visit

## 2020-07-14 NOTE — Telephone Encounter (Signed)
Patient returns call to nurse line stating that she threw original bottle of lisinopril away. Called and spoke with pharmacist that verified patient has been taking lisinopril-hctz 20/25 mg tablets. Pended medication to encounter.   To PCP  Talbot Grumbling, RN

## 2020-07-14 NOTE — Assessment & Plan Note (Addendum)
Differentials include PE, but less likely given PERC score 0. Low suspicion for ACS unlikely given normal ECG, stable vital signs and TIMI Risk Index low. Likely related to GERD as patient not taking prescribed Protonix with history of GERD -ECG SB, no STEMI -Lipid panel -Restart Imdur 30 mg daily and Lasix 20 mg daily -Clarified patient taking Lisinopril-HCTZ 20-25mg  dail, refill sent to pharmacy - Bmet and CBC labs - Restart Protonix 40mg  daily - Follow up in 1 week

## 2020-07-14 NOTE — Assessment & Plan Note (Addendum)
BP today 132/74.  Has not had her blood pressure medications in 2-3 days, ran out of meds. - Restart Lisinoril-HCTZ 20-25mg , Lasix - Bmet today - Monitor BP at home - Follow up in 1 week

## 2020-07-14 NOTE — Assessment & Plan Note (Addendum)
Does not check sugars at home.  Noticing more urinary output.  Currently on Lisinopril-HCTZ. - HbA1c 6.1 today - Lipid Panel - Restart Metformin - Follow up ion 1 week

## 2020-07-16 LAB — CBC
Hematocrit: 42.4 % (ref 34.0–46.6)
Hemoglobin: 14 g/dL (ref 11.1–15.9)
MCH: 29.1 pg (ref 26.6–33.0)
MCHC: 33 g/dL (ref 31.5–35.7)
MCV: 88 fL (ref 79–97)
Platelets: 282 10*3/uL (ref 150–450)
RBC: 4.81 x10E6/uL (ref 3.77–5.28)
RDW: 15.4 % (ref 11.7–15.4)
WBC: 8.2 10*3/uL (ref 3.4–10.8)

## 2020-07-16 LAB — BASIC METABOLIC PANEL
BUN/Creatinine Ratio: 20 (ref 9–23)
BUN: 12 mg/dL (ref 6–24)
CO2: 20 mmol/L (ref 20–29)
Calcium: 9.1 mg/dL (ref 8.7–10.2)
Chloride: 101 mmol/L (ref 96–106)
Creatinine, Ser: 0.59 mg/dL (ref 0.57–1.00)
GFR calc Af Amer: 128 mL/min/{1.73_m2} (ref >59)
GFR calc non Af Amer: 111 mL/min/{1.73_m2} (ref >59)
Glucose: 112 mg/dL — ABNORMAL HIGH (ref 65–99)
Potassium: 4.5 mmol/L (ref 3.5–5.2)
Sodium: 138 mmol/L (ref 134–144)

## 2020-07-16 LAB — HCV INTERPRETATION

## 2020-07-16 LAB — HCV AB W REFLEX TO QUANT PCR: HCV Ab: 0.2 s/co ratio (ref 0.0–0.9)

## 2020-08-17 ENCOUNTER — Other Ambulatory Visit: Payer: Self-pay

## 2020-08-17 ENCOUNTER — Other Ambulatory Visit: Payer: Self-pay | Admitting: Family Medicine

## 2020-08-17 ENCOUNTER — Ambulatory Visit
Admission: RE | Admit: 2020-08-17 | Discharge: 2020-08-17 | Disposition: A | Discharge status: Home / Self Care | Payer: Medicaid Other | Source: Ambulatory Visit | Attending: Family Medicine | Admitting: Family Medicine

## 2020-08-17 DIAGNOSIS — Z0000 Encounter for general adult medical examination without abnormal findings: Secondary | ICD-10-CM

## 2020-08-17 DIAGNOSIS — N63 Unspecified lump in unspecified breast: Secondary | ICD-10-CM

## 2020-09-09 ENCOUNTER — Ambulatory Visit: Payer: Self-pay | Admitting: Podiatry

## 2020-09-10 ENCOUNTER — Other Ambulatory Visit: Payer: Self-pay

## 2020-09-10 ENCOUNTER — Ambulatory Visit (INDEPENDENT_AMBULATORY_CARE_PROVIDER_SITE_OTHER): Payer: Medicaid Other | Admitting: Family Medicine

## 2020-09-10 ENCOUNTER — Encounter: Payer: Self-pay | Admitting: Family Medicine

## 2020-09-10 VITALS — BP 106/70 | HR 50 | Ht 62.0 in | Wt 214.6 lb

## 2020-09-10 DIAGNOSIS — E1159 Type 2 diabetes mellitus with other circulatory complications: Secondary | ICD-10-CM

## 2020-09-10 DIAGNOSIS — F411 Generalized anxiety disorder: Secondary | ICD-10-CM

## 2020-09-10 DIAGNOSIS — N644 Mastodynia: Secondary | ICD-10-CM

## 2020-09-10 MED ORDER — HYDROXYZINE HCL 10 MG PO TABS: 5.0000 mg | ORAL_TABLET | Freq: Every evening | ORAL | 0 refills | Status: DC | PRN

## 2020-09-10 NOTE — Patient Instructions (Signed)
Thank you for coming to see me today. It was a pleasure.   Please follow up with your therapist as soon as possible  Please follow-up with me in 4 weeks  If you have any questions or concerns, please do not hesitate to call the office at [phone].  Best,   Carollee Leitz, [place] Providers (No Insurance required or Self Pay)  [facility]  [address] [place], [place] [facility] [phone]  [facility]) can see uninsured folks for outpatient therapy [email or web address] [address] [place], [place] [address] [phone]  [place] Mon-Fri, 8am-3pm [email or web address], [place], [place]   [place] [phone]- [facility] for psych med management, there may be a wait- if [facility] is working with clients for OPT, they will coordinate with [place] for [place]   Walk-in-Clinic: Monday- Friday 9:00 AM - 4:00 PM [place], [place] [phone]  [facility] [place] ([employer]) walk in M-F 8am-12pm and  1pm-3pm [place]  [place]  Phone: [phone]  [employer] ([place] and substance challenges) [address], [place] [phone]    [email or web address], [place]     Phone:  [phone] [place]  [place]  [place]  [phone] [email or web address]   [facility] ( virtual or zoom therapy) [email or web address]  [place]  [place]    [facility] [phone]  grief counseling, dementia and caregiver  support    [facility] Walk-in MWF 12:30 to 3:00     [place] [address]  [phone]  [email or web address] call to schedule an appointment    [place] ,Support group, Peer support services, [address], [place], [place] [address] [phone]  [email or web address]           [facility] on Mental Illness (NAMI) [place]- Wellness classes, Support groups        [address], [place], [place] [address] [phone]   CurrentJokes.cz   [facility]  (Psycho-social Rehabilitation clubhouse, Individual and group therapy) [address],  [address]   [phone]  24- Hour Availability:  *[place] or [phone] * [facility] [employer] (Domestic Violence, Rape, etc. )[phone] [place] [phone] or [phone] * [place] [phone] only) [phone] (after hours) *[facility] Mobile Crisis Unit [phone] *[place] National Suicide Hotline [phone] Diamantina Monks)

## 2020-09-13 ENCOUNTER — Encounter: Payer: Self-pay | Admitting: Family Medicine
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
## 2020-09-13 NOTE — Assessment & Plan Note (Signed)
Likely chest pain secondary to Anxiety related given recent stressful events at home.  Considered ACS, TIMI score 1. -Offered CCM referral but patient declined today -Mental health resources provided -Atarax 5 mg qhs prn -Strict return precautions provided -Follow up appointment Oct 28

## 2020-09-13 NOTE — Assessment & Plan Note (Signed)
RUQ tenderness.  No nipple discharge or skin changes.  Screening mammogram recommended diagnostic imaging. -Appointment for diagnostic mammogram Oct 11 -Follow up as needed

## 2020-09-24 ENCOUNTER — Ambulatory Visit (INDEPENDENT_AMBULATORY_CARE_PROVIDER_SITE_OTHER): Payer: Medicaid Other | Admitting: Podiatry

## 2020-09-24 ENCOUNTER — Other Ambulatory Visit: Payer: Self-pay

## 2020-09-24 ENCOUNTER — Encounter: Payer: Self-pay | Admitting: Podiatry

## 2020-09-24 DIAGNOSIS — M722 Plantar fascial fibromatosis: Secondary | ICD-10-CM

## 2020-09-24 NOTE — Progress Notes (Signed)
Subjective:  Patient ID: Debra Diaz, female    DOB: 27-Oct-1976,  MRN: [id number]  Chief Complaint  Patient presents with  . diabetic foot care    Famiy DR sent her here for a foot exam     44 y.o. female presents with the above complaint.  Patient presents today with a translator for Spanish in regards to her bilateral heel pain.  Patient states that she is a diabetic with A1c of 6.8 however her heels have been hurting for quite some time.  She would like to discuss treatment options.  She states that she has not tried anything for the heel pain.  She has not seen anyone else prior to seeing me.  She would like to discuss if there is anything that can be done to help resolve this.  Her pain scale 7 out of 10 sharp shooting in nature   Review of Systems: Negative except as noted in the HPI. Denies N/V/F/Ch.  Past Medical History:  Diagnosis Date  . Anemia   . Coronary artery disease   . Diabetes mellitus without complication ([place])   . Hypertension   . Morbid obesity ([place])   . 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  Accu-Chek FastClix Lancets MISC, USE TO CHECK BLOOD SUGAR UP TO FOUR TIMES DAILY, Disp: 102 each, Rfl: 6 .  aspirin EC 81 MG EC tablet, Take 1 tablet (81 mg total) by mouth daily., Disp: , Rfl:  .  atorvastatin (LIPITOR) 40 MG tablet, TAKE 1 TABLET(40 MG) BY MOUTH DAILY AT 6 PM, Disp: 90 tablet, Rfl: 3 .  blood glucose meter kit and supplies KIT, Dispense based on patient and insurance preference. Use up to four times daily as directed. (FOR ICD-9 250.00, 250.01)., Disp: 1 each, Rfl: 0 .  blood glucose meter kit and supplies KIT, Dispense based on patient and insurance preference. Use up to four times daily as directed. (FOR ICD-9 250.00, 250.01)., Disp: 1 each, Rfl: 0 .  ferrous sulfate 325 (65 FE) MG EC tablet, Take 325 mg by mouth in the morning, at noon, and at bedtime., Disp: , Rfl:  .   furosemide (LASIX) 20 MG tablet, Take 1 tablet (20 mg total) by mouth once a week. Take with potassium tablet, Disp: 15 tablet, Rfl: 3 .  glucose blood (ACCU-CHEK GUIDE) test strip, Use as instructed, Disp: 100 each, Rfl: 12 .  hydrOXYzine (ATARAX/VISTARIL) 10 MG tablet, Take 0.5 tablets (5 mg total) by mouth at bedtime as needed., Disp: 30 tablet, Rfl: 0 .  imiquimod (ALDARA) 5 % cream, APPLY TOPICALLY TO THE AFFECTED AREA 3 TIMES PER WEEK AT BEDTIME AND WASH OFF IN THE MORNING. USE FOR UP TO 8 WEEKS OR UNTIL INFECTION IS GONE, Disp: 12 each, Rfl: 1 .  isosorbide mononitrate (IMDUR) 30 MG 24 hr tablet, Take 1 tablet (30 mg total) by mouth daily., Disp: 90 tablet, Rfl: 3 .  lisinopril-hydrochlorothiazide (ZESTORETIC) 20-25 MG tablet, Take 1 tablet by mouth daily. Please take one tablet daily., Disp: 90 tablet, Rfl: 0 .  metFORMIN (GLUCOPHAGE) 500 MG tablet, TAKE 1 TABLET(500 MG) BY MOUTH DAILY WITH BREAKFAST, Disp: 90 tablet, Rfl: 3 .  pantoprazole (PROTONIX) 40 MG tablet, Take 1 tablet (40 mg total) by mouth daily., Disp: 30 tablet, Rfl: 2 .  potassium chloride (KLOR-CON) 10 MEQ tablet, Take 1 tablet (10 mEq total) by mouth once a week. Take same day as furosemide, Disp: 15  tablet, Rfl: 1  Social History   Tobacco Use  Smoking Status Former Smoker  . Quit date: 11/17/2017  . Years since quitting: 2.8  Smokeless Tobacco Never Used    No Known Allergies Objective:  There were no vitals filed for this visit. There is no height or weight on file to calculate BMI. Constitutional Well developed. Well nourished.  Vascular Dorsalis pedis pulses palpable bilaterally. Posterior tibial pulses palpable bilaterally. Capillary refill normal to all digits.  No cyanosis or clubbing noted. Pedal hair growth normal.  Neurologic Normal speech. Oriented to person, place, and time. Epicritic sensation to light touch grossly present bilaterally.  Dermatologic Nails well groomed and normal in  appearance. No open wounds. No skin lesions.  Orthopedic: Normal joint ROM without pain or crepitus bilaterally. No visible deformities. Tender to palpation at the calcaneal tuber bilaterally. No pain with calcaneal squeeze bilaterally. Ankle ROM diminished range of motion bilaterally. Silfverskiold Test: positive bilaterally.   Radiographs: Taken and reviewed.  None  Assessment:   1. Plantar fasciitis of right foot   2. Plantar fasciitis of left foot    Plan:  Patient was evaluated and treated and all questions answered.  Plantar Fasciitis, bilaterally - XR reviewed as above.  - Educated on icing and stretching. Instructions given.  - Injection delivered to the plantar fascia as below. - DME: None - Pharmacologic management: None  Procedure: Injection Tendon/Ligament Location: Bilateral plantar fascia at the glabrous junction; medial approach. Skin Prep: alcohol Injectate: 0.5 cc 0.5% marcaine plain, 0.5 cc of 1% Lidocaine, 0.5 cc kenalog 10. Disposition: Patient tolerated procedure well. Injection site dressed with a band-aid.  No follow-ups on file. 

## 2020-09-27 ENCOUNTER — Ambulatory Visit
Admission: RE | Admit: 2020-09-27 | Discharge: 2020-09-27 | Disposition: A | Discharge status: Home / Self Care | Payer: Medicaid Other | Source: Ambulatory Visit | Attending: Family Medicine | Admitting: Family Medicine

## 2020-09-27 ENCOUNTER — Other Ambulatory Visit: Payer: Self-pay

## 2020-09-27 DIAGNOSIS — N63 Unspecified lump in unspecified breast: Secondary | ICD-10-CM

## 2020-10-07 ENCOUNTER — Other Ambulatory Visit: Payer: Self-pay | Admitting: Family Medicine

## 2020-10-13 NOTE — Progress Notes (Deleted)
    SUBJECTIVE:   CHIEF COMPLAINT / HPI: follow up anxiety  Anxiety   PERTINENT  PMH / PSH: ***  OBJECTIVE:   There were no vitals taken for this visit.  ***  ASSESSMENT/PLAN:   No problem-specific Assessment & Plan notes found for this encounter.     Carollee Leitz, MD [place]

## 2020-10-14 ENCOUNTER — Ambulatory Visit: Payer: Medicaid Other | Admitting: Family Medicine

## 2020-10-21 ENCOUNTER — Telehealth: Payer: Self-pay

## 2020-10-21 DIAGNOSIS — E119 Type 2 diabetes mellitus without complications: Secondary | ICD-10-CM

## 2020-10-21 MED ORDER — BLOOD GLUCOSE MONITOR KIT
PACK | 0 refills | Status: DC
Start: 2020-10-21 — End: 2021-06-03

## 2020-10-21 MED ORDER — METFORMIN HCL 500 MG PO TABS: ORAL_TABLET | ORAL | 3 refills | Status: DC

## 2020-10-21 NOTE — Telephone Encounter (Signed)
Sent in script for metformin and glucose meter.

## 2020-10-21 NOTE — Telephone Encounter (Signed)
Patient calls nurse line reporting fatigue and blood pressure issues. Patient reports she was tired all day yesterday and just did not feel good over all. Patient reports compliance with her BP meds, however she has been out of Metformin. Patient states she has not checked her blood sugars in sometime due to not having a meter. Patient apologized for missing last PCP apt due to her daughter being ill. Patient states today she feels much better, but would still like to be seen. Patient scheduled for tomorrow. ED precautions given.

## 2020-10-22 ENCOUNTER — Other Ambulatory Visit: Payer: Self-pay

## 2020-10-22 ENCOUNTER — Ambulatory Visit (INDEPENDENT_AMBULATORY_CARE_PROVIDER_SITE_OTHER): Payer: Medicaid Other | Admitting: Podiatry

## 2020-10-22 ENCOUNTER — Ambulatory Visit (INDEPENDENT_AMBULATORY_CARE_PROVIDER_SITE_OTHER): Payer: Medicaid Other | Admitting: Family Medicine

## 2020-10-22 ENCOUNTER — Encounter: Payer: Self-pay | Admitting: Family Medicine

## 2020-10-22 VITALS — BP 106/70 | HR 52 | Ht 62.0 in | Wt 205.8 lb

## 2020-10-22 DIAGNOSIS — R079 Chest pain, unspecified: Secondary | ICD-10-CM | POA: Diagnosis present

## 2020-10-22 DIAGNOSIS — M722 Plantar fascial fibromatosis: Secondary | ICD-10-CM

## 2020-10-22 DIAGNOSIS — R3121 Asymptomatic microscopic hematuria: Secondary | ICD-10-CM

## 2020-10-22 DIAGNOSIS — I10 Essential (primary) hypertension: Secondary | ICD-10-CM

## 2020-10-22 DIAGNOSIS — E1159 Type 2 diabetes mellitus with other circulatory complications: Secondary | ICD-10-CM | POA: Diagnosis not present

## 2020-10-22 HISTORY — DX: Asymptomatic microscopic hematuria: R31.21

## 2020-10-22 LAB — POCT UA - MICROSCOPIC ONLY

## 2020-10-22 LAB — POCT URINALYSIS DIP (MANUAL ENTRY)
Bilirubin, UA: NEGATIVE
Glucose, UA: NEGATIVE mg/dL
Ketones, POC UA: NEGATIVE mg/dL
Leukocytes, UA: NEGATIVE
Nitrite, UA: NEGATIVE
Protein Ur, POC: NEGATIVE mg/dL
Spec Grav, UA: 1.025 (ref 1.010–1.025)
Urobilinogen, UA: 0.2 E.U./dL
pH, UA: 7 (ref 5.0–8.0)

## 2020-10-22 LAB — POCT GLYCOSYLATED HEMOGLOBIN (HGB A1C): Hemoglobin A1C: 6.3 % — AB (ref 4.0–5.6)

## 2020-10-22 MED ORDER — FERROUS SULFATE 325 (65 FE) MG PO TBEC
325.0000 mg | DELAYED_RELEASE_TABLET | Freq: Three times a day (TID) | ORAL | 1 refills | Status: DC
Start: 2020-10-22 — End: 2021-04-26

## 2020-10-22 NOTE — Patient Instructions (Signed)
Thank you for coming to see me today. It was a pleasure. Today we talked about:   You can take tylenol, use heat, or voltaren gel for your chest pain.  Continue to go to therapy.  If you think you would like medication, please let us know.  Please follow-up with PCP in 2 weeks.  If you have any questions or concerns, please do not hesitate to call the office at [phone].  Best,   [place] Constable, DO

## 2020-10-22 NOTE — Progress Notes (Signed)
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 Denies associated shortness of breath or nausea No fevers, cough, otherwise has been feeling okay, no vomiting, diarrhea  She reports that she is getting separated from her husband She is very stressed because "he wants me and my five children to be homeless" Feels safe She has been smoking again because of the stress She feels as though she is having a difficult time coping with this She has no family in the [place] She denies SI, wants to be here for her children She has a therapist that she is planning to take her children to on 12/3  She had been out of Metformin for quite some time This was refilled by her PCP in the last few days She also recently received a CBG monitor as she had been without this as well  She is requesting a blood pressure monitor to have at home, as her soon-to-be ex-husband took it with him and she would like it in case it is needed    Office Visit from 10/22/2020 in [facility]  PHQ-9 Total Score 3     iPad interpreter used throughout entirety of encounter   PERTINENT  PMH / PSH: HTN, history of left atrial myxoma, T2DM, morbid obesity, history of bradycardia  OBJECTIVE:   BP 106/70    Pulse (!) 52    Ht 5\' 2"  (1.575 m)    Wt 205 lb 12.8 oz (93.4 kg)    LMP 10/11/2020 (Exact Date)    SpO2 97%    BMI 37.64 kg/m    Physical Exam:  General: 44 y.o. female in NAD Cardio: RRR no m/r/g, TTP bilateral costochondral junction and area of 4th-5th ribs Lungs: CTAB, no wheezing, no rhonchi, no crackles, no IWOB on  RA Abdomen: Soft, non-tender to palpation, non-distended, positive bowel sounds Skin: warm and dry Extremities: No edema Psych: Intermittently tearful throughout visit, dress appropriate, no SI  Results for orders placed or performed in visit on 10/22/20 (from the past 24 hour(s))  POCT glycosylated hemoglobin (Hb A1C)     Status: Abnormal   Collection Time: 10/22/20 11:05 AM  Result Value Ref Range   Hemoglobin A1C 6.3 (A) 4.0 - 5.6 %   HbA1c POC (<> result, manual entry)     HbA1c, POC (prediabetic range)     HbA1c, POC (controlled diabetic range)    POCT urinalysis dipstick     Status: Abnormal   Collection Time: 10/22/20 11:15 AM  Result Value Ref Range   Color, UA yellow yellow   Clarity, UA clear clear   Glucose, UA negative negative mg/dL   Bilirubin, UA negative negative   Ketones, POC UA negative negative mg/dL   Spec Grav, UA 1.025 1.010 - 1.025   Blood, UA small (A) negative   pH, UA 7.0 5.0 - 8.0   Protein Ur, POC negative negative mg/dL   Urobilinogen, UA 0.2 0.2 or 1.0 E.U./dL   Nitrite, UA Negative Negative   Leukocytes, UA Negative Negative  POCT UA - Microscopic Only  Status: Abnormal   Collection Time: 10/22/20 11:15 AM  Result Value Ref Range   WBC, Ur, HPF, POC NONE    RBC, urine, microscopic 1-3    Bacteria, U Microscopic TRACE    Epithelial cells, urine per micros 5-10      ASSESSMENT/PLAN:   Chest pain Chest pain is very atypical in nature.  Given that patient is not currently having chest pain, no EKG indicated at this time.  She is very tender to palpation, therefore this is likely contributing to her pain as she has some signs of costochondritis.  She is very tearful throughout the examination and discussing her current separation from her husband and how stressful this has been on her and her family.  Discussed with her treatment options including regular medication therapy as well as as needed medication.  She declines that at this time.  She  already has a therapist that she has been following with and has a plan to take her children soon for a joint therapy session.  She denies SI.  It is most likely that her stressful situation is likely contributing to her overall fatigue and chest pain.  Her blood pressure is well controlled today and her A1c is within range.  We will have her follow-up in 2 weeks with her PCP to ensure that she continues to do well from this standpoint.  Also advised her that if her pain worsens at any point, changes, is associated with shortness of breath or nausea, she should be seen right away.  Can use Tylenol or Voltaren gel as needed for pain, could also try heat.  DM2 (diabetes mellitus, type 2) (HCC) Her A1c is well controlled.  Metformin had been refilled by her PCP in the last few days.  Continue with current regimen.  Follow-up with PCP in 2 weeks.  HTN (hypertension) BP is well controlled today.  Continue current regimen of lisinopril-hydrochlorothiazide, Imdur, Lasix weekly.  Given a written prescription for a blood pressure cuff.  Asymptomatic microscopic hematuria <3, no particular cause, likely false positive dipstick.  F/U PCP.     Cleophas Dunker, [place]

## 2020-10-22 NOTE — Assessment & Plan Note (Signed)
BP is well controlled today.  Continue current regimen of lisinopril-hydrochlorothiazide, Imdur, Lasix weekly.  Given a written prescription for a blood pressure cuff.

## 2020-10-22 NOTE — Assessment & Plan Note (Signed)
<  3, no particular cause, likely false positive dipstick.  F/U PCP.

## 2020-10-22 NOTE — Assessment & Plan Note (Signed)
Her A1c is well controlled.  Metformin had been refilled by her PCP in the last few days.  Continue with current regimen.  Follow-up with PCP in 2 weeks.

## 2020-10-22 NOTE — Assessment & Plan Note (Signed)
Chest pain is very atypical in nature.  Given that patient is not currently having chest pain, no EKG indicated at this time.  She is very tender to palpation, therefore this is likely contributing to her pain as she has some signs of costochondritis.  She is very tearful throughout the examination and discussing her current separation from her husband and how stressful this has been on her and her family.  Discussed with her treatment options including regular medication therapy as well as as needed medication.  She declines that at this time.  She already has a therapist that she has been following with and has a plan to take her children soon for a joint therapy session.  She denies SI.  It is most likely that her stressful situation is likely contributing to her overall fatigue and chest pain.  Her blood pressure is well controlled today and her A1c is within range.  We will have her follow-up in 2 weeks with her PCP to ensure that she continues to do well from this standpoint.  Also advised her that if her pain worsens at any point, changes, is associated with shortness of breath or nausea, she should be seen right away.  Can use Tylenol or Voltaren gel as needed for pain, could also try heat.

## 2020-10-26 ENCOUNTER — Encounter: Payer: Self-pay | Admitting: Podiatry

## 2020-10-26 NOTE — Progress Notes (Signed)
Subjective:  Patient ID: Debra Diaz, female    DOB: 09-20-1976,  MRN: [id number]  No chief complaint on file.   44 y.o. female presents with the above complaint.  Patient presents today with a translator for Spanish in regards to follow-up of bilateral plantar fasciitis.  She states that she is doing 100% better.  She states the injection helped a lot her she does not have any further heel pain.  She denies any other acute complaints.   Review of Systems: Negative except as noted in the HPI. Denies N/V/F/Ch.  Past Medical History:  Diagnosis Date  . Anemia   . Coronary artery disease   . Diabetes mellitus without complication ([place])   . Hypertension   . Morbid obesity ([place])   . 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  Accu-Chek FastClix Lancets MISC, USE TO CHECK BLOOD SUGAR UP TO FOUR TIMES DAILY, Disp: 102 each, Rfl: 6 .  aspirin EC 81 MG EC tablet, Take 1 tablet (81 mg total) by mouth daily., Disp: , Rfl:  .  atorvastatin (LIPITOR) 40 MG tablet, TAKE 1 TABLET(40 MG) BY MOUTH DAILY AT 6 PM, Disp: 90 tablet, Rfl: 3 .  blood glucose meter kit and supplies KIT, Dispense based on patient and insurance preference. Use up to four times daily as directed. (FOR ICD-9 250.00, 250.01)., Disp: 1 each, Rfl: 0 .  blood glucose meter kit and supplies KIT, Dispense based on patient and insurance preference. Use up to four times daily as directed. (FOR ICD-9 250.00, 250.01)., Disp: 1 each, Rfl: 0 .  ferrous sulfate 325 (65 FE) MG EC tablet, Take 1 tablet (325 mg total) by mouth in the morning, at noon, and at bedtime., Disp: 90 tablet, Rfl: 1 .  furosemide (LASIX) 20 MG tablet, Take 1 tablet (20 mg total) by mouth once a week. Take with potassium tablet, Disp: 15 tablet, Rfl: 3 .  glucose blood (ACCU-CHEK GUIDE) test strip, Use as instructed, Disp: 100 each, Rfl: 12 .  hydrOXYzine (ATARAX/VISTARIL) 10 MG tablet, Take 0.5  tablets (5 mg total) by mouth at bedtime as needed., Disp: 30 tablet, Rfl: 0 .  imiquimod (ALDARA) 5 % cream, APPLY TOPICALLY TO THE AFFECTED AREA 3 TIMES PER WEEK AT BEDTIME AND WASH OFF IN THE MORNING. USE FOR UP TO 8 WEEKS OR UNTIL INFECTION IS GONE, Disp: 12 each, Rfl: 1 .  isosorbide mononitrate (IMDUR) 30 MG 24 hr tablet, Take 1 tablet (30 mg total) by mouth daily., Disp: 90 tablet, Rfl: 3 .  lisinopril-hydrochlorothiazide (ZESTORETIC) 20-25 MG tablet, TAKE 1 TABLET BY MOUTH DAILY, Disp: 90 tablet, Rfl: 0 .  metFORMIN (GLUCOPHAGE) 500 MG tablet, TAKE 1 TABLET(500 MG) BY MOUTH DAILY WITH BREAKFAST, Disp: 90 tablet, Rfl: 3 .  pantoprazole (PROTONIX) 40 MG tablet, Take 1 tablet (40 mg total) by mouth daily., Disp: 30 tablet, Rfl: 2 .  potassium chloride (KLOR-CON) 10 MEQ tablet, Take 1 tablet (10 mEq total) by mouth once a week. Take same day as furosemide, Disp: 15 tablet, Rfl: 1  Social History   Tobacco Use  Smoking Status Former Smoker  . Quit date: 11/17/2017  . Years since quitting: 2.9  Smokeless Tobacco Never Used    No Known Allergies Objective:  There were no vitals filed for this visit. There is no height or weight on file to calculate BMI. Constitutional Well developed. Well nourished.  Vascular Dorsalis pedis pulses palpable bilaterally. Posterior  tibial pulses palpable bilaterally. Capillary refill normal to all digits.  No cyanosis or clubbing noted. Pedal hair growth normal.  Neurologic Normal speech. Oriented to person, place, and time. Epicritic sensation to light touch grossly present bilaterally.  Dermatologic Nails well groomed and normal in appearance. No open wounds. No skin lesions.  Orthopedic: Normal joint ROM without pain or crepitus bilaterally. No visible deformities. No Tender to palpation at the calcaneal tuber bilaterally. No pain with calcaneal squeeze bilaterally. Ankle ROM diminished range of motion bilaterally. Silfverskiold Test: positive  bilaterally.   Radiographs: Taken and reviewed.  None  Assessment:   1. Plantar fasciitis of right foot   2. Plantar fasciitis of left foot    Plan:  Patient was evaluated and treated and all questions answered.  Plantar Fasciitis, bilaterally - Clinically resolved with a steroid injection.  I discussed with her the importance of shoe gear modification as well as continue stretching exercises.  Patient states understanding and she will continue to do so.  Have asked her if any foot and ankle issues arise in the future to come back and see me. No follow-ups on file.

## 2020-11-18 ENCOUNTER — Other Ambulatory Visit: Payer: Self-pay

## 2020-11-18 ENCOUNTER — Ambulatory Visit (INDEPENDENT_AMBULATORY_CARE_PROVIDER_SITE_OTHER): Payer: Medicaid Other | Admitting: Family Medicine

## 2020-11-18 ENCOUNTER — Encounter: Payer: Self-pay | Admitting: Family Medicine

## 2020-11-18 VITALS — BP 118/72 | HR 49 | Ht 62.0 in | Wt 206.6 lb

## 2020-11-18 DIAGNOSIS — F411 Generalized anxiety disorder: Secondary | ICD-10-CM | POA: Diagnosis present

## 2020-11-18 DIAGNOSIS — R001 Bradycardia, unspecified: Secondary | ICD-10-CM

## 2020-11-18 DIAGNOSIS — I10 Essential (primary) hypertension: Secondary | ICD-10-CM

## 2020-11-18 MED ORDER — HYDROXYZINE HCL 10 MG PO TABS
5.0000 mg | ORAL_TABLET | Freq: Every evening | ORAL | 0 refills | Status: DC | PRN
Start: 2020-11-18 — End: 2021-01-04

## 2020-11-18 MED ORDER — BLOOD PRESSURE KIT: PACK | 0 refills | Status: AC

## 2020-11-18 MED ORDER — BLOOD PRESSURE KIT: PACK | 0 refills | Status: DC

## 2020-11-18 NOTE — Progress Notes (Signed)
    SUBJECTIVE:   CHIEF COMPLAINT / HPI: follow up chest pain  Was seen in clinic 11/05 for atypical chest pain.  Since that time she has not had any recurrence of chest pain.  She reports that she has been feeling much better since her husband has not been around as much.  Denies any chest pain, SOB, nausea/vomiting.  She reports that she did take Atarax that helped.     HTN Well controlled.  She is bradycardic with HR 49 and asymptomatic.  Last seen by cardiology 04/21.  She currently takes Potassium once a week, Zestoretic and Imdur daily. She has not been taking Lasix.  She denies any chest pain, headaches or visual disturbances.   PERTINENT  PMH / PSH:  Anxiety Bradycardia Atypical Chest Pain Atrial Myxoma excision   OBJECTIVE:   BP 118/72   Pulse (!) 49   Ht 5\' 2"  (1.575 m)   Wt 206 lb 9.6 oz (93.7 kg)   LMP 11/14/2020   SpO2 98%   BMI 37.79 kg/m   General: Alert and oriented, no apparent distress  Cardiovascular: RRR with no murmurs noted Respiratory: CTA bilaterally  Psych: Behavior and speech appropriate to situation  ASSESSMENT/PLAN:   Anxiety reaction GAD and PHQ9  reviewed today.  Reports feeling so much better since husband has left. No SI/HI -Continue Atarax as needed -Continue therapy as needed -Follow up 3/12  Bradycardia HR 49.  Last seen Cards in April and was recommended to follow up in 6 weeks.Current medications include Zestoretic, Imdur and potassium  No AVN blockers that would contribute to low HR. She is asymptomatic. -Check Bmet for electrolyte imbalance -Follow up with cards, appointment made for 1/18.      Carollee Leitz, MD [place]

## 2020-11-18 NOTE — Patient Instructions (Addendum)
It was nice meeting you today!  When you start having symptoms of being overwhelmed please taking Atarax.  If your symptoms worsen then he began to have worsening chest pain please come to the clinic or go to urgent care as soon as possible.  The clinic will call you you know when and where to pick up blood pressure kit.  Please with how you are doing.  Continue to keep up the good work.  I will get some blood work today.  I have refilled your Atarax.  Enjoy the holidays.  If you have any questions or concerns, please feel free to call the clinic.   Follow-up with me in 3 months.  Be well,  Carollee Leitz, MD [facility]

## 2020-11-19 ENCOUNTER — Encounter: Payer: Self-pay | Admitting: Family Medicine

## 2020-11-19 LAB — BASIC METABOLIC PANEL
BUN/Creatinine Ratio: 23 (ref 9–23)
BUN: 15 mg/dL (ref 6–24)
CO2: 25 mmol/L (ref 20–29)
Calcium: 9.4 mg/dL (ref 8.7–10.2)
Chloride: 98 mmol/L (ref 96–106)
Creatinine, Ser: 0.64 mg/dL (ref 0.57–1.00)
GFR calc Af Amer: 125 mL/min/{1.73_m2} (ref >59)
GFR calc non Af Amer: 108 mL/min/{1.73_m2} (ref >59)
Glucose: 104 mg/dL — ABNORMAL HIGH (ref 65–99)
Potassium: 4.2 mmol/L (ref 3.5–5.2)
Sodium: 138 mmol/L (ref 134–144)

## 2020-11-21 ENCOUNTER — Encounter: Payer: Self-pay | Admitting: Family Medicine

## 2020-11-21 NOTE — Assessment & Plan Note (Signed)
GAD and PHQ9  reviewed today.  Reports feeling so much better since husband has left. No SI/HI -Continue Atarax as needed -Continue therapy as needed -Follow up 3/12

## 2020-11-21 NOTE — Assessment & Plan Note (Signed)
HR 49.  Last seen Cards in April and was recommended to follow up in 6 weeks.Current medications include Zestoretic, Imdur and potassium  No AVN blockers that would contribute to low HR. She is asymptomatic. -Check Bmet for electrolyte imbalance -Follow up with cards, appointment made for 1/18.

## 2020-12-29 NOTE — Progress Notes (Signed)
Virtual Visit via Video Note   This visit type was conducted due to national recommendations for restrictions regarding the COVID-19 Pandemic (e.g. social distancing) in an effort to limit this patient's exposure and mitigate transmission in our community.  Due to her co-morbid illnesses, this patient is at least at moderate risk for complications without adequate follow up.  This format is felt to be most appropriate for this patient at this time.  All issues noted in this document were discussed and addressed.  A limited physical exam was performed with this format.  Please refer to the patient's chart for her consent to telehealth for [facility].       Date:  01/04/2021   ID:  Debra Diaz, DOB [birth year], MRN [id number] The patient was identified using 2 identifiers.  Patient Location: Home Provider Location: Home Office  PCP:  Carollee Leitz, MD  Cardiologist:  Dorris Carnes, MD   Electrophysiologist:  None   Evaluation Performed:  Follow-Up Visit  Chief Complaint:  Follow up  History of Present Illness:    Debra Diaz is a 45 y.o. female with a history of atrial myxoma, DM, HTN and  chest pain  She had a cardiac catheterization in 2020 which was normal     She was admitted to [facility] 02/2020  for CP  EKG negative  Trop were 36, 48,34. Amlodipine added.  Patient last saw Dr. Harrington Challenger 03/22/2020 at which time she stopped her amlodipine because of dizziness.  She occasionally wakes up with chest pain and shortness of breath no associated with activity.   Because LVEDP was elevated at cath Dr. Harrington Challenger recommended a trial of Imdur 30 mg daily Lasix 20 with potassium 10 meq.  Visit via video with [place] interpreter. 3 days ago was short of breath and BP was slightly elevated. Tried to deep breath because she thought it might be a panic attack. She was under a lot of stress because her son had covid and surgery yesterday. She's upset because of snow/ice and she  can't get to pharmacy for her son. Denies any chest pain, exertional dyspnea, palpitations dizziness or presyncope. 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 She isn't taking lasix potassium or imdur anymore but says she'll start back. She just ran out. Sometimes diastolic runs 07-86. Feet are swollen the 2-3 days since she ran out of meds. She tested negative for covid 5 days ago. Had J&J last Feb.   The patient does not have symptoms concerning for COVID-19 infection (fever, chills, cough, or new shortness of breath).    Past Medical History:  Diagnosis Date  . Anemia   . Coronary artery disease   . Diabetes mellitus without complication ([place])   . Hypertension   . Morbid obesity ([place])   . 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 [place] and 2011   x 2  . LEFT HEART CATHETERIZATION WITH CORONARY ANGIOGRAM N/A 03/02/2015   Procedure: LEFT HEART CATHETERIZATION WITH CORONARY ANGIOGRAM;  Surgeon: [name], MD;  Location[facility] CATH LAB;  Service: Cardiovascular;  Laterality: N/A;  . MINIMALLY INVASIVE EXCISION OF ATRIAL MYXOMA N/A 03/04/2015   Procedure: MINIMALLY INVASIVE RESECTION OF LEFT ATRIAL MYXOMA ( USING A BILAYER PATCH CLOSURE);  Surgeon: [name], MD;  Location: [place];  Service: Open Heart Surgery;  Laterality: N/A;  . myxoma N/A  chest  . TEE WITHOUT CARDIOVERSION N/A 03/01/2015   Procedure: TRANSESOPHAGEAL ECHOCARDIOGRAM (TEE);  Surgeon: [name], MD;  Location: [facility] ENDOSCOPY;  Service: Cardiovascular;  Laterality: N/A;  . TEE WITHOUT CARDIOVERSION N/A 03/04/2015   Procedure: TRANSESOPHAGEAL ECHOCARDIOGRAM (TEE);  Surgeon: [name], MD;  Location: [place];  Service: Open Heart Surgery;  Laterality: N/A;  . TUBAL LIGATION  2011     Current Meds  Medication Sig  . Accu-Chek FastClix Lancets MISC USE TO CHECK BLOOD SUGAR UP TO FOUR TIMES DAILY  .  aspirin EC 81 MG EC tablet Take 1 tablet (81 mg total) by mouth daily.  Marland Kitchen atorvastatin (LIPITOR) 40 MG tablet TAKE 1 TABLET(40 MG) BY MOUTH DAILY AT 6 PM  . blood glucose meter kit and supplies KIT Dispense based on patient and insurance preference. Use up to four times daily as directed. (FOR ICD-9 250.00, 250.01).  . blood glucose meter kit and supplies KIT Dispense based on patient and insurance preference. Use up to four times daily as directed. (FOR ICD-9 250.00, 250.01).  . Blood Pressure KIT Check blood pressure twice a day.  Dx code: I79  . ferrous sulfate 325 (65 FE) MG EC tablet Take 1 tablet (325 mg total) by mouth in the morning, at noon, and at bedtime.  . furosemide (LASIX) 20 MG tablet Take 1 tablet (20 mg total) by mouth once a week. Take with potassium tablet  . glucose blood (ACCU-CHEK GUIDE) test strip Use as instructed  . lisinopril-hydrochlorothiazide (ZESTORETIC) 20-25 MG tablet TAKE 1 TABLET BY MOUTH DAILY  . metFORMIN (GLUCOPHAGE) 500 MG tablet TAKE 1 TABLET(500 MG) BY MOUTH DAILY WITH BREAKFAST  . potassium chloride (KLOR-CON) 10 MEQ tablet Take 1 tablet (10 mEq total) by mouth once a week. Take same day as furosemide     Allergies:   Patient has no known allergies.   Social History   Tobacco Use  . Smoking status: Former Smoker    Quit date: 11/17/2017    Years since quitting: 3.1  . Smokeless tobacco: Never Used  Vaping Use  . Vaping Use: Never used  Substance Use Topics  . Alcohol use: Yes    Comment: occasional  . Drug use: No     Family Hx: The patient's family history includes Asthma in her son; Breast cancer in her maternal grandmother; Diabetes in her father; Hypertension in her mother.  ROS:   Please see the history of present illness.      All other systems reviewed and are negative.   Prior CV studies:   The following studies were reviewed today:  Nuclear stress test 09/13/16  Nuclear stress EF: 58%.  There was no ST segment deviation  noted during stress.  The study is normal.  This is a low risk study.  The left ventricular ejection fraction is normal (55-65%).   Normal pharmacologic nuclear stress test with no evidence of prior infarct or ischemia.    Echo 01/09/2017 - Left ventricle: The cavity size was normal. Wall thickness was    normal. Systolic function was normal. The estimated ejection    fraction was in the range of 55% to 60%. Wall motion was normal;    there were no regional wall motion abnormalities. Left    ventricular diastolic function parameters were normal.  - Left atrium: The atrium was normal in size.  - Tricuspid valve: There was trivial regurgitation.  - Pulmonary arteries: PA peak pressure: 28 mm Hg (S).  - Inferior  vena cava: The vessel was normal in size. The    respirophasic diameter changes were in the normal range (>= 50%),    consistent with normal central venous pressure.    LHC 02/2015  Dominance: Right  Left Main: Very Large caliber vessel that trifurcates into the LAD, Ramus Intermedius, and Left Circumflex. Angiographically normal   LAD: Large-caliber vessel with a very proximal large caliber High First Diagonal Branch. The LAD has mild diffuse tender 20% lesions but is relatively atrophic normal as it courses down around the apex perfusing the distal third of the inferoapex.    D1: Large caliber high branch with several distal branches. Mild possible luminal irregularities of less than 30%.    D2: Moderate caliber vessel that arises from the mid LAD. It does not cover large distribution but is angiographically normal.   Left Circumflex: Large-caliber, nondominant vessel that courses mostly as a large lateral bifurcating OM branch. The inferior branch is much larger than the superior branch. The distal vessel is tortuous and reaches down almost of the inferoapex. Mild luminal irregularity. There is a very small AV groove branch..    Ramus intermedius: Large caliber vessel that  courses is a high OM. It gives off a moderate sized branch from the mid vessel just after a eccentric tubular 30% lesion.. The daughter vessel and parent both reaches almost to the apex. They are somewhat tortuous, but relatively free of disease.     RCA: Large-caliber codominant vessel that gives rise to a significant RV marginal branch in the mid vessel. It bifurcates distally into the Right Posterior Descending Artery  (RPDA) and the Right Posterior AV Groove Branch (RPAV).  Angiographically normal.    RPDA: Large caliber vessel that reaches two thirds the way to the apex. Angiographically normal.    RPL Sysytem:The RPAV begins as a moderate large vessel that terminates as a moderate caliber posterolateral branch. Angiographically normal.  MYVUE   NOVANT AUg 2020   CATH Sept 2020 [facility]OV[facility]   Findings:  1. Hemodynamics: Aortic pressure 125/75, LVEDP 12 2. Coronary system:  Left Main: Large caliber vessel with no angiographic evidence of  stenosis.  LAD system: Large caliber vessel, wraps around the apex. NO significant  stenosis  LCX system: Large caliber vessel.  No significant stenosis.  RCA system: right dominant. No significant stenosis.  Conclusion: Normal coronaries CAD as mentioned above. Normal LVEDP   Labs/Other Tests and Data Reviewed:    EKG:  No ECG reviewed.  Recent Labs: 02/15/2020: TSH 2.818 07/14/2020: Hemoglobin 14.0; Platelets 282 11/18/2020: BUN 15; Creatinine, Ser 0.64; Potassium 4.2; Sodium 138   Recent Lipid Panel Lab Results  Component Value Date/Time   CHOL 165 02/11/2020 03:48 PM   TRIG 98 02/11/2020 03:48 PM   HDL 47 02/11/2020 03:48 PM   CHOLHDL 3.5 02/11/2020 03:48 PM   CHOLHDL 4.0 09/01/2016 04:07 PM   LDLCALC 100 (H) 02/11/2020 03:48 PM    Wt Readings from Last 3 Encounters:  01/04/21 205 lb (93 kg)  11/18/20 206 lb 9.6 oz (93.7 kg)  10/22/20 205 lb 12.8 oz (93.4 kg)     Risk Assessment/Calculations:      Objective:    Vital Signs:   BP 134/76   Pulse (!) 42   Ht 5' 2"  (1.575 m)   Wt 205 lb (93 kg)   BMI 37.49 kg/m    VITAL SIGNS:  reviewed GEN:  no acute distress RESPIRATORY:  normal respiratory effort, symmetric expansion CARDIOVASCULAR:  lower extremity  edema noted  ASSESSMENT & PLAN:    Chest pain normal cardiac cath in 2020 LVEDP elevated so Dr. Harrington Challenger started Imdur and Lasix last office visit. No further chest pain but has been out of meds for 3 weeks.  History of bradycardia not on AV nodal blocking agents and has been asymptomatic  Hypertension BP running high at times. Ran out of lasix, potassium and Imdur. She says she'll fill them. Some leg edema. Crt 0.64 11/18/20.   Covid19 last vaccine J&J Feb 2021. Encouraged her to get a booster.      COVID-19 Education: The signs and symptoms of COVID-19 were discussed with the patient and how to seek care for testing (follow up with PCP or arrange E-visit).   The importance of social distancing was discussed today.  Time:   Today, I have spent 17:50 minutes with the patient with telehealth technology discussing the above problems.     Medication Adjustments/Labs and Tests Ordered: Current medicines are reviewed at length with the patient today.  Concerns regarding medicines are outlined above.   Tests Ordered: No orders of the defined types were placed in this encounter.   Medication Changes: No orders of the defined types were placed in this encounter.   Follow Up:  In Person in 3 month(s) Dr. Harrington Challenger.  Signed, Ermalinda Barrios, PA-C  01/04/2021 11:02 AM    [facility]

## 2020-12-30 ENCOUNTER — Other Ambulatory Visit: Payer: Medicaid Other

## 2020-12-30 DIAGNOSIS — Z20822 Contact with and (suspected) exposure to covid-19: Secondary | ICD-10-CM

## 2021-01-01 LAB — NOVEL CORONAVIRUS, NAA: SARS-CoV-2, NAA: NOT DETECTED

## 2021-01-01 LAB — SARS-COV-2, NAA 2 DAY TAT

## 2021-01-04 ENCOUNTER — Telehealth: Payer: Self-pay | Admitting: *Deleted

## 2021-01-04 ENCOUNTER — Encounter: Payer: Self-pay | Admitting: Physician Assistant

## 2021-01-04 ENCOUNTER — Other Ambulatory Visit: Payer: Self-pay

## 2021-01-04 ENCOUNTER — Telehealth (INDEPENDENT_AMBULATORY_CARE_PROVIDER_SITE_OTHER): Payer: Medicaid Other | Admitting: Physician Assistant

## 2021-01-04 VITALS — BP 134/76 | HR 42 | Ht 62.0 in | Wt 205.0 lb

## 2021-01-04 DIAGNOSIS — I10 Essential (primary) hypertension: Secondary | ICD-10-CM

## 2021-01-04 DIAGNOSIS — Z7189 Other specified counseling: Secondary | ICD-10-CM

## 2021-01-04 DIAGNOSIS — R079 Chest pain, unspecified: Secondary | ICD-10-CM | POA: Diagnosis not present

## 2021-01-04 DIAGNOSIS — R001 Bradycardia, unspecified: Secondary | ICD-10-CM | POA: Diagnosis not present

## 2021-01-04 MED ORDER — ISOSORBIDE MONONITRATE ER 30 MG PO TB24: 30.0000 mg | ORAL_TABLET | Freq: Every day | ORAL | 3 refills | Status: DC

## 2021-01-04 MED ORDER — POTASSIUM CHLORIDE ER 10 MEQ PO TBCR: 10.0000 meq | EXTENDED_RELEASE_TABLET | ORAL | 3 refills | Status: DC

## 2021-01-04 MED ORDER — FUROSEMIDE 20 MG PO TABS: 20.0000 mg | ORAL_TABLET | ORAL | 3 refills | Status: DC

## 2021-01-04 NOTE — Telephone Encounter (Signed)
  Patient Consent for Virtual Visit         Debra Diaz has provided verbal consent on 01/04/2021 for a virtual visit (video or telephone).   CONSENT FOR VIRTUAL VISIT FOR:  Debra Diaz  By participating in this virtual visit I agree to the following:  I hereby voluntarily request, consent and authorize [facility] and its employed or contracted physicians, physician assistants, nurse practitioners or other licensed health care professionals (the Practitioner), to provide me with telemedicine health care services (the "Services") as deemed necessary by the treating Practitioner. I acknowledge and consent to receive the Services by the Practitioner via telemedicine. I understand that the telemedicine visit will involve communicating with the Practitioner through live audiovisual communication technology and the disclosure of certain medical information by electronic transmission. I acknowledge that I have been given the opportunity to request an in-person assessment or other available alternative prior to the telemedicine visit and am voluntarily participating in the telemedicine visit.  I understand that I have the right to withhold or withdraw my consent to the use of telemedicine in the course of my care at any time, without affecting my right to future care or treatment, and that the Practitioner or I may terminate the telemedicine visit at any time. I understand that I have the right to inspect all information obtained and/or recorded in the course of the telemedicine visit and may receive copies of available information for a reasonable fee.  I understand that some of the potential risks of receiving the Services via telemedicine include:  Marland Kitchen Delay or interruption in medical evaluation due to technological equipment failure or disruption; . Information transmitted may not be sufficient (e.g. poor resolution of images) to allow for appropriate medical decision  making by the Practitioner; and/or  . In rare instances, security protocols could fail, causing a breach of personal health information.  Furthermore, I acknowledge that it is my responsibility to provide information about my medical history, conditions and care that is complete and accurate to the best of my ability. I acknowledge that Practitioner's advice, recommendations, and/or decision may be based on factors not within their control, such as incomplete or inaccurate data provided by me or distortions of diagnostic images or specimens that may result from electronic transmissions. I understand that the practice of medicine is not an exact science and that Practitioner makes no warranties or guarantees regarding treatment outcomes. I acknowledge that a copy of this consent can be made available to me via my patient portal ([facility]), or I can request a printed copy by calling the office of [facility].    I understand that my insurance will be billed for this visit.   I have read or had this consent read to me. . I understand the contents of this consent, which adequately explains the benefits and risks of the Services being provided via telemedicine.  . I have been provided ample opportunity to ask questions regarding this consent and the Services and have had my questions answered to my satisfaction. . I give my informed consent for the services to be provided through the use of telemedicine in my medical care

## 2021-01-04 NOTE — Addendum Note (Signed)
Addended by: Carylon Perches on: 01/04/2021 11:17 AM   Modules accepted: Orders

## 2021-01-04 NOTE — Patient Instructions (Addendum)
Medication Instructions:  Your physician recommends that you continue on your current medications as directed. Please refer to the Current Medication list given to you today.  I have sent in refills on your medications to [facility].  *If you need a refill on your cardiac medications before your next appointment, please call your pharmacy*   Lab Work: BMET at next appointment If you have labs (blood work) drawn today and your tests are completely normal, you will receive your results only by: Marland Kitchen MyChart Message (if you have MyChart) OR . A paper copy in the mail If you have any lab test that is abnormal or we need to change your treatment, we will call you to review the results.   Follow-Up: At [facility], you and your health needs are our priority.  As part of our continuing mission to provide you with exceptional heart care, we have created designated Provider Care Teams.  These Care Teams include your primary Cardiologist (physician) and Advanced Practice Providers (APPs -  Physician Assistants and Nurse Practitioners) who all work together to provide you with the care you need, when you need it.  Your next appointment:   3 month(s) -- Debra Diaz 2022  The format for your next appointment:   In Person  Provider:   You may see Dorris Carnes, MD or one of the following Advanced Practice Providers on your designated Care Team:    Richardson Dopp, PA-C  [place]in [place], [place]    Other Instructions Two Gram Sodium Diet 2000 mg  What is Sodium? Sodium is a mineral found naturally in many foods. The most significant source of sodium in the diet is table salt, which is about 40% sodium.  Processed, convenience, and preserved foods also contain a large amount of sodium.  The body needs only 500 mg of sodium daily to function,  A normal diet provides more than enough sodium even if you do not use salt.  Why Limit Sodium? A build up of sodium in the body can cause thirst, increased blood pressure,  shortness of breath, and water retention.  Decreasing sodium in the diet can reduce edema and risk of heart attack or stroke associated with high blood pressure.  Keep in mind that there are many other factors involved in these health problems.  Heredity, obesity, lack of exercise, cigarette smoking, stress and what you eat all play a role.  General Guidelines:  Do not add salt at the table or in cooking.  One teaspoon of salt contains over 2 grams of sodium.  Read food labels  Avoid processed and convenience foods  Ask your dietitian before eating any foods not dicussed in the menu planning guidelines  Consult your physician if you wish to use a salt substitute or a sodium containing medication such as antacids.  Limit milk and milk products to 16 oz (2 cups) per day.  Shopping Hints:  READ LABELS!! "Dietetic" does not necessarily mean low sodium.  Salt and other sodium ingredients are often added to foods during processing.   Menu Planning Guidelines Food Group Choose More Often Avoid  Beverages (see also the milk group All fruit juices, low-sodium, salt-free vegetables juices, low-sodium carbonated beverages Regular vegetable or tomato juices, commercially softened water used for drinking or cooking  Breads and Cereals Enriched white, wheat, rye and pumpernickel bread, hard rolls and dinner rolls; muffins, cornbread and waffles; most dry cereals, cooked cereal without added salt; unsalted crackers and breadsticks; low sodium or homemade bread crumbs Bread, rolls  and crackers with salted tops; quick breads; instant hot cereals; pancakes; commercial bread stuffing; self-rising flower and biscuit mixes; regular bread crumbs or cracker crumbs  Desserts and Sweets Desserts and sweets mad with mild should be within allowance Instant pudding mixes and cake mixes  Fats Butter or margarine; vegetable oils; unsalted salad dressings, regular salad dressings limited to 1 Tbs; light, sour and heavy  cream Regular salad dressings containing bacon fat, bacon bits, and salt pork; snack dips made with instant soup mixes or processed cheese; salted nuts  Fruits Most fresh, frozen and canned fruits Fruits processed with salt or sodium-containing ingredient (some dried fruits are processed with sodium sulfites        Vegetables Fresh, frozen vegetables and low- sodium canned vegetables Regular canned vegetables, sauerkraut, pickled vegetables, and others prepared in brine; frozen vegetables in sauces; vegetables seasoned with ham, bacon or salt pork  Condiments, Sauces, Miscellaneous  Salt substitute with physician's approval; pepper, herbs, spices; vinegar, lemon or lime juice; hot pepper sauce; garlic powder, onion powder, low sodium soy sauce (1 Tbs.); low sodium condiments (ketchup, chili sauce, mustard) in limited amounts (1 tsp.) fresh ground horseradish; unsalted tortilla chips, pretzels, potato chips, popcorn, salsa (1/4 cup) Any seasoning made with salt including garlic salt, celery salt, onion salt, and seasoned salt; sea salt, rock salt, kosher salt; meat tenderizers; monosodium glutamate; mustard, regular soy sauce, barbecue, sauce, chili sauce, teriyaki sauce, steak sauce, Worcestershire sauce, and most flavored vinegars; canned gravy and mixes; regular condiments; salted snack foods, olives, picles, relish, horseradish sauce, catsup   Food preparation: Try these seasonings Meats:    Pork Sage, onion Serve with applesauce  Chicken Poultry seasoning, thyme, parsley Serve with cranberry sauce  Lamb Curry powder, rosemary, garlic, thyme Serve with mint sauce or jelly  Veal Marjoram, basil Serve with current jelly, cranberry sauce  Beef Pepper, bay leaf Serve with dry mustard, unsalted chive butter  Fish [place] leaf, dill Serve with unsalted lemon butter, unsalted parsley butter  Vegetables:    Asparagus Lemon juice   Broccoli Lemon juice   Carrots Mustard dressing parsley, mint, nutmeg,  glazed with unsalted butter and sugar   Green beans Marjoram, lemon juice, nutmeg,dill seed   Tomatoes Basil, marjoram, onion   Spice /blend for [employer]" 4 tsp ground thyme 1 tsp ground sage 3 tsp ground rosemary 4 tsp ground marjoram   Test your knowledge 1. A product that says "Salt Free" may still contain sodium. True or False 2. Garlic Powder and Hot Pepper Sauce an be used as alternative seasonings.True or False 3. Processed foods have more sodium than fresh foods.  True or False 4. Canned Vegetables have less sodium than froze True or False  WAYS TO DECREASE YOUR SODIUM INTAKE 1. Avoid the use of added salt in cooking and at the table.  Table salt (and other prepared seasonings which contain salt) is probably one of the greatest sources of sodium in the diet.  Unsalted foods can gain flavor from the sweet, sour, and butter taste sensations of herbs and spices.  Instead of using salt for seasoning, try the following seasonings with the foods listed.  Remember: how you use them to enhance natural food flavors is limited only by your creativity... Allspice-Meat, fish, eggs, fruit, peas, red and yellow vegetables Almond Extract-Fruit baked goods Anise Seed-Sweet breads, fruit, carrots, beets, cottage cheese, cookies (tastes like licorice) Basil-Meat, fish, eggs, vegetables, rice, vegetables salads, soups, sauces Bay Leaf-Meat, fish, stews, poultry Burnet-Salad, vegetables (cucumber-like  flavor) Caraway Seed-Bread, cookies, cottage cheese, meat, vegetables, cheese, rice Cardamon-Baked goods, fruit, soups Celery Powder or seed-Salads, salad dressings, sauces, meatloaf, soup, bread.Do not use  celery salt Chervil-Meats, salads, fish, eggs, vegetables, cottage cheese (parsley-like flavor) Chili Power-Meatloaf, chicken cheese, corn, eggplant, egg dishes Chives-Salads cottage cheese, egg dishes, soups, vegetables, sauces Cilantro-Salsa, casseroles Cinnamon-Baked goods, fruit, pork, lamb,  chicken, carrots Cloves-Fruit, baked goods, fish, pot roast, green beans, beets, carrots Coriander-Pastry, cookies, meat, salads, cheese (lemon-orange flavor) Cumin-Meatloaf, fish,cheese, eggs, cabbage,fruit pie (caraway flavor) [employer], fruit, eggs, fish, poultry, cottage cheese, vegetables Dill Seed-Meat, cottage cheese, poultry, vegetables, fish, salads, bread Fennel Seed-Bread, cookies, apples, pork, eggs, fish, beets, cabbage, cheese, Licorice-like flavor Garlic-(buds or powder) Salads, meat, poultry, fish, bread, butter, vegetables, potatoes.Do not  use garlic salt Ginger-Fruit, vegetables, baked goods, meat, fish, poultry Horseradish Root-Meet, vegetables, butter Lemon Juice or Extract-Vegetables, fruit, tea, baked goods, fish salads Mace-Baked goods fruit, vegetables, fish, poultry (taste like nutmeg) Maple Extract-Syrups Marjoram-Meat, chicken, fish, vegetables, breads, green salads (taste like Sage) Mint-Tea, lamb, sherbet, vegetables, desserts, carrots, cabbage Mustard, Dry or Seed-Cheese, eggs, meats, vegetables, poultry Nutmeg-Baked goods, fruit, chicken, eggs, vegetables, desserts Onion Powder-Meat, fish, poultry, vegetables, cheese, eggs, bread, rice salads (Do not use   Onion salt) Orange Extract-Desserts, baked goods Oregano-Pasta, eggs, cheese, onions, pork, lamb, fish, chicken, vegetables, green salads Paprika-Meat, fish, poultry, eggs, cheese, vegetables Parsley Flakes-Butter, vegetables, meat fish, poultry, eggs, bread, salads (certain forms may   Contain sodium Pepper-Meat fish, poultry, vegetables, eggs Peppermint Extract-Desserts, baked goods Poppy Seed-Eggs, bread, cheese, fruit dressings, baked goods, noodles, vegetables, cottage  [employer], poultry, meat, fish, cauliflower, turnips,eggs bread Saffron-Rice, bread, veal, chicken, fish, eggs Sage-Meat, fish, poultry, onions, eggplant, tomateos, pork,  stews Savory-Eggs, salads, poultry, meat, rice, vegetables, soups, pork Tarragon-Meat, poultry, fish, eggs, butter, vegetables (licorice-like flavor)  Thyme-Meat, poultry, fish, eggs, vegetables, (clover-like flavor), sauces, soups Tumeric-Salads, butter, eggs, fish, rice, vegetables (saffron-like flavor) Vanilla Extract-Baked goods, candy Vinegar-Salads, vegetables, meat marinades Walnut Extract-baked goods, candy  2. Choose your Foods Wisely   The following is a list of foods to avoid which are high in sodium:  Meats-Avoid all smoked, canned, salt cured, dried and kosher meat and fish as well as Anchovies   [employer] meats:Bologna, Liverwurst, Pastrami Canned meat or fish  Marinated herring Caviar    Pepperoni Corned Beef   Pizza Dried chipped beef  Salami Frozen breaded fish or meat Salt pork Frankfurters or hot dogs  Sardines Gefilte fish   Sausage Ham (boiled ham, Proscuitto Smoked butt    spiced ham)   Spam      TV Dinners Vegetables Canned vegetables (Regular) Relish Canned mushrooms  Sauerkraut Olives    Tomato juice Pickles  Bakery and Dessert Products Canned puddings  Cream pies Cheesecake   Decorated cakes Cookies  Beverages/Juices Tomato juice, regular  Gatorade   V-8 vegetable juice, regular  Breads and Cereals Biscuit mixes   Salted potato chips, corn chips, pretzels Bread stuffing mixes  Salted crackers and rolls Pancake and waffle mixes Self-rising flour  Seasonings Accent    Meat sauces Barbecue sauce  Meat tenderizer Catsup    Monosodium glutamate (MSG) Celery salt   Onion salt Chili sauce   Prepared mustard Garlic salt   Salt, seasoned salt, sea salt Gravy mixes   Soy sauce Horseradish   Steak sauce Ketchup   Tartar sauce Lite salt    Teriyaki sauce Marinade mixes   Worcestershire sauce  Others  Baking powder   Cocoa and cocoa mixes Baking soda   Commercial casserole mixes Candy-caramels, chocolate  Dehydrated soups    Bars,  fudge,nougats  Instant rice and pasta mixes Canned broth or soup  Maraschino cherries Cheese, aged and processed cheese and cheese spreads  Learning Assessment Quiz  Indicated T (for True) or F (for False) for each of the following statements:  1. _____ Fresh fruits and vegetables and unprocessed grains are generally low in sodium 2. _____ Water may contain a considerable amount of sodium, depending on the source 3. _____ You can always tell if a food is high in sodium by tasting it 4. _____ Certain laxatives my be high in sodium and should be avoided unless prescribed   by a physician or pharmacist 5. _____ Salt substitutes may be used freely by anyone on a sodium restricted diet 6. _____ Sodium is present in table salt, food additives and as a natural component of   most foods 7. _____ Table salt is approximately 90% sodium 8. _____ Limiting sodium intake may help prevent excess fluid accumulation in the body 9. _____ On a sodium-restricted diet, seasonings such as bouillon soy sauce, and    cooking wine should be used in place of table salt 10. _____ On an ingredient list, a product which lists monosodium glutamate as the first   ingredient is an appropriate food to include on a low sodium diet  Circle the best answer(s) to the following statements (Hint: there may be more than one correct answer)  11. On a low-sodium diet, some acceptable snack items are:    A. Olives  F. Bean dip   K. Grapefruit juice    B. Salted Pretzels G. Commercial Popcorn   L. Canned peaches    C. Carrot Sticks  H. Bouillon   M. Unsalted nuts   D. [place] fries  I. Peanut butter crackers N. Salami   E. Sweet pickles J. Tomato Juice   O. Pizza  12.  Seasonings that may be used freely on a reduced - sodium diet include   A. Lemon wedges F.Monosodium glutamate K. Celery seed    B.Soysauce   G. Pepper   L. Mustard powder   C. Sea salt  H. Cooking wine  M. Onion flakes   D. Vinegar  E. Prepared  horseradish N. Salsa   E. Sage   J. Worcestershire sauce  O. Chutney

## 2021-01-24 ENCOUNTER — Other Ambulatory Visit: Payer: Self-pay | Admitting: Family Medicine

## 2021-01-24 ENCOUNTER — Other Ambulatory Visit: Payer: Self-pay | Admitting: Internal Medicine

## 2021-02-17 NOTE — Patient Instructions (Signed)
It was great to see you! Thank you for allowing me to participate in your care!  Our plans for today:  -I have sent MiraLAX to the pharmacy.  I would like for you to start with 1 scoop per day and slowly increase to 2 scoops per day and you can continue to increase from there until you have a soft bowel movement each day. -We did a pregnancy test today which was negative, I would like for you to follow-up with your primary care doctor to further investigate if you are in menopause at this time -We checked her A1c today which was 5.9.  Continue your Metformin, I recommend that you have your A1c checked in about 3 months.  Take care and seek immediate care sooner if you develop any concerns.   Dr. Lurline Del, [place]

## 2021-02-17 NOTE — Progress Notes (Signed)
    SUBJECTIVE:   CHIEF COMPLAINT / HPI:   Stomach bloating: Patient is a 45 year old female who presents today to discuss stomach bloating.  She states she has had lots of bloating and gas for about 3 weeks.  She has hard bowel movements which are painful.  She has tried some chamomile tea and some over-the-counter laxatives but they did not help.  She has not yet tried MiraLAX.  She has a history of constipation.  Amenorrhea: Patient states previously she had a period about every 1 to 2 months however since September she has had no further menstrual bleeding. She thinks she may be in menopause but is not sure.  T2DM: Continues on Metformin.  Spanish interpreter used for the entire duration of the patient encounter.  PERTINENT  PMH / PSH: History of diabetes  OBJECTIVE:   Pulse (!) 53   Ht 5\' 2"  (1.575 m)   Wt 209 lb 3.2 oz (94.9 kg)   LMP 09/01/2020   SpO2 98%   BMI 38.26 kg/m    General: NAD, pleasant, able to participate in exam Cardiac: Mildly bradycardic but regular, no murmurs. Respiratory: CTAB, normal effort, No wheezes, rales or rhonchi Abdomen: Bowel sounds present, mild left lower quadrant abdominal discomfort to palpation, mild distention but abdomen soft Psych: Normal affect and mood  ASSESSMENT/PLAN:   Abdominal discomfort Assessment: 45 year old female with abdominal discomfort for the past 2 weeks which is coincided with an increase in gas and constipation.  Patient has tried some chamomile tea and over-the-counter laxatives but have not had relief.  She continues to have hard bowel movements.  Patient has not yet tried MiraLAX.  She denies any dysuria.  No suprapubic discomfort on physical exam today.  Physical exam does show some discomfort in the left lower quadrant consistent with constipation.  No peritoneal findings on physical exam. Plan: -We will initiate MiraLAX with the plan to titrate this up to where the patient has a soft bowel movement each  day -Plan to follow-up if this does not help the patient in the next 2 to 3 weeks -No concern for checking urine for urinary tract infection at this time  Amenorrhea Assessment: 45 year old female who states she has not had a menstrual period since September.  She previously had them about every month or every other month.  Physical exam with some left lower quadrant discomfort likely due to her constipation.  Pregnancy test negative in the office today.  More than likely she has reached menopause a bit early, however will have patient follow-up with her primary care doctor to investigate further Plan: -Pregnancy test negative as above -Follow-up with PCP  DM2 (diabetes mellitus, type 2) ([place]) A1c 5.9, continue current meds, follow-up in 3 months    Lurline Del, [place]    This note was prepared using Dragon voice recognition software and may include unintentional dictation errors due to the inherent limitations of voice recognition software.

## 2021-02-18 ENCOUNTER — Other Ambulatory Visit: Payer: Self-pay

## 2021-02-18 ENCOUNTER — Encounter: Payer: Self-pay | Admitting: Family Medicine

## 2021-02-18 ENCOUNTER — Ambulatory Visit (INDEPENDENT_AMBULATORY_CARE_PROVIDER_SITE_OTHER): Payer: Medicaid Other | Admitting: Family Medicine

## 2021-02-18 VITALS — HR 53 | Ht 62.0 in | Wt 209.2 lb

## 2021-02-18 DIAGNOSIS — N912 Amenorrhea, unspecified: Secondary | ICD-10-CM | POA: Insufficient documentation

## 2021-02-18 DIAGNOSIS — E1159 Type 2 diabetes mellitus with other circulatory complications: Secondary | ICD-10-CM

## 2021-02-18 DIAGNOSIS — Z23 Encounter for immunization: Secondary | ICD-10-CM | POA: Diagnosis not present

## 2021-02-18 DIAGNOSIS — R109 Unspecified abdominal pain: Secondary | ICD-10-CM | POA: Insufficient documentation

## 2021-02-18 HISTORY — DX: Amenorrhea, unspecified: N91.2

## 2021-02-18 LAB — POCT GLYCOSYLATED HEMOGLOBIN (HGB A1C): Hemoglobin A1C: 5.9 % — AB (ref 4.0–5.6)

## 2021-02-18 LAB — POCT URINE PREGNANCY: Preg Test, Ur: NEGATIVE

## 2021-02-18 MED ORDER — POLYETHYLENE GLYCOL 3350 17 GM/SCOOP PO POWD: ORAL | 0 refills | Status: DC

## 2021-02-18 NOTE — Assessment & Plan Note (Signed)
A1c 5.9, continue current meds, follow-up in 3 months

## 2021-02-18 NOTE — Assessment & Plan Note (Signed)
Assessment: 45 year old female with abdominal discomfort for the past 2 weeks which is coincided with an increase in gas and constipation.  Patient has tried some chamomile tea and over-the-counter laxatives but have not had relief.  She continues to have hard bowel movements.  Patient has not yet tried MiraLAX.  She denies any dysuria.  No suprapubic discomfort on physical exam today.  Physical exam does show some discomfort in the left lower quadrant consistent with constipation.  No peritoneal findings on physical exam. Plan: -We will initiate MiraLAX with the plan to titrate this up to where the patient has a soft bowel movement each day -Plan to follow-up if this does not help the patient in the next 2 to 3 weeks -No concern for checking urine for urinary tract infection at this time

## 2021-02-18 NOTE — Assessment & Plan Note (Addendum)
Assessment: 45 year old female who states she has not had a menstrual period since September.  She previously had them about every month or every other month.  Physical exam with some left lower quadrant discomfort likely due to her constipation.  Pregnancy test negative in the office today.  More than likely she has reached menopause a bit early, however will have patient follow-up with her primary care doctor to investigate further Plan: -Pregnancy test negative as above -Follow-up with PCP

## 2021-03-09 ENCOUNTER — Other Ambulatory Visit: Payer: Self-pay

## 2021-03-09 ENCOUNTER — Ambulatory Visit (INDEPENDENT_AMBULATORY_CARE_PROVIDER_SITE_OTHER): Payer: Medicaid Other | Admitting: Podiatry

## 2021-03-09 DIAGNOSIS — M722 Plantar fascial fibromatosis: Secondary | ICD-10-CM

## 2021-03-09 DIAGNOSIS — M216X9 Other acquired deformities of unspecified foot: Secondary | ICD-10-CM | POA: Diagnosis not present

## 2021-03-09 DIAGNOSIS — M21869 Other specified acquired deformities of unspecified lower leg: Secondary | ICD-10-CM

## 2021-03-11 ENCOUNTER — Encounter: Payer: Self-pay | Admitting: Podiatry

## 2021-03-11 NOTE — Progress Notes (Signed)
Subjective:  Patient ID: Debra Diaz, female    DOB: [birth year],  MRN: [id number]  Chief Complaint  Patient presents with  . Plantar Warts  . Plantar Fasciitis    PT stated that she is still having some pain but stated that the injection was helpful last time    45 y.o. female presents with the above complaint.  Patient presents with complaint of bilateral heel pain.  She presents with the Spanish translation.  She is a diabetic with last A1c of 6.8.  She states the injection that I gave her last time helped considerably.  To give her many months of relief.  She has not thought about any kind of insoles or shoe gear modification.  She denies any other acute complaints.   Review of Systems: Negative except as noted in the HPI. Denies N/V/F/Ch.  Past Medical History:  Diagnosis Date  . Anemia   . Coronary artery disease   . Diabetes mellitus without complication ([place])   . Hypertension   . Morbid obesity ([place])   . 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  Accu-Chek FastClix Lancets MISC, USE TO CHECK BLOOD SUGAR UP TO FOUR TIMES DAILY, Disp: 102 each, Rfl: 6 .  aspirin EC 81 MG EC tablet, Take 1 tablet (81 mg total) by mouth daily., Disp: , Rfl:  .  atorvastatin (LIPITOR) 40 MG tablet, TAKE 1 TABLET(40 MG) BY MOUTH DAILY AT 6 PM, Disp: 90 tablet, Rfl: 3 .  blood glucose meter kit and supplies KIT, Dispense based on patient and insurance preference. Use up to four times daily as directed. (FOR ICD-9 250.00, 250.01)., Disp: 1 each, Rfl: 0 .  blood glucose meter kit and supplies KIT, Dispense based on patient and insurance preference. Use up to four times daily as directed. (FOR ICD-9 250.00, 250.01)., Disp: 1 each, Rfl: 0 .  Blood Pressure KIT, Check blood pressure twice a day.  Dx code: I56, Disp: 1 kit, Rfl: 0 .  ferrous sulfate 325 (65 FE) MG EC tablet, Take 1 tablet (325 mg total) by mouth in the morning, at  noon, and at bedtime., Disp: 90 tablet, Rfl: 1 .  furosemide (LASIX) 20 MG tablet, Take 1 tablet (20 mg total) by mouth once a week. Take with potassium tablet, Disp: 15 tablet, Rfl: 3 .  glucose blood (ACCU-CHEK GUIDE) test strip, USE TO TEST FOUR TIMES DAILY AS DIRECTED, Disp: 350 strip, Rfl: 1 .  isosorbide mononitrate (IMDUR) 30 MG 24 hr tablet, Take 1 tablet (30 mg total) by mouth daily., Disp: 90 tablet, Rfl: 3 .  lisinopril-hydrochlorothiazide (ZESTORETIC) 20-25 MG tablet, TAKE 1 TABLET BY MOUTH DAILY, Disp: 90 tablet, Rfl: 0 .  metFORMIN (GLUCOPHAGE) 500 MG tablet, TAKE 1 TABLET(500 MG) BY MOUTH DAILY WITH BREAKFAST, Disp: 90 tablet, Rfl: 3 .  polyethylene glycol powder (GLYCOLAX/MIRALAX) 17 GM/SCOOP powder, Start with one scoop daily, increase to two scoops daily as needed for soft bowel movement each day., Disp: 500 g, Rfl: 0 .  potassium chloride (KLOR-CON) 10 MEQ tablet, TAKE 1 TABLET BY MOUTH ONCE WEEKLY SAME DAY AS DAY AS FUROSEMIDE, Disp: 15 tablet, Rfl: 3  Social History   Tobacco Use  Smoking Status Former Smoker  . Quit date: 11/17/2017  . Years since quitting: 3.3  Smokeless Tobacco Never Used    No Known Allergies Objective:  There were no vitals filed for this visit. There is no height or weight on  file to calculate BMI. Constitutional Well developed. Well nourished.  Vascular Dorsalis pedis pulses palpable bilaterally. Posterior tibial pulses palpable bilaterally. Capillary refill normal to all digits.  No cyanosis or clubbing noted. Pedal hair growth normal.  Neurologic Normal speech. Oriented to person, place, and time. Epicritic sensation to light touch grossly present bilaterally.  Dermatologic Nails well groomed and normal in appearance. No open wounds. No skin lesions.  Orthopedic: Normal joint ROM without pain or crepitus bilaterally. No visible deformities. Tender to palpation at the calcaneal tuber bilaterally. No pain with calcaneal squeeze  bilaterally. Ankle ROM diminished range of motion bilaterally. Silfverskiold Test: positive bilaterally.   Radiographs: Taken and reviewed.  None  Assessment:   1. Plantar fasciitis of right foot   2. Plantar fasciitis of left foot   3. Gastrocnemius equinus, unspecified laterality    Plan:  Patient was evaluated and treated and all questions answered.  Plantar Fasciitis, bilaterally with underlying gastrocnemius equinus bilateral - XR reviewed as above.  - Educated on icing and stretching. Instructions given.  - Injection delivered to the plantar fascia as below. - DME: None - Pharmacologic management: None -Also discussed with her shoe gear modification as well.  Procedure: Injection Tendon/Ligament Location: Bilateral plantar fascia at the glabrous junction; medial approach. Skin Prep: alcohol Injectate: 0.5 cc 0.5% marcaine plain, 0.5 cc of 1% Lidocaine, 0.5 cc kenalog 10. Disposition: Patient tolerated procedure well. Injection site dressed with a band-aid.  No follow-ups on file.

## 2021-03-21 ENCOUNTER — Other Ambulatory Visit: Payer: Self-pay

## 2021-03-21 ENCOUNTER — Encounter (HOSPITAL_COMMUNITY): Payer: Self-pay

## 2021-03-21 ENCOUNTER — Ambulatory Visit (HOSPITAL_COMMUNITY)
Admission: EM | Admit: 2021-03-21 | Discharge: 2021-03-21 | Disposition: A | Discharge status: Home / Self Care | Payer: Medicaid Other | Attending: Student | Admitting: Student

## 2021-03-21 ENCOUNTER — Ambulatory Visit (INDEPENDENT_AMBULATORY_CARE_PROVIDER_SITE_OTHER): Payer: Medicaid Other

## 2021-03-21 DIAGNOSIS — Z1152 Encounter for screening for COVID-19: Secondary | ICD-10-CM | POA: Diagnosis present

## 2021-03-21 DIAGNOSIS — J4521 Mild intermittent asthma with (acute) exacerbation: Secondary | ICD-10-CM | POA: Diagnosis present

## 2021-03-21 DIAGNOSIS — E1169 Type 2 diabetes mellitus with other specified complication: Secondary | ICD-10-CM | POA: Diagnosis present

## 2021-03-21 DIAGNOSIS — J069 Acute upper respiratory infection, unspecified: Secondary | ICD-10-CM

## 2021-03-21 DIAGNOSIS — R0602 Shortness of breath: Secondary | ICD-10-CM

## 2021-03-21 DIAGNOSIS — R0789 Other chest pain: Secondary | ICD-10-CM | POA: Diagnosis not present

## 2021-03-21 DIAGNOSIS — Z8679 Personal history of other diseases of the circulatory system: Secondary | ICD-10-CM

## 2021-03-21 DIAGNOSIS — J209 Acute bronchitis, unspecified: Secondary | ICD-10-CM | POA: Diagnosis not present

## 2021-03-21 MED ORDER — PROMETHAZINE-DM 6.25-15 MG/5ML PO SYRP: 5.0000 mL | ORAL_SOLUTION | Freq: Four times a day (QID) | ORAL | 0 refills | Status: DC | PRN

## 2021-03-21 MED ORDER — PREDNISONE 10 MG (21) PO TBPK: ORAL_TABLET | Freq: Every day | ORAL | 0 refills | Status: DC

## 2021-03-21 MED ORDER — BENZONATATE 100 MG PO CAPS: 100.0000 mg | ORAL_CAPSULE | Freq: Three times a day (TID) | ORAL | 0 refills | Status: DC

## 2021-03-21 MED ORDER — ALBUTEROL SULFATE HFA 108 (90 BASE) MCG/ACT IN AERS: 1.0000 | INHALATION_SPRAY | Freq: Four times a day (QID) | RESPIRATORY_TRACT | 0 refills | Status: DC | PRN

## 2021-03-21 NOTE — Discharge Instructions (Addendum)
-  For your bronchitis, start the prednisone taper.  Try to take this earlier in the day as it can give you energy. -Also use the albuterol inhaler as needed for shortness of breath and cough. -Promethazine DM cough syrup for congestion/cough. This could make you drowsy, so take at night before bed. -Tessalon (Benzonatate) as needed for cough. Take one pill up to 3x daily (every 8 hours) -For fever/chills, body aches, headaches-Take Tylenol 1000 mg 3 times daily, and ibuprofen 800 mg 3 times daily with food.  You can take these together, or alternate every 3-4 hours. -Your Covid test should come back in about 1 day.  This will go straight to email in my chart. -Make sure to drink plenty of water and rest. -Seek additional medical attention if your symptoms worsen or persist, like worsening shortness of breath despite treatment, worsening of dizziness, chest pain at rest, weakness in 1 arm or 1 leg, new fever/chills, etc.

## 2021-03-21 NOTE — ED Provider Notes (Signed)
[facility]    CSN: [id number] Arrival date & time: 03/21/21  1515      History   Chief Complaint Chief Complaint  Patient presents with  . Cough  . Dizziness  . Generalized Body Aches    HPI Debra Diaz is a 45 y.o. female presenting with viral URI symptoms x1 week.  Medical history of CAD, diabetes, hypertension, morbid obesity, chest pain.  Self endorsed history of asthma that she is currently taking no inhalers for.  Today presenting with lightheadedness with standing, generalized body aches, generalized weakness, chest pain with coughing, shortness of breath on exertion, fatigue. Denies chest pain at rest, shortness of breath, dizziness with sitting. Denies ear pain, hearing changes, tinnitus, sensation of room spinning.  Denies abdominal pain, nausea, vomiting, diarrhea.  Denies fever/chills, has not taken antipyretic today. This patient is closely followed by cardiology for history of chest pain with dizziness, last visit with them was 2 months ago.  HPI  Past Medical History:  Diagnosis Date  . Anemia   . Coronary artery disease   . Diabetes mellitus without complication ([place])   . Hypertension   . Morbid obesity ([place])   . 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 Abdominal discomfort 02/18/2021  . Amenorrhea 02/18/2021  . Asymptomatic microscopic hematuria 10/22/2020  . Bradycardia 02/20/2020  . Chest pain 02/15/2020  . Possible exposure to STD 02/11/2020  . Abnormal uterine bleeding 02/11/2020  . Genital warts 02/11/2020  . Constipation 01/09/2018  . Breast pain, right 10/03/2017  . Irregular menses 07/02/2017  . Leukocytosis 10/03/2016  . Anxiety reaction 08/15/2016  . Healthcare maintenance 06/12/2016  . Concussion 05/05/2016  . 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 Morbid obesity ([place])   . Chest pain with high risk of  acute coronary syndrome 02/27/2015  . Nonintractable episodic headache 02/26/2015  . Elevated troponin I level 02/26/2015  . HTN (hypertension) 02/26/2015  . DM2 (diabetes mellitus, type 2) ([place]) 02/26/2015    Past Surgical History:  Procedure Laterality Date  . [place] and 2011   x 2  . LEFT HEART CATHETERIZATION WITH CORONARY ANGIOGRAM N/A 03/02/2015   Procedure: LEFT HEART CATHETERIZATION WITH CORONARY ANGIOGRAM;  Surgeon: [name], MD;  Location: [facility] CATH LAB;  Service: Cardiovascular;  Laterality: N/A;  . MINIMALLY INVASIVE EXCISION OF ATRIAL MYXOMA N/A 03/04/2015   Procedure: MINIMALLY INVASIVE RESECTION OF LEFT ATRIAL MYXOMA ( USING A BILAYER PATCH CLOSURE);  Surgeon: [name], MD;  Location: [facility];  Service: Open Heart Surgery;  Laterality: N/A;  . myxoma N/A    chest  . TEE WITHOUT CARDIOVERSION N/A 03/01/2015   Procedure: TRANSESOPHAGEAL ECHOCARDIOGRAM (TEE);  Surgeon: [name], MD;  Location: [facility] ENDOSCOPY;  Service: Cardiovascular;  Laterality: N/A;  . TEE WITHOUT CARDIOVERSION N/A 03/04/2015   Procedure: TRANSESOPHAGEAL ECHOCARDIOGRAM (TEE);  Surgeon: [name], MD;  Location: [facility];  Service: Open Heart Surgery;  Laterality: N/A;  . TUBAL LIGATION  2011    OB History    Gravida  6   Para  5   Term  5   Preterm      AB  1   Living        SAB  1   IAB      Ectopic      Multiple      Live  Births               Home Medications    Prior to Admission medications   Medication Sig Start Date End Date Taking? Authorizing Provider  albuterol (VENTOLIN HFA) 108 (90 Base) MCG/ACT inhaler Inhale 1-2 puffs into the lungs every 6 (six) hours as needed for wheezing or shortness of breath. 03/21/21  Yes Hazel Sams, PA-C  benzonatate (TESSALON) 100 MG capsule Take 1 capsule (100 mg total) by mouth every 8 (eight) hours. 03/21/21  Yes Hazel Sams, PA-C  lisinopril-hydrochlorothiazide (ZESTORETIC) 20-25 MG tablet TAKE 1  TABLET BY MOUTH DAILY 01/24/21  Yes Carollee Leitz, MD  predniSONE (STERAPRED UNI-PAK 21 TAB) 10 MG (21) TBPK tablet Take by mouth daily. Take 6 tabs by mouth daily  for 2 days, then 5 tabs for 2 days, then 4 tabs for 2 days, then 3 tabs for 2 days, 2 tabs for 2 days, then 1 tab by mouth daily for 2 days 03/21/21  Yes Hazel Sams, PA-C  promethazine-dextromethorphan (PROMETHAZINE-DM) 6.25-15 MG/5ML syrup Take 5 mLs by mouth 4 (four) times daily as needed for cough. 03/21/21  Yes Hazel Sams, PA-C  Accu-Chek FastClix Lancets MISC USE TO CHECK BLOOD SUGAR UP TO FOUR TIMES DAILY 05/13/20 05/13/21  Kathrene Alu, MD  aspirin EC 81 MG EC tablet Take 1 tablet (81 mg total) by mouth daily. 02/17/20   Meccariello, Bernita Raisin, DO  atorvastatin (LIPITOR) 40 MG tablet TAKE 1 TABLET(40 MG) BY MOUTH DAILY AT 6 PM 02/16/20   Winfrey, Alcario Drought, MD  blood glucose meter kit and supplies KIT Dispense based on patient and insurance preference. Use up to four times daily as directed. (FOR ICD-9 250.00, 250.01). 07/14/20   Carollee Leitz, MD  blood glucose meter kit and supplies KIT Dispense based on patient and insurance preference. Use up to four times daily as directed. (FOR ICD-9 250.00, 250.01). 10/21/20   Carollee Leitz, MD  Blood Pressure KIT Check blood pressure twice a day.  Dx code: I10 11/18/20   Carollee Leitz, MD  ferrous sulfate 325 (65 FE) MG EC tablet Take 1 tablet (325 mg total) by mouth in the morning, at noon, and at bedtime. 10/22/20   Meccariello, Bernita Raisin, DO  furosemide (LASIX) 20 MG tablet Take 1 tablet (20 mg total) by mouth once a week. Take with potassium tablet 01/04/21   Imogene Burn, PA-C  glucose blood (ACCU-CHEK GUIDE) test strip USE TO TEST FOUR TIMES DAILY AS DIRECTED 01/24/21   Carollee Leitz, MD  isosorbide mononitrate (IMDUR) 30 MG 24 hr tablet Take 1 tablet (30 mg total) by mouth daily. 01/04/21   Imogene Burn, PA-C  metFORMIN (GLUCOPHAGE) 500 MG tablet TAKE 1 TABLET(500 MG) BY MOUTH DAILY WITH  BREAKFAST 10/21/20   Carollee Leitz, MD  polyethylene glycol powder [facility]) 17 GM/SCOOP powder Start with one scoop daily, increase to two scoops daily as needed for soft bowel movement each day. 02/18/21   Lurline Del, DO  potassium chloride (KLOR-CON) 10 MEQ tablet TAKE 1 TABLET BY MOUTH ONCE WEEKLY SAME DAY AS DAY AS FUROSEMIDE 01/24/21   Imogene Burn, PA-C    Family History Family History  Problem Relation Age of Onset  . Hypertension Mother   . Diabetes Father   . Asthma Son   . Breast cancer Maternal Grandmother     Social History Social History   Tobacco Use  . Smoking status: Former Audiological scientist  date: 11/17/2017    Years since quitting: 3.3  . Smokeless tobacco: Never Used  Vaping Use  . Vaping Use: Never used  Substance Use Topics  . Alcohol use: Yes    Comment: occasional  . Drug use: No     Allergies   Patient has no known allergies.   Review of Systems Review of Systems  Constitutional: Negative for appetite change, chills and fever.  HENT: Positive for congestion. Negative for ear pain, rhinorrhea, sinus pressure, sinus pain and sore throat.   Eyes: Negative for redness and visual disturbance.  Respiratory: Positive for cough, shortness of breath (on exertion) and wheezing. Negative for chest tightness.   Cardiovascular: Negative for chest pain and palpitations.       Chest wall pain  Gastrointestinal: Negative for abdominal pain, constipation, diarrhea, nausea and vomiting.  Genitourinary: Negative for dysuria, frequency and urgency.  Musculoskeletal: Negative for myalgias.  Neurological: Negative for dizziness, weakness and headaches.  Psychiatric/Behavioral: Negative for confusion.  All other systems reviewed and are negative.    Physical Exam Triage Vital Signs ED Triage Vitals  Enc Vitals Group     BP      Pulse      Resp      Temp      Temp src      SpO2      Weight      Height      Head Circumference      Peak Flow       Pain Score      Pain Loc      Pain Edu?      Excl. in [place]?    No data found.  Updated Vital Signs BP (!) 148/80 (BP Location: Left Arm)   Pulse (!) 56   Temp 99.6 F (37.6 C) (Oral)   Resp (!) 22   LMP 08/21/2020 Comment: 7 months.   Visual Acuity Right Eye Distance:   Left Eye Distance:   Bilateral Distance:    Right Eye Near:   Left Eye Near:    Bilateral Near:     Physical Exam Vitals reviewed.  Constitutional:      General: She is not in acute distress.    Appearance: Normal appearance. She is not ill-appearing or diaphoretic.  HENT:     Head: Normocephalic and atraumatic.     Right Ear: Hearing, tympanic membrane, ear canal and external ear normal. No swelling or tenderness. There is no impacted cerumen. No mastoid tenderness. Tympanic membrane is not perforated, erythematous, retracted or bulging.     Left Ear: Hearing, tympanic membrane, ear canal and external ear normal. No swelling or tenderness. There is no impacted cerumen. No mastoid tenderness. Tympanic membrane is not perforated, erythematous, retracted or bulging.     Nose:     Right Sinus: No maxillary sinus tenderness or frontal sinus tenderness.     Left Sinus: No maxillary sinus tenderness or frontal sinus tenderness.     Mouth/Throat:     Mouth: Mucous membranes are moist.     Pharynx: Uvula midline. No oropharyngeal exudate or posterior oropharyngeal erythema.     Tonsils: No tonsillar exudate.  Eyes:     Extraocular Movements: Extraocular movements intact.     Pupils: Pupils are equal, round, and reactive to light.  Cardiovascular:     Rate and Rhythm: Regular rhythm. Bradycardia present.     Pulses:          Radial pulses are 2+ on the  right side and 2+ on the left side.     Heart sounds: Normal heart sounds.  Pulmonary:     Effort: Pulmonary effort is normal. Tachypnea present. No accessory muscle usage, prolonged expiration or respiratory distress.     Breath sounds: Normal air entry. Wheezing  present. No decreased breath sounds, rhonchi or rales.     Comments: Scattered wheezes throughout Appears comfortable Chest:     Chest wall: Tenderness present.  Abdominal:     General: Abdomen is flat. Bowel sounds are normal.     Palpations: Abdomen is soft.     Tenderness: There is no abdominal tenderness. There is no guarding or rebound.  Musculoskeletal:     Right lower leg: No edema.     Left lower leg: No edema.  Lymphadenopathy:     Cervical: No cervical adenopathy.  Skin:    General: Skin is warm.     Capillary Refill: Capillary refill takes less than 2 seconds.  Neurological:     General: No focal deficit present.     Mental Status: She is alert and oriented to person, place, and time.     Cranial Nerves: Cranial nerves are intact. No cranial nerve deficit.     Sensory: Sensation is intact. No sensory deficit.     Motor: Motor function is intact. No weakness.     Coordination: Coordination is intact.     Gait: Gait is intact.     Comments: CN 2-12 grossly intact PERRLA, EOMI  Psychiatric:        Attention and Perception: Attention and perception normal.        Mood and Affect: Mood and affect normal.        Behavior: Behavior normal. Behavior is cooperative.        Thought Content: Thought content normal.        Judgment: Judgment normal.      UC Treatments / Results  Labs (all labs ordered are listed, but only abnormal results are displayed) Labs Reviewed  SARS CORONAVIRUS 2 (TAT 6-24 HRS)    EKG   Radiology DG Chest 2 View  Result Date: 03/21/2021 CLINICAL DATA:  Shortness of breath EXAM: CHEST - 2 VIEW COMPARISON:  02/15/2020 FINDINGS: Right base atelectasis. Left lung clear. Heart is normal size. No effusions. No acute bony abnormality. IMPRESSION: Right base atelectasis. Electronically Signed   By: Rolm Baptise M.D.   On: 03/21/2021 17:18    Procedures Procedures (including critical care time)  Medications Ordered in UC Medications - No data to  display  Initial Impression / Assessment and Plan / UC Course  I have reviewed the triage vital signs and the nursing notes.  Pertinent labs & imaging results that were available during my care of the patient were reviewed by me and considered in my medical decision making (see chart for details).     This patient is a 45 year old female presenting with acute bronchitis.  Low-grade fever and mildly tachypneic, mildly bradycardic at 55 but chart review shows this is her baseline. Scattered wheezes throughout. Benign neuro exam today.  Has not taken antipyretics today.  "Chest pain" is reproducible- chest wall pain. History of CAD. EKG today with sinus bradycardia but otherwise unchanged from 2021 EKG.  This patient is closely followed by cardiology for chest pain dizziness, last visit with him was 2 months ago.  CXR Right base atelectasis. Films interpreted by myself and radiologist.  Covid PCR sent.  She has already passed the mandatory 5  days isolation as per CDC guidelines.  Albuterol inhaler and prednisone sent as below. Also sent promethazine and tessalon.  Type 2 diabetes- A1c 5.9 three weeks ago. rec monitoring sugars at home, f/u with [place] or PCP and stop prednisone if sugars rise >20 points. Continue current regimen.  Tylenol and ibuprofen for symptomatic relief.  Strict ED return precautions discussed.  This chart was dictated using voice recognition software, Dragon. Despite the best efforts of this provider to proofread and correct errors, errors may still occur which can change documentation meaning.  Coding this visit as a level 5 based on time as I spent over 60 minutes evaluating patient's many complaints, both acute and chronic.   Final Clinical Impressions(s) / UC Diagnoses   Final diagnoses:  Acute bronchitis, unspecified organism  Encounter for screening for COVID-19  Viral URI with cough  History of coronary artery disease  Type 2 diabetes mellitus with other  specified complication, without long-term current use of insulin (HCC)  Mild intermittent asthma with acute exacerbation  Chest wall pain     Discharge Instructions     -For your bronchitis, start the prednisone taper.  Try to take this earlier in the day as it can give you energy. -Also use the albuterol inhaler as needed for shortness of breath and cough. -Promethazine DM cough syrup for congestion/cough. This could make you drowsy, so take at night before bed. -Tessalon (Benzonatate) as needed for cough. Take one pill up to 3x daily (every 8 hours) -For fever/chills, body aches, headaches-Take Tylenol 1000 mg 3 times daily, and ibuprofen 800 mg 3 times daily with food.  You can take these together, or alternate every 3-4 hours. -Your Covid test should come back in about 1 day.  This will go straight to email in my chart. -Make sure to drink plenty of water and rest. -Seek additional medical attention if your symptoms worsen or persist, like worsening shortness of breath despite treatment, worsening of dizziness, chest pain at rest, weakness in 1 arm or 1 leg, new fever/chills, etc.    ED Prescriptions    Medication Sig Dispense Auth. Provider   predniSONE (STERAPRED UNI-PAK 21 TAB) 10 MG (21) TBPK tablet Take by mouth daily. Take 6 tabs by mouth daily  for 2 days, then 5 tabs for 2 days, then 4 tabs for 2 days, then 3 tabs for 2 days, 2 tabs for 2 days, then 1 tab by mouth daily for 2 days 42 tablet Hazel Sams, PA-C   albuterol (VENTOLIN HFA) 108 (90 Base) MCG/ACT inhaler Inhale 1-2 puffs into the lungs every 6 (six) hours as needed for wheezing or shortness of breath. 1 each Hazel Sams, PA-C   benzonatate (TESSALON) 100 MG capsule Take 1 capsule (100 mg total) by mouth every 8 (eight) hours. 21 capsule Hazel Sams, PA-C   promethazine-dextromethorphan (PROMETHAZINE-DM) 6.25-15 MG/5ML syrup Take 5 mLs by mouth 4 (four) times daily as needed for cough. 118 mL Hazel Sams,  PA-C     PDMP not reviewed this encounter.   Hazel Sams, PA-C 03/21/21 1744

## 2021-03-21 NOTE — ED Triage Notes (Signed)
Pt presents with cough, shortness of breath on exertion, dizziness when walking body aches and weakness x 1 week. Reports chest pain and back pain when coughing . Mucinex gives no relief. Denies headache, abdominal pain, diarrhea, nausea, vision changes.   Pt states  lisinopril-hydrochlorothiazide worsens the cough.

## 2021-03-22 LAB — SARS CORONAVIRUS 2 (TAT 6-24 HRS): SARS Coronavirus 2: NEGATIVE

## 2021-04-25 ENCOUNTER — Ambulatory Visit: Payer: Medicaid Other | Admitting: Family Medicine

## 2021-04-25 NOTE — Progress Notes (Signed)
    SUBJECTIVE:   CHIEF COMPLAINT / HPI: HTN   Spanish interpreter offered, patient declines at this time.   HTN  Reports having a bp measurement at home at was elevated into 712R systolic over the past weekend. She reports feeling anxious, SOB and having chest discomfort at that time so she rested in the house. Pain resolved and blood pressure seemed to normalize. She reports having blurry vision intermittently and needs to see an eye doctor due to her med hx of HTN and DM. Patient denies current chest pain or SOB at this time.    PERTINENT  PMH / PSH:  HTN  DM   OBJECTIVE:   BP 116/75   Pulse 73   Ht 5' 2.91" (1.598 m)   Wt 201 lb (91.2 kg)   SpO2 100%   BMI 35.70 kg/m   General: female appearing stated age in no acute distress Cardio: Normal S1 and S2, no S3 or S4. Rhythm is regular. No murmurs or rubs.  Bilateral radial pulses palpable Pulm: Clear to auscultation bilaterally, no crackles, wheezing, or diminished breath sounds. Normal respiratory effort Abdomen: Bowel sounds normal. Abdomen soft and non-tender.  Extremities: No peripheral edema. Warm/ well perfused.  Neuro: pt alert and oriented x4, EOMI, PERRLA, 5/5 strength in upper and lower extremities, sensation intact throughout to light touch, normal gait    ASSESSMENT/PLAN:   HTN (hypertension) Normotensive BP in office today  Patient denies symptoms of current HA, chest pain or SOB  Encouraged patient to monitor BP once daily at different times of day and follow up in next 1-2 weeks  Refilled BP medication, lasix and iron per patient's request  amb referral to ophthalmology for HTN and DM hx      Eulis Foster, MD [place]

## 2021-04-26 ENCOUNTER — Ambulatory Visit (INDEPENDENT_AMBULATORY_CARE_PROVIDER_SITE_OTHER): Payer: Medicaid Other | Admitting: Family Medicine

## 2021-04-26 ENCOUNTER — Encounter: Payer: Self-pay | Admitting: Family Medicine

## 2021-04-26 ENCOUNTER — Other Ambulatory Visit: Payer: Self-pay

## 2021-04-26 VITALS — BP 116/75 | HR 73 | Ht 62.91 in | Wt 201.0 lb

## 2021-04-26 DIAGNOSIS — I10 Essential (primary) hypertension: Secondary | ICD-10-CM

## 2021-04-26 DIAGNOSIS — E1159 Type 2 diabetes mellitus with other circulatory complications: Secondary | ICD-10-CM

## 2021-04-26 MED ORDER — FUROSEMIDE 20 MG PO TABS: 20.0000 mg | ORAL_TABLET | ORAL | 3 refills | Status: DC

## 2021-04-26 MED ORDER — FERROUS SULFATE 325 (65 FE) MG PO TBEC: 325.0000 mg | DELAYED_RELEASE_TABLET | Freq: Three times a day (TID) | ORAL | 0 refills | Status: DC

## 2021-04-26 MED ORDER — LISINOPRIL-HYDROCHLOROTHIAZIDE 20-25 MG PO TABS: 1.0000 | ORAL_TABLET | Freq: Every day | ORAL | 0 refills | Status: DC

## 2021-04-26 NOTE — Patient Instructions (Signed)
Submitted a referral to an ophthalmologist for you to have a diabetes and hypertensive eye exam.  Please be on the look out for a call from in the next 2 weeks to help with scheduling.  I recommended she continue your blood pressure medications as prescribed.  Your blood pressure looks really good today.  I will have you follow-up with one of our physicians on May 18 at 8:50 AM to check your blood pressure and do any necessary lab work or make any medication changes at that time.  Please return to care if you begin to have worsening headache, have blood pressures consistently elevated above 150/90, began to have chest pain, difficulty breathing.   Hipertensin en los adultos Hypertension, Adult El trmino hipertensin es otra forma de denominar a la presin arterial elevada. La presin arterial elevada fuerza al corazn a trabajar ms para bombear la sangre. Esto puede causar problemas con el paso del [place]. Una lectura de presin arterial est compuesta por 2 nmeros. Hay un nmero superior (sistlico) sobre un nmero inferior (diastlico). Lo ideal es tener la presin arterial por debajo de 120/80. Las elecciones saludables pueden ayudar a Engineer, materials presin arterial, o tal vez necesite medicamentos para bajarla. Cules son las causas? Se desconoce la causa de esta afeccin. Algunas afecciones pueden estar relacionadas con la presin arterial alta. Qu incrementa el riesgo?  Fumar.  Tener diabetes mellitus tipo 2, colesterol alto, o ambos.  No hacer la cantidad suficiente de actividad fsica o ejercicio.  Tener sobrepeso.  Consumir mucha grasa, azcar, caloras o sal (sodio) en su dieta.  Beber alcohol en exceso.  Tener una enfermedad renal a largo plazo (crnica).  Tener antecedentes familiares de presin arterial alta.  Edad. Los riesgos aumentan con la edad.  Raza. El riesgo es mayor para las Retail banker.  Sexo. Antes de los 45aos, los hombres corren ms  [employer]. Despus de los 65aos, las mujeres corren ms [employer].  Tener apnea obstructiva del sueo.  Estrs. Cules son los signos o los sntomas?  Es posible que la presin arterial alta puede no cause sntomas. La presin arterial muy alta (crisis hipertensiva) puede provocar: ? Dolor de [place]. ? Sensaciones de preocupacin o nerviosismo (ansiedad). ? Falta de aire. ? Hemorragia nasal. ? Sensacin de Engineer, site (nuseas). ? Vmitos. ? Cambios en la forma de ver. ? Dolor muy intenso en el pecho. ? Convulsiones. Cmo se trata?  Esta afeccin se trata haciendo cambios saludables en el estilo de vida, por ejemplo: ? Consumir alimentos saludables. ? Hacer ms ejercicio. ? Beber menos alcohol.  El mdico puede recetarle medicamentos si los cambios en el estilo de vida no son suficientes para Child psychotherapist la presin arterial y si: ? El nmero de arriba est por encima de 130. ? El nmero de abajo est por encima de 80.  Su presin arterial personal ideal puede variar. Siga estas instrucciones en su casa: Comida y bebida  Si se lo dicen, siga el plan de alimentacin de DASH (Dietary Approaches to Stop Hypertension, Maneras de alimentarse para detener la hipertensin). Para seguir este plan: ? Llene la mitad del plato de cada comida con frutas y verduras. ? Llene un cuarto del plato de cada comida con cereales integrales. Los cereales integrales incluyen pasta integral, arroz integral y pan integral. ? Coma y beba productos lcteos con bajo contenido de grasa, como leche descremada o yogur bajo en grasas. ? Llene un cuarto del plato  de cada comida con protenas bajas en grasa [employer]). Las protenas bajas en grasa incluyen pescado, pollo sin piel, huevos, frijoles y tofu. ? Evite consumir carne grasa, carne curada y procesada, o pollo con piel. ? Evite consumir alimentos prehechos o procesados.  Consuma menos de 1500 mg de sal por  da.  No beba alcohol si: ? El mdico le indica que no lo haga. ? Est embarazada, puede estar embarazada o est tratando de quedar embarazada.  Si bebe alcohol: ? Limite la cantidad que bebe a lo siguiente:  De 0 a 1 medida por da para las mujeres.  De 0 a 2 medidas por da para los hombres. ? Est atento a la cantidad de alcohol que hay en las bebidas que toma. En los [place], una medida equivale a una botella de cerveza de 12oz (323ml), un vaso de vino de 5oz (137ml) o un vaso de una bebida alcohlica de alta graduacin de 1oz (9ml).   Estilo de vida  Trabaje con su mdico para mantenerse en un peso saludable o para perder peso. Pregntele a su mdico cul es el peso recomendable para usted.  Haga al menos 17minutos de ejercicio la [employer] de la [place]. Estos pueden incluir caminar, nadar o andar en bicicleta.  Realice al menos 30 minutos de ejercicio que fortalezca sus msculos (ejercicios de resistencia) al menos 3 das a la [place]. Estos pueden incluir levantar pesas o hacer [place].  No consuma ningn producto que contenga nicotina o tabaco, como cigarrillos, cigarrillos electrnicos y tabaco de Higher education careers adviser. Si necesita ayuda para dejar de fumar, consulte al [employer].  Controle su presin arterial en su casa tal como le indic el mdico.  Concurra a todas las visitas de seguimiento como se lo haya indicado el mdico. Esto es importante.   Medicamentos  [employer] de venta libre y los recetados solamente como se lo haya indicado el mdico. Siga cuidadosamente las indicaciones.  No omita las dosis de medicamentos para la presin arterial. Los medicamentos pierden eficacia si omite dosis. El hecho de omitir las dosis tambin [place] el riesgo de otros problemas.  Pregntele a su mdico a qu efectos secundarios o reacciones a los Careers information officer. Comunquese con un mdico si:  Piensa que tiene [place] reaccin a los medicamentos  que est tomando.  Tiene dolores de cabeza frecuentes (recurrentes).  Se siente mareado.  Tiene hinchazn en los tobillos.  Tiene problemas de visin. Solicite ayuda inmediatamente si:  Siente un dolor de cabeza muy intenso.  Empieza a sentirse desorientado (confundido).  Se siente dbil o adormecido.  Siente que va a desmayarse.  Tiene un dolor muy intenso en las siguientes zonas: ? Pecho. ? Vientre (abdomen).  Vomita ms de una vez.  Tiene dificultad para respirar. Resumen  El trmino hipertensin es otra forma de denominar a la presin arterial elevada.  La presin arterial elevada fuerza al corazn a trabajar ms para bombear la sangre.  Para la [employer], una presin arterial normal es menor que 120/80.  Las decisiones saludables pueden ayudarle a disminuir su presin arterial. Si no puede bajar su presin arterial mediante decisiones saludables, es posible que deba tomar medicamentos. Esta informacin no tiene Marine scientist el consejo del mdico. Asegrese de hacerle al mdico cualquier pregunta que tenga. Document Revised: 09/19/2018 Document Reviewed: 09/19/2018 Elsevier Patient Education  2021 [employer].

## 2021-04-28 NOTE — Assessment & Plan Note (Signed)
Normotensive BP in office today  Patient denies symptoms of current HA, chest pain or SOB  Encouraged patient to monitor BP once daily at different times of day and follow up in next 1-2 weeks  Refilled BP medication, lasix and iron per patient's request  amb referral to ophthalmology for HTN and DM hx

## 2021-05-01 ENCOUNTER — Emergency Department (HOSPITAL_BASED_OUTPATIENT_CLINIC_OR_DEPARTMENT_OTHER): Payer: Medicaid Other

## 2021-05-01 ENCOUNTER — Emergency Department (HOSPITAL_BASED_OUTPATIENT_CLINIC_OR_DEPARTMENT_OTHER)
Admission: EM | Admit: 2021-05-01 | Discharge: 2021-05-01 | Disposition: A | Discharge status: Home / Self Care | Payer: Medicaid Other | Attending: Emergency Medicine | Admitting: Emergency Medicine

## 2021-05-01 ENCOUNTER — Encounter (HOSPITAL_BASED_OUTPATIENT_CLINIC_OR_DEPARTMENT_OTHER): Payer: Self-pay | Admitting: Emergency Medicine

## 2021-05-01 ENCOUNTER — Other Ambulatory Visit: Payer: Self-pay

## 2021-05-01 DIAGNOSIS — R42 Dizziness and giddiness: Secondary | ICD-10-CM | POA: Diagnosis not present

## 2021-05-01 DIAGNOSIS — R0602 Shortness of breath: Secondary | ICD-10-CM | POA: Diagnosis not present

## 2021-05-01 DIAGNOSIS — R0789 Other chest pain: Secondary | ICD-10-CM

## 2021-05-01 DIAGNOSIS — I2510 Atherosclerotic heart disease of native coronary artery without angina pectoris: Secondary | ICD-10-CM | POA: Diagnosis not present

## 2021-05-01 DIAGNOSIS — Z7982 Long term (current) use of aspirin: Secondary | ICD-10-CM | POA: Diagnosis not present

## 2021-05-01 DIAGNOSIS — I10 Essential (primary) hypertension: Secondary | ICD-10-CM | POA: Insufficient documentation

## 2021-05-01 DIAGNOSIS — Z87891 Personal history of nicotine dependence: Secondary | ICD-10-CM | POA: Insufficient documentation

## 2021-05-01 DIAGNOSIS — Z7984 Long term (current) use of oral hypoglycemic drugs: Secondary | ICD-10-CM | POA: Insufficient documentation

## 2021-05-01 DIAGNOSIS — E119 Type 2 diabetes mellitus without complications: Secondary | ICD-10-CM | POA: Insufficient documentation

## 2021-05-01 DIAGNOSIS — Z79899 Other long term (current) drug therapy: Secondary | ICD-10-CM | POA: Insufficient documentation

## 2021-05-01 DIAGNOSIS — R079 Chest pain, unspecified: Secondary | ICD-10-CM | POA: Diagnosis present

## 2021-05-01 LAB — BASIC METABOLIC PANEL
Anion gap: 12 (ref 5–15)
BUN: 10 mg/dL (ref 6–20)
CO2: 24 mmol/L (ref 22–32)
Calcium: 9.2 mg/dL (ref 8.9–10.3)
Chloride: 97 mmol/L — ABNORMAL LOW (ref 98–111)
Creatinine, Ser: 0.75 mg/dL (ref 0.44–1.00)
GFR, Estimated: >60 mL/min (ref >60)
Glucose, Bld: 252 mg/dL — ABNORMAL HIGH (ref 70–99)
Potassium: 3.5 mmol/L (ref 3.5–5.1)
Sodium: 133 mmol/L — ABNORMAL LOW (ref 135–145)

## 2021-05-01 LAB — CBC WITH DIFFERENTIAL/PLATELET
Abs Immature Granulocytes: 0.05 10*3/uL (ref 0.00–0.07)
Basophils Absolute: 0 10*3/uL (ref 0.0–0.1)
Basophils Relative: 0 %
Eosinophils Absolute: 0.1 10*3/uL (ref 0.0–0.5)
Eosinophils Relative: 1 %
HCT: 46.3 % — ABNORMAL HIGH (ref 36.0–46.0)
Hemoglobin: 15.4 g/dL — ABNORMAL HIGH (ref 12.0–15.0)
Immature Granulocytes: 1 %
Lymphocytes Relative: 25 %
Lymphs Abs: 2.7 10*3/uL (ref 0.7–4.0)
MCH: 28.8 pg (ref 26.0–34.0)
MCHC: 33.3 g/dL (ref 30.0–36.0)
MCV: 86.7 fL (ref 80.0–100.0)
Monocytes Absolute: 0.7 10*3/uL (ref 0.1–1.0)
Monocytes Relative: 7 %
Neutro Abs: 7.4 10*3/uL (ref 1.7–7.7)
Neutrophils Relative %: 66 %
Platelets: 324 10*3/uL (ref 150–400)
RBC: 5.34 MIL/uL — ABNORMAL HIGH (ref 3.87–5.11)
RDW: 14.7 % (ref 11.5–15.5)
WBC: 11.1 10*3/uL — ABNORMAL HIGH (ref 4.0–10.5)
nRBC: 0 % (ref 0.0–0.2)

## 2021-05-01 LAB — PREGNANCY, URINE: Preg Test, Ur: NEGATIVE

## 2021-05-01 LAB — TROPONIN I (HIGH SENSITIVITY)
Troponin I (High Sensitivity): 8 ng/L (ref <18)
Troponin I (High Sensitivity): 13 ng/L (ref <18)

## 2021-05-01 LAB — D-DIMER, QUANTITATIVE: D-Dimer, Quant: 0.47 ug/mL-FEU (ref 0.00–0.50)

## 2021-05-01 MED ORDER — LORAZEPAM 1 MG PO TABS
1.0000 mg | ORAL_TABLET | Freq: Once | ORAL | Status: AC
  Administered 2021-05-01: 1 mg via ORAL
  Filled 2021-05-01: qty 1

## 2021-05-01 MED ORDER — DOXYCYCLINE HYCLATE 100 MG PO CAPS: 100.0000 mg | ORAL_CAPSULE | Freq: Two times a day (BID) | ORAL | 0 refills | Status: DC

## 2021-05-01 MED ORDER — ASPIRIN 81 MG PO CHEW
324.0000 mg | CHEWABLE_TABLET | Freq: Once | ORAL | Status: AC
  Administered 2021-05-01: 324 mg via ORAL
  Filled 2021-05-01: qty 4

## 2021-05-01 NOTE — ED Triage Notes (Signed)
Using language line # [id number], Pt c/o Left sided chest pain 10 mins prior to arrival. Pt presents with tachypnea and warm dry skin.

## 2021-05-01 NOTE — ED Notes (Signed)
For the past three days has had dizziness, weakness, HA and today chest pain began prior to attending church service. Chest pain is mid chest non-radiating.

## 2021-05-01 NOTE — ED Notes (Signed)
Primary concern for pt is her c/o HA

## 2021-05-01 NOTE — Progress Notes (Signed)
RT to evaluate pt in triage for shortness of breath. Upon arrival to room, pt with increased RR. SpO2 100% on room air and bilateral breath sounds clear/diminished. Pt endorses headache and dizziness at this time. EKG performed and reports atrial flutter. RT will hold on breathing treatments at this time until the ED MD has seen pt. Pt and RN updated on RT recommendation at this time and verbalize understanding. RT will continue to monitor and be available as needed.

## 2021-05-01 NOTE — ED Notes (Signed)
Cont to c/o HA, ED MD aware

## 2021-05-01 NOTE — ED Provider Notes (Addendum)
[facility] EMERGENCY DEPARTMENT Provider Note   CSN: [id number] Arrival date & time: 05/01/21  1210     History Chief Complaint  Patient presents with  . Chest Pain    Debra Diaz is a 45 y.o. female.  45yo F w/ PMH including T2DM, HTN, L atrial myxoma excision who p/w shortness of breath, weakness, CP. Today she was at church sitting when she began having dizziness, headache, shakiness, and feeling like she couldn't breathe. She reports associated L sided constant chest heaviness. No fever, vomiting, or cough. No leg swelling/pain, recent travel, h/o blood clot. 3 days ago her BP was high, 158/98. She reports compliance w/ medications.  The history is provided by the patient.  Chest Pain      Past Medical History:  Diagnosis Date  . Anemia   . Coronary artery disease   . Diabetes mellitus without complication ([place])   . Hypertension   . Morbid obesity ([place])   . 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 Abdominal discomfort 02/18/2021  . Amenorrhea 02/18/2021  . Asymptomatic microscopic hematuria 10/22/2020  . Bradycardia 02/20/2020  . Chest pain 02/15/2020  . Possible exposure to STD 02/11/2020  . Abnormal uterine bleeding 02/11/2020  . Genital warts 02/11/2020  . Constipation 01/09/2018  . Breast pain, right 10/03/2017  . Irregular menses 07/02/2017  . Leukocytosis 10/03/2016  . Anxiety reaction 08/15/2016  . Healthcare maintenance 06/12/2016  . Concussion 05/05/2016  . 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 Morbid obesity ([place])   . Chest pain with high risk of acute coronary syndrome 02/27/2015  . Nonintractable episodic headache 02/26/2015  . Elevated troponin I level 02/26/2015  . HTN (hypertension) 02/26/2015  . DM2 (diabetes mellitus, type 2) ([place]) 02/26/2015    Past Surgical History:  Procedure Laterality Date  .  [place] and 2011   x 2  . LEFT HEART CATHETERIZATION WITH CORONARY ANGIOGRAM N/A 03/02/2015   Procedure: LEFT HEART CATHETERIZATION WITH CORONARY ANGIOGRAM;  Surgeon: [name], MD;  Location[facility] C[facility] LAB;  Service: Cardiovascular;  Laterality: N/A;  . MINIMALLY INVASIVE EXCISION OF ATRIAL MYXOMA N/A 03/04/2015   Procedure: MINIMALLY INVASIVE RESECTION OF LEFT ATRIAL MYXOMA ( USING A BILAYER PATCH CLOSURE);  Surgeon: [name], MD;  Location: [facility];  Service: Open Heart Surgery;  Laterality: N/A;  . myxoma N/A    chest  . TEE WITHOUT CARDIOVERSION N/A 03/01/2015   Procedure: TRANSESOPHAGEAL ECHOCARDIOGRAM (TEE);  Surgeon: [name], MD;  Location[facility] ENDOSCOPY;  Service: Cardiovascular;  Laterality: N/A;  . TEE WITHOUT CARDIOVERSION N/A 03/04/2015   Procedure: TRANSESOPHAGEAL ECHOCARDIOGRAM (TEE);  Surgeon: [name], MD;  Location: [place];  Service: Open Heart Surgery;  Laterality: N/A;  . TUBAL LIGATION  2011     OB History    Gravida  6   Para  5   Term  5   Preterm      AB  1   Living        SAB  1   IAB      Ectopic      Multiple      Live Births              Family History  Problem Relation Age of Onset  . Hypertension Mother   . Diabetes Father   . Asthma Son   .  Breast cancer Maternal Grandmother     Social History   Tobacco Use  . Smoking status: Former Smoker    Quit date: 11/17/2017    Years since quitting: 3.4  . Smokeless tobacco: Never Used  Vaping Use  . Vaping Use: Never used  Substance Use Topics  . Alcohol use: Yes    Comment: occasional  . Drug use: No    Home Medications Prior to Admission medications   Medication Sig Start Date End Date Taking? Authorizing Provider  Accu-Chek FastClix Lancets MISC USE TO CHECK BLOOD SUGAR UP TO FOUR TIMES DAILY 05/13/20 05/13/21  Kathrene Alu, MD  albuterol (VENTOLIN HFA) 108 (90 Base) MCG/ACT inhaler Inhale 1-2 puffs into the lungs every 6 (six) hours as  needed for wheezing or shortness of breath. Patient not taking: Reported on 04/26/2021 03/21/21   Hazel Sams, PA-C  aspirin EC 81 MG EC tablet Take 1 tablet (81 mg total) by mouth daily. 02/17/20   Meccariello, Bernita Raisin, DO  atorvastatin (LIPITOR) 40 MG tablet TAKE 1 TABLET(40 MG) BY MOUTH DAILY AT 6 PM 02/16/20   Winfrey, Alcario Drought, MD  benzonatate (TESSALON) 100 MG capsule Take 1 capsule (100 mg total) by mouth every 8 (eight) hours. 03/21/21   Hazel Sams, PA-C  blood glucose meter kit and supplies KIT Dispense based on patient and insurance preference. Use up to four times daily as directed. (FOR ICD-9 250.00, 250.01). 07/14/20   Carollee Leitz, MD  blood glucose meter kit and supplies KIT Dispense based on patient and insurance preference. Use up to four times daily as directed. (FOR ICD-9 250.00, 250.01). 10/21/20   Carollee Leitz, MD  Blood Pressure KIT Check blood pressure twice a day.  Dx code: I10 11/18/20   Carollee Leitz, MD  ferrous sulfate 325 (65 FE) MG EC tablet Take 1 tablet (325 mg total) by mouth in the morning, at noon, and at bedtime. 04/26/21   Simmons-Robinson, Makiera, MD  furosemide (LASIX) 20 MG tablet Take 1 tablet (20 mg total) by mouth once a week. Take with potassium tablet 04/26/21   Simmons-Robinson, Makiera, MD  glucose blood (ACCU-CHEK GUIDE) test strip USE TO TEST FOUR TIMES DAILY AS DIRECTED 01/24/21   Carollee Leitz, MD  isosorbide mononitrate (IMDUR) 30 MG 24 hr tablet Take 1 tablet (30 mg total) by mouth daily. 01/04/21   Imogene Burn, PA-C  lisinopril-hydrochlorothiazide (ZESTORETIC) 20-25 MG tablet Take 1 tablet by mouth daily. 04/26/21   Simmons-Robinson, Makiera, MD  metFORMIN (GLUCOPHAGE) 500 MG tablet TAKE 1 TABLET(500 MG) BY MOUTH DAILY WITH BREAKFAST 10/21/20   Carollee Leitz, MD  polyethylene glycol powder [facility]) 17 GM/SCOOP powder Start with one scoop daily, increase to two scoops daily as needed for soft bowel movement each day. 02/18/21   Lurline Del, DO   potassium chloride (KLOR-CON) 10 MEQ tablet TAKE 1 TABLET BY MOUTH ONCE WEEKLY SAME DAY AS DAY AS FUROSEMIDE 01/24/21   Imogene Burn, PA-C  predniSONE (STERAPRED UNI-PAK 21 TAB) 10 MG (21) TBPK tablet Take by mouth daily. Take 6 tabs by mouth daily  for 2 days, then 5 tabs for 2 days, then 4 tabs for 2 days, then 3 tabs for 2 days, 2 tabs for 2 days, then 1 tab by mouth daily for 2 days Patient not taking: Reported on 04/26/2021 03/21/21   Hazel Sams, PA-C  promethazine-dextromethorphan (PROMETHAZINE-DM) 6.25-15 MG/5ML syrup Take 5 mLs by mouth 4 (four) times daily as needed for cough. Patient  not taking: Reported on 04/26/2021 03/21/21   Hazel Sams, PA-C    Allergies    Patient has no known allergies.  Review of Systems   Review of Systems  Cardiovascular: Positive for chest pain.   All other systems reviewed and are negative except that which was mentioned in HPI  Physical Exam Updated Vital Signs BP 135/88   Pulse 78   Temp 99.5 F (37.5 C) (Oral)   Resp 20   Ht 5' 2" (1.575 m)   Wt 91.2 kg   LMP 03/26/2021   SpO2 95%   BMI 36.76 kg/m   Physical Exam Vitals and nursing note reviewed.  Constitutional:      General: She is not in acute distress.    Appearance: Normal appearance. She is well-developed. She is obese.     Comments: anxious  HENT:     Head: Normocephalic and atraumatic.  Eyes:     Conjunctiva/sclera: Conjunctivae normal.  Cardiovascular:     Rate and Rhythm: Normal rate and regular rhythm.     Heart sounds: Normal heart sounds. No murmur heard.   Pulmonary:     Breath sounds: Normal breath sounds.     Comments: Mild hyperventilation Abdominal:     General: Abdomen is flat. Bowel sounds are normal. There is no distension.     Palpations: Abdomen is soft.     Tenderness: There is no abdominal tenderness.  Musculoskeletal:     Right lower leg: No edema.     Left lower leg: No edema.  Skin:    General: Skin is warm and dry.  Neurological:      Mental Status: She is alert and oriented to person, place, and time.     Comments: fluent  Psychiatric:        Mood and Affect: Mood is anxious.        Behavior: Behavior normal.     ED Results / Procedures / Treatments   Labs (all labs ordered are listed, but only abnormal results are displayed) Labs Reviewed  CBC WITH DIFFERENTIAL/PLATELET - Abnormal; Notable for the following components:      Result Value   WBC 11.1 (*)    RBC 5.34 (*)    Hemoglobin 15.4 (*)    HCT 46.3 (*)    All other components within normal limits  BASIC METABOLIC PANEL - Abnormal; Notable for the following components:   Sodium 133 (*)    Chloride 97 (*)    Glucose, Bld 252 (*)    All other components within normal limits  D-DIMER, QUANTITATIVE  PREGNANCY, URINE  TROPONIN I (HIGH SENSITIVITY)  TROPONIN I (HIGH SENSITIVITY)    EKG EKG Interpretation  Date/Time:  Sunday May 01 2021 12:18:21 EDT Ventricular Rate:  92 PR Interval:    QRS Duration: 76 QT Interval:  346 QTC Calculation: 427 R Axis:   -2 Text Interpretation: Atrial flutter Septal infarct , age undetermined Abnormal ECG possible A fib but baseline artifact, needs repeat Confirmed by Theotis Burrow [phone]) on 05/01/2021 12:31:53 PM   Radiology DG Chest 2 View  Result Date: 05/01/2021 CLINICAL DATA:  Shortness of breath Chest pressure Dizziness EXAM: CHEST - 2 VIEW COMPARISON:  03/21/2021 FINDINGS: Airspace opacity at the right lung base suspicious for pneumonia. Left lung is clear. Cardiomediastinal silhouette and pulmonary vasculature are within normal limits. IMPRESSION: Right basilar opacity suspicious for pneumonia. Electronically Signed   By: Miachel Roux M.D.   On: 05/01/2021 14:28    Procedures Procedures  Medications Ordered in ED Medications  aspirin chewable tablet 324 mg (324 mg Oral Given 05/01/21 1308)  LORazepam (ATIVAN) tablet 1 mg (1 mg Oral Given 05/01/21 1307)    ED Course  I have reviewed the triage vital  signs and the nursing notes.  Pertinent labs & imaging results that were available during my care of the patient were reviewed by me and considered in my medical decision making (see chart for details).    MDM Rules/Calculators/A&P                          Pt anxious and mildly hyperventilating on exam. VSS. Seems like she has a component of anxiety that is driving her symptoms, therefore gave ativan in addition to ASA. EKG shows sinus rhythm, no ischemic changes. Labs show blood glucose 252 with normal anion gap, WBC 11.1, negative D-dimer making PE very unlikely and low risk patient, negative initial troponin. CXR w/ R basilar opacity. She does not have significant URI symptoms however given mild leukocytosis and complaints of SOB, will cover w/ antibiotics.   Will obtain 2nd trop. Chart review shows visit to cardiology in Jan 2022 describing similar symptoms. Will have pt f/u with cardiology but if 2nd trop negative, I feel she is stable for d/c. Pt signed out to oncoming provider. Final Clinical Impression(s) / ED Diagnoses Final diagnoses:  Atypical chest pain    Rx / DC Orders ED Discharge Orders    None       Little, Wenda Overland, MD 05/01/21 [place], Wenda Overland, MD 05/01/21 1505

## 2021-05-01 NOTE — ED Notes (Signed)
Translation Tab used to interview client [facility] [phone])

## 2021-05-04 ENCOUNTER — Ambulatory Visit: Payer: Medicaid Other | Admitting: Family Medicine

## 2021-05-06 ENCOUNTER — Other Ambulatory Visit: Payer: Self-pay

## 2021-05-06 ENCOUNTER — Ambulatory Visit (INDEPENDENT_AMBULATORY_CARE_PROVIDER_SITE_OTHER): Payer: Medicaid Other | Admitting: Internal Medicine

## 2021-05-06 ENCOUNTER — Encounter: Payer: Self-pay | Admitting: Internal Medicine

## 2021-05-06 VITALS — BP 130/82 | HR 79 | Ht 61.0 in | Wt 201.0 lb

## 2021-05-06 DIAGNOSIS — R079 Chest pain, unspecified: Secondary | ICD-10-CM | POA: Diagnosis not present

## 2021-05-06 MED ORDER — AMLODIPINE BESYLATE 5 MG PO TABS: 5.0000 mg | ORAL_TABLET | Freq: Every day | ORAL | 3 refills | Status: DC

## 2021-05-06 NOTE — Patient Instructions (Addendum)
Medication Instructions:  Your physician has recommended you make the following change in your medication:  1.) stop isosorbide 2.) start amlodipine 5 mg - take one tablet by mouth once daily  *If you need a refill on your cardiac medications before your next appointment, please call your pharmacy*   Lab Work: none If you have labs (blood work) drawn today and your tests are completely normal, you will receive your results only by: Marland Kitchen MyChart Message (if you have MyChart) OR . A paper copy in the mail If you have any lab test that is abnormal or we need to change your treatment, we will call you to review the results.   Testing/Procedures: none   Follow-Up: At [facility], you and your health needs are our priority.  As part of our continuing mission to provide you with exceptional heart care, we have created designated Provider Care Teams.  These Care Teams include your primary Cardiologist (physician) and Advanced Practice Providers (APPs -  Physician Assistants and Nurse Practitioners) who all work together to provide you with the care you need, when you need it.    Your next appointment:   6 week(s)  The format for your next appointment:   In Person  Provider:   You may see Dorris Carnes, MD or one of the following Advanced Practice Providers on your designated Care Team:    Richardson Dopp, PA-C  Robbie Lis, [place]    Other Instructions Keep track of blood pressure at home.  Bring list of blood pressures and your cuff with you to next appointment.

## 2021-05-06 NOTE — Progress Notes (Signed)
Cardiology Office Note   Date:  05/06/2021   ID:  Debra Diaz 1976-05-17, MRN [id number]  PCP:  Carollee Leitz, MD  Cardiologist:   Dorris Carnes, MD   Pt presents for f/u of CP     History of Present Illness: Debra Diaz is a 45 y.o. 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 The pt was just seen in the ED for CP on 05/01/21.   She was sitting in church when she became dizzy, shaky, SOB    L sided chess pressure developed  BP had been running high   She was anxious on visit  Labs were negative except for mild leukocytoisis and ? Opacity on CXR   Rx abx  Sent home  SIne then she has had some chest pressure    Feels like she is sufficating at times    Not worsened by activity   Does note that BP has been elevated at times 150s/    Current Meds  Medication Sig  . Accu-Chek FastClix Lancets MISC USE TO CHECK BLOOD SUGAR UP TO FOUR TIMES DAILY  . aspirin EC 81 MG EC tablet Take 1 tablet (81 mg total) by mouth daily.  Marland Kitchen atorvastatin (LIPITOR) 40 MG tablet TAKE 1 TABLET(40 MG) BY MOUTH DAILY AT 6 PM  . benzonatate (TESSALON) 100 MG capsule Take 1 capsule (100 mg total) by mouth every 8 (eight) hours.  . blood glucose meter kit and supplies KIT Dispense based on patient and insurance preference. Use up to four times daily as directed. (FOR ICD-9 250.00, 250.01).  . blood glucose meter kit and supplies KIT Dispense based on patient and insurance preference. Use up to four times daily as directed. (FOR ICD-9 250.00, 250.01).  . Blood Pressure KIT Check blood pressure twice a day.  Dx code: I10  . doxycycline (VIBRAMYCIN) 100 MG capsule Take 1 capsule (100 mg total)  by mouth 2 (two) times daily.  . ferrous sulfate 325 (65 FE) MG EC tablet Take 1 tablet (325 mg total) by mouth in the morning, at noon, and at bedtime.  . furosemide (LASIX) 20 MG tablet Take 1 tablet (20 mg total) by mouth once a week. Take with potassium tablet  . glucose blood (ACCU-CHEK GUIDE) test strip USE TO TEST FOUR TIMES DAILY AS DIRECTED  . isosorbide mononitrate (IMDUR) 30 MG 24 hr tablet Take 1 tablet (30 mg total) by mouth daily.  Marland Kitchen lisinopril-hydrochlorothiazide (ZESTORETIC) 20-25 MG tablet Take 1 tablet by mouth daily.  . metFORMIN (GLUCOPHAGE) 500 MG tablet TAKE 1 TABLET(500 MG) BY MOUTH DAILY WITH BREAKFAST  . polyethylene glycol powder (GLYCOLAX/MIRALAX) 17 GM/SCOOP powder Start with one scoop daily, increase to two scoops daily as needed for soft bowel movement each day.  . potassium chloride (KLOR-CON) 10 MEQ tablet TAKE 1 TABLET BY MOUTH ONCE WEEKLY SAME DAY AS DAY AS FUROSEMIDE  . promethazine-dextromethorphan (PROMETHAZINE-DM) 6.25-15 MG/5ML syrup Take 5 mLs by mouth 4 (four) times daily as needed for cough.     Allergies:   Patient has no known  allergies.   Past Medical History:  Diagnosis Date  . Anemia   . Coronary artery disease   . Diabetes mellitus without complication ([place])   . Hypertension   . Morbid obesity ([place])   . 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 [place] and 2011   x 2  . LEFT HEART CATHETERIZATION WITH CORONARY ANGIOGRAM N/A 03/02/2015   Procedure: LEFT HEART CATHETERIZATION WITH CORONARY ANGIOGRAM;  Surgeon: [name], MD;  Location: [facility] CATH LAB;  Service: Cardiovascular;  Laterality: N/A;  . MINIMALLY INVASIVE EXCISION OF ATRIAL MYXOMA N/A 03/04/2015   Procedure: MINIMALLY INVASIVE RESECTION OF LEFT ATRIAL MYXOMA ( USING A BILAYER PATCH CLOSURE);  Surgeon: [name], MD;  Location: [place];  Service: Open Heart Surgery;  Laterality:  N/A;  . myxoma N/A    chest  . TEE WITHOUT CARDIOVERSION N/A 03/01/2015   Procedure: TRANSESOPHAGEAL ECHOCARDIOGRAM (TEE);  Surgeon: [name], MD;  Location: [facility] ENDOSCOPY;  Service: Cardiovascular;  Laterality: N/A;  . TEE WITHOUT CARDIOVERSION N/A 03/04/2015   Procedure: TRANSESOPHAGEAL ECHOCARDIOGRAM (TEE);  Surgeon: [name], MD;  Location: [place];  Service: Open Heart Surgery;  Laterality: N/A;  . TUBAL LIGATION  2011     Social History:  The patient  reports that she quit smoking about 3 years ago. She has never used smokeless tobacco. She reports current alcohol use. She reports that she does not use drugs.   Family History:  The patient's family history includes Asthma in her son; Breast cancer in her maternal grandmother; Diabetes in her father; Hypertension in her mother.    ROS:  Please see the history of present illness. All other systems are reviewed and  Negative to the above problem except as noted.    PHYSICAL EXAM: VS:  BP 130/82   Pulse 79   Ht _0  (1.549 m)   Wt 201 lb (91.2 kg)   SpO2 98%   BMI 37.98 kg/m   GEN:  Morbidly obese 45 yo in no acute distress  HEENT: normal  Neck: no JVD, carotid bruits Cardiac: RRR; no murmurs  No LE  edema  Respiratory:  clear to auscultation bilaterally GI: soft, nontender, nondistended, + BS  No hepatomegaly  MS: no deformity Moving all extremities   Skin: warm and dry, no rash Neuro:  Strength and sensation are intact Psych: euthymic mood, full affect   EKG:  EKG is not ordered today.  CARDIAC STUDIES  Relevant CV Studies: Nuclear stress test 09/13/16  Nuclear stress EF: 58%.  There was no ST segment deviation noted during stress.  The study is normal.  This is a low risk study.  The left ventricular ejection fraction is normal (55-65%).  Normal pharmacologic nuclear stress test with no evidence of prior infarct or ischemia.   Echo 01/09/2017 - Left ventricle: The cavity size was normal. Wall  thickness was  normal. Systolic function was normal. The estimated ejection  fraction was in the range of 55% to 60%. Wall motion was normal;  there were no regional wall motion abnormalities. Left  ventricular diastolic function parameters were normal.  - Left atrium: The atrium was normal in size.  - Tricuspid valve: There was trivial regurgitation.  - Pulmonary arteries: PA peak pressure: 28 mm Hg (S).  - Inferior vena cava: The vessel was normal in size. The  respirophasic diameter changes were in the  normal range (>= 50%),  consistent with normal central venous pressure.   LHC 02/2015  Dominance: Right  Left Main: Very Large caliber vessel that trifurcates into the LAD, Ramus Intermedius, and Left Circumflex. Angiographically normal  YYQ:MGNOI-[id number] vessel with a very proximal large caliber High First Diagonal Branch. The LAD has mild diffuse tender 20% lesions but is relatively atrophic normal as it courses down around the apex perfusing the distal third of the inferoapex.   D1:Large caliber high branch with several distal branches. Mild possible luminal irregularities of less than 30%.   D2:Moderate caliber vessel that arises from the mid LAD. It does not cover large distribution but is angiographically normal.  Left Circumflex:Large-caliber, nondominant vessel that courses mostly as a large lateral bifurcating OM branch. The inferior branch is much larger than the superior branch. The distal vessel is tortuous and reaches down almost of the inferoapex. Mild luminal irregularity. There is a very small AV groove branch..   Ramus intermedius:Large caliber vessel that courses is a high OM. It gives off a moderate sized branch from the mid vessel just after a eccentric tubular 30% lesion.. The daughter vessel and parent both reaches almost to the apex. They are somewhat tortuous, but relatively free of disease.   RCA: Large-caliber codominant vessel that  gives rise to a significant RV marginal branch in the mid vessel. It bifurcates distally into the Right Posterior Descending Artery (RPDA) and the Right Posterior AV Groove Branch (RPAV). Angiographically normal.   RPDA: Large caliber vessel that reaches two thirds the way to the apex. Angiographically normal.   RPL Sysytem:The RPAV begins as a moderate large vessel that terminates as a moderate caliber posterolateral branch. Angiographically normal. MYVUE   NOVANT AUg 2020  CATH Sept 2020 NOVANT  Findings:  1. Hemodynamics: Aortic pressure 125/75, LVEDP 12 2. Coronary system:  Left Main: Large caliber vessel with no angiographic evidence of  stenosis.  LAD system: Large caliber vessel, wraps around the apex. NO significant  stenosis  LCX system: Large caliber vessel. No significant stenosis.  RCA system: right dominant. No significant stenosis.  Conclusion: Normal coronaries CAD as mentioned above. Normal LVEDP  Recommendations: Continue risk factor modifications Lipid Panel    Component Value Date/Time   CHOL 165 02/11/2020 1548   TRIG 98 02/11/2020 1548   HDL 47 02/11/2020 1548   CHOLHDL 3.5 02/11/2020 1548   CHOLHDL 4.0 09/01/2016 1607   VLDL 42 (H) 09/01/2016 1607   LDLCALC 100 (H) 02/11/2020 1548      Wt Readings from Last 3 Encounters:  05/06/21 201 lb (91.2 kg)  05/01/21 201 lb (91.2 kg)  04/26/21 201 lb (91.2 kg)      ASSESSMENT AND PLAN:  1  Chst pain/ressure   Pt with normal coronary arteries at cath in the past    She was on amldoipine   Then switched to Imdur2    I would switch her back to amlodipine     She remains on Lasix one time per week  (LVEDP was 12 at cath )   Watch salt WIll follow up later this summer    At most would consider cardiopulmonary stress test. Watch salt  2  Hx of bradycardia   HR OK today    3  Hx myxoma   S/p resection of myxoma in 2016  Echo I 2018 with no recurrence   Will follow up with a repeat at some pt to  4   HTN   Continue lisinopril /  HCTZ   Stop imdur   Add amldipine  5   HL   On lipitor  Normal coronary arteries in 2020   LDL 100 in 2021    Current medicines are reviewed at length with the patient today.  The patient does not have concerns regarding medicines.  Signed, Dorris Carnes, MD  05/06/2021 4:24 PM    [facility] [place], [place], [place]  [address] Phone: [phone]; Fax: [phone]

## 2021-05-09 ENCOUNTER — Ambulatory Visit (INDEPENDENT_AMBULATORY_CARE_PROVIDER_SITE_OTHER): Payer: Medicaid Other | Admitting: Family Medicine

## 2021-05-09 ENCOUNTER — Encounter: Payer: Self-pay | Admitting: Family Medicine

## 2021-05-09 ENCOUNTER — Other Ambulatory Visit: Payer: Self-pay

## 2021-05-09 VITALS — BP 129/81 | HR 80 | Ht 61.0 in | Wt 202.0 lb

## 2021-05-09 DIAGNOSIS — I10 Essential (primary) hypertension: Secondary | ICD-10-CM | POA: Diagnosis not present

## 2021-05-09 DIAGNOSIS — Z789 Other specified health status: Secondary | ICD-10-CM

## 2021-05-09 NOTE — Patient Instructions (Addendum)
Hypertension Your blood pressure is well controlled.  . We will not make any medication changes today.  o Take the lasix just once a week  o Continue Lisinopril/HCTZ and amlodipine 5 mg  . If the top number is more than 140 and bottom number is more than 90 for multiple days, please call us to be seen sooner. . Please follow up in 3 months.   Hipertensin Su presin arterial est bien controlada. No haremos ningn cambio de medicacin hoy. Toma el lasix solo una vez a la semana. Continuar Lisinopril/HCTZ y amlodipino 5 mg Si el nmero superior es superior a 140 y el nmero inferior es superior a [address] llmenos para que lo [place] antes. Por favor, seguimiento en 3 meses.

## 2021-05-09 NOTE — Progress Notes (Signed)
    SUBJECTIVE:   CHIEF COMPLAINT / HPI:   Hypertension Patient reports Blood pressure at home: 137/81, pulse 47  (showed picture) Medicaitons include: lisinopril-HCTZ 20mg -25mg , amlodipine 5 mg, ASA 81  Lasix 20 mg (Rx #15) d/c. Patient doesn't know why she takes it. Reports cardiology rx'ed it for her.  Reports good compliance with medications. Denies side effects. She is following a Low Sodium diet. She is exercising. She does not smoke. Denies headache, dizziness, visual changes, nausea, vomiting, chest pain, LE swelling, abdominal pain or shortness of breath.   Spanish interpretor used for entirety of encounter.   PERTINENT  PMH / PSH: hx of open heart surgery (2016 - myxoma). Never MI. Admission in 2021 for CP, which was negative, but was found to have bradycardia.   OBJECTIVE:   BP 129/81   Pulse 80   Ht 5\' 1"  (1.549 m)   Wt 202 lb (91.6 kg)   LMP 04/01/2021   SpO2 96%   BMI 38.17 kg/m   General: NAD, non-toxic, well-appearing, sitting comfortably in chair. Accompanied by daughter today.    HEENT: [place]/AT. EOMI.  Cardiovascular: RRR, normal S1, S2. B/L 2+ RP. No BLEE Respiratory: CTAB. No IWOB.  Abdomen: + BS. NT, ND, soft to palpation.  Extremities: Warm and well perfused. Moving spontaneously.  Integumentary: No obvious rashes, lesions, trauma on general exam. Neuro: CN grossly intact. No FND  ASSESSMENT/PLAN:   HTN (hypertension) BP today is 129/81. Overall, no significant medication side effects noted and well controlled -Medications: Lisinopirl-HCTZ 20-25mg , amlodipine 5 mg.  No changes - patient follows with cardiology (last appt 05/06/21) - she reports seeing them once a year. She reports that they would like her to continue with HTN management here at [facility].  -Encouraged patient to monitor blood pressures at home, compliance with medications, and adherence to diet and exercise recommendations. -Follow-up in 3 months and bring medications and cuff (to confirm  measurements -- measurements seem higher at home than at office visits) to next appointment. Patient should return sooner if she has elevated BP >140/90 persistently.     Wilber Oliphant, MD [place]

## 2021-05-10 NOTE — Assessment & Plan Note (Addendum)
BP today is 129/81. Overall, no significant medication side effects noted and well controlled -Medications: Lisinopirl-HCTZ 20-25mg , amlodipine 5 mg.  No changes - patient follows with cardiology (last appt 05/06/21) - she reports seeing them once a year. She reports that they would like her to continue with HTN management here at [facility].  -Encouraged patient to monitor blood pressures at home, compliance with medications, and adherence to diet and exercise recommendations. -Discussed Maggi and Knorr seasoning avoidance due to high salt content  -Follow-up in 3 months and bring medications and cuff (to confirm measurements -- measurements seem higher at home than at office visits) to next appointment. Patient should return sooner if she has elevated BP >140/90 persistently.

## 2021-06-01 ENCOUNTER — Ambulatory Visit (HOSPITAL_COMMUNITY)
Admission: EM | Admit: 2021-06-01 | Discharge: 2021-06-01 | Disposition: A | Discharge status: Home / Self Care | Payer: Medicaid Other | Attending: Urgent Care | Admitting: Urgent Care

## 2021-06-01 ENCOUNTER — Emergency Department (HOSPITAL_COMMUNITY): Payer: Medicaid Other

## 2021-06-01 ENCOUNTER — Other Ambulatory Visit: Payer: Self-pay

## 2021-06-01 ENCOUNTER — Encounter (HOSPITAL_COMMUNITY): Payer: Self-pay

## 2021-06-01 ENCOUNTER — Emergency Department (HOSPITAL_COMMUNITY)
Admission: EM | Admit: 2021-06-01 | Discharge: 2021-06-01 | Disposition: A | Discharge status: Home / Self Care | Payer: Medicaid Other | Attending: Emergency Medicine | Admitting: Emergency Medicine

## 2021-06-01 DIAGNOSIS — I10 Essential (primary) hypertension: Secondary | ICD-10-CM | POA: Insufficient documentation

## 2021-06-01 DIAGNOSIS — R519 Headache, unspecified: Secondary | ICD-10-CM

## 2021-06-01 DIAGNOSIS — I2510 Atherosclerotic heart disease of native coronary artery without angina pectoris: Secondary | ICD-10-CM | POA: Diagnosis not present

## 2021-06-01 DIAGNOSIS — Z7982 Long term (current) use of aspirin: Secondary | ICD-10-CM | POA: Diagnosis not present

## 2021-06-01 DIAGNOSIS — Z87891 Personal history of nicotine dependence: Secondary | ICD-10-CM | POA: Diagnosis not present

## 2021-06-01 DIAGNOSIS — Z79899 Other long term (current) drug therapy: Secondary | ICD-10-CM | POA: Diagnosis not present

## 2021-06-01 DIAGNOSIS — I200 Unstable angina: Secondary | ICD-10-CM

## 2021-06-01 DIAGNOSIS — Z7984 Long term (current) use of oral hypoglycemic drugs: Secondary | ICD-10-CM | POA: Diagnosis not present

## 2021-06-01 DIAGNOSIS — E119 Type 2 diabetes mellitus without complications: Secondary | ICD-10-CM | POA: Insufficient documentation

## 2021-06-01 DIAGNOSIS — I25110 Atherosclerotic heart disease of native coronary artery with unstable angina pectoris: Secondary | ICD-10-CM

## 2021-06-01 DIAGNOSIS — R0789 Other chest pain: Secondary | ICD-10-CM | POA: Insufficient documentation

## 2021-06-01 DIAGNOSIS — E1165 Type 2 diabetes mellitus with hyperglycemia: Secondary | ICD-10-CM | POA: Diagnosis not present

## 2021-06-01 LAB — CBC
HCT: 40.6 % (ref 36.0–46.0)
Hemoglobin: 13.3 g/dL (ref 12.0–15.0)
MCH: 28.5 pg (ref 26.0–34.0)
MCHC: 32.8 g/dL (ref 30.0–36.0)
MCV: 86.9 fL (ref 80.0–100.0)
Platelets: 296 10*3/uL (ref 150–400)
RBC: 4.67 MIL/uL (ref 3.87–5.11)
RDW: 14.7 % (ref 11.5–15.5)
WBC: 11.2 10*3/uL — ABNORMAL HIGH (ref 4.0–10.5)
nRBC: 0 % (ref 0.0–0.2)

## 2021-06-01 LAB — BASIC METABOLIC PANEL
Anion gap: 7 (ref 5–15)
BUN: 8 mg/dL (ref 6–20)
CO2: 29 mmol/L (ref 22–32)
Calcium: 8.9 mg/dL (ref 8.9–10.3)
Chloride: 100 mmol/L (ref 98–111)
Creatinine, Ser: 0.57 mg/dL (ref 0.44–1.00)
GFR, Estimated: >60 mL/min (ref >60)
Glucose, Bld: 197 mg/dL — ABNORMAL HIGH (ref 70–99)
Potassium: 3.4 mmol/L — ABNORMAL LOW (ref 3.5–5.1)
Sodium: 136 mmol/L (ref 135–145)

## 2021-06-01 LAB — POCT URINALYSIS DIPSTICK, ED / UC
Bilirubin Urine: NEGATIVE
Glucose, UA: NEGATIVE mg/dL
Ketones, ur: NEGATIVE mg/dL
Leukocytes,Ua: NEGATIVE
Nitrite: NEGATIVE
Protein, ur: NEGATIVE mg/dL
Specific Gravity, Urine: 1.025 (ref 1.005–1.030)
Urobilinogen, UA: 0.2 mg/dL (ref 0.0–1.0)
pH: 7 (ref 5.0–8.0)

## 2021-06-01 LAB — CBG MONITORING, ED: Glucose-Capillary: 213 mg/dL — ABNORMAL HIGH (ref 70–99)

## 2021-06-01 LAB — TROPONIN I (HIGH SENSITIVITY)
Troponin I (High Sensitivity): 11 ng/L (ref <18)
Troponin I (High Sensitivity): 14 ng/L (ref <18)

## 2021-06-01 MED ORDER — ASPIRIN 81 MG PO CHEW: 243.0000 mg | CHEWABLE_TABLET | Freq: Once | ORAL | Status: DC

## 2021-06-01 MED ORDER — ACETAMINOPHEN 325 MG PO TABS
650.0000 mg | ORAL_TABLET | Freq: Once | ORAL | Status: AC
  Administered 2021-06-01: 650 mg via ORAL
  Filled 2021-06-01: qty 2

## 2021-06-01 MED ORDER — ASPIRIN 81 MG PO CHEW
324.0000 mg | CHEWABLE_TABLET | Freq: Once | ORAL | Status: AC
  Administered 2021-06-01: 324 mg via ORAL

## 2021-06-01 MED ORDER — SODIUM CHLORIDE 0.9 % IV BOLUS
500.0000 mL | Freq: Once | INTRAVENOUS | Status: AC
  Administered 2021-06-01: 500 mL via INTRAVENOUS

## 2021-06-01 MED ORDER — ASPIRIN 81 MG PO CHEW
CHEWABLE_TABLET | ORAL | Status: AC
  Filled 2021-06-01: qty 4

## 2021-06-01 NOTE — ED Triage Notes (Signed)
Pt reports high blood sugar (386 at home). Reports headache, dizziness starting at 9am. Reports blood glucose over 400 last night and high blood pressure last night (157/81).

## 2021-06-01 NOTE — ED Provider Notes (Signed)
[facility]   MRN: [id number] DOB: 1975/12/27  Subjective:   Debra Diaz is a 45 y.o. female with pmh of CAD, atrial flutter, elevated troponins, DM presenting for 1 day history of mid-sternal chest pain rated 6/10 that started last night, 8/10 frontal headache that started at 9am today. Has also felt very dizzy, nauseated, dry mouth. Checked her blood sugar and was 400+ last night. Has had history of TEE without cardioversion in 2016 left heart catheterization with coronary angiogram in 2016 as well.  Has a history of elevated troponins, atrial flutter, CAD.  Denies any history of stroke, MI.  No current facility-administered medications for this encounter.  Current Outpatient Medications:    amLODipine (NORVASC) 5 MG tablet, Take 1 tablet (5 mg total) by mouth daily., Disp: 90 tablet, Rfl: 3   aspirin EC 81 MG EC tablet, Take 1 tablet (81 mg total) by mouth daily., Disp: , Rfl:    ferrous sulfate 325 (65 FE) MG EC tablet, Take 1 tablet (325 mg total) by mouth in the morning, at noon, and at bedtime., Disp: 90 tablet, Rfl: 0   furosemide (LASIX) 20 MG tablet, Take 1 tablet (20 mg total) by mouth once a week. Take with potassium tablet, Disp: 15 tablet, Rfl: 3   lisinopril-hydrochlorothiazide (ZESTORETIC) 20-25 MG tablet, Take 1 tablet by mouth daily., Disp: 90 tablet, Rfl: 0   metFORMIN (GLUCOPHAGE) 500 MG tablet, TAKE 1 TABLET(500 MG) BY MOUTH DAILY WITH BREAKFAST, Disp: 90 tablet, Rfl: 3   polyethylene glycol powder (GLYCOLAX/MIRALAX) 17 GM/SCOOP powder, Start with one scoop daily, increase to two scoops daily as needed for soft bowel movement each day., Disp: 500 g, Rfl: 0   potassium chloride (KLOR-CON) 10 MEQ tablet, TAKE 1 TABLET BY MOUTH ONCE WEEKLY SAME DAY AS DAY AS FUROSEMIDE, Disp: 15 tablet, Rfl: 3   promethazine-dextromethorphan (PROMETHAZINE-DM) 6.25-15 MG/5ML syrup, Take 5 mLs by mouth 4 (four) times daily as needed for cough., Disp: 118 mL, Rfl:  0   atorvastatin (LIPITOR) 40 MG tablet, TAKE 1 TABLET(40 MG) BY MOUTH DAILY AT 6 PM, Disp: 90 tablet, Rfl: 3   benzonatate (TESSALON) 100 MG capsule, Take 1 capsule (100 mg total) by mouth every 8 (eight) hours., Disp: 21 capsule, Rfl: 0   blood glucose meter kit and supplies KIT, Dispense based on patient and insurance preference. Use up to four times daily as directed. (FOR ICD-9 250.00, 250.01)., Disp: 1 each, Rfl: 0   blood glucose meter kit and supplies KIT, Dispense based on patient and insurance preference. Use up to four times daily as directed. (FOR ICD-9 250.00, 250.01)., Disp: 1 each, Rfl: 0   Blood Pressure KIT, Check blood pressure twice a day.  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     Past Surgical History:  Procedure Laterality Date   CESAREAN SECTION  1999 and 2011   x 2   LEFT HEART  CATHETERIZATION WITH CORONARY ANGIOGRAM N/A 03/02/2015   Procedure: LEFT HEART CATHETERIZATION WITH CORONARY ANGIOGRAM;  Surgeon: [name], MD;  Location: [facility] CATH LAB;  Service: Cardiovascular;  Laterality: N/A;   MINIMALLY INVASIVE EXCISION OF ATRIAL MYXOMA N/A 03/04/2015   Procedure: MINIMALLY INVASIVE RESECTION OF LEFT ATRIAL MYXOMA ( USING A BILAYER PATCH CLOSURE);  Surgeon: [name], MD;  Location: [place];  Service: Open Heart Surgery;  Laterality: N/A;   myxoma N/A    chest   TEE WITHOUT CARDIOVERSION N/A 03/01/2015   Procedure: TRANSESOPHAGEAL ECHOCARDIOGRAM (TEE);  Surgeon: [name], MD;  Location: [facility] ENDOSCOPY;  Service: Cardiovascular;  Laterality: N/A;   TEE WITHOUT CARDIOVERSION N/A 03/04/2015   Procedure: TRANSESOPHAGEAL ECHOCARDIOGRAM (TEE);  Surgeon: [name], MD;  Location: [facility];  Service: Open Heart Surgery;  Laterality: N/A;   TUBAL LIGATION  2011    Family History  Problem Relation Age of Onset   Hypertension Mother    Heart disease Mother    Diabetes Father    Breast cancer Maternal Grandmother    Asthma Son     Social History   Tobacco Use   Smoking status: Former    Pack years: 0.00    Types: Cigarettes    Quit date: 11/17/2017    Years since quitting: 3.5   Smokeless tobacco: Never  Vaping Use   Vaping Use: Never used  Substance Use Topics   Alcohol use: Yes    Comment: occasional   Drug use: No    ROS   Objective:   Vitals: BP 131/69   Pulse (!) 56   Temp 98.9 F (37.2 C)   Resp 18   LMP 03/26/2021 Comment: reports had no period for 9 months, then had one april 9  SpO2 97%   Physical Exam Constitutional:      General: She is not in acute distress.    Appearance: Normal appearance. She is well-developed and normal weight. She is not ill-appearing, toxic-appearing or diaphoretic.  HENT:     Head: Normocephalic and atraumatic.     Right Ear: External ear normal.     Left Ear: External ear normal.     Nose: Nose normal.     Mouth/Throat:     Mouth: Mucous membranes are moist.     Pharynx: Oropharynx is clear.  Eyes:     General: No scleral icterus.    Extraocular Movements: Extraocular movements intact.     Pupils: Pupils are equal, round, and reactive to light.  Cardiovascular:     Rate and Rhythm: Normal rate and regular rhythm.     Pulses: Normal pulses.     Heart sounds: Normal heart sounds. No murmur heard.   No friction rub. No gallop.  Pulmonary:     Effort: Pulmonary effort is normal. No respiratory distress.     Breath sounds: Normal breath sounds. No stridor. No wheezing, rhonchi or rales.   Abdominal:     General: Bowel sounds are normal. There is no distension.     Palpations: Abdomen is soft. There is no mass.     Tenderness: There is no abdominal tenderness. There is no right CVA tenderness, left CVA tenderness, guarding or rebound.  Skin:    General: Skin is warm and dry.     Coloration: Skin is not pale.     Findings: No rash.  Neurological:     General: No focal deficit present.     Mental Status: She is alert  and oriented to person, place, and time.  Psychiatric:        Mood and Affect: Mood normal.        Behavior: Behavior normal.        Thought Content: Thought content normal.        Judgment: Judgment normal.    Results for orders placed or performed during the hospital encounter of 06/01/21 (from the past 24 hour(s))  POC CBG monitoring     Status: Abnormal   Collection Time: 06/01/21 12:44 PM  Result Value Ref Range   Glucose-Capillary 213 (H) 70 - 99 mg/dL  POC Urinalysis dipstick     Status: Abnormal   Collection Time: 06/01/21 12:47 PM  Result Value Ref Range   Glucose, UA NEGATIVE NEGATIVE mg/dL   Bilirubin Urine NEGATIVE NEGATIVE   Ketones, ur NEGATIVE NEGATIVE mg/dL   Specific Gravity, Urine 1.025 1.005 - 1.030   Hgb urine dipstick SMALL (A) NEGATIVE   pH 7.0 5.0 - 8.0   Protein, ur NEGATIVE NEGATIVE mg/dL   Urobilinogen, UA 0.2 0.0 - 1.0 mg/dL   Nitrite NEGATIVE NEGATIVE   Leukocytes,Ua NEGATIVE NEGATIVE   ED ECG REPORT   Date: 06/01/2021  Rate: 61bpm  Rhythm: normal sinus rhythm  QRS Axis: normal  Intervals: normal  ST/T Wave abnormalities: normal  Conduction Disutrbances:none  Narrative Interpretation: Sinus rhythm at 61 bpm with no acute findings, comparable to previous EKG except for atrial flutter.  Old EKG Reviewed: unchanged  I have personally reviewed the EKG tracing and agree with the computerized printout as noted.   Assessment and Plan :   PDMP not reviewed this encounter.  1. Unstable angina ([place])   2. Frontal  headache   3. Coronary artery disease involving native heart with unstable angina pectoris, unspecified vessel or lesion type ([place])   4. Uncontrolled type 2 diabetes mellitus with hyperglycemia ([place])     Patient has significant cardiac risk factors and presents with unstable angina, severe frontal headache.  EKG without acute findings.  Vital signs stable but due to her high risk factors recommended ER visit with transport by EMS.  She was given 324 mg chewable aspirin prior to EMS arrival.  Case reported out to EMS, transfer of care completed for transport to [facility] for rule out of stroke, ACS.   Jaynee Eagles, PA-C 06/01/21 1337

## 2021-06-01 NOTE — ED Notes (Signed)
EMS en route for pt.

## 2021-06-01 NOTE — Discharge Instructions (Addendum)
Recheck with your primary care and cardiologist.  Return to the emergency room for new or worsening symptoms.

## 2021-06-01 NOTE — ED Notes (Signed)
Patient is being discharged from the Urgent Care and sent to the Emergency Department via EMS. Per [place] PA, patient is in need of higher level of care due to chest pain and dizziness. Patient is aware and verbalizes understanding of plan of care.  Vitals:   06/01/21 1234  BP: 131/69  Pulse: (!) 56  Resp: 18  Temp: 98.9 F (37.2 C)  SpO2: 97%

## 2021-06-01 NOTE — ED Provider Notes (Signed)
[facility] EMERGENCY DEPARTMENT Provider Note   CSN: [id number] Arrival date & time: 06/01/21  1353     History Chief Complaint  Patient presents with   Chest Pain    Debra Diaz is a 45 y.o. female.  46 year old female with past medical history of atrial myxoma (removed surgically), hypertension, diabetes, coronary artery disease, obesity, [place] with complaint of midsternal chest heaviness which radiates to her back onset yesterday, has been intermittent since that time, associated with headache.  Denies any nausea, shortness of breath, diaphoresis.  Patient was given aspirin and nitro sublingual by EMS without improvement in her symptoms.      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    Patient Active Problem List   Diagnosis Date Noted   Abdominal discomfort 02/18/2021   Amenorrhea 02/18/2021   Asymptomatic microscopic hematuria 10/22/2020   Bradycardia 02/20/2020   Chest pain 02/15/2020   Possible exposure to STD 02/11/2020   Abnormal uterine bleeding 02/11/2020   Genital warts 02/11/2020   Constipation 01/09/2018   Breast pain, right 10/03/2017   Irregular menses 07/02/2017   Leukocytosis 10/03/2016   Anxiety reaction 08/15/2016   Healthcare maintenance 06/12/2016   Concussion 05/05/2016   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   Morbid obesity ([place])    Chest pain with high risk of acute coronary syndrome 02/27/2015   Nonintractable episodic headache 02/26/2015   Elevated troponin I level 02/26/2015   HTN (hypertension) 02/26/2015   DM2 (diabetes mellitus, type 2) ([place]) 02/26/2015    Past Surgical History:  Procedure Laterality Date   CESAREAN SECTION  1999 and 2011   x 2   LEFT HEART CATHETERIZATION WITH  CORONARY ANGIOGRAM N/A 03/02/2015   Procedure: LEFT HEART CATHETERIZATION WITH CORONARY ANGIOGRAM;  Surgeon: [name], MD;  Location: [facility] CATH LAB;  Service: Cardiovascular;  Laterality: N/A;   MINIMALLY INVASIVE EXCISION OF ATRIAL MYXOMA N/A 03/04/2015   Procedure: MINIMALLY INVASIVE RESECTION OF LEFT ATRIAL MYXOMA ( USING A BILAYER PATCH CLOSURE);  Surgeon: [name], MD;  Location: [place];  Service: Open Heart Surgery;  Laterality: N/A;   myxoma N/A    chest   TEE WITHOUT CARDIOVERSION N/A 03/01/2015   Procedure: TRANSESOPHAGEAL ECHOCARDIOGRAM (TEE);  Surgeon: [name], MD;  Location: [facility] ENDOSCOPY;  Service: Cardiovascular;  Laterality: N/A;   TEE WITHOUT CARDIOVERSION N/A 03/04/2015   Procedure: TRANSESOPHAGEAL ECHOCARDIOGRAM (TEE);  Surgeon: [name], MD;  Location: [place];  Service: Open Heart Surgery;  Laterality: N/A;   TUBAL LIGATION  2011     OB History     Gravida  6   Para  5   Term  5   Preterm      AB  1   Living         SAB  1   IAB      Ectopic      Multiple      Live Births              Family History  Problem Relation Age of Onset   Hypertension Mother    Heart disease Mother    Diabetes Father    Breast cancer Maternal Grandmother    Asthma Son  Social History   Tobacco Use   Smoking status: Former    Pack years: 0.00    Types: Cigarettes    Quit date: 11/17/2017    Years since quitting: 3.5   Smokeless tobacco: Never  Vaping Use   Vaping Use: Never used  Substance Use Topics   Alcohol use: Yes    Comment: occasional   Drug use: No    Home Medications Prior to Admission medications   Medication Sig Start Date End Date Taking? Authorizing Provider  amLODipine (NORVASC) 5 MG tablet Take 1 tablet (5 mg total) by mouth daily. 05/06/21  Yes Fay Records, MD  aspirin EC 81 MG EC tablet Take 1 tablet (81 mg total) by mouth daily. 02/17/20  Yes Meccariello, Bernita Raisin, DO  atorvastatin (LIPITOR) 40 MG tablet TAKE  1 TABLET(40 MG) BY MOUTH DAILY AT 6 PM Patient taking differently: Take 40 mg by mouth every evening. 02/16/20  Yes Winfrey, Alcario Drought, MD  blood glucose meter kit and supplies KIT Dispense based on patient and insurance preference. Use up to four times daily as directed. (FOR ICD-9 250.00, 250.01). 07/14/20  Yes Carollee Leitz, MD  blood glucose meter kit and supplies KIT Dispense based on patient and insurance preference. Use up to four times daily as directed. (FOR ICD-9 250.00, 250.01). 10/21/20  Yes Carollee Leitz, MD  Blood Pressure KIT Check blood pressure twice a day.  Dx code: I10 11/18/20  Yes Carollee Leitz, MD  ferrous sulfate 325 (65 FE) MG EC tablet Take 1 tablet (325 mg total) by mouth in the morning, at noon, and at bedtime. 04/26/21  Yes Simmons-Robinson, Makiera, MD  furosemide (LASIX) 20 MG tablet Take 1 tablet (20 mg total) by mouth once a week. Take with potassium tablet Patient taking differently: Take 20 mg by mouth once a week. Take with potassium tablet on Tuesdays. 04/26/21  Yes Simmons-Robinson, Makiera, MD  glucose blood (ACCU-CHEK GUIDE) test strip USE TO TEST FOUR TIMES DAILY AS DIRECTED 01/24/21  Yes Carollee Leitz, MD  lisinopril-hydrochlorothiazide (ZESTORETIC) 20-25 MG tablet Take 1 tablet by mouth daily. 04/26/21  Yes Simmons-Robinson, Makiera, MD  metFORMIN (GLUCOPHAGE) 500 MG tablet TAKE 1 TABLET(500 MG) BY MOUTH DAILY WITH BREAKFAST Patient taking differently: Take 500 mg by mouth daily with breakfast. 10/21/20  Yes Carollee Leitz, MD  polyethylene glycol powder (GLYCOLAX/MIRALAX) 17 GM/SCOOP powder Start with one scoop daily, increase to two scoops daily as needed for soft bowel movement each day. 02/18/21  Yes Welborn, Ryan, DO  potassium chloride (KLOR-CON) 10 MEQ tablet TAKE 1 TABLET BY MOUTH ONCE WEEKLY SAME DAY AS DAY AS FUROSEMIDE Patient taking differently: Take 10 mEq by mouth once a week. Take with furosemide tablet on Tuesdays. 01/24/21  Yes Imogene Burn, PA-C  Vitamin D,  Cholecalciferol, 25 MCG (1000 UT) TABS Take 1,000 Units by mouth daily. 04/22/21  Yes [provider]  benzonatate (TESSALON) 100 MG capsule Take 1 capsule (100 mg total) by mouth every 8 (eight) hours. Patient not taking: Reported on 06/01/2021 03/21/21   Hazel Sams, PA-C  promethazine-dextromethorphan (PROMETHAZINE-DM) 6.25-15 MG/5ML syrup Take 5 mLs by mouth 4 (four) times daily as needed for cough. Patient not taking: Reported on 06/01/2021 03/21/21   Hazel Sams, PA-C    Allergies    Patient has no known allergies.  Review of Systems   Review of Systems  Constitutional:  Negative for chills, diaphoresis and fever.  Respiratory:  Negative for shortness of breath.  Cardiovascular:  Positive for chest pain. Negative for palpitations and leg swelling.  Gastrointestinal:  Negative for abdominal pain, nausea and vomiting.  Genitourinary:  Negative for dysuria.  Musculoskeletal:  Positive for back pain. Negative for myalgias, neck pain and neck stiffness.  Skin:  Negative for rash and wound.  Allergic/Immunologic: Positive for immunocompromised state.  Neurological:  Negative for weakness.  Hematological:  Negative for adenopathy.  All other systems reviewed and are negative.  Physical Exam Updated Vital Signs BP 110/70   Pulse (!) 56   Temp 98.6 F (37 C) (Oral)   Resp 17   Ht 5' 2"  (1.575 m)   Wt 92.1 kg   SpO2 99%   BMI 37.13 kg/m   Physical Exam Vitals and nursing note reviewed.  Constitutional:      General: She is not in acute distress.    Appearance: She is well-developed. She is not diaphoretic.  HENT:     Head: Normocephalic and atraumatic.  Cardiovascular:     Rate and Rhythm: Normal rate and regular rhythm.     Heart sounds: Normal heart sounds. No murmur heard. Pulmonary:     Effort: Pulmonary effort is normal.     Breath sounds: Normal breath sounds.  Chest:     Chest wall: No tenderness.  Abdominal:     Palpations: Abdomen is soft.      Tenderness: There is no abdominal tenderness.  Musculoskeletal:     Right lower leg: No tenderness. No edema.     Left lower leg: No edema.  Skin:    General: Skin is warm and dry.     Findings: No erythema.  Neurological:     Mental Status: She is alert and oriented to person, place, and time.  Psychiatric:        Behavior: Behavior normal.    ED Results / Procedures / Treatments   Labs (all labs ordered are listed, but only abnormal results are displayed) Labs Reviewed  BASIC METABOLIC PANEL - Abnormal; Notable for the following components:      Result Value   Potassium 3.4 (*)    Glucose, Bld 197 (*)    All other components within normal limits  CBC - Abnormal; Notable for the following components:   WBC 11.2 (*)    All other components within normal limits  TROPONIN I (HIGH SENSITIVITY)  TROPONIN I (HIGH SENSITIVITY)    EKG EKG Interpretation  Date/Time:  Wednesday June 01 2021 13:54:10 EDT Ventricular Rate:  61 PR Interval:  141 QRS Duration: 80 QT Interval:  400 QTC Calculation: 403 R Axis:   12 Text Interpretation: Sinus rhythm no sig change Confirmed by Charlesetta Shanks [phone]) on 06/01/2021 4:30:22 PM  Radiology DG Chest Port 1 View  Result Date: 06/01/2021 CLINICAL DATA:  Chest pain EXAM: PORTABLE CHEST 1 VIEW COMPARISON:  May 01, 2021 FINDINGS: The lungs are clear. Heart is upper normal in size with pulmonary vascular normal. No adenopathy. No pneumothorax. No bone lesions. IMPRESSION: No edema or airspace opacity.  Heart upper normal in size. Electronically Signed   By: Lowella Grip III M.D.   On: 06/01/2021 14:41    Procedures Procedures   Medications Ordered in ED Medications  sodium chloride 0.9 % bolus 500 mL (0 mLs Intravenous Stopped 06/01/21 1640)  acetaminophen (TYLENOL) tablet 650 mg (650 mg Oral Given 06/01/21 1640)    ED Course  I have reviewed the triage vital signs and the nursing notes.  Pertinent labs & imaging results  that were  available during my care of the patient were reviewed by me and considered in my medical decision making (see chart for details).  Clinical Course as of 06/01/21 1851  Wed Jun 15, 44  783 45 year old female with complaint of chest pain onset yesterday, intermittent.  No improvement with aspirin and nitro.  Exam is unremarkable.  EKG without acute ischemic changes.  Labs reassuring including CBC and BMP, troponin is unchanged at 11 and 14.  Plan is for patient to recheck with her cardiologist and PCP, return to ED for new or worsening symptoms. [LM]    Clinical Course User Index [LM] Roque Lias   MDM Rules/Calculators/A&P                           Final Clinical Impression(s) / ED Diagnoses Final diagnoses:  Atypical chest pain    Rx / DC Orders ED Discharge Orders     None        Roque Lias 06/01/21 Yevette Edwards    Charlesetta Shanks, MD 06/12/21 1234

## 2021-06-01 NOTE — ED Notes (Signed)
Called report to ED charge.

## 2021-06-01 NOTE — ED Triage Notes (Signed)
PT BIB [facility] from [facility] for chest pain d/t hx of aflutter. CP started last night at 7pm, headache this am at 9am. Pt reported CP with pressure and radiation to back and worse with deep breathing. Nitro administered by EMS with no relief. 324mg  of aspirin, 0.4mg  nitro x1

## 2021-06-02 ENCOUNTER — Telehealth: Payer: Self-pay

## 2021-06-02 NOTE — Telephone Encounter (Signed)
Patient calls nurse line requesting to schedule appointment for elevated blood sugar readings. Patient reports blood sugar reading in the upper 200s over the last few days. At this time, patient is currently asymptomatic.   Scheduled with PCP for tomorrow afternoon.   ED precautions given. Patient verbalizes understanding.   Talbot Grumbling, RN

## 2021-06-03 ENCOUNTER — Ambulatory Visit (INDEPENDENT_AMBULATORY_CARE_PROVIDER_SITE_OTHER): Payer: Medicaid Other | Admitting: Family Medicine

## 2021-06-03 ENCOUNTER — Encounter: Payer: Self-pay | Admitting: Family Medicine

## 2021-06-03 ENCOUNTER — Other Ambulatory Visit: Payer: Self-pay

## 2021-06-03 VITALS — BP 124/66 | HR 82 | Wt 204.4 lb

## 2021-06-03 DIAGNOSIS — I10 Essential (primary) hypertension: Secondary | ICD-10-CM | POA: Diagnosis not present

## 2021-06-03 DIAGNOSIS — E1159 Type 2 diabetes mellitus with other circulatory complications: Secondary | ICD-10-CM | POA: Diagnosis present

## 2021-06-03 DIAGNOSIS — Z1211 Encounter for screening for malignant neoplasm of colon: Secondary | ICD-10-CM

## 2021-06-03 DIAGNOSIS — E785 Hyperlipidemia, unspecified: Secondary | ICD-10-CM | POA: Diagnosis not present

## 2021-06-03 DIAGNOSIS — E1169 Type 2 diabetes mellitus with other specified complication: Secondary | ICD-10-CM

## 2021-06-03 LAB — POCT GLYCOSYLATED HEMOGLOBIN (HGB A1C): HbA1c, POC (controlled diabetic range): 8.1 % — AB (ref 0.0–7.0)

## 2021-06-03 MED ORDER — BLOOD GLUCOSE MONITOR KIT: PACK | 0 refills | Status: DC

## 2021-06-03 MED ORDER — ATORVASTATIN CALCIUM 40 MG PO TABS: 40.0000 mg | ORAL_TABLET | Freq: Every day | ORAL | 3 refills | Status: DC

## 2021-06-03 MED ORDER — ACCU-CHEK GUIDE VI STRP: ORAL_STRIP | 1 refills | Status: DC

## 2021-06-03 MED ORDER — METFORMIN HCL ER 500 MG PO TB24: 500.0000 mg | ORAL_TABLET | Freq: Two times a day (BID) | ORAL | 3 refills | Status: DC

## 2021-06-03 NOTE — Progress Notes (Signed)
    SUBJECTIVE:   CHIEF COMPLAINT / HPI: elevated blood sugars   Diabetes Mellitus  Eats 2 meals a day and 0 snacks. Diet- CHOs Tortillas, beans and rice. Denies missing any doses of DM medications and takes Metformin 500 mg daily . Endorses increased thirst, hunger, and frequent urination. Intermittent blurry vision.   Denies any weight loss or weight gain. Checks glucose with glucometer once a day and has been as high as 400.   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 Abdomen soft and non-tender.  Extremities: No peripheral edema.  Diabetic foot exam: No deformities, ulcerations, or other skin breakdown on feet bilaterally.  Sensation intact to monofilament and light touch.  PT and DP pulses intact BL.    ASSESSMENT/PLAN:   DM2 (diabetes mellitus, type 2) (HCC) Last A1c 5.9.  Today's A1c 8.1.  Recent labs 06/01/21 shows elevated serum glucose 197. Increase Metformin 500 mg BID Encourage diet and exercise Refer to Nutritionist, patient will call for appointment Encouraged to make an appointment for eye exam Strict return precautions for blood glucose >400 or worsening symptomd Follow up in 3 months or sooner if symptoms worsen.    HTN (hypertension) Well controlled and at goal.  Recent labs show  Cr wnl.K 3.4.  Takes Lasix weekly with Potassium supplement.  Ordered by cardiology. Continue Amlodipine 5 mg daily Continue Zestoretic 20-25 mg daily Follow up 3 months or sooner if BP>140/90  Hyperlipidemia associated with type 2 diabetes mellitus (HCC) Last LDL 100.  Goal LDL< 70 Compliant with Lipitor 40 mg daily Repeat Lipid panel today, if  remains elevated will need adjustment in medications Follow up with lab results    Health Maintenance Colonoscopy ordered today PAP completed 2021, NILN, HPV neg.  due 2026  Mammogram  yearly due 10/22 Hep C screening 07/21 HIV screening 02/21 COVID Vaccine x1, denied second A1c due today Statin- Lipitor 40 mg   Carollee Leitz, MD [place]

## 2021-06-03 NOTE — Patient Instructions (Signed)
Thank you for coming to see me today. It was a pleasure.    We will get some labs today.  If they are abnormal or we need to do something about them, I will call you.  If they are normal, I will send you a message on MyChart (if it is active) or a letter in the mail.  If you don't hear from [place] in 2 weeks, please call the office at the number below.   You should pay attention to your hemoglobin A1C.  It is a three month test about your average blood sugar. If the A1C is - <7.0 is great.  That is our goal for treating you. - Between 7.0 and 9.0 is not so good.  We would need to work to do better. - Above 9.0 is terrible.  You would really need to work with [place] to get it under control.    Your last A1c was 5.9 in March. Today's A1C = 8.1   Please make an appointment with Dr Iver Nestle to discuss healthy nutrition to help with your diabetes control.   You can call [phone]  Increase Metformin to 500 mg twice a day Continue ASA 81 mg daily Continue Lipitor 40 mg daily  Your Blood pressure today is 124/66 Continue Amlodipine 5 mg daily Continue Zestoretic 20/25 mg daily  You are due for an eye exam.  Please make sure that you call your eye doctor to have this scheduled and have them fax the results to our office.   You are due for a colonoscopy.  Please use the form that we have given you to schedule this at your convenience.   You are due for an eye exam.  Please make sure that you call your eye doctor to have this scheduled and have them fax the results to our office.   Please follow-up with PCP in 3 months  If you have any questions or concerns, please do not hesitate to call the office at [phone].  Best,   Carollee Leitz, MD     Diet Recommendations for Diabetes   Starchy (carb) foods: Bread, rice, pasta, potatoes, corn, cereal, grits, crackers, bagels, muffins, all baked goods.  (Fruits, milk, and yogurt also have carbohydrate, but most of these foods will not spike  your blood sugar as most starchy foods will.)  A few fruits do cause high blood sugars; use small portions of bananas (limit to 1/2 at a time), grapes, watermelon, oranges, and most tropical fruits.    Protein foods: Meat, fish, poultry, eggs, dairy foods, and beans such as pinto and kidney beans (beans also provide carbohydrate).   1. Eat at least 3 meals and 1-2 snacks per day. Never go more than 4-5 hours while awake without eating. Eat breakfast within the first hour of getting up.   2. Limit starchy foods to TWO per meal and ONE per snack. ONE portion of a starchy  food is equal to the following:   - ONE slice of bread (or its equivalent, such as half of a hamburger bun).   - 1/2 cup of a "scoopable" starchy food such as potatoes or rice.   - 15 grams of Total Carbohydrate as shown on food label.  3. Include at every meal: a protein food, a carb food, and vegetables and/or fruit.   - Obtain twice the volume of veg's as protein or carbohydrate foods for both lunch and dinner.   - Fresh or frozen veg's are best.   -  Keep frozen veg's on hand for a quick vegetable serving.        Optometrists who accept Medicaid   Accepts Medicaid for Eye Exam and [place] [address] Phone: [phone]  Open Monday- Saturday from 9 AM to 5 PM Ages 6 months and older Se habla Espaol [facility] at [facility] [place] Phone: [phone] Open Monday -Friday (by appointment only) Ages 90 and older No se habla Espaol   [facility] at [facility] [place], [place] Phone: [phone] Open Monday-Saturday Ages 43 years and older Se habla Espaol  [facility] [place] [phone] [address] [place]lean [place], [place]  Phone: [phone] Open Monday-Friday Ages 5 years and older  [place]. Phone: [phone] Open Monday-Friday Ages  41 and older No se habla Espaol  [facility] [place] [address] Phone: [phone] Age 39 year old and older Open [place] at [facility] [place] Phone: [phone] Open Monday-Friday Ages 35 and older No se habla Espaol         Accepts Medicaid for Eye Exam only (will have to pay for glasses)  [place] Phone: [phone] Open 7 days per week Ages 5 and older (must know alphabet) No se [place]  Phone: [phone] Open 7 days per week Ages 28 and older (must know alphabet) No se habla [place], [address] Phone: [phone] Open Monday-Saturday Ages 6 years and older [place] [address] [place] Phone: [phone] Open 7 days per week Ages 5 and older (must know alphabet) No se habla Espaol

## 2021-06-03 NOTE — Assessment & Plan Note (Addendum)
Last A1c 5.9.  Today's A1c 8.1.  Recent labs 06/01/21 shows elevated serum glucose 197. Increase Metformin 500 mg BID Encourage diet and exercise Refer to Nutritionist, patient will call for appointment Encouraged to make an appointment for eye exam Strict return precautions for blood glucose >400 or worsening symptomd Follow up in 3 months or sooner if symptoms worsen.

## 2021-06-03 NOTE — Assessment & Plan Note (Addendum)
Well controlled and at goal.  Recent labs show  Cr wnl.K 3.4.  Takes Lasix weekly with Potassium supplement.  Ordered by cardiology. Continue Amlodipine 5 mg daily Continue Zestoretic 20-25 mg daily Follow up 3 months or sooner if BP>140/90

## 2021-06-03 NOTE — Addendum Note (Signed)
Addended by: Lanice Shirts on: 06/03/2021 06:27 PM   Modules accepted: Orders

## 2021-06-03 NOTE — Assessment & Plan Note (Signed)
Last LDL 100.  Goal LDL< 70 Compliant with Lipitor 40 mg daily Repeat Lipid panel today, if remains elevated will need adjustment in medications Follow up with lab results

## 2021-06-04 LAB — LIPID PANEL
Chol/HDL Ratio: 5 ratio — ABNORMAL HIGH (ref 0.0–4.4)
Cholesterol, Total: 161 mg/dL (ref 100–199)
HDL: 32 mg/dL — ABNORMAL LOW (ref >39)
LDL Chol Calc (NIH): 98 mg/dL (ref 0–99)
Triglycerides: 180 mg/dL — ABNORMAL HIGH (ref 0–149)
VLDL Cholesterol Cal: 31 mg/dL (ref 5–40)

## 2021-07-18 ENCOUNTER — Ambulatory Visit (HOSPITAL_COMMUNITY)
Admission: EM | Admit: 2021-07-18 | Discharge: 2021-07-18 | Disposition: A | Discharge status: Home / Self Care | Payer: Medicaid Other | Attending: Internal Medicine | Admitting: Internal Medicine

## 2021-07-18 ENCOUNTER — Other Ambulatory Visit: Payer: Self-pay

## 2021-07-18 ENCOUNTER — Encounter (HOSPITAL_COMMUNITY): Payer: Self-pay | Admitting: *Deleted

## 2021-07-18 DIAGNOSIS — R519 Headache, unspecified: Secondary | ICD-10-CM

## 2021-07-18 DIAGNOSIS — H1131 Conjunctival hemorrhage, right eye: Secondary | ICD-10-CM

## 2021-07-18 MED ORDER — ERYTHROMYCIN 5 MG/GM OP OINT: TOPICAL_OINTMENT | OPHTHALMIC | 0 refills | Status: DC

## 2021-07-18 NOTE — ED Provider Notes (Signed)
[facility]    CSN: [id number] Arrival date & time: 07/18/21  1342      History   Chief Complaint Chief Complaint  Patient presents with   Headache   red eye Rt    HPI Debra Diaz is a 45 y.o. female.   Patient presenting today with right lateral eye redness since yesterday.  Also has been having a dull headache in the right forehead and upper region.  Denies nausea, vomiting, fevers, chills, head injury, foreign body into eye, itching, visual changes.  Not trying anything over-the-counter for symptoms thus far.  No past history of stomach issues.    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    Patient Active Problem List   Diagnosis Date Noted   Hyperlipidemia associated with type 2 diabetes mellitus ([place]) 06/03/2021   Abdominal discomfort 02/18/2021   Amenorrhea 02/18/2021   Asymptomatic microscopic hematuria 10/22/2020   Bradycardia 02/20/2020   Chest pain 02/15/2020   Possible exposure to STD 02/11/2020   Abnormal uterine bleeding 02/11/2020   Genital warts 02/11/2020   Constipation 01/09/2018   Breast pain, right 10/03/2017   Irregular menses 07/02/2017   Leukocytosis 10/03/2016   Anxiety reaction 08/15/2016   Healthcare maintenance 06/12/2016   Concussion 05/05/2016   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   Morbid obesity ([place])    Chest pain with high risk of acute coronary syndrome 02/27/2015   Nonintractable episodic headache 02/26/2015   Elevated troponin I level 02/26/2015   HTN (hypertension) 02/26/2015   DM2 (diabetes mellitus, type 2) ([place]) 02/26/2015    Past Surgical History:  Procedure Laterality Date   CESAREAN SECTION  1999 and 2011   x 2   LEFT HEART CATHETERIZATION WITH CORONARY ANGIOGRAM N/A  03/02/2015   Procedure: LEFT HEART CATHETERIZATION WITH CORONARY ANGIOGRAM;  Surgeon: [name], MD;  Location: [facility] CATH LAB;  Service: Cardiovascular;  Laterality: N/A;   MINIMALLY INVASIVE EXCISION OF ATRIAL MYXOMA N/A 03/04/2015   Procedure: MINIMALLY INVASIVE RESECTION OF LEFT ATRIAL MYXOMA ( USING A BILAYER PATCH CLOSURE);  Surgeon: [name], MD;  Location: [facility];  Service: Open Heart Surgery;  Laterality: N/A;   myxoma N/A    chest   TEE WITHOUT CARDIOVERSION N/A 03/01/2015   Procedure: TRANSESOPHAGEAL ECHOCARDIOGRAM (TEE);  Surgeon: [name], MD;  Location: [facility] ENDOSCOPY;  Service: Cardiovascular;  Laterality: N/A;   TEE WITHOUT CARDIOVERSION N/A 03/04/2015   Procedure: TRANSESOPHAGEAL ECHOCARDIOGRAM (TEE);  Surgeon: [name], MD;  Location: [facility];  Service: Open Heart Surgery;  Laterality: N/A;   TUBAL LIGATION  2011    OB History     Gravida  6   Para  5   Term  5   Preterm      AB  1   Living         SAB  1   IAB      Ectopic      Multiple      Live Births               Home Medications    Prior to Admission medications   Medication Sig Start Date End Date Taking? Authorizing Provider  amLODipine (NORVASC) 5 MG tablet Take 1 tablet (  5 mg total) by mouth daily. 05/06/21  Yes Fay Records, MD  aspirin EC 81 MG EC tablet Take 1 tablet (81 mg total) by mouth daily. 02/17/20  Yes Meccariello, Bernita Raisin, DO  atorvastatin (LIPITOR) 40 MG tablet Take 1 tablet (40 mg total) by mouth daily. TAKE 1 TABLET(40 MG) BY MOUTH DAILY AT 6 PM 06/03/21  Yes Carollee Leitz, MD  erythromycin ophthalmic ointment Place a 1/2 inch ribbon of ointment into the right lower eyelid twice daily as needed. 07/18/21  Yes Volney American, PA-C  ferrous sulfate 325 (65 FE) MG EC tablet Take 1 tablet (325 mg total) by mouth in the morning, at noon, and at bedtime. 04/26/21  Yes Simmons-Robinson, Makiera, MD  lisinopril-hydrochlorothiazide (ZESTORETIC) 20-25 MG tablet  Take 1 tablet by mouth daily. 04/26/21  Yes Simmons-Robinson, Makiera, MD  metFORMIN (GLUCOPHAGE-XR) 500 MG 24 hr tablet Take 1 tablet (500 mg total) by mouth in the morning and at bedtime. 06/03/21  Yes Carollee Leitz, MD  polyethylene glycol powder [facility] 17 GM/SCOOP powder Start with one scoop daily, increase to two scoops daily as needed for soft bowel movement each day. 02/18/21  Yes Welborn, Ryan, DO  benzonatate (TESSALON) 100 MG capsule Take 1 capsule (100 mg total) by mouth every 8 (eight) hours. Patient not taking: Reported on 06/01/2021 03/21/21   Hazel Sams, PA-C  blood glucose meter kit and supplies KIT Dispense based on patient and insurance preference. Use up to four times daily as directed. (FOR ICD-9 250.00, 250.01). 06/03/21   Carollee Leitz, MD  Blood Pressure KIT Check blood pressure twice a day.  Dx code: I10 11/18/20   Carollee Leitz, MD  furosemide (LASIX) 20 MG tablet Take 1 tablet (20 mg total) by mouth once a week. Take with potassium tablet Patient taking differently: Take 20 mg by mouth once a week. Take with potassium tablet on Tuesdays. 04/26/21   Simmons-Robinson, Makiera, MD  glucose blood (ACCU-CHEK GUIDE) test strip USE TO TEST FOUR TIMES DAILY AS DIRECTED 06/03/21   Carollee Leitz, MD  potassium chloride (KLOR-CON) 10 MEQ tablet TAKE 1 TABLET BY MOUTH ONCE WEEKLY SAME DAY AS DAY AS FUROSEMIDE Patient taking differently: Take 10 mEq by mouth once a week. Take with furosemide tablet on Tuesdays. 01/24/21   Imogene Burn, PA-C  promethazine-dextromethorphan (PROMETHAZINE-DM) 6.25-15 MG/5ML syrup Take 5 mLs by mouth 4 (four) times daily as needed for cough. Patient not taking: Reported on 06/01/2021 03/21/21   Hazel Sams, PA-C  Vitamin D, Cholecalciferol, 25 MCG (1000 UT) TABS Take 1,000 Units by mouth daily. 04/22/21   [provider]    Family History Family History  Problem Relation Age of Onset   Hypertension Mother    Heart disease Mother    Diabetes  Father    Breast cancer Maternal Grandmother    Asthma Son     Social History Social History   Tobacco Use   Smoking status: Former    Types: Cigarettes    Quit date: 11/17/2017    Years since quitting: 3.6   Smokeless tobacco: Never  Vaping Use   Vaping Use: Never used  Substance Use Topics   Alcohol use: Yes    Comment: occasional   Drug use: No     Allergies   Patient has no known allergies.   Review of Systems Review of Systems Per HPI  Physical Exam Triage Vital Signs ED Triage Vitals  Enc Vitals Group     BP 07/18/21  1524 131/72     Pulse Rate 07/18/21 1524 (!) 50     Resp 07/18/21 1524 18     Temp 07/18/21 1524 99.8 F (37.7 C)     Temp src --      SpO2 07/18/21 1524 100 %     Weight --      Height --      Head Circumference --      Peak Flow --      Pain Score 07/18/21 1527 8     Pain Loc --      Pain Edu? --      Excl. in [place]? --    No data found.  Updated Vital Signs BP 131/72   Pulse (!) 50   Temp 99.8 F (37.7 C)   Resp 18   SpO2 100%   Visual Acuity Right Eye Distance: 20/25 Left Eye Distance: 20/20 Bilateral Distance: 20/25  Right Eye Near:   Left Eye Near:    Bilateral Near:     Physical Exam Vitals and nursing note reviewed.  Constitutional:      Appearance: Normal appearance. She is not ill-appearing.  HENT:     Head: Atraumatic.     Mouth/Throat:     Mouth: Mucous membranes are moist.     Pharynx: Oropharynx is clear.  Eyes:     Extraocular Movements: Extraocular movements intact.     Pupils: Pupils are equal, round, and reactive to light.     Comments: Small subconjunctival hemorrhage right lateral conjunctiva.  No evidence of foreign body  Cardiovascular:     Rate and Rhythm: Normal rate and regular rhythm.     Heart sounds: Normal heart sounds.  Pulmonary:     Effort: Pulmonary effort is normal.     Breath sounds: Normal breath sounds.  Musculoskeletal:        General: Normal range of motion.     Cervical  back: Normal range of motion and neck supple.  Skin:    General: Skin is warm and dry.  Neurological:     General: No focal deficit present.     Mental Status: She is alert and oriented to person, place, and time.     Cranial Nerves: No cranial nerve deficit.     Motor: No weakness.     Gait: Gait normal.  Psychiatric:        Mood and Affect: Mood normal.        Thought Content: Thought content normal.        Judgment: Judgment normal.     UC Treatments / Results  Labs (all labs ordered are listed, but only abnormal results are displayed) Labs Reviewed - No data to display  EKG   Radiology No results found.  Procedures Procedures (including critical care time)  Medications Ordered in UC Medications - No data to display  Initial Impression / Assessment and Plan / UC Course  I have reviewed the triage vital signs and the nursing notes.  Pertinent labs & imaging results that were available during my care of the patient were reviewed by me and considered in my medical decision making (see chart for details).     Exam reassuring, visual acuity at her baseline.  Will give erythromycin ointment for comfort and have her follow-up with eye specialist first thing next week if symptoms not improving.  No red flag findings today but discussed going to the emergency department if symptoms worsen in any time.  Unclear if the headache  is related to the eye complaint today, possibly related to her blood pressure which she states is elevated off and on and was most recently elevated on Friday.  Continue to monitor and get emergency department if worsening. Final Clinical Impressions(s) / UC Diagnoses   Final diagnoses:  Subconjunctival hemorrhage of right eye  Acute nonintractable headache, unspecified headache type     Discharge Instructions      Follow up with eye specialist first thing next week     ED Prescriptions     Medication Sig Dispense Auth. Provider    erythromycin ophthalmic ointment Place a 1/2 inch ribbon of ointment into the right lower eyelid twice daily as needed. 3.5 g Volney American, [place]      PDMP not reviewed this encounter.   Volney American, [place] 07/18/21 1627

## 2021-07-18 NOTE — ED Triage Notes (Signed)
Pt reports eye problem started yesterday. Pt also reports a HA.

## 2021-07-18 NOTE — Discharge Instructions (Addendum)
Follow up with eye specialist first thing next week

## 2021-07-19 ENCOUNTER — Other Ambulatory Visit: Payer: Self-pay | Admitting: Family Medicine

## 2021-07-22 ENCOUNTER — Other Ambulatory Visit: Payer: Self-pay | Admitting: Family Medicine

## 2021-07-24 ENCOUNTER — Encounter: Payer: Self-pay | Admitting: Physician Assistant

## 2021-07-24 NOTE — Progress Notes (Addendum)
Cardiology Office Note    Date:  07/26/2021   ID:  Debra Diaz, Debra Diaz Dec 02, 1976, MRN [id number]  PCP:  Carollee Leitz, MD  Cardiologist:  Dorris Carnes, MD  Electrophysiologist:  None   Chief Complaint: f/u chest pain  History of Present Illness:   Debra Diaz is a 44 y.o. female with history of atrial myxoma s/p excision in 2016, T2DM, HTN, anemia, obesity, chest pain who presents for follow-up. She established care with cardiology in 2016 for sudden onset of chest pain and elevated troponin. Echo showed left atrial myxoma. Cardiac cath showed angiographically near-normal coronary arteries, normal LVEDP, significant radial artery and brachial artery spasm. She went on to have myoxma resection. It appears chest pain has been an issue ever since that time. Stress test 2017 was normal. 2D Echo 12/2016 was normal. At last OV 04/2021, Imdur was switched back to amlodipine. Per Dr. Harrington Challenger, at most would consider CPX in the future. She has had additional ED visits since that time with normal troponins, d-dimer, CXR.  She is seen back today in follow-up with interpreter Verdis Frederickson. Her daughter is also here today. 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 The change from Imdur to amlodipine has definitely helped her chest pain. She still gets rare fleeting discomfort in the AM but she is able to clean houses up and down stairs without any provocation of symptoms. However, she has noticed at times her pulse will be running low - on one occasion it was 32bpm while checking her BP routinely so she went to the ED. Her HRs have been normal on recent EKGs but is 48-49 in clinic today. She does complain of generalized fatigue, occasional headaches and dizziness. These do not seem explicitly related to the low HR readings. She reports sleep study 2 yrs ago at a clinic in [place] but does not recall the results. Daughter reports patient has significant snoring at  night.    Labwork independently reviewed: 05/2021 trig 180, LDL 98, A1c 8.1, troponins negx2, WBC 11.2, Hgb/plt wnl, K 3.4, Cr 0.57 04/2021 d-dimer wnl, troponins neg   Past Medical History:  Diagnosis Date   Anemia    Diabetes mellitus without complication ([place])    Hypertension    Hypokalemia    Morbid obesity ([place])    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    Past Surgical History:  Procedure Laterality Date   [place] and 2011   x 2   LEFT HEART CATHETERIZATION WITH CORONARY ANGIOGRAM N/A 03/02/2015   Procedure: LEFT HEART CATHETERIZATION WITH CORONARY ANGIOGRAM;  Surgeon: [name], MD;  Location: [facility] CATH LAB;  Service: Cardiovascular;  Laterality: N/A;   MINIMALLY INVASIVE EXCISION OF ATRIAL MYXOMA N/A 03/04/2015   Procedure: MINIMALLY INVASIVE RESECTION OF LEFT ATRIAL MYXOMA ( USING A BILAYER PATCH CLOSURE);  Surgeon: [name], MD;  Location: [place];  Service: Open Heart Surgery;  Laterality: N/A;   myxoma N/A    chest   TEE WITHOUT CARDIOVERSION N/A 03/01/2015   Procedure: TRANSESOPHAGEAL ECHOCARDIOGRAM (TEE);  Surgeon: [name], MD;  Location: [facility] ENDOSCOPY;  Service: Cardiovascular;  Laterality: N/A;   TEE WITHOUT CARDIOVERSION N/A 03/04/2015   Procedure: TRANSESOPHAGEAL ECHOCARDIOGRAM (TEE);  Surgeon: [name], MD;  Location: [place];  Service: Open Heart Surgery;  Laterality: N/A;   TUBAL LIGATION  2011    Current Medications: Current Meds  Medication Sig  ACCU-CHEK GUIDE test strip USE TO TEST FOUR TIMES DAILY AS DIRECTED   amLODipine (NORVASC) 5 MG tablet Take 1 tablet (5 mg total) by mouth daily.   aspirin EC 81 MG EC tablet Take 1 tablet (81 mg total) by mouth daily.   atorvastatin (LIPITOR) 40 MG tablet Take 1 tablet (40 mg total) by mouth daily. TAKE 1 TABLET(40 MG) BY MOUTH DAILY AT 6 PM   blood glucose meter kit and supplies KIT Dispense based on patient and insurance preference. Use up to four  times daily as directed. (FOR ICD-9 250.00, 250.01).   Blood Pressure KIT Check blood pressure twice a day.  Dx code: I10   erythromycin ophthalmic ointment Place a 1/2 inch ribbon of ointment into the right lower eyelid twice daily as needed.   ferrous sulfate 325 (65 FE) MG EC tablet Take 1 tablet (325 mg total) by mouth in the morning, at noon, and at bedtime.   lisinopril-hydrochlorothiazide (ZESTORETIC) 20-25 MG tablet TAKE 1 TABLET BY MOUTH DAILY   metFORMIN (GLUCOPHAGE-XR) 500 MG 24 hr tablet Take 1 tablet (500 mg total) by mouth in the morning and at bedtime.   polyethylene glycol powder (GLYCOLAX/MIRALAX) 17 GM/SCOOP powder Start with one scoop daily, increase to two scoops daily as needed for soft bowel movement each day.   potassium chloride (KLOR-CON) 10 MEQ tablet TAKE 1 TABLET BY MOUTH ONCE WEEKLY SAME DAY AS DAY AS FUROSEMIDE   promethazine-dextromethorphan (PROMETHAZINE-DM) 6.25-15 MG/5ML syrup Take 5 mLs by mouth 4 (four) times daily as needed for cough.   Vitamin D, Cholecalciferol, 25 MCG (1000 UT) TABS Take 1,000 Units by mouth daily.   [DISCONTINUED] furosemide (LASIX) 20 MG tablet Take 1 tablet (20 mg total) by mouth once a week. Take with potassium tablet      Allergies:   Patient has no known allergies.   Social History   Socioeconomic History   Marital status: Married    Spouse name: Not on file   Number of children: Not on file   Years of education: Not on file   Highest education level: Not on file  Occupational History   Not on file  Tobacco Use   Smoking status: Former    Types: Cigarettes    Quit date: 11/17/2017    Years since quitting: 3.6   Smokeless tobacco: Never  Vaping Use   Vaping Use: Never used  Substance and Sexual Activity   Alcohol use: Yes    Comment: occasional   Drug use: No   Sexual activity: Yes    Birth control/protection: Surgical  Other Topics Concern   Not on file  Social History Narrative   Not on file   Social  Determinants of Health   Financial Resource Strain: Not on file  Food Insecurity: Not on file  Transportation Needs: Not on file  Physical Activity: Not on file  Stress: Not on file  Social Connections: Not on file     Family History:  The patient's family history includes Asthma in her son; Breast cancer in her maternal grandmother; Diabetes in her father; Heart disease in her mother; Hypertension in her mother.  ROS:   Please see the history of present illness.  All other systems are reviewed and otherwise negative.    EKGs/Labs/Other Studies Reviewed:    Studies reviewed are outlined and summarized above. Reports included below if pertinent.  2D Echo 2018 - Left ventricle: The cavity size was normal. Wall thickness was    normal. Systolic  function was normal. The estimated ejection    fraction was in the range of 55% to 60%. Wall motion was normal;    there were no regional wall motion abnormalities. Left    ventricular diastolic function parameters were normal.  - Left atrium: The atrium was normal in size.  - Tricuspid valve: There was trivial regurgitation.  - Pulmonary arteries: PA peak pressure: 28 mm Hg (S).  - Inferior vena cava: The vessel was normal in size. The    respirophasic diameter changes were in the normal range (>= 50%),    consistent with normal central venous pressure.   Impressions:   - Essentially normal study.    Cath 2016 INTERVENTIONAL CARDIOLOGIST: [name], M.D., MS PRIMARY CARE PROVIDER: No PCP Per Patient PRIMARY CARDIOLOGIST:  New to [facility];  Consult by Dr. Debara Pickett. Rounded on by Dr. Johnsie Cancel CARDIAC SURGEON: Dr. Roxy Manns   PATIENT:  AMIAYAH GIEBEL is a 45 y.o. Spanish only speaking female with a history of hypertension and type 2 diabetes as well as hypertension. She presented with sudden onset frontal temporal headache and central chest pressure. She noted left arm numbness and dyspnea. She noted 710 chest pain that  improved 3 out 10 chest pain with ibuprofen. She had an elevated troponin level that did not reach greater than 2. 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  She has been evaluated by Dr. Roxy Manns from [facility] who recommended angiographic evaluation. Patient now posted for invasive cardiovascular evaluation with left heart catheterization diagnostic coronary angiography.. I was asked to use radial access of possible, in order to preserve femoral access for "Mid-CAP" cardiac surgery.   PRE-OPERATIVE DIAGNOSIS:   NSTEMI Left Atrial Myxoma   PROCEDURES PERFORMED:   Left Heart Catheterization with Native Coronary Angiography  via Right Radial And Left Common Femoral Artery Direct Ultrasound Guided Radial Artery Access   PROCEDURE: The patient was brought to the 2nd [facility] in the fasting state and prepped and draped in the usual sterile fashion for Right Radial artery access. A modified Allen's test was performed on the Right wrist demonstrating excellent collateral flow for radial access.   Sterile technique was used including antiseptics, cap, gloves, gown, hand hygiene, mask and sheet. Skin prep: Chlorhexidine.   Consent: Risks of procedure as well as the alternatives and risks of each were explained to the (patient/caregiver). Consent for procedure obtained.    Time Out: Verified patient identification, verified procedure, site/side was marked, verified correct patient position, special equipment/implants available, medications/allergies/relevent history reviewed, required imaging and test results available. Performed.   Access: Right Radial Artery: 6 Fr Sheath -  Seldinger Technique (Angiocath Micropuncture Kit) Direct Ultrasound Guided Access after several failed attempts due to clotting of blood in the access needle Radial Cocktail - 10 mL; IV Heparin 5000 Units    Left Common Femoral Artery: 5 Fr Sheath -  fluoroscopically guided modified Seldinger technique   Left Heart Catheterization: From radial access: 5 French TIG 4.0 catheter was advanced over long exchange safety wire into the ascending aorta. Unfortunately the catheter would not engage either the coronary arteries. The aortic valve was successfully crossed and left ventricular hemodynamics was obtained. Unfortunately, upon exchanging the TIG 4.0 catheter for a diagnostic JR4 catheter both 5 [place] and 4 [place], the patient had significant arm pain and vasospasm. Despite the administration of additional 3 mg of IA verapamil, the 4 French catheter was not able to be  advanced beyond the mid brachial artery. At this point the decision was made to abandon radial approach and switch to left femoral approach in order to preserve the right femoral access for planned CT surgery. Left femoral artery access: 5 Fr Catheters advanced or exchanged over a standard J-wire under fluoroscopic guidance; JL 3.5 catheter advanced first. Left Coronary Artery Cineangiography: JL 3.5 Catheter Right Coronary Artery Cineangiography: JR4 Catheter LV Hemodynamics: TIG 4.0 catheter from radial approach   Right Radial Sheath was removed in the [place] with TR Band placement for hemostasis once the decision was made to abandon radial access. TR Band: 1804  Hours; 9 mL air Left femoral artery sheath was sutured in place as the ACT > 200 sec - to be removed in PACU holding area once ACT < 170 sec   FINDINGS:  Hemodynamics:  Central Aortic Pressure / Mean: 109/73/91  mmHg Left Ventricular Pressure / LVEDP: 110/2/9  mmHg   Left Ventriculography: Deferred   Coronary Anatomy: Dominance: Right Left Main: Very Large caliber vessel that trifurcates into the LAD, Ramus Intermedius, and Left Circumflex. Angiographically normal   LAD: Large-caliber vessel with a very proximal large caliber High First Diagonal Branch. The LAD has mild diffuse tender 20% lesions  but is relatively atrophic normal as it courses down around the apex perfusing the distal third of the inferoapex. D1: Large caliber high branch with several distal branches. Mild possible luminal irregularities of less than 30%. D2: Moderate caliber vessel that arises from the mid LAD. It does not cover large distribution but is angiographically normal.   Left Circumflex: Large-caliber, nondominant vessel that courses mostly as a large lateral bifurcating OM branch. The inferior branch is much larger than the superior branch. The distal vessel is tortuous and reaches down almost of the inferoapex. Mild luminal irregularity. There is a very small AV groove branch..   Ramus intermedius: Large caliber vessel that courses is a high OM. It gives off a moderate sized branch from the mid vessel just after a eccentric tubular 30% lesion.. The daughter vessel and parent both reaches almost to the apex. They are somewhat tortuous, but relatively free of disease.   RCA: Large-caliber codominant vessel that gives rise to a significant RV marginal branch in the mid vessel. It bifurcates distally into the Right Posterior Descending Artery  (RPDA) and the Right Posterior AV Groove Branch (RPAV).  Angiographically normal. RPDA: Large caliber vessel that reaches two thirds the way to the apex. Angiographically normal. RPL Sysytem:The RPAV begins as a moderate large vessel that terminates as a moderate caliber posterolateral branch. Angiographically normal.   MEDICATIONS: Anesthesia:  Local Lidocaine 2 mL for radial access, 16 mL for left femoral access Sedation:  32m IV Versed, 100  mcg IV fentanyl ; Omnipaque Contrast: 60  ml Anticoagulation:  IV Heparin 5000  Units Radial Cocktail: 5 mg Verapamil, 400 mcg NTG, 2 ml 2% Lidocaine in 10 ml NS IA Verapamil 3 mg Subcutaneous 100 g NTG   PATIENT DISPOSITION:   The patient was transferred to the PACU holding area in a hemodynamicaly stable, chest pain free  condition. The patient tolerated the procedure well, and there were no complications.  EBL:   < 10  ml The patient was stable before, during, and after the procedure.   POST-OPERATIVE DIAGNOSIS:   Angiographically only minimal coronary artery disease, but for the most part angiographic normal coronary arteries. Normal LVEDP Significant right radial and brachial artery spasm   PLAN OF CARE: Standard  post radial and femoral cath care. Sheath will be removed in the PACU holding area. Anticipate resection of left atrial myxoma as soon as Friday 3/18     HARDING, Leonie Green, M.D., M.S. Interventional Cardiologist  Pager # [phone]      EKG:  EKG is ordered today, personally reviewed, demonstrating SB 49bpm no acute changes   Recent Labs: 06/01/2021: BUN 8; Creatinine, Ser 0.57; Hemoglobin 13.3; Platelets 296; Potassium 3.4; Sodium 136  Recent Lipid Panel    Component Value Date/Time   CHOL 161 06/03/2021 1428   TRIG 180 (H) 06/03/2021 1428   HDL 32 (L) 06/03/2021 1428   CHOLHDL 5.0 (H) 06/03/2021 1428   CHOLHDL 4.0 09/01/2016 1607   VLDL 42 (H) 09/01/2016 1607   LDLCALC 98 06/03/2021 1428    PHYSICAL EXAM:    VS:  BP 122/68   Pulse (!) 48   Ht 5' 2"  (1.575 m)   Wt 203 lb 12.8 oz (92.4 kg)   SpO2 96%   BMI 37.28 kg/m   BMI: Body mass index is 37.28 kg/m.  GEN: Well nourished, well developed female in no acute distress HEENT: normocephalic, atraumatic Neck: no JVD, carotid bruits, or masses Cardiac: RRR;  soft SEM mid precordium, no rubs or gallops, no edema  Respiratory:  clear to auscultation bilaterally, normal work of breathing GI: soft, nontender, nondistended, + BS MS: no deformity or atrophy Skin: warm and dry, no rash Neuro:  Alert and Oriented x 3, Strength and sensation are intact, follows commands Psych: euthymic mood, full affect  Wt Readings from Last 3 Encounters:  07/26/21 203 lb 12.8 oz (92.4 kg)  06/03/21 204 lb 6.4 oz (92.7 kg)  06/01/21 203 lb  (92.1 kg)     ASSESSMENT & PLAN:   1. Sinus bradycardia - HR noted to be in the upper 40s in clinic today, no acute symptoms. Not on any AVN blocking agents. She also reports having an isolated HR in the 30s at one time by BP cuff. At times she is having dizziness but it's not clear this is completely related to any HR abnormalities as it also happens with normal HR as well. Will check BMET, Mg, TSH today and plan a 7 day Zio along with 2D echocardiogram. Continue to avoid AVN blocking agents.  2. Chest pain, minimal CAD by cath 2016 - improved with switch to amlodipine. Will await echocardiogram, otherwise continue current medication plan. Lipids are managed in primary care.  3. Atrial myxoma repair - reviewed with Dr. Burt Knack, SBE ppx not needed past 6 months. Will follow up echocardiogram results. Soft murmur on exam.  4. Hypokalemia - ran out of once-weekly Lasix a week ago, had only been taking Kcl once weekly when she took Lasix. Hold off further Lasix since she's also on maintenance HCTZ. Check BMET/Mg today and consider routine supplementation if indicated.  5. Essential HTN - BP well controlled. No changes made today.  6. Fatigue/sleep disordered breathing - will see if we can get a copy of prior sleep study 2 years ago for review. If this was positive for OSA would get her into see one of our sleep team specialists. But if negative, would consider repeating study given fatigue, headaches and obesity. Will also f/u thyroid and CBC today.   Disposition: F/u with Dr. Harrington Challenger in 6 months.   Medication Adjustments/Labs and Tests Ordered: Current medicines are reviewed at length with the patient today.  Concerns regarding medicines are outlined above. Medication changes, Labs and  Tests ordered today are summarized above and listed in the Patient Instructions accessible in Encounters.   Signed, Charlie Pitter, PA-C  07/26/2021 10:27 AM    [facility] [address],  [place], [place]  [address] Phone: [phone]; Fax: [phone]

## 2021-07-26 ENCOUNTER — Encounter: Payer: Self-pay | Admitting: Physician Assistant

## 2021-07-26 ENCOUNTER — Ambulatory Visit (INDEPENDENT_AMBULATORY_CARE_PROVIDER_SITE_OTHER): Payer: Medicaid Other | Admitting: Physician Assistant

## 2021-07-26 ENCOUNTER — Ambulatory Visit (INDEPENDENT_AMBULATORY_CARE_PROVIDER_SITE_OTHER): Payer: Medicaid Other

## 2021-07-26 ENCOUNTER — Other Ambulatory Visit: Payer: Self-pay

## 2021-07-26 VITALS — BP 122/68 | HR 48 | Ht 62.0 in | Wt 203.8 lb

## 2021-07-26 DIAGNOSIS — R079 Chest pain, unspecified: Secondary | ICD-10-CM

## 2021-07-26 DIAGNOSIS — I10 Essential (primary) hypertension: Secondary | ICD-10-CM

## 2021-07-26 DIAGNOSIS — E876 Hypokalemia: Secondary | ICD-10-CM

## 2021-07-26 DIAGNOSIS — G4730 Sleep apnea, unspecified: Secondary | ICD-10-CM

## 2021-07-26 DIAGNOSIS — R42 Dizziness and giddiness: Secondary | ICD-10-CM

## 2021-07-26 DIAGNOSIS — D151 Benign neoplasm of heart: Secondary | ICD-10-CM

## 2021-07-26 DIAGNOSIS — R001 Bradycardia, unspecified: Secondary | ICD-10-CM

## 2021-07-26 DIAGNOSIS — R5383 Other fatigue: Secondary | ICD-10-CM

## 2021-07-26 LAB — CBC
Hematocrit: 43.4 % (ref 34.0–46.6)
Hemoglobin: 13.9 g/dL (ref 11.1–15.9)
MCH: 28.4 pg (ref 26.6–33.0)
MCHC: 32 g/dL (ref 31.5–35.7)
MCV: 89 fL (ref 79–97)
Platelets: 329 10*3/uL (ref 150–450)
RBC: 4.89 x10E6/uL (ref 3.77–5.28)
RDW: 13.4 % (ref 11.7–15.4)
WBC: 10.7 10*3/uL (ref 3.4–10.8)

## 2021-07-26 LAB — BASIC METABOLIC PANEL
BUN/Creatinine Ratio: 20 (ref 9–23)
BUN: 12 mg/dL (ref 6–24)
CO2: 23 mmol/L (ref 20–29)
Calcium: 9.3 mg/dL (ref 8.7–10.2)
Chloride: 99 mmol/L (ref 96–106)
Creatinine, Ser: 0.6 mg/dL (ref 0.57–1.00)
Glucose: 155 mg/dL — ABNORMAL HIGH (ref 65–99)
Potassium: 4.7 mmol/L (ref 3.5–5.2)
Sodium: 137 mmol/L (ref 134–144)
eGFR: 112 mL/min/{1.73_m2} (ref >59)

## 2021-07-26 LAB — MAGNESIUM: Magnesium: 2 mg/dL (ref 1.6–2.3)

## 2021-07-26 LAB — TSH: TSH: 1.45 u[IU]/mL (ref 0.450–4.500)

## 2021-07-26 NOTE — Patient Instructions (Signed)
Medication Instructions:  Your physician has recommended you make the following change in your medication:   STOP Lasix  *If you need a refill on your cardiac medications before your next appointment, please call your pharmacy*   Lab Work: TODAY:  BMET, CBC, MAG, * TSH  If you have labs (blood work) drawn today and your tests are completely normal, you will receive your results only by: [place] (if you have MyChart) OR A paper copy in the mail If you have any lab test that is abnormal or we need to change your treatment, we will call you to review the results.   Testing/Procedures: Your physician has requested that you have an echocardiogram. Echocardiography is a painless test that uses sound waves to create images of your heart. It provides your doctor with information about the size and shape of your heart and how well your heart's chambers and valves are working. This procedure takes approximately one hour. There are no restrictions for this procedure.   ZIO XT- Long Term Monitor Instructions  Your physician has requested you wear a ZIO patch monitor for 14 days.  This is a single patch monitor. [employer] supplies one patch monitor per enrollment. Additional stickers are not available. Please do not apply patch if you will be having a Nuclear Stress Test,  Echocardiogram, Cardiac CT, MRI, or Chest Xray during the period you would be wearing the  monitor. The patch cannot be worn during these tests. You cannot remove and re-apply the  ZIO XT patch monitor.  Your ZIO patch monitor will be mailed 3 day USPS to your address on file. It may take 3-5 days  to receive your monitor after you have been enrolled.  Once you have received your monitor, please review the enclosed instructions. Your monitor  has already been registered assigning a specific monitor serial # to you.  Billing and Patient Assistance Program Information  We have supplied [employer] with any of your insurance  information on file for billing purposes. [employer] offers a sliding scale Patient Assistance Program for patients that do not have  insurance, or whose insurance does not completely cover the cost of the ZIO monitor.  You must apply for the Patient Assistance Program to qualify for this discounted rate.  To apply, please call [employer] at [phone], select option 4, select option 2, ask to apply for  Patient Assistance Program. Theodore Demark will ask your household income, and how many people  are in your household. They will quote your out-of-pocket cost based on that information.  [employer] will also be able to set up a 3-month interest-free payment plan if needed.  Applying the monitor   Shave hair from upper left chest.  Hold abrader disc by orange tab. Rub abrader in 40 strokes over the upper left chest as  indicated in your monitor instructions.  Clean area with 4 enclosed alcohol pads. Let dry.  Apply patch as indicated in monitor instructions. Patch will be placed under collarbone on left  side of chest with arrow pointing upward.  Rub patch adhesive wings for 2 minutes. Remove white label marked "1". Remove the white  label marked "2". Rub patch adhesive wings for 2 additional minutes.  While looking in a mirror, press and release button in center of patch. A small green light will  flash 3-4 times. This will be your only indicator that the monitor has been turned on.  Do not shower for the first 24 hours. You may shower after the  first 24 hours.  Press the button if you feel a symptom. You will hear a small click. Record Date, Time and  Symptom in the Patient Logbook.  When you are ready to remove the patch, follow instructions on the last 2 pages of Patient  Logbook. Stick patch monitor onto the last page of Patient Logbook.  Place Patient Logbook in the blue and white box. Use locking tab on box and tape box closed  securely. The blue and white box has prepaid postage on it. Please  place it in the mailbox as  soon as possible. Your physician should have your test results approximately 7 days after the  monitor has been mailed back to [facility].  Call [place] at [phone] if you have questions regarding  your ZIO XT patch monitor. Call them immediately if you see an orange light blinking on your  monitor.  If your monitor falls off in less than 4 days, contact our Monitor department at [phone].  If your monitor becomes loose or falls off after 4 days call [employer] at [phone] for  suggestions on securing your monitor    Follow-Up: At [facility], you and your health needs are our priority.  As part of our continuing mission to provide you with exceptional heart care, we have created designated Provider Care Teams.  These Care Teams include your primary Cardiologist (physician) and Advanced Practice Providers (APPs -  Physician Assistants and Nurse Practitioners) who all work together to provide you with the care you need, when you need it.  We recommend signing up for the patient portal called "MyChart".  Sign up information is provided on this After Visit Summary.  MyChart is used to connect with patients for Virtual Visits (Telemedicine).  Patients are able to view lab/test results, encounter notes, upcoming appointments, etc.  Non-urgent messages can be sent to your provider as well.   To learn more about what you can do with MyChart, go to [email or web address].    Your next appointment:   6 month(s)  The format for your next appointment:   In Person  Provider:   Dorris Carnes, MD   Other Instructions

## 2021-07-26 NOTE — Progress Notes (Unsigned)
Patient enrolled for [employer] to mail a 7 day ZIO XT monitor to her address on file. Spanish instructions requested.

## 2021-07-28 DIAGNOSIS — D151 Benign neoplasm of heart: Secondary | ICD-10-CM

## 2021-07-28 DIAGNOSIS — I10 Essential (primary) hypertension: Secondary | ICD-10-CM

## 2021-07-28 DIAGNOSIS — R079 Chest pain, unspecified: Secondary | ICD-10-CM | POA: Diagnosis not present

## 2021-07-28 DIAGNOSIS — E876 Hypokalemia: Secondary | ICD-10-CM | POA: Diagnosis not present

## 2021-07-28 DIAGNOSIS — R001 Bradycardia, unspecified: Secondary | ICD-10-CM | POA: Diagnosis not present

## 2021-07-28 DIAGNOSIS — G4730 Sleep apnea, unspecified: Secondary | ICD-10-CM

## 2021-07-28 DIAGNOSIS — R5383 Other fatigue: Secondary | ICD-10-CM

## 2021-08-03 NOTE — Progress Notes (Signed)
Pt has been called through [employer], [place], [place] [id number].  Pt has been made aware of normal result and verbalized understanding.  jw

## 2021-08-11 ENCOUNTER — Ambulatory Visit (HOSPITAL_COMMUNITY): Payer: Medicaid Other | Attending: Physician Assistant

## 2021-08-11 ENCOUNTER — Other Ambulatory Visit: Payer: Self-pay

## 2021-08-11 DIAGNOSIS — I10 Essential (primary) hypertension: Secondary | ICD-10-CM | POA: Insufficient documentation

## 2021-08-11 DIAGNOSIS — E876 Hypokalemia: Secondary | ICD-10-CM | POA: Diagnosis not present

## 2021-08-11 DIAGNOSIS — G4730 Sleep apnea, unspecified: Secondary | ICD-10-CM | POA: Diagnosis present

## 2021-08-11 DIAGNOSIS — R079 Chest pain, unspecified: Secondary | ICD-10-CM | POA: Insufficient documentation

## 2021-08-11 DIAGNOSIS — D151 Benign neoplasm of heart: Secondary | ICD-10-CM

## 2021-08-11 DIAGNOSIS — R001 Bradycardia, unspecified: Secondary | ICD-10-CM | POA: Diagnosis present

## 2021-08-11 DIAGNOSIS — R5383 Other fatigue: Secondary | ICD-10-CM | POA: Diagnosis present

## 2021-08-11 LAB — ECHOCARDIOGRAM COMPLETE
AR max vel: 4.5 cm2
AV Area VTI: 3.95 cm2
AV Area mean vel: 4.21 cm2
AV Mean grad: 7.5 mmHg
AV Peak grad: 14.2 mmHg
Ao pk vel: 1.89 m/s
Area-P 1/2: 2.74 cm2
S' Lateral: 2.4 cm

## 2021-08-19 ENCOUNTER — Telehealth: Payer: Self-pay | Admitting: Internal Medicine

## 2021-08-19 NOTE — Telephone Encounter (Signed)
Spoke with pt via interpreter Pt notes fatigue weakness and no energy Pt thought was coming from diabetes but maybe related to low heart rate Will forward to Dr Harrington Challenger for review and recommendations  Patch Wear Time:  5 days and 21 hours (2022-08-11T19:19:34-0400 to 2022-08-17T16:35:17-0400)   Patient had a min HR of 28 bpm, max HR of 115 bpm, and avg HR of 49 bpm. Predominant underlying rhythm was Sinus Rhythm. 1 run of Supraventricular Tachycardia occurred lasting 4 beats with a max rate of 105 bpm (avg 96 bpm). Junctional Rhythm was  present. Supraventricular Tachycardia and Junctional Rhythm were detected within +/- 45 seconds of symptomatic patient event(s). Isolated SVEs were rare (<1.0%), SVE Couplets were rare (<1.0%), and no SVE Triplets were present. Isolated VEs were rare  (<1.0%), and no VE Couplets or VE Triplets were present. Ventricular Trigeminy was present. MD notification criteria for Symptomatic Bradycardia met - report posted prior to notification per account request (TM).

## 2021-08-19 NOTE — Telephone Encounter (Signed)
Jocelyn with [employer] is calling to report abnormal Zio Monitor results.

## 2021-08-30 ENCOUNTER — Telehealth: Payer: Self-pay | Admitting: *Deleted

## 2021-08-30 DIAGNOSIS — R001 Bradycardia, unspecified: Secondary | ICD-10-CM

## 2021-08-30 DIAGNOSIS — R079 Chest pain, unspecified: Secondary | ICD-10-CM

## 2021-08-30 NOTE — Telephone Encounter (Signed)
-----   Message from Dorris Carnes V, MD sent at 08/25/2021 11:12 PM EDT ----- Monitor showed SB/SR    Average HR 49   No dangerous pauses No triggered events I would recomm a regular GXT to she how her HR and BP respond to demands

## 2021-08-30 NOTE — Telephone Encounter (Signed)
Spoke w patient via Debra Diaz.  Adv of monitor result and recommendation for treadmill test.  She asked about phone call when her HR went to 29 while she was asleep.  Adv that Dr. Harrington Challenger is aware of that and that her AVG HR is 49 and that because she was asleep it was not worriesome. Aware no food/drinks/alcohol/tobacco for 4 hrs prior to test.  Aware to wear comfortable clothing/shoes to walk on treadmill.  Okay to take medicines like normal.  Scheduled her for Tue 09/06/21 at 10:30 am.  Aware to eat breakfast prior to 6:30 am.  No other questions.

## 2021-09-01 NOTE — Addendum Note (Signed)
Addended by: Rodman Key on: 09/01/2021 05:28 PM   Modules accepted: Orders

## 2021-09-01 NOTE — Addendum Note (Signed)
Addended by: Fay Records on: 09/01/2021 10:07 PM   Modules accepted: Orders

## 2021-09-01 NOTE — Telephone Encounter (Signed)
Attestation order placed.  Call routed to Dr. Harrington Challenger to sign.

## 2021-09-06 ENCOUNTER — Other Ambulatory Visit: Payer: Self-pay

## 2021-09-06 ENCOUNTER — Ambulatory Visit (INDEPENDENT_AMBULATORY_CARE_PROVIDER_SITE_OTHER): Payer: Medicaid Other

## 2021-09-06 DIAGNOSIS — R079 Chest pain, unspecified: Secondary | ICD-10-CM

## 2021-09-06 DIAGNOSIS — R001 Bradycardia, unspecified: Secondary | ICD-10-CM | POA: Diagnosis not present

## 2021-09-07 LAB — EXERCISE TOLERANCE TEST
Angina Index: 0
Base ST Depression (mm): 0 mm
Duke Treadmill Score: 8
Estimated workload: 10.1
Exercise duration (min): 8 min
Exercise duration (sec): 12 s
MPHR: 174 {beats}/min
Peak HR: 126 {beats}/min
Percent HR: 72 %
RPE: 16
Rest HR: 53 {beats}/min
ST Depression (mm): 0 mm

## 2021-09-13 ENCOUNTER — Other Ambulatory Visit: Payer: Self-pay | Admitting: Family Medicine

## 2021-09-13 DIAGNOSIS — Z1231 Encounter for screening mammogram for malignant neoplasm of breast: Secondary | ICD-10-CM

## 2021-09-22 NOTE — Patient Instructions (Addendum)
Thank you for coming to see me today. It was a pleasure. Today we talked about:   You can call [facility] to schedule an appointment for the colonoscopy.  The number is [phone].  We will get some labs today.  If they are abnormal or we need to do something about them, I will call you.  If they are normal, I will send you a message on MyChart (if it is active) or a letter in the mail.  If you don't hear from [place] in 2 weeks, please call the office at the number below.   Continue your current blood pressure medication Losartan/ HCTZ.  You have 3 refills at your pharmacy.  Please call them to pick up your next month supply  I reordered your Miralax.  You should pay attention to your hemoglobin A1C.  It is a three month test about your average blood sugar. If the A1C is - <7.0 is great.  That is our goal for treating you. - Between 7.0 and 9.0 is not so good.  We would need to work to do better. - Above 9.0 is terrible.  You would really need to work with [place] to get it under control.    Today's A1C = 7.1 Last check was 8.1  Continue to exercise and diet.  I will check into adding another medication and call you if I can get it approved   Return to clinic in 1 week for blood work  Please follow-up with me in 3 months  If you have any questions or concerns, please do not hesitate to call the office at [phone].  Best,   Carollee Leitz, MD  .

## 2021-09-22 NOTE — Progress Notes (Signed)
    SUBJECTIVE:   CHIEF COMPLAINT / HPI:   Diabetes Type 2 CBG at home 110-115 fasting.  Denies any polyuria, polydipsia, nausea/ vomiting, abdominal pain, headaches, visual changes, chest pain or shortness of breath.  Has been trying to maintain healthy lifestyle and now exercising daily.  Compliant with Metformin 500 mg twice daily.    HTN Medications discontinued by Cardiology Amlodipine.  Was switched to Losartan -HCTZ 50/12.5 mg from Zestoretic due to cough side effect.  Tolerating new medication well.  Not measuring BP at home.    Constipation Ran out of Miralax.  Also reports still waiting for colonoscopy.  Denies any bloody stool, abdominal pain or urinary symptoms.  Drinking plenty of water.  PERTINENT  PMH / PSH:  Bradycardia HTN DM Type 2 Obesity class 2  OBJECTIVE:   BP 138/76   Pulse (!) 51   Ht 5\' 2"  (1.575 m)   Wt 205 lb 12.8 oz (93.4 kg)   SpO2 100%   BMI 37.64 kg/m    General: Alert, no acute distress Cardio: Normal S1 and S2, Sinus Bradycardia and regular, no r/m/g,  Pulm: CTAB, normal work of breathing Abdomen: Bowel sounds normal. Abdomen soft and non-tender.  Extremities: No peripheral edema.   ASSESSMENT/PLAN:   DM2 (diabetes mellitus, type 2) (HCC) Last A1c 8.1.  Compliant with Metformin 500 mg BID.  Has incorporated healthy lifestyle. -A1c today 7.2 -Start Jardiance 10 mg daily -Repeat BMet in 1 week, order placed -Follow up in 3 months  HTN (hypertension) Slightly elevated today.  Has not taken BP meds. Recently switched to Hyzarr 50-12.5mg . -Continue to monitor -If elevated at next viist will increase dose to 100/25 -Continue to monitor at home -Strict return precautions provided  Hyperlipidemia associated with type 2 diabetes mellitus (HCC) Repeat direct LDL Continue statin If elevated can increase statin to 80 mg Continue to exercise and diet  Constipation No red flags.  Awaiting colonscopy.  Referral has been made.   -Provided  patient with number to call and schedule. -Refill Miralax daily -Decrease iron supplements from TID to daily -Increase fibre intake    Flu vaccine today  Ophthalmology referral placed 05/10 Colonoscopy referral placed 06/17 I have requested to referrals CMA that we follow up with scheduling appointment for her as she has difficulty making appointments with language barrier.    Carollee Leitz, MD [place]

## 2021-09-27 ENCOUNTER — Ambulatory Visit (INDEPENDENT_AMBULATORY_CARE_PROVIDER_SITE_OTHER): Payer: Medicaid Other | Admitting: Family Medicine

## 2021-09-27 ENCOUNTER — Other Ambulatory Visit: Payer: Self-pay

## 2021-09-27 ENCOUNTER — Encounter: Payer: Self-pay | Admitting: Family Medicine

## 2021-09-27 VITALS — BP 138/76 | HR 51 | Ht 62.0 in | Wt 205.8 lb

## 2021-09-27 DIAGNOSIS — E1169 Type 2 diabetes mellitus with other specified complication: Secondary | ICD-10-CM | POA: Diagnosis not present

## 2021-09-27 DIAGNOSIS — I10 Essential (primary) hypertension: Secondary | ICD-10-CM | POA: Diagnosis not present

## 2021-09-27 DIAGNOSIS — K5900 Constipation, unspecified: Secondary | ICD-10-CM | POA: Diagnosis not present

## 2021-09-27 DIAGNOSIS — E1159 Type 2 diabetes mellitus with other circulatory complications: Secondary | ICD-10-CM

## 2021-09-27 DIAGNOSIS — Z23 Encounter for immunization: Secondary | ICD-10-CM

## 2021-09-27 DIAGNOSIS — E785 Hyperlipidemia, unspecified: Secondary | ICD-10-CM

## 2021-09-27 LAB — POCT GLYCOSYLATED HEMOGLOBIN (HGB A1C): HbA1c, POC (controlled diabetic range): 7.2 % — AB (ref 0.0–7.0)

## 2021-09-27 MED ORDER — EMPAGLIFLOZIN 10 MG PO TABS: 10.0000 mg | ORAL_TABLET | Freq: Every day | ORAL | 0 refills | Status: DC

## 2021-09-27 MED ORDER — POLYETHYLENE GLYCOL 3350 17 GM/SCOOP PO POWD: ORAL | 0 refills | Status: DC

## 2021-09-28 LAB — LDL CHOLESTEROL, DIRECT: LDL Direct: 97 mg/dL (ref 0–99)

## 2021-09-30 ENCOUNTER — Encounter: Payer: Self-pay | Admitting: Family Medicine

## 2021-10-01 ENCOUNTER — Encounter: Payer: Self-pay | Admitting: Family Medicine

## 2021-10-01 MED ORDER — METFORMIN HCL ER 500 MG PO TB24: 500.0000 mg | ORAL_TABLET | Freq: Two times a day (BID) | ORAL | 3 refills | Status: DC

## 2021-10-01 MED ORDER — ASPIRIN 81 MG PO TBEC: 81.0000 mg | DELAYED_RELEASE_TABLET | Freq: Every day | ORAL | 3 refills | Status: DC

## 2021-10-01 MED ORDER — FERROUS SULFATE 325 (65 FE) MG PO TBEC: 325.0000 mg | DELAYED_RELEASE_TABLET | Freq: Every day | ORAL | 0 refills | Status: DC

## 2021-10-01 MED ORDER — LOSARTAN POTASSIUM-HCTZ 50-12.5 MG PO TABS: 1.0000 | ORAL_TABLET | Freq: Every day | ORAL | Status: DC

## 2021-10-01 NOTE — Assessment & Plan Note (Signed)
Last A1c 8.1.  Compliant with Metformin 500 mg BID.  Has incorporated healthy lifestyle. -A1c today 7.2 -Start Jardiance 10 mg daily -Repeat BMet in 1 week, order placed -Follow up in 3 months

## 2021-10-01 NOTE — Assessment & Plan Note (Signed)
No red flags.  Awaiting colonscopy.  Referral has been made.   -Provided patient with number to call and schedule. -Refill Miralax daily -Decrease iron supplements from TID to daily -Increase fibre intake

## 2021-10-01 NOTE — Assessment & Plan Note (Signed)
Slightly elevated today.  Has not taken BP meds. Recently switched to Hyzarr 50-12.5mg . -Continue to monitor -If elevated at next viist will increase dose to 100/25 -Continue to monitor at home -Strict return precautions provided

## 2021-10-01 NOTE — Assessment & Plan Note (Signed)
Repeat direct LDL Continue statin If elevated can increase statin to 80 mg Continue to exercise and diet

## 2021-10-04 ENCOUNTER — Encounter: Payer: Self-pay | Admitting: *Deleted

## 2021-10-04 NOTE — Telephone Encounter (Signed)
Please let patient she needs to take both.  Thanks

## 2021-10-10 ENCOUNTER — Other Ambulatory Visit: Payer: Self-pay

## 2021-10-10 ENCOUNTER — Other Ambulatory Visit: Payer: Medicaid Other

## 2021-10-10 DIAGNOSIS — E1159 Type 2 diabetes mellitus with other circulatory complications: Secondary | ICD-10-CM

## 2021-10-11 LAB — BASIC METABOLIC PANEL
BUN/Creatinine Ratio: 23 (ref 9–23)
BUN: 15 mg/dL (ref 6–24)
CO2: 25 mmol/L (ref 20–29)
Calcium: 9.5 mg/dL (ref 8.7–10.2)
Chloride: 102 mmol/L (ref 96–106)
Creatinine, Ser: 0.66 mg/dL (ref 0.57–1.00)
Glucose: 120 mg/dL — ABNORMAL HIGH (ref 70–99)
Potassium: 4.1 mmol/L (ref 3.5–5.2)
Sodium: 142 mmol/L (ref 134–144)
eGFR: 109 mL/min/{1.73_m2} (ref >59)

## 2021-10-18 ENCOUNTER — Ambulatory Visit
Admission: RE | Admit: 2021-10-18 | Discharge: 2021-10-18 | Disposition: A | Discharge status: Home / Self Care | Payer: Medicaid Other | Source: Ambulatory Visit | Attending: Family Medicine | Admitting: Family Medicine

## 2021-10-18 ENCOUNTER — Other Ambulatory Visit: Payer: Self-pay

## 2021-10-18 DIAGNOSIS — Z1231 Encounter for screening mammogram for malignant neoplasm of breast: Secondary | ICD-10-CM

## 2021-10-19 ENCOUNTER — Telehealth: Payer: Self-pay

## 2021-10-19 ENCOUNTER — Encounter: Payer: Self-pay | Admitting: Physician Assistant

## 2021-10-19 ENCOUNTER — Ambulatory Visit (INDEPENDENT_AMBULATORY_CARE_PROVIDER_SITE_OTHER): Payer: Medicaid Other | Admitting: Physician Assistant

## 2021-10-19 VITALS — BP 124/68 | HR 56 | Ht 62.0 in | Wt 205.5 lb

## 2021-10-19 DIAGNOSIS — R001 Bradycardia, unspecified: Secondary | ICD-10-CM | POA: Diagnosis not present

## 2021-10-19 DIAGNOSIS — K5909 Other constipation: Secondary | ICD-10-CM

## 2021-10-19 DIAGNOSIS — Z1211 Encounter for screening for malignant neoplasm of colon: Secondary | ICD-10-CM | POA: Diagnosis not present

## 2021-10-19 DIAGNOSIS — R1030 Lower abdominal pain, unspecified: Secondary | ICD-10-CM | POA: Diagnosis not present

## 2021-10-19 MED ORDER — LINACLOTIDE 72 MCG PO CAPS: 72.0000 ug | ORAL_CAPSULE | Freq: Every day | ORAL | 5 refills | Status: DC

## 2021-10-19 MED ORDER — NA SULFATE-K SULFATE-MG SULF 17.5-3.13-1.6 GM/177ML PO SOLN: 1.0000 | ORAL | 0 refills | Status: DC

## 2021-10-19 NOTE — Telephone Encounter (Signed)
Please advise from a Cardiac Standpoint if patient would be medically cleared to have Endoscopies under MAC sedation. Procedure are to be determined based off of cardiac clearance.

## 2021-10-19 NOTE — Progress Notes (Signed)
I do not think I am the "supervising" physician for this morning however I am happy to care for her if she prefers for whatever reason.  thanks

## 2021-10-19 NOTE — Patient Instructions (Addendum)
Hemos enviado los siguientes medicamentos a su farmacia para que los recoja a su conveniencia: Suprep y Linzess  Comience con Linzess 72 mcg: tome 1 cpsula por va oral una vez al [employer]. La muestra se ha entregado hoy, as como la receta enviada a la farmacia.  Le han programado una colonoscopia. Siga las instrucciones escritas que se le dieron en su visita de hoy. Recoja sus suministros de preparacin en la farmacia dentro de los prximos 1 a 3 das. Si [place] inhaladores (aunque solo sea necesario), trigalos el da de su procedimiento.  Gracias por elegirnos a m [facility].  Dennison Bulla  _______________________________________________________________  We have sent the following medications to your pharmacy for you to pick up at your convenience: Suprep and [employer] 53mcg- Take 1 capsule by mouth once daily. Sample have been given today as well as prescription sent to the pharmacy.   You have been scheduled for a colonoscopy. Please follow written instructions given to you at your visit today.  Please pick up your prep supplies at the pharmacy within the next 1-3 days. If you use inhalers (even only as needed), please bring them with you on the day of your procedure.  Thank you for choosing me and [facility].  Ellouise Newer -PA

## 2021-10-19 NOTE — Telephone Encounter (Signed)
Patient is OK to have surgery  Low risk from cardiac standpoint

## 2021-10-19 NOTE — Progress Notes (Signed)
Chief Complaint: Constipation, lower abdominal pain and screening for colorectal cancer  HPI:    Debra Diaz is a 45 year old Hispanic female with a past medical history of sinus bradycardia, who was referred to me by Carollee Leitz, MD for a complaint of chronic constipation, lower abdominal pain and need for screening for colorectal cancer.    07/26/2021 seen by cardiology for her sinus bradycardia she had an echo ordered.  It was noted she was asymptomatic with minimal chest pain.  Also history of atrial myxoma repair.  Stable.    08/11/2021 echo with an LVEF 60-65% and minimally enlarged chambers of her heart.    Today, patient presents to clinic accompanied by interpreter who assists with her history.  She explains that she has always been constipated and had a "rough time" with it for many years.  Typically she will go at least 3 to 4 days without a bowel movement and becomes very uncomfortable in between with a lot of lower abdominal pain.  She has tried a lot of over-the-counter products including daily MiraLAX up to 3 doses a day for 4 months which really did not work for her.  She occasionally uses over-the-counter enemas and other over-the-counter products, she even "drank some water with dish detergent" at one point because she was so desperate, which made her very sick.    Also asked about a screening colonoscopy.    Denies fever, chills, weight loss, blood in her stool, nausea or vomiting.  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    Past Surgical History:  Procedure Laterality Date   [place] and 2011   x 2   LEFT HEART CATHETERIZATION WITH CORONARY ANGIOGRAM N/A 03/02/2015   Procedure: LEFT HEART CATHETERIZATION WITH CORONARY ANGIOGRAM;  Surgeon: [name], MD;  Location: [facility]  CATH LAB;  Service: Cardiovascular;  Laterality: N/A;   MINIMALLY INVASIVE EXCISION OF ATRIAL MYXOMA N/A 03/04/2015   Procedure: MINIMALLY INVASIVE RESECTION OF LEFT ATRIAL MYXOMA ( USING A BILAYER PATCH CLOSURE);  Surgeon: [name], MD;  Location: [facility];  Service: Open Heart Surgery;  Laterality: N/A;   myxoma N/A    chest   TEE WITHOUT CARDIOVERSION N/A 03/01/2015   Procedure: TRANSESOPHAGEAL ECHOCARDIOGRAM (TEE);  Surgeon: [name], MD;  Location: [facility] ENDOSCOPY;  Service: Cardiovascular;  Laterality: N/A;   TEE WITHOUT CARDIOVERSION N/A 03/04/2015   Procedure: TRANSESOPHAGEAL ECHOCARDIOGRAM (TEE);  Surgeon: [name], MD;  Location: [facility];  Service: Open Heart Surgery;  Laterality: N/A;   TUBAL LIGATION  2011    Current Outpatient Medications  Medication Sig Dispense Refill   ACCU-CHEK GUIDE test strip USE TO TEST FOUR TIMES DAILY AS DIRECTED 350 strip 1   aspirin 81 MG EC tablet Take 1 tablet (81 mg total) by mouth daily. 90 tablet 3   atorvastatin (LIPITOR) 40 MG tablet Take 1 tablet (40 mg total) by mouth daily. TAKE 1 TABLET(40 MG) BY MOUTH DAILY AT 6 PM 90 tablet 3   blood glucose meter kit and supplies KIT Dispense based on patient and insurance preference. Use up to four times daily as directed. (FOR ICD-9 250.00, 250.01). 1 each 0   Blood Pressure KIT Check blood pressure twice a day.  Dx code: I10 1 kit  0   empagliflozin (JARDIANCE) 10 MG TABS tablet Take 1 tablet (10 mg total) by mouth daily. 90 tablet 0   ferrous sulfate 325 (65 FE) MG EC tablet Take 1 tablet (325 mg total) by mouth daily with breakfast. 90 tablet 0   losartan-hydrochlorothiazide (HYZAAR) 50-12.5 MG tablet Take 1 tablet by mouth daily. 90 tablet    metFORMIN (GLUCOPHAGE-XR) 500 MG 24 hr tablet Take 1 tablet (500 mg total) by mouth in the morning and at bedtime. 180 tablet 3   polyethylene glycol powder (GLYCOLAX/MIRALAX) 17 GM/SCOOP powder Start with one scoop daily, increase to two scoops daily  as needed for soft bowel movement each day. 500 g 0   potassium chloride (KLOR-CON) 10 MEQ tablet TAKE 1 TABLET BY MOUTH ONCE WEEKLY SAME DAY AS DAY AS FUROSEMIDE 15 tablet 3   Vitamin D, Cholecalciferol, 25 MCG (1000 UT) TABS Take 1,000 Units by mouth daily.     No current facility-administered medications for this visit.    Allergies as of 10/19/2021   (No Known Allergies)    Family History  Problem Relation Age of Onset   Hypertension Mother    Heart disease Mother    Diabetes Father    Breast cancer Maternal Grandmother    Asthma Son     Social History   Socioeconomic History   Marital status: Married    Spouse name: Not on file   Number of children: Not on file   Years of education: Not on file   Highest education level: Not on file  Occupational History   Not on file  Tobacco Use   Smoking status: Former    Types: Cigarettes    Quit date: 11/17/2017    Years since quitting: 3.9   Smokeless tobacco: Never  Vaping Use   Vaping Use: Never used  Substance and Sexual Activity   Alcohol use: Yes    Comment: occasional   Drug use: No   Sexual activity: Yes    Birth control/protection: Surgical  Other Topics Concern   Not on file  Social History Narrative   Not on file   Social Determinants of Health   Financial Resource Strain: Not on file  Food Insecurity: Not on file  Transportation Needs: Not on file  Physical Activity: Not on file  Stress: Not on file  Social Connections: Not on file  Intimate Partner Violence: Not on file    Review of Systems:    Constitutional: No weight loss, fever or chills Skin: No rash Cardiovascular: No chest pain Respiratory: No SOB Gastrointestinal: See HPI and otherwise negative Genitourinary: No dysuria  Neurological: No headache, dizziness or syncope Musculoskeletal: No new muscle or joint pain Hematologic: No bleeding Psychiatric: No history of depression or anxiety   Physical Exam:  Vital signs: BP 124/68    Pulse (!) 56   Ht 5' 2"  (1.575 m)   Wt 205 lb 8 oz (93.2 kg)   BMI 37.59 kg/m   Constitutional:   Pleasant overweight Hispanic female appears to be in NAD, Well developed, Well nourished, alert and cooperative Head:  Normocephalic and atraumatic. Eyes:   PEERL, EOMI. No icterus. Conjunctiva pink. Ears:  Normal auditory acuity. Neck:  Supple Throat: Oral cavity and pharynx without inflammation, swelling or lesion.  Respiratory: Respirations even and unlabored. Lungs clear to auscultation bilaterally.   No wheezes, crackles, or rhonchi.  Cardiovascular: Normal S1, S2. No MRG. Regular rate and rhythm. No peripheral edema, cyanosis or pallor.  Gastrointestinal:  Soft,  nondistended, mild bilateral lower TTP, no rebound or guarding. Normal bowel sounds. No appreciable masses or hepatomegaly. Rectal:  Not performed.  Msk:  Symmetrical without gross deformities. Without edema, no deformity or joint abnormality.  Neurologic:  Alert and  oriented x4;  grossly normal neurologically.  Skin:   Dry and intact without significant lesions or rashes. Psychiatric: Demonstrates good judgement and reason without abnormal affect or behaviors.  RELEVANT LABS AND IMAGING: CBC    Component Value Date/Time   WBC 10.7 07/26/2021 1034   WBC 11.2 (H) 06/01/2021 1408   RBC 4.89 07/26/2021 1034   RBC 4.67 06/01/2021 1408   HGB 13.9 07/26/2021 1034   HCT 43.4 07/26/2021 1034   PLT 329 07/26/2021 1034   MCV 89 07/26/2021 1034   MCH 28.4 07/26/2021 1034   MCH 28.5 06/01/2021 1408   MCHC 32.0 07/26/2021 1034   MCHC 32.8 06/01/2021 1408   RDW 13.4 07/26/2021 1034   LYMPHSABS 2.7 05/01/2021 1250   MONOABS 0.7 05/01/2021 1250   EOSABS 0.1 05/01/2021 1250   BASOSABS 0.0 05/01/2021 1250    CMP     Component Value Date/Time   NA 142 10/10/2021 0947   K 4.1 10/10/2021 0947   CL 102 10/10/2021 0947   CO2 25 10/10/2021 0947   GLUCOSE 120 (H) 10/10/2021 0947   GLUCOSE 197 (H) 06/01/2021 1408   BUN 15  10/10/2021 0947   CREATININE 0.66 10/10/2021 0947   CREATININE 0.64 09/01/2016 1607   CALCIUM 9.5 10/10/2021 0947   PROT 7.8 10/30/2018 1743   ALBUMIN 4.4 10/30/2018 1743   AST 28 10/30/2018 1743   ALT 24 10/30/2018 1743   ALKPHOS 78 10/30/2018 1743   BILITOT 0.4 10/30/2018 1743   GFRNONAA >60 06/01/2021 1408   GFRNONAA >89 09/01/2016 1607   GFRAA 125 11/18/2020 1126   GFRAA >89 09/01/2016 1607    Assessment: 1.  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  Bilateral lower abdominal pain: With above 3.  Screening for colorectal cancer: Patient has never had a screening colonoscopy and would like to have one done 4. Sinus bradycardia  Plan: 1.  Provided the patient with samples of Linzess 72 mcg daily, 30 minutes before breakfast.  Also sent in prescription #30 with 5 refills. 2.  Scheduled patient for screening colonoscopy in the [place] with Dr. Ardis Hughs.  Did provide the patient a detailed list of risks for the procedure and she agrees to proceed. Patient is appropriate for endoscopic procedure(s) in the ambulatory ([facility]) setting.  She was given a 2-day bowel prep given her history of constipation. 3.  We will send cardiac clearance to Dr. Burt Knack given her history of sinus bradycardia to ensure that anesthesia is acceptable for her. 4.  Patient to follow in clinic per recommendations from Dr. Ardis Hughs after time of procedure.  Ellouise Newer, PA-C [facility] 10/19/2021, 9:13 AM  Cc: Carollee Leitz, MD

## 2021-10-26 ENCOUNTER — Encounter (HOSPITAL_COMMUNITY): Payer: Self-pay | Admitting: Emergency Medicine

## 2021-10-26 ENCOUNTER — Emergency Department (HOSPITAL_COMMUNITY): Payer: Medicaid Other

## 2021-10-26 ENCOUNTER — Emergency Department (HOSPITAL_COMMUNITY)
Admission: EM | Admit: 2021-10-26 | Discharge: 2021-10-27 | Disposition: A | Discharge status: Home / Self Care | Payer: Medicaid Other | Attending: Emergency Medicine | Admitting: Emergency Medicine

## 2021-10-26 DIAGNOSIS — R0602 Shortness of breath: Secondary | ICD-10-CM | POA: Insufficient documentation

## 2021-10-26 DIAGNOSIS — R030 Elevated blood-pressure reading, without diagnosis of hypertension: Secondary | ICD-10-CM | POA: Diagnosis not present

## 2021-10-26 DIAGNOSIS — Z5321 Procedure and treatment not carried out due to patient leaving prior to being seen by health care provider: Secondary | ICD-10-CM | POA: Diagnosis not present

## 2021-10-26 DIAGNOSIS — H538 Other visual disturbances: Secondary | ICD-10-CM | POA: Insufficient documentation

## 2021-10-26 DIAGNOSIS — R0789 Other chest pain: Secondary | ICD-10-CM | POA: Diagnosis not present

## 2021-10-26 LAB — CBC WITH DIFFERENTIAL/PLATELET
Abs Immature Granulocytes: 0.06 10*3/uL (ref 0.00–0.07)
Basophils Absolute: 0.1 10*3/uL (ref 0.0–0.1)
Basophils Relative: 1 %
Eosinophils Absolute: 0.2 10*3/uL (ref 0.0–0.5)
Eosinophils Relative: 1 %
HCT: 45.2 % (ref 36.0–46.0)
Hemoglobin: 14.5 g/dL (ref 12.0–15.0)
Immature Granulocytes: 1 %
Lymphocytes Relative: 33 %
Lymphs Abs: 4.2 10*3/uL — ABNORMAL HIGH (ref 0.7–4.0)
MCH: 28 pg (ref 26.0–34.0)
MCHC: 32.1 g/dL (ref 30.0–36.0)
MCV: 87.4 fL (ref 80.0–100.0)
Monocytes Absolute: 1 10*3/uL (ref 0.1–1.0)
Monocytes Relative: 8 %
Neutro Abs: 7.3 10*3/uL (ref 1.7–7.7)
Neutrophils Relative %: 56 %
Platelets: 312 10*3/uL (ref 150–400)
RBC: 5.17 MIL/uL — ABNORMAL HIGH (ref 3.87–5.11)
RDW: 13.5 % (ref 11.5–15.5)
WBC: 12.8 10*3/uL — ABNORMAL HIGH (ref 4.0–10.5)
nRBC: 0 % (ref 0.0–0.2)

## 2021-10-26 LAB — COMPREHENSIVE METABOLIC PANEL
ALT: 115 U/L — ABNORMAL HIGH (ref 0–44)
AST: 79 U/L — ABNORMAL HIGH (ref 15–41)
Albumin: 3.5 g/dL (ref 3.5–5.0)
Alkaline Phosphatase: 121 U/L (ref 38–126)
Anion gap: 8 (ref 5–15)
BUN: 15 mg/dL (ref 6–20)
CO2: 29 mmol/L (ref 22–32)
Calcium: 9.3 mg/dL (ref 8.9–10.3)
Chloride: 102 mmol/L (ref 98–111)
Creatinine, Ser: 0.67 mg/dL (ref 0.44–1.00)
GFR, Estimated: >60 mL/min (ref >60)
Glucose, Bld: 141 mg/dL — ABNORMAL HIGH (ref 70–99)
Potassium: 3.6 mmol/L (ref 3.5–5.1)
Sodium: 139 mmol/L (ref 135–145)
Total Bilirubin: 0.5 mg/dL (ref 0.3–1.2)
Total Protein: 7 g/dL (ref 6.5–8.1)

## 2021-10-26 LAB — TROPONIN I (HIGH SENSITIVITY): Troponin I (High Sensitivity): 7 ng/L (ref <18)

## 2021-10-26 NOTE — ED Triage Notes (Signed)
Pt here from home for HTN w/ blurry vision and headache, and some chest tightness x1 hour. Pt is on BP meds, med compliant. States this happens when BP gets high

## 2021-10-26 NOTE — ED Provider Notes (Signed)
Emergency Medicine Provider Triage Evaluation Note  Debra Diaz , a 45 y.o. female  was evaluated in triage.  Pt complains of high blood pressure. States today that she was measuring her blood pressure at home and it was 189/95.  She has been having high blood pressures a lot lately.  She says that she will intermittently have blurry vision, chest tightness, shortness of breath and her blood pressure is high.  Right now she is no longer having the symptoms.  She states that she is on a blood pressure medication, but she does not know which one it is.  Review looks like it is losartan-hydrochlorothiazide.  She is taking this as prescribed. Review of Systems  Positive: Chest tightness, shortness of breath, blurry vision Negative: Chest pain, abdominal pain, n/v  Physical Exam  BP (!) 171/99   Pulse 63   Temp 98.7 F (37.1 C) (Oral)   Resp 18   SpO2 99%  Gen:   Awake, no distress   Resp:  Normal effort. CTA bilaterally MSK:   Moves extremities without difficulty  Other:  Heart regular rate and rhythm, no murmurs  Medical Decision Making  Medically screening exam initiated at 8:28 PM.  Appropriate orders placed.  Debra Diaz was informed that the remainder of the evaluation will be completed by another provider, this initial triage assessment does not replace that evaluation, and the importance of remaining in the ED until their evaluation is complete.     Debra Diaz 10/26/21 2030    Debra Rank, MD 10/26/21 2242

## 2021-11-22 ENCOUNTER — Ambulatory Visit (AMBULATORY_SURGERY_CENTER): Payer: Medicaid Other | Admitting: Gastroenterology

## 2021-11-22 ENCOUNTER — Encounter: Payer: Self-pay | Admitting: Gastroenterology

## 2021-11-22 VITALS — BP 108/57 | HR 60 | Temp 97.1°F | Resp 12 | Ht 62.0 in | Wt 205.0 lb

## 2021-11-22 DIAGNOSIS — K5909 Other constipation: Secondary | ICD-10-CM

## 2021-11-22 DIAGNOSIS — D122 Benign neoplasm of ascending colon: Secondary | ICD-10-CM

## 2021-11-22 MED ORDER — LINACLOTIDE 145 MCG PO CAPS: 145.0000 ug | ORAL_CAPSULE | Freq: Every day | ORAL | Status: DC

## 2021-11-22 MED ORDER — SODIUM CHLORIDE 0.9 % IV SOLN: 500.0000 mL | Freq: Once | INTRAVENOUS | Status: DC

## 2021-11-22 NOTE — Progress Notes (Signed)
VS by [place]. Interpreter, Derenda Mis, present during admission.

## 2021-11-22 NOTE — Progress Notes (Signed)
HPI: This is a chronic constipation   ROS: complete GI ROS as described in HPI, all other review negative.  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    Past Surgical History:  Procedure Laterality Date   [place] and 2011   x 2   LEFT HEART CATHETERIZATION WITH CORONARY ANGIOGRAM N/A 03/02/2015   Procedure: LEFT HEART CATHETERIZATION WITH CORONARY ANGIOGRAM;  Surgeon: [name], MD;  Location: [facility] CATH LAB;  Service: Cardiovascular;  Laterality: N/A;   MINIMALLY INVASIVE EXCISION OF ATRIAL MYXOMA N/A 03/04/2015   Procedure: MINIMALLY INVASIVE RESECTION OF LEFT ATRIAL MYXOMA ( USING A BILAYER PATCH CLOSURE);  Surgeon: [name], MD;  Location: [place];  Service: Open Heart Surgery;  Laterality: N/A;   myxoma N/A    chest   TEE WITHOUT CARDIOVERSION N/A 03/01/2015   Procedure: TRANSESOPHAGEAL ECHOCARDIOGRAM (TEE);  Surgeon: [name], MD;  Location: [facility] ENDOSCOPY;  Service: Cardiovascular;  Laterality: N/A;   TEE WITHOUT CARDIOVERSION N/A 03/04/2015   Procedure: TRANSESOPHAGEAL ECHOCARDIOGRAM (TEE);  Surgeon: [name], MD;  Location: [place];  Service: Open Heart Surgery;  Laterality: N/A;   TUBAL LIGATION  2011    Current Outpatient Medications  Medication Sig Dispense Refill   ACCU-CHEK GUIDE test strip USE TO TEST FOUR TIMES DAILY AS DIRECTED 350 strip 1   aspirin 81 MG EC tablet Take 1 tablet (81 mg total) by mouth daily. 90 tablet 3   blood glucose meter kit and supplies KIT Dispense based on patient and insurance preference. Use up to four times daily as directed. (FOR ICD-9 250.00, 250.01). 1 each 0   Blood Pressure KIT Check blood pressure twice a day.   Dx code: I10 1 kit 0   ferrous sulfate 325 (65 FE) MG EC tablet Take 1 tablet (325 mg total) by mouth daily with breakfast. 90 tablet 0   losartan-hydrochlorothiazide (HYZAAR) 50-12.5 MG tablet Take 1 tablet by mouth daily. 90 tablet    metFORMIN (GLUCOPHAGE-XR) 500 MG 24 hr tablet Take 1 tablet (500 mg total) by mouth in the morning and at bedtime. 180 tablet 3   Vitamin D, Cholecalciferol, 25 MCG (1000 UT) TABS Take 1,000 Units by mouth daily.     atorvastatin (LIPITOR) 40 MG tablet Take 1 tablet (40 mg total) by mouth daily. TAKE 1 TABLET(40 MG) BY MOUTH DAILY AT 6 PM 90 tablet 3   linaclotide (LINZESS) 72 MCG capsule Take 1 capsule (72 mcg total) by mouth daily before breakfast. 30 capsule 5   polyethylene glycol powder (GLYCOLAX/MIRALAX) 17 GM/SCOOP powder Start with one scoop daily, increase to two scoops daily as needed for soft bowel movement each day. 500 g 0   potassium chloride (KLOR-CON) 10 MEQ tablet TAKE 1 TABLET BY MOUTH ONCE WEEKLY SAME DAY AS DAY AS FUROSEMIDE 15 tablet 3   Current Facility-Administered Medications  Medication Dose Route Frequency Provider Last Rate Last Admin   0.9 %  sodium chloride infusion  500 mL Intravenous Once Milus Banister, MD        Allergies as of 11/22/2021   (No Known Allergies)    Family History  Problem  Relation Age of Onset   Hypertension Mother    Heart disease Mother    Diabetes Father    Breast cancer Maternal Grandmother    Stomach cancer Maternal Grandfather    Asthma Son    Colon cancer Neg Hx    Rectal cancer Neg Hx     Social History   Socioeconomic History   Marital status: Married    Spouse name: Not on file   Number of children: Not on file   Years of education: Not on file   Highest education level: Not on file  Occupational History   Not on file  Tobacco Use   Smoking status: Former    Types: Cigarettes    Quit date: 11/17/2017    Years since quitting: 4.0   Smokeless tobacco: Never  Vaping Use   Vaping  Use: Never used  Substance and Sexual Activity   Alcohol use: Yes    Comment: occasional   Drug use: No   Sexual activity: Yes    Birth control/protection: Surgical  Other Topics Concern   Not on file  Social History Narrative   Not on file   Social Determinants of Health   Financial Resource Strain: Not on file  Food Insecurity: Not on file  Transportation Needs: Not on file  Physical Activity: Not on file  Stress: Not on file  Social Connections: Not on file  Intimate Partner Violence: Not on file     Physical Exam: BP (!) 164/73   Pulse (!) 46   Temp (!) 97.1 F (36.2 C)   Ht 5' 2"  (1.575 m)   Wt 205 lb (93 kg)   SpO2 99%   BMI 37.49 kg/m  Constitutional: generally well-appearing Psychiatric: alert and oriented x3 Lungs: CTA bilaterally Heart: no MCR  Assessment and plan: 45 y.o. female with chronic constipation, screening  Colonoscopy today  Care is appropriate for the ambulatory setting.  Owens Loffler, MD [facility] 11/22/2021, 3:18 PM

## 2021-11-22 NOTE — Patient Instructions (Addendum)
  We are sending a prescription to your pharmacy.  You till take one pill daily to help with constipation. Take every day 30 minutes before the first meal of the day.     USTED TUVO UN PROCEDIMIENTO ENDOSCPICO HOY EN EL [facility]:   Lea el informe del procedimiento que se le entreg para cualquier pregunta especfica sobre lo que se Primary school teacher.  Si el informe del examen no responde a sus preguntas, por favor llame a su gastroenterlogo para aclararlo.  Si usted solicit que no se le den [employer] de lo que se Estate manager/land agent en su procedimiento al [employer] va a cuidar, entonces el informe del procedimiento se ha incluido en un sobre sellado para que usted lo revise despus cuando le sea ms conveniente.   LO QUE PUEDE ESPERAR: Algunas sensaciones de hinchazn en el abdomen.  Puede tener ms gases de lo normal.  El caminar puede ayudarle a eliminar el aire que se le puso en el tracto gastrointestinal durante el procedimiento y reducir la hinchazn.  Si le hicieron una endoscopia inferior (como una colonoscopia o una sigmoidoscopia flexible), podra notar manchas de sangre en las heces fecales o en el papel higinico.  Si se someti a una preparacin intestinal para su procedimiento, es posible que no tenga una evacuacin intestinal normal durante [employer].   Tenga en cuenta:  Es posible que note un poco de irritacin y congestin en la nariz o algn drenaje.  Esto es debido al oxgeno [employer] durante su procedimiento.  No hay que preocuparse y esto debe desaparecer ms o Scientist, research (medical).   SNTOMAS PARA REPORTAR INMEDIATAMENTE:  Despus de una endoscopia inferior (colonoscopia o sigmoidoscopia flexible):  Cantidades excesivas de sangre en las heces fecales  Sensibilidad significativa o empeoramiento de los dolores abdominales   Hinchazn aguda del abdomen que antes no tena   Fiebre de 100F o ms      Para asuntos urgentes o de Freight forwarder, puede  comunicarse con un gastroenterlogo a cualquier hora llamando al [phone].  DIETA:  Recomendamos una comida pequea al principio, pero luego puede continuar con su dieta normal.  Tome muchos lquidos, Teacher, adult education las bebidas alcohlicas durante 24 horas.    ACTIVIDAD:  Debe planear tomarse las cosas con calma por el resto del da y no debe CONDUCIR ni usar maquinaria pesada Programmer, applications (debido a los medicamentos de sedacin utilizados durante el examen).     SEGUIMIENTO: Nuestro personal llamar al nmero que aparece en su historial al siguiente da hbil de su procedimiento para ver cmo se siente y para responder cualquier pregunta o inquietud que pueda tener con respecto a la informacin que se le dio despus del procedimiento. Si no podemos contactarle, le dejaremos un mensaje.  Sin embargo, si se siente bien y no tiene Paediatric nurse, no es necesario que nos devuelva la llamada.  Asumiremos que ha regresado a sus actividades diarias normales sin incidentes. Si se le tomaron algunas biopsias, le contactaremos por telfono o por carta en las prximas 3 semanas.  Si no ha sabido [employer] biopsias en el transcurso de 3 semanas, por favor llmenos al [phone].   FIRMAS/CONFIDENCIALIDAD: Usted y/o el acompaante que le cuide han firmado documentos que se ingresarn en su historial mdico electrnico.  Estas firmas atestiguan el hecho de que la informacin anterior

## 2021-11-22 NOTE — Progress Notes (Signed)
Called to room to assist during endoscopic procedure.  Patient ID and intended procedure confirmed with present staff. Received instructions for my participation in the procedure from the performing physician.  

## 2021-11-22 NOTE — Op Note (Signed)
[facility] Patient Name: Genelle Economou Procedure Date: 11/22/2021 3:15 PM MRN: [id number] Endoscopist: Milus Banister , MD Age: 45 Referring MD:  Date of Birth: August 11, 1976 Gender: Female Account #: [id number] Procedure:                Colonoscopy Indications:              Screening for colorectal malignant neoplasm, also                            mild chronic constipation (years) Medicines:                Monitored Anesthesia Care Procedure:                Pre-Anesthesia Assessment:                           - Prior to the procedure, a History and Physical                            was performed, and patient medications and                            allergies were reviewed. The patient's tolerance of                            previous anesthesia was also reviewed. The risks                            and benefits of the procedure and the sedation                            options and risks were discussed with the patient.                            All questions were answered, and informed consent                            was obtained. Prior Anticoagulants: The patient has                            taken no previous anticoagulant or antiplatelet                            agents. ASA Grade Assessment: II - A patient with                            mild systemic disease. After reviewing the risks                            and benefits, the patient was deemed in                            satisfactory condition to undergo the procedure.  After obtaining informed consent, the colonoscope                            was passed under direct vision. Throughout the                            procedure, the patient's blood pressure, pulse, and                            oxygen saturations were monitored continuously. The                            Olympus CF-HQ190L [id number]) Colonoscope was                            introduced  through the anus and advanced to the the                            cecum, identified by appendiceal orifice and                            ileocecal valve. The colonoscopy was performed                            without difficulty. The patient tolerated the                            procedure well. The quality of the bowel                            preparation was good. The ileocecal valve,                            appendiceal orifice, and rectum were photographed. Scope In: 3:27:44 PM Scope Out: 3:38:01 PM Scope Withdrawal Time: 0 hours 6 minutes 45 seconds  Total Procedure Duration: 0 hours 10 minutes 17 seconds  Findings:                 A 3 mm polyp was found in the ascending colon. The                            polyp was sessile. The polyp was removed with a                            cold snare. Resection and retrieval were complete.                           The exam was otherwise without abnormality on                            direct and retroflexion views. Complications:            No immediate complications. Estimated blood loss:  None. Estimated Blood Loss:     Estimated blood loss: none. Impression:               - One 3 mm polyp in the ascending colon, removed                            with a cold snare. Resected and retrieved.                           - The examination was otherwise normal on direct                            and retroflexion views. Recommendation:           - Patient has a contact number available for                            emergencies. The signs and symptoms of potential                            delayed complications were discussed with the                            patient. Return to normal activities tomorrow.                            Written discharge instructions were provided to the                            patient.                           - Resume previous diet.                           - Continue  present medications. New prescripition                            called in today: linzess 169mcg, take one pill once                            daily for your constipation. Disp 30 with 11                            refills.                           - Await pathology results. Milus Banister, MD 11/22/2021 3:41:37 PM This report has been signed electronically.

## 2021-11-22 NOTE — Progress Notes (Signed)
Report given to PACU, vss 

## 2021-11-24 ENCOUNTER — Telehealth: Payer: Self-pay

## 2021-11-24 NOTE — Telephone Encounter (Signed)
  Follow up Call-  Call back number 11/22/2021  Post procedure Call Back phone  # [phone]  Permission to leave phone message Yes  Some recent data might be hidden     Patient questions:  Do you have a fever, pain , or abdominal swelling? No. Pain Score  0 *  Have you tolerated food without any problems? Yes.    Have you been able to return to your normal activities? Yes.    Do you have any questions about your discharge instructions: Diet   No. Medications  No. Follow up visit  No.  Do you have questions or concerns about your Care? No.  Actions: * If pain score is 4 or above: No action needed, pain <4.

## 2021-11-28 ENCOUNTER — Encounter: Payer: Self-pay | Admitting: Gastroenterology

## 2021-12-15 ENCOUNTER — Telehealth: Payer: Self-pay | Admitting: Gastroenterology

## 2021-12-15 MED ORDER — LINACLOTIDE 145 MCG PO CAPS: 145.0000 ug | ORAL_CAPSULE | Freq: Every day | ORAL | 11 refills | Status: DC

## 2021-12-15 NOTE — Telephone Encounter (Signed)
Sent script for Linzess into pharmacy.

## 2021-12-15 NOTE — Telephone Encounter (Signed)
Patient called requesting a prescription for the Linzess.  She said she went to her pharmacy to pick it up, but they had no record of it being called in for her.  She uses the [facility] on [address].  If there is an issue with this, please reach out to patient.  Thank you.

## 2021-12-15 NOTE — Telephone Encounter (Signed)
Pt called again inquiring about Linzess as [facility] has not received prescription yet.

## 2021-12-26 ENCOUNTER — Other Ambulatory Visit: Payer: Self-pay

## 2021-12-26 ENCOUNTER — Other Ambulatory Visit (HOSPITAL_COMMUNITY)
Admission: RE | Admit: 2021-12-26 | Discharge: 2021-12-26 | Disposition: A | Discharge status: Home / Self Care | Payer: Medicaid Other | Source: Ambulatory Visit | Attending: Family Medicine | Admitting: Family Medicine

## 2021-12-26 ENCOUNTER — Ambulatory Visit (INDEPENDENT_AMBULATORY_CARE_PROVIDER_SITE_OTHER): Payer: Medicaid Other | Admitting: Family Medicine

## 2021-12-26 VITALS — BP 138/60 | HR 48 | Wt 208.2 lb

## 2021-12-26 DIAGNOSIS — Z113 Encounter for screening for infections with a predominantly sexual mode of transmission: Secondary | ICD-10-CM | POA: Diagnosis not present

## 2021-12-26 DIAGNOSIS — Z1159 Encounter for screening for other viral diseases: Secondary | ICD-10-CM

## 2021-12-26 DIAGNOSIS — N898 Other specified noninflammatory disorders of vagina: Secondary | ICD-10-CM

## 2021-12-26 DIAGNOSIS — N912 Amenorrhea, unspecified: Secondary | ICD-10-CM | POA: Diagnosis not present

## 2021-12-26 HISTORY — DX: Encounter for screening for infections with a predominantly sexual mode of transmission: Z11.3

## 2021-12-26 HISTORY — DX: Other specified noninflammatory disorders of vagina: N89.8

## 2021-12-26 LAB — POCT GLYCOSYLATED HEMOGLOBIN (HGB A1C): HbA1c, POC (controlled diabetic range): 8.9 % — AB (ref 0.0–7.0)

## 2021-12-26 LAB — POCT WET PREP (WET MOUNT)
Clue Cells Wet Prep Whiff POC: NEGATIVE
Trichomonas Wet Prep HPF POC: ABSENT
WBC, Wet Prep HPF POC: >20

## 2021-12-26 LAB — POCT URINALYSIS DIP (MANUAL ENTRY)
Bilirubin, UA: NEGATIVE
Glucose, UA: NEGATIVE mg/dL
Ketones, POC UA: NEGATIVE mg/dL
Nitrite, UA: NEGATIVE
Protein Ur, POC: NEGATIVE mg/dL
Spec Grav, UA: 1.01 (ref 1.010–1.025)
Urobilinogen, UA: 0.2 E.U./dL
pH, UA: 7 (ref 5.0–8.0)

## 2021-12-26 LAB — POCT UA - MICROSCOPIC ONLY

## 2021-12-26 LAB — POCT URINE PREGNANCY: Preg Test, Ur: NEGATIVE

## 2021-12-26 MED ORDER — ACCU-CHEK GUIDE VI STRP: ORAL_STRIP | 1 refills | Status: DC

## 2021-12-26 MED ORDER — METFORMIN HCL ER 500 MG PO TB24: 1000.0000 mg | ORAL_TABLET | Freq: Two times a day (BID) | ORAL | 3 refills | Status: DC

## 2021-12-26 NOTE — Progress Notes (Signed)
° ° °  SUBJECTIVE:   CHIEF COMPLAINT / HPI:   Vaginal itching Reports that has been going on for 1 week.  Notices it is on the inside as well as the outside.  Has not noticed any excess discharge.  Did use over-the-counter and a yeast medication without any improvement.  Reports that LMP was in April of last year.  History of a tubal.  Reports that she was last sexually active in September of this year.  Has not been tested for any STDs since being sexually active.  Diabetes Patient currently on metformin.  Most recent hemoglobin A1c was 8.1.  Is currently taking metformin 500 mg once daily.  Reports that she took her sugar yesterday and glucose was 350.  Reports considerable increase in urination over the past few weeks.  Denies any other symptoms of hyperglycemia.   OBJECTIVE:   BP 138/60    Pulse (!) 48    Wt 208 lb 3.2 oz (94.4 kg)    SpO2 99%    BMI 38.08 kg/m   General: Pleasant 46 year old female in no acute distress Cardiac: Regular rate and rhythm, no murmurs appreciated Respiratory: Breathing, speaking in full sentences Abdomen: Soft, nontender GU: Normal external female genitalia, patient reports itching around the introitus but no excoriations or erythema noted, normal internal vaginal tissue with no abnormal discharge, mild cervical ectopy noted, wet prep collected  ASSESSMENT/PLAN:   Screening for STD (sexually transmitted disease) Last sexually active approximately 4 months ago.  Screening for STDs today including RPR and HIV.  We will follow-up on results and treat as needed.  Vaginal itching Patient is showing signs of menopause.  Could be caused by atrophic vaginitis.  Infectious origin also possible.  Screening for infectious organisms.  Recommended moisturizing treatment and strict return and ED precautions given.  Amenorrhea No menstrual period since April of last year.  She has been sexually active without protection but reports she has had a tubal in the past.  We  will collect pregnancy test today but most likely patient is perimenopausal.     Gifford Shave, MD [place]

## 2021-12-26 NOTE — Assessment & Plan Note (Signed)
No menstrual period since April of last year.  She has been sexually active without protection but reports she has had a tubal in the past.  We will collect pregnancy test today but most likely patient is perimenopausal.

## 2021-12-26 NOTE — Patient Instructions (Signed)
It was a pleasure seeing you today!  I am sorry you are having this itching.  I believe it may be due to changes in your hormones with menopause.  I want you to use a moisturizing lotion which she can get over-the-counter called Vagisil moisturizing lotion.  You will use this 2-3 times a week.  We are still testing for other infections and those results will come back in a few days.  Regarding your diabetes your hemoglobin A1c was 8.9 today.  I want you to increase your metformin to taking 2 tablets twice daily but you will need to titrate up to this.  For the next 4 days I would like you to take 1 tablet in the morning and 1 tablet at night.  Starting on 1/14 I would like you to take 2 tablets in the morning and 1 tablet at night.  Starting on 1/18 I would like you to take 2 tablets in the morning and 2 tablets at night and continue on this.  You will need to follow-up on your diabetes in 3 months.  If you have any questions or concerns call the clinic.  I hope you have a wonderful afternoon!

## 2021-12-26 NOTE — Assessment & Plan Note (Signed)
Patient is showing signs of menopause.  Could be caused by atrophic vaginitis.  Infectious origin also possible.  Screening for infectious organisms.  Recommended moisturizing treatment and strict return and ED precautions given.

## 2021-12-26 NOTE — Assessment & Plan Note (Signed)
Last sexually active approximately 4 months ago.  Screening for STDs today including RPR and HIV.  We will follow-up on results and treat as needed.

## 2021-12-27 LAB — CERVICOVAGINAL ANCILLARY ONLY
Chlamydia: NEGATIVE
Comment: NEGATIVE
Comment: NORMAL
Neisseria Gonorrhea: NEGATIVE

## 2021-12-27 LAB — HIV ANTIBODY (ROUTINE TESTING W REFLEX): HIV Screen 4th Generation wRfx: NONREACTIVE

## 2021-12-27 LAB — RPR: RPR Ser Ql: NONREACTIVE

## 2021-12-31 ENCOUNTER — Encounter (HOSPITAL_BASED_OUTPATIENT_CLINIC_OR_DEPARTMENT_OTHER): Payer: Self-pay

## 2021-12-31 ENCOUNTER — Emergency Department (HOSPITAL_BASED_OUTPATIENT_CLINIC_OR_DEPARTMENT_OTHER)
Admission: EM | Admit: 2021-12-31 | Discharge: 2021-12-31 | Disposition: A | Discharge status: Home / Self Care | Payer: Medicaid Other | Attending: Emergency Medicine | Admitting: Emergency Medicine

## 2021-12-31 ENCOUNTER — Encounter: Payer: Self-pay | Admitting: Physician Assistant

## 2021-12-31 ENCOUNTER — Other Ambulatory Visit: Payer: Self-pay

## 2021-12-31 DIAGNOSIS — Z7982 Long term (current) use of aspirin: Secondary | ICD-10-CM | POA: Insufficient documentation

## 2021-12-31 DIAGNOSIS — I4892 Unspecified atrial flutter: Secondary | ICD-10-CM | POA: Diagnosis not present

## 2021-12-31 DIAGNOSIS — I10 Essential (primary) hypertension: Secondary | ICD-10-CM | POA: Diagnosis not present

## 2021-12-31 DIAGNOSIS — E119 Type 2 diabetes mellitus without complications: Secondary | ICD-10-CM | POA: Diagnosis not present

## 2021-12-31 DIAGNOSIS — Z7984 Long term (current) use of oral hypoglycemic drugs: Secondary | ICD-10-CM | POA: Insufficient documentation

## 2021-12-31 DIAGNOSIS — R42 Dizziness and giddiness: Secondary | ICD-10-CM | POA: Diagnosis present

## 2021-12-31 LAB — CBC WITH DIFFERENTIAL/PLATELET
Abs Immature Granulocytes: 0.03 10*3/uL (ref 0.00–0.07)
Basophils Absolute: 0 10*3/uL (ref 0.0–0.1)
Basophils Relative: 0 %
Eosinophils Absolute: 0.2 10*3/uL (ref 0.0–0.5)
Eosinophils Relative: 1 %
HCT: 48.8 % — ABNORMAL HIGH (ref 36.0–46.0)
Hemoglobin: 16.1 g/dL — ABNORMAL HIGH (ref 12.0–15.0)
Immature Granulocytes: 0 %
Lymphocytes Relative: 26 %
Lymphs Abs: 2.7 10*3/uL (ref 0.7–4.0)
MCH: 28.2 pg (ref 26.0–34.0)
MCHC: 33 g/dL (ref 30.0–36.0)
MCV: 85.5 fL (ref 80.0–100.0)
Monocytes Absolute: 0.6 10*3/uL (ref 0.1–1.0)
Monocytes Relative: 6 %
Neutro Abs: 6.8 10*3/uL (ref 1.7–7.7)
Neutrophils Relative %: 67 %
Platelets: 283 10*3/uL (ref 150–400)
RBC: 5.71 MIL/uL — ABNORMAL HIGH (ref 3.87–5.11)
RDW: 14.9 % (ref 11.5–15.5)
WBC: 10.4 10*3/uL (ref 4.0–10.5)
nRBC: 0 % (ref 0.0–0.2)

## 2021-12-31 LAB — URINALYSIS, ROUTINE W REFLEX MICROSCOPIC
Bilirubin Urine: NEGATIVE
Glucose, UA: NEGATIVE mg/dL
Ketones, ur: NEGATIVE mg/dL
Leukocytes,Ua: NEGATIVE
Nitrite: NEGATIVE
Protein, ur: NEGATIVE mg/dL
Specific Gravity, Urine: 1.006 (ref 1.005–1.030)
pH: 7 (ref 5.0–8.0)

## 2021-12-31 LAB — COMPREHENSIVE METABOLIC PANEL
ALT: 244 U/L — ABNORMAL HIGH (ref 0–44)
AST: 172 U/L — ABNORMAL HIGH (ref 15–41)
Albumin: 4 g/dL (ref 3.5–5.0)
Alkaline Phosphatase: 125 U/L (ref 38–126)
Anion gap: 10 (ref 5–15)
BUN: 10 mg/dL (ref 6–20)
CO2: 25 mmol/L (ref 22–32)
Calcium: 9.3 mg/dL (ref 8.9–10.3)
Chloride: 100 mmol/L (ref 98–111)
Creatinine, Ser: 0.5 mg/dL (ref 0.44–1.00)
GFR, Estimated: >60 mL/min (ref >60)
Glucose, Bld: 262 mg/dL — ABNORMAL HIGH (ref 70–99)
Potassium: 3.7 mmol/L (ref 3.5–5.1)
Sodium: 135 mmol/L (ref 135–145)
Total Bilirubin: 0.6 mg/dL (ref 0.3–1.2)
Total Protein: 7.4 g/dL (ref 6.5–8.1)

## 2021-12-31 LAB — MAGNESIUM: Magnesium: 1.8 mg/dL (ref 1.7–2.4)

## 2021-12-31 LAB — CBG MONITORING, ED: Glucose-Capillary: 271 mg/dL — ABNORMAL HIGH (ref 70–99)

## 2021-12-31 MED ORDER — LOSARTAN POTASSIUM-HCTZ 50-12.5 MG PO TABS: 1.0000 | ORAL_TABLET | Freq: Every day | ORAL | 1 refills | Status: DC

## 2021-12-31 MED ORDER — METOPROLOL TARTRATE 5 MG/5ML IV SOLN
2.5000 mg | Freq: Once | INTRAVENOUS | Status: AC
  Administered 2021-12-31: 2.5 mg via INTRAVENOUS
  Filled 2021-12-31: qty 5

## 2021-12-31 MED ORDER — APIXABAN 5 MG PO TABS: 5.0000 mg | ORAL_TABLET | Freq: Two times a day (BID) | ORAL | 0 refills | Status: DC

## 2021-12-31 MED ORDER — METOPROLOL TARTRATE 25 MG PO TABS: 25.0000 mg | ORAL_TABLET | Freq: Once | ORAL | 0 refills | Status: DC | PRN

## 2021-12-31 MED ORDER — LACTATED RINGERS IV BOLUS
1000.0000 mL | Freq: Once | INTRAVENOUS | Status: AC
  Administered 2021-12-31: 1000 mL via INTRAVENOUS

## 2021-12-31 NOTE — ED Provider Notes (Signed)
[facility] EMERGENCY DEPT Provider Note   CSN: [id number] Arrival date & time: 12/31/21  5188     History  Chief Complaint  Patient presents with   Dizziness    Debra Diaz is a 46 y.o. female.  Patient is a 46 year old female with a history of diabetes, anemia, hyperlipidemia, prior myxoma resection and 2016 who is presenting today with multiple complaints.  Patient reports since Saturday of last week she has been running very high blood sugars and her blood pressure has been elevated.  This came out of nowhere as she denies any recent illness, medication changes or diet changes.  However her blood sugar she reports is usually 100 and has been up to the 400s.  She saw her doctor a few days ago because of these findings and her metformin was increased from 500 - 1,000 but she reports it still not really helping.  Then yesterday she started having palpitations.  She reports her heart rate is normally between 30 and 40 and she is worn a monitor and seen cardiologist about this and they said that that was just her baseline.  However yesterday when she was monitoring her heart rate was up to 120 she would feel palpitations, slight shortness of breath and dizziness when she would try to do things.  That has been pretty persistent throughout the last 24 hours.  He has never had symptoms like that before.  Because she was not feeling any better today her blood sugar was still in the 200s she came here for further evaluation.  She denies any recent cough, congestion, fever, cold.  She has had no abdominal pain nausea vomiting or diarrhea.  The history is provided by the patient.  Dizziness     Home Medications Prior to Admission medications   Medication Sig Start Date End Date Taking? Authorizing Provider  aspirin 81 MG EC tablet Take 1 tablet (81 mg total) by mouth daily. 10/01/21   Carollee Leitz, MD  atorvastatin (LIPITOR) 40 MG tablet Take 1 tablet (40 mg total) by  mouth daily. TAKE 1 TABLET(40 MG) BY MOUTH DAILY AT 6 PM 06/03/21   Carollee Leitz, MD  blood glucose meter kit and supplies KIT Dispense based on patient and insurance preference. Use up to four times daily as directed. (FOR ICD-9 250.00, 250.01). 06/03/21   Carollee Leitz, MD  Blood Pressure KIT Check blood pressure twice a day.  Dx code: I10 11/18/20   Carollee Leitz, MD  ferrous sulfate 325 (65 FE) MG EC tablet Take 1 tablet (325 mg total) by mouth daily with breakfast. 10/01/21   Carollee Leitz, MD  glucose blood (ACCU-CHEK GUIDE) test strip Use as instructed 12/26/21   Gifford Shave, MD  linaclotide [facility]) 145 MCG CAPS capsule Take 1 capsule (145 mcg total) by mouth daily before breakfast. 12/15/21   Milus Banister, MD  losartan-hydrochlorothiazide (HYZAAR) 50-12.5 MG tablet Take 1 tablet by mouth daily. 10/01/21   Carollee Leitz, MD  metFORMIN (GLUCOPHAGE-XR) 500 MG 24 hr tablet Take 2 tablets (1,000 mg total) by mouth in the morning and at bedtime. 12/26/21 12/21/22  Gifford Shave, MD  polyethylene glycol powder (GLYCOLAX/MIRALAX) 17 GM/SCOOP powder Start with one scoop daily, increase to two scoops daily as needed for soft bowel movement each day. 09/27/21   Carollee Leitz, MD  potassium chloride (KLOR-CON) 10 MEQ tablet TAKE 1 TABLET BY MOUTH ONCE WEEKLY SAME DAY AS DAY AS FUROSEMIDE 01/24/21   Imogene Burn, PA-C  Vitamin D,  Cholecalciferol, 25 MCG (1000 UT) TABS Take 1,000 Units by mouth daily. 04/22/21   [provider]      Allergies    Patient has no known allergies.    Review of Systems   Review of Systems  Neurological:  Positive for dizziness.   Physical Exam Updated Vital Signs BP (!) 124/94    Pulse 77    Temp 98.4 F (36.9 C) (Oral)    Resp 14    LMP  (LMP Unknown)    SpO2 100%  Physical Exam Vitals and nursing note reviewed.  Constitutional:      General: She is not in acute distress.    Appearance: She is well-developed.  HENT:     Head: Normocephalic and  atraumatic.     Mouth/Throat:     Mouth: Mucous membranes are dry.  Eyes:     Pupils: Pupils are equal, round, and reactive to light.  Cardiovascular:     Rate and Rhythm: Regular rhythm. Tachycardia present.     Heart sounds: Normal heart sounds. No murmur heard.   No friction rub.  Pulmonary:     Effort: Pulmonary effort is normal.     Breath sounds: Normal breath sounds. No wheezing or rales.  Abdominal:     General: Bowel sounds are normal. There is no distension.     Palpations: Abdomen is soft.     Tenderness: There is no abdominal tenderness. There is no guarding or rebound.  Musculoskeletal:        General: No tenderness. Normal range of motion.     Cervical back: Normal range of motion and neck supple.     Right lower leg: No edema.     Left lower leg: No edema.     Comments: No edema  Skin:    General: Skin is warm and dry.     Findings: No rash.  Neurological:     Mental Status: She is alert and oriented to person, place, and time. Mental status is at baseline.     Cranial Nerves: No cranial nerve deficit.  Psychiatric:        Behavior: Behavior normal.    ED Results / Procedures / Treatments   Labs (all labs ordered are listed, but only abnormal results are displayed) Labs Reviewed  CBC WITH DIFFERENTIAL/PLATELET - Abnormal; Notable for the following components:      Result Value   RBC 5.71 (*)    Hemoglobin 16.1 (*)    HCT 48.8 (*)    All other components within normal limits  COMPREHENSIVE METABOLIC PANEL - Abnormal; Notable for the following components:   Glucose, Bld 262 (*)    AST 172 (*)    ALT 244 (*)    All other components within normal limits  URINALYSIS, ROUTINE W REFLEX MICROSCOPIC - Abnormal; Notable for the following components:   Color, Urine COLORLESS (*)    Hgb urine dipstick TRACE (*)    Bacteria, UA RARE (*)    All other components within normal limits  CBG MONITORING, ED - Abnormal; Notable for the following components:    Glucose-Capillary 271 (*)    All other components within normal limits  MAGNESIUM    EKG EKG Interpretation  Date/Time:  Saturday December 31 2021 09:04:17 EST Ventricular Rate:  92 PR Interval:    QRS Duration: 75 QT Interval:  399 QTC Calculation: 472 R Axis:   0 Text Interpretation: new Atrial flutter with predominant 3:1 AV block ST depr, consider ischemia,  inferior leads Confirmed by Blanchie Dessert [phone]) on 12/31/2021 9:59:18 AM  Radiology No results found.  Procedures Procedures    Medications Ordered in ED Medications  lactated ringers bolus 1,000 mL (1,000 mLs Intravenous New Bag/Given 12/31/21 1030)  metoprolol tartrate (LOPRESSOR) injection 2.5 mg (2.5 mg Intravenous Given 12/31/21 1305)    ED Course/ Medical Decision Making/ A&P                           Medical Decision Making Amount and/or Complexity of Data Reviewed External Data Reviewed: notes. Labs: ordered. Decision-making details documented in ED Course. Radiology: ordered and independent interpretation performed. Decision-making details documented in ED Course. ECG/medicine tests: ordered and independent interpretation performed. Decision-making details documented in ED Course.  Risk Prescription drug management.   Patient is a 46 year old female presenting today with complaints of hypertension, hyperglycemia and palpitations.  Patient found to be in atrial flutter today which is new without prior history of similar.  She is otherwise well-appearing and mentating normally.  She denies any recent infectious symptoms.  Based on my evaluation independently of her EKG today there is a flutter with some ST changes which is most consistent with a flutter.  Prior EKGs all show normal sinus rhythm.  She is hyperglycemic today at 271 and that is with taking increased metformin for the last multiple days.  She has no focal findings on exam except for the intermittent tachycardia.  Suspect that is causing her  dizziness and shortness of breath.  However unclear if it is related to electrolyte abnormality or elevated sugar.  Patient given IV fluids.  May need metoprolol. Labs are pending.  External medical records reviewed from recent doctor visits.  2:08 PM I independently interpreted patient's lab work today which is all within normal limits except for hyperglycemia.  And mild elevation of LFTs today with an AST of 172 and an ALT of 244 which patient has had elevation before but this is more significant.  She was given IV fluids but heart rate remains in atrial flutter 3-1.  It does increase to about as high as 120 when she gets up and starts moving around but she is otherwise well-appearing.  Spoke with Dr. Percival Spanish with [facility] cardiology based on patient's symptoms.  She has seen Dr. Harrington Challenger in the past based on external medical records and did have issues with sinus bradycardia with occasional SVT and some PVCs but has never had sustained abnormal rhythm.  Because it is unclear when patient went into this rhythm given issues all week and she is not anticoagulated she is not a candidate for cardioversion at this time.  However given her heart rate is controlled feel that she is stable for discharge home.  She will be started on Eliquis.  She will also be given a as needed metoprolol that she will use if her heart rate remains above 120.  She was not told to take it regularly because her normal heart rates are in the 40s.  Cardiology will call her tomorrow.   CHA2DS2-VASc Score = 1    This indicates a  % annual risk of stroke. The patient's score is based upon:             Final Clinical Impression(s) / ED Diagnoses Final diagnoses:  Atrial flutter, unspecified type ([place])    Rx / DC Orders ED Discharge Orders          Ordered    losartan-hydrochlorothiazide (  HYZAAR) 50-12.5 MG tablet  Daily        12/31/21 1414    apixaban (ELIQUIS) 5 MG TABS tablet  2 times daily        12/31/21 1414     metoprolol tartrate (LOPRESSOR) 25 MG tablet  Once PRN        12/31/21 1414              Blanchie Dessert, MD 12/31/21 1415

## 2021-12-31 NOTE — Progress Notes (Signed)
Dr. Percival Spanish fielded a call from the [facility] ED about this patient with new onset atrial flutter. Also has history of bradycardia. Per his request, I sent msg to EP scheduler via staff message to get in to see EP this week. I relayed that if there was no availability this week to please still coordinate the EP visit but also get her into see afib clinic early in the meantime. Office will call pt with appt info.

## 2021-12-31 NOTE — ED Triage Notes (Signed)
She reports palpitations since yesterday. She tells me that her pulse "is usually slow". 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 She also tells me that her blood sugars have been elevated this week; and that on Monday her pcp changed her metformin dose from 500mg  q a.m. to 1000mg  b.I.d. she has no c/o pain nor illness. She tells me she is "a little dizzy".

## 2021-12-31 NOTE — Discharge Instructions (Signed)
Continue taking your elevated dose of metformin.  Stop your aspirin while you are on the blood thinner.  The cardiology office should call you on Monday for a follow-up.  Just expect when you get up and try to do things you are again to be more winded and tired than usual because of the abnormal rhythm.  If you start having severe shortness of breath, passing out, severe chest pain or other concerns return to the emergency room or if your heart rate will not go down and remains elevated.

## 2022-01-09 ENCOUNTER — Other Ambulatory Visit: Payer: Self-pay

## 2022-01-09 ENCOUNTER — Encounter (HOSPITAL_COMMUNITY): Payer: Self-pay | Admitting: Physician Assistant

## 2022-01-09 ENCOUNTER — Ambulatory Visit (HOSPITAL_COMMUNITY)
Admission: RE | Admit: 2022-01-09 | Discharge: 2022-01-09 | Disposition: A | Discharge status: Home / Self Care | Payer: Medicaid Other | Source: Ambulatory Visit | Attending: Physician Assistant | Admitting: Physician Assistant

## 2022-01-09 VITALS — BP 112/90 | HR 120 | Ht 62.0 in | Wt 208.4 lb

## 2022-01-09 DIAGNOSIS — Z6838 Body mass index (BMI) 38.0-38.9, adult: Secondary | ICD-10-CM | POA: Insufficient documentation

## 2022-01-09 DIAGNOSIS — I4892 Unspecified atrial flutter: Secondary | ICD-10-CM | POA: Diagnosis present

## 2022-01-09 DIAGNOSIS — E119 Type 2 diabetes mellitus without complications: Secondary | ICD-10-CM | POA: Diagnosis not present

## 2022-01-09 DIAGNOSIS — I483 Typical atrial flutter: Secondary | ICD-10-CM | POA: Insufficient documentation

## 2022-01-09 DIAGNOSIS — D6869 Other thrombophilia: Secondary | ICD-10-CM | POA: Diagnosis not present

## 2022-01-09 DIAGNOSIS — I10 Essential (primary) hypertension: Secondary | ICD-10-CM | POA: Diagnosis not present

## 2022-01-09 DIAGNOSIS — E669 Obesity, unspecified: Secondary | ICD-10-CM | POA: Insufficient documentation

## 2022-01-09 HISTORY — DX: Unspecified atrial flutter: I48.92

## 2022-01-09 MED ORDER — DILTIAZEM HCL 30 MG PO TABS: ORAL_TABLET | ORAL | 1 refills | Status: DC

## 2022-01-09 NOTE — Patient Instructions (Signed)
cardizem 30 mg: tome 1 tableta cada 4 horas segn sea necesario para frecuencias cardacas superiores a 100  STOP metoprolol  Cardioversin programada para el mircoles 8 de febrero - Llegue a la entrada principal de la [place] y vaya a admisin a las 9 a.m. - No coma ni beba nada despus de la medianoche de la noche anterior a su procedimiento. - Tome todos sus medicamentos de la maana (excepto los medicamentos para la diabetes) con un sorbo de agua antes de la llegada. - No podr conducir hasta su casa despus de su procedimiento. - NO omita ninguna dosis de su anticoagulante; si omite una dosis, notifique a nuestra oficina de inmediato. - Si siente que vuelve al ritmo normal antes de la cardioversin programada, notifique a nuestra oficina de inmediato. Si su procedimiento se cancela en la sala de cardioversin, se le cobrar una tarifa de cancelacin.     Cardioversion scheduled for Wednesday, February 8th  - Arrive at the [facility] and go to admitting at [facility] not eat or drink anything after midnight the night prior to your procedure.  - Take all your morning medication (except diabetic medications) with a sip of water prior to arrival.  - You will not be able to drive home after your procedure.  - Do NOT miss any doses of your blood thinner - if you should miss a dose please notify our office immediately.  - If you feel as if you go back into normal rhythm prior to scheduled cardioversion, please notify our office immediately. If your procedure is canceled in the cardioversion suite you will be charged a cancellation fee.

## 2022-01-09 NOTE — Progress Notes (Signed)
Primary Care Physician: Carollee Leitz, MD Primary Cardiologist: Dr Harrington Challenger Primary Electrophysiologist: Dr Curt Bears (remotely) Dr Quentin Ore (new) Referring Physician: Dr Madie Reno Debra Diaz is a 46 y.o. female with a history of DM, HTN, [place] myxoma s/p resection 2016, atrial flutter who presents for consultation in the [facility].  The patient was initially diagnosed with atrial flutter 12/31/21 after presenting to the ED with symptoms of palpitations, SOB, and dizziness. ECG at the ED showed atrial flutter. Her blood glucose levels were also elevated. Patient was started on Eliquis for a CHADS2VASC score of 3. She continues to have symptoms of fatigue and SOB. She has taken the PRN BB 4 times but she states this has not really helped. She denies any missed doses of anticoagulation.   Today, she denies symptoms of orthopnea, PND, lower extremity edema, dizziness, presyncope, syncope, snoring, daytime somnolence, bleeding, or neurologic sequela. The patient is tolerating medications without difficulties and is otherwise without complaint today.    Atrial Fibrillation Risk Factors:  she does not have symptoms or diagnosis of sleep apnea. Negative sleep study 2021 she does not have a history of rheumatic fever. she does have a history of alcohol use.   she has a BMI of Body mass index is 38.12 kg/m.Marland Kitchen Filed Weights   01/09/22 1423  Weight: 94.5 kg    Family History  Problem Relation Age of Onset   Hypertension Mother    Heart disease Mother    Diabetes Father    Breast cancer Maternal Grandmother    Stomach cancer Maternal Grandfather    Asthma Son    Colon cancer Neg Hx    Rectal cancer Neg Hx      Atrial Fibrillation Management history:  Previous antiarrhythmic drugs: none Previous cardioversions: none Previous ablations: none CHADS2VASC score: 3 Anticoagulation history: Eliquis   Past Medical History:  Diagnosis Date   Anemia     Blood transfusion without reported diagnosis    Diabetes mellitus without complication ([place])    Hyperlipidemia    Hypertension    Hypokalemia    Morbid obesity ([place])    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   Past Surgical History:  Procedure Laterality Date   CESAREAN SECTION  1999 and 2011   x 2   LEFT HEART CATHETERIZATION WITH CORONARY ANGIOGRAM N/A 03/02/2015   Procedure: LEFT HEART CATHETERIZATION WITH CORONARY ANGIOGRAM;  Surgeon: [name], MD;  Location: [facility] CATH LAB;  Service: Cardiovascular;  Laterality: N/A;   MINIMALLY INVASIVE EXCISION OF ATRIAL MYXOMA N/A 03/04/2015   Procedure: MINIMALLY INVASIVE RESECTION OF LEFT ATRIAL MYXOMA ( USING A BILAYER PATCH CLOSURE);  Surgeon: [name], MD;  Location: [facility] [place];  Service: Open Heart Surgery;  Laterality: N/A;   myxoma N/A    chest   TEE WITHOUT CARDIOVERSION N/A 03/01/2015   Procedure: TRANSESOPHAGEAL ECHOCARDIOGRAM (TEE);  Surgeon: [name], MD;  Location: [facility] ENDOSCOPY;  Service: Cardiovascular;  Laterality: N/A;   TEE WITHOUT CARDIOVERSION N/A 03/04/2015   Procedure: TRANSESOPHAGEAL ECHOCARDIOGRAM (TEE);  Surgeon: [name], MD;  Location: [facility];  Service: Open Heart Surgery;  Laterality: N/A;   TUBAL LIGATION  2011    Current Outpatient Medications  Medication Sig Dispense Refill   apixaban (ELIQUIS) 5 MG TABS tablet Take 1 tablet (5 mg total) by mouth 2 (two) times daily. 60 tablet 0   blood glucose meter kit and supplies KIT Dispense based on patient  and insurance preference. Use up to four times daily as directed. (FOR ICD-9 250.00, 250.01). 1 each 0   Blood Pressure KIT Check blood pressure twice a day.  Dx code: I10 1 kit 0   glucose blood (ACCU-CHEK GUIDE) test strip Use as instructed 350 strip 1   linaclotide (LINZESS) 145 MCG CAPS capsule Take 1 capsule (145 mcg total) by mouth daily before breakfast. 30 capsule 11   losartan-hydrochlorothiazide (HYZAAR) 50-12.5 MG  tablet Take 1 tablet by mouth daily. 90 tablet 1   metFORMIN (GLUCOPHAGE-XR) 500 MG 24 hr tablet Take 2 tablets (1,000 mg total) by mouth in the morning and at bedtime. 360 tablet 3   metoprolol tartrate (LOPRESSOR) 25 MG tablet Take 1 tablet (25 mg total) by mouth once as needed for up to 1 dose (take as needed if your heart rate stays elevated above 120-140.). 15 tablet 0   polyethylene glycol powder (GLYCOLAX/MIRALAX) 17 GM/SCOOP powder Start with one scoop daily, increase to two scoops daily as needed for soft bowel movement each day. 500 g 0   potassium chloride (KLOR-CON) 10 MEQ tablet TAKE 1 TABLET BY MOUTH ONCE WEEKLY SAME DAY AS DAY AS FUROSEMIDE 15 tablet 3   Vitamin D, Cholecalciferol, 25 MCG (1000 UT) TABS Take 1,000 Units by mouth daily.     No current facility-administered medications for this encounter.    No Known Allergies  Social History   Socioeconomic History   Marital status: Married    Spouse name: Not on file   Number of children: Not on file   Years of education: Not on file   Highest education level: Not on file  Occupational History   Not on file  Tobacco Use   Smoking status: Former    Types: Cigarettes    Quit date: 11/17/2017    Years since quitting: 4.1   Smokeless tobacco: Never   Tobacco comments:    Former smoker 01/09/22  Vaping Use   Vaping Use: Never used  Substance and Sexual Activity   Alcohol use: Yes    Comment: occasional   Drug use: No   Sexual activity: Yes    Birth control/protection: Surgical  Other Topics Concern   Not on file  Social History Narrative   Not on file   Social Determinants of Health   Financial Resource Strain: Not on file  Food Insecurity: Not on file  Transportation Needs: Not on file  Physical Activity: Not on file  Stress: Not on file  Social Connections: Not on file  Intimate Partner Violence: Not on file     ROS- All systems are reviewed and negative except as per the HPI above.  Physical  Exam: Vitals:   01/09/22 1423  BP: 112/90  Pulse: (!) 120  Weight: 94.5 kg  Height: _0  (1.575 m)    GEN- The patient is a well appearing obese female, alert and oriented x 3 today.   Head- normocephalic, atraumatic Eyes-  Sclera clear, conjunctiva pink Ears- hearing intact Oropharynx- clear Neck- supple  Lungs- Clear to ausculation bilaterally, normal work of breathing Heart- Regular rate and rhythm, tachycardia, no murmurs, rubs or gallops  GI- soft, NT, ND, + BS Extremities- no clubbing, cyanosis, or edema MS- no significant deformity or atrophy Skin- no rash or lesion Psych- euthymic mood, full affect Neuro- strength and sensation are intact  Wt Readings from Last 3 Encounters:  01/09/22 94.5 kg  12/26/21 94.4 kg  11/22/21 93 kg    EKG today  demonstrates  Typical appear atrial flutter with variable block Vent. rate 120 BPM PR interval * ms QRS duration 74 ms QT/QTcB 408/576 ms  Echo 08/11/21 demonstrated   1. Left ventricular ejection fraction, by estimation, is 60 to 65%. Left  ventricular ejection fraction by 3D volume is 63 %. The left ventricle has normal function. The left ventricle has no regional wall motion  abnormalities. Left ventricular diastolic  parameters were normal.   2. Right ventricular systolic function is normal. The right ventricular  size is normal. There is normal pulmonary artery systolic pressure.   3. Left atrial size was moderately dilated.   4. Right atrial size was mildly dilated.   5. The mitral valve is normal in structure. No evidence of mitral valve regurgitation.   6. The aortic valve is normal in structure. Aortic valve regurgitation is not visualized. No aortic stenosis is present.   Epic records are reviewed at length today  CHA2DS2-VASc Score = 3  The patient's score is based upon: CHF History: 0 HTN History: 1 Diabetes History: 1 Stroke History: 0 Vascular Disease History: 0 Age Score: 0 Gender Score: 1        ASSESSMENT AND PLAN: 1. Typical atrial flutter The patient's CHA2DS2-VASc score is 3, indicating a 3.2% annual risk of stroke.   Patient in atrial flutter with rapid rates today. We discussed short term and long term rhythm control strategies.  Will arrange for DCCV since she appears persistent.  Will change metoprolol to diltiazem 30 mg q 4 hours PRN for heart racing. Will not start scheduled daily rate control given her h/p significant bradycardia.  We discussed atrial flutter ablation as a long term rhythm strategy and patient is already scheduled to discuss with Dr Quentin Ore.  Continue Eliquis 5 mg BID  2. Secondary Hypercoagulable State (ICD10:  D68.69) The patient is at significant risk for stroke/thromboembolism based upon her CHA2DS2-VASc Score of 3.  Continue Apixaban (Eliquis).   3. Obesity Body mass index is 38.12 kg/m. Lifestyle modification was discussed at length including regular exercise and weight reduction.  4. HTN Stable, no changes today.   Follow up with Dr Harrington Challenger and Dr Quentin Ore as scheduled.    [facility] [address] [place], [place] [address] [phone] 01/09/2022 2:37 PM

## 2022-01-17 ENCOUNTER — Encounter (HOSPITAL_COMMUNITY): Payer: Self-pay | Admitting: Internal Medicine

## 2022-01-18 ENCOUNTER — Ambulatory Visit (INDEPENDENT_AMBULATORY_CARE_PROVIDER_SITE_OTHER): Payer: Medicaid Other | Admitting: Internal Medicine

## 2022-01-18 ENCOUNTER — Other Ambulatory Visit: Payer: Self-pay

## 2022-01-18 ENCOUNTER — Encounter: Payer: Self-pay | Admitting: Internal Medicine

## 2022-01-18 ENCOUNTER — Other Ambulatory Visit: Payer: Medicaid Other | Admitting: *Deleted

## 2022-01-18 VITALS — BP 122/80 | HR 84 | Resp 20 | Ht 62.0 in | Wt 209.2 lb

## 2022-01-18 DIAGNOSIS — I483 Typical atrial flutter: Secondary | ICD-10-CM

## 2022-01-18 DIAGNOSIS — I4892 Unspecified atrial flutter: Secondary | ICD-10-CM | POA: Diagnosis not present

## 2022-01-18 LAB — CBC WITH DIFFERENTIAL/PLATELET
Basophils Absolute: 0.1 10*3/uL (ref 0.0–0.2)
Basos: 1 %
EOS (ABSOLUTE): 0.2 10*3/uL (ref 0.0–0.4)
Eos: 2 %
Hematocrit: 47.3 % — ABNORMAL HIGH (ref 34.0–46.6)
Hemoglobin: 15.3 g/dL (ref 11.1–15.9)
Immature Grans (Abs): 0 10*3/uL (ref 0.0–0.1)
Immature Granulocytes: 0 %
Lymphocytes Absolute: 3.1 10*3/uL (ref 0.7–3.1)
Lymphs: 30 %
MCH: 28 pg (ref 26.6–33.0)
MCHC: 32.3 g/dL (ref 31.5–35.7)
MCV: 87 fL (ref 79–97)
Monocytes Absolute: 0.7 10*3/uL (ref 0.1–0.9)
Monocytes: 6 %
Neutrophils Absolute: 6.3 10*3/uL (ref 1.4–7.0)
Neutrophils: 61 %
Platelets: 276 10*3/uL (ref 150–450)
RBC: 5.47 x10E6/uL — ABNORMAL HIGH (ref 3.77–5.28)
RDW: 14.2 % (ref 11.7–15.4)
WBC: 10.3 10*3/uL (ref 3.4–10.8)

## 2022-01-18 LAB — BASIC METABOLIC PANEL
BUN/Creatinine Ratio: 23 (ref 9–23)
BUN: 15 mg/dL (ref 6–24)
CO2: 26 mmol/L (ref 20–29)
Calcium: 9.4 mg/dL (ref 8.7–10.2)
Chloride: 98 mmol/L (ref 96–106)
Creatinine, Ser: 0.66 mg/dL (ref 0.57–1.00)
Glucose: 227 mg/dL — ABNORMAL HIGH (ref 70–99)
Potassium: 4 mmol/L (ref 3.5–5.2)
Sodium: 137 mmol/L (ref 134–144)
eGFR: 109 mL/min/{1.73_m2} (ref >59)

## 2022-01-18 NOTE — Patient Instructions (Signed)
Medication Instructions:  Your physician recommends that you continue on your current medications as directed. Please refer to the Current Medication list given to you today.  *If you need a refill on your cardiac medications before your next appointment, please call your pharmacy*   Lab Work:  If you have labs (blood work) drawn today and your tests are completely normal, you will receive your results only by: [place] (if you have MyChart) OR A paper copy in the mail If you have any lab test that is abnormal or we need to change your treatment, we will call you to review the results.   Testing/Procedures: none   Follow-Up: At [facility], you and your health needs are our priority.  As part of our continuing mission to provide you with exceptional heart care, we have created designated Provider Care Teams.  These Care Teams include your primary Cardiologist (physician) and Advanced Practice Providers (APPs -  Physician Assistants and Nurse Practitioners) who all work together to provide you with the care you need, when you need it.  We recommend signing up for the patient portal called "MyChart".  Sign up information is provided on this After Visit Summary.  MyChart is used to connect with patients for Virtual Visits (Telemedicine).  Patients are able to view lab/test results, encounter notes, upcoming appointments, etc.  Non-urgent messages can be sent to your provider as well.   To learn more about what you can do with MyChart, go to [email or web address].

## 2022-01-18 NOTE — Progress Notes (Signed)
Cardiology Office Note   Date:  01/18/2022   ID:  Debra, Diaz 26-Sep-1976, MRN [id number]  PCP:  Carollee Leitz, MD  Cardiologist:   Dorris Carnes, MD   Pt presents for f/u of CP     History of Present Illness: Debra Diaz is a 46 y.o. female with a history of atial myxoma( s/p excision in 2016),  T2DM, HTN and  chest pain  She had a cardiac catheterization in 2020 which was normal       Has been on amldopine in past  Stopped due to dizziness I saw the pt in Spring 2022   She was seen by D Dunn in Aug 2022  CP improved   Echo ordeered  Pt was also  set up for Zio patch since HR noted to be low  Monitor showed SR   AVerage HR 49 bpm    GXT was  done   During test patient was able to get HR up to 126 with activity Walked 8 min 12 seconds during test   On 12/31/21 she was seen in ED for HTN    BP and gluccose were high  In addition, pt was \ Found to be in atrial flutter    She was placed on Eliquis in Dec (CHADsVASc 3)    After Rx filled has not missed a dose  The pt was seen by C Fenton on 01/09/22   Set up for cardioversion     PT not on b blocker, makes her too fatigued.  Not on diltiazems  Leads to nausea   The pt still gets winded easy   Will feel heart beating faster       Current Meds  Medication Sig   apixaban (ELIQUIS) 5 MG TABS tablet Take 1 tablet (5 mg total) by mouth 2 (two) times daily.   atorvastatin (LIPITOR) 10 MG tablet Take 10 mg by mouth daily. Pt doesn't know the dosage she is on   blood glucose meter kit and supplies KIT Dispense based on patient and insurance preference. Use up to four times daily as directed. (FOR ICD-9 250.00, 250.01).   Blood Pressure KIT Check blood pressure twice a day.  Dx code: I10   diltiazem (CARDIZEM) 30 MG tablet Take 1 tablet every 4 hours AS NEEDED for heart rate >100   glucose blood (ACCU-CHEK GUIDE) test strip Use as instructed   linaclotide (LINZESS) 145 MCG CAPS capsule Take 1 capsule (145 mcg total) by  mouth daily before breakfast.   losartan-hydrochlorothiazide (HYZAAR) 50-12.5 MG tablet Take 1 tablet by mouth daily.   metFORMIN (GLUCOPHAGE-XR) 500 MG 24 hr tablet Take 2 tablets (1,000 mg total) by mouth in the morning and at bedtime.   polyethylene glycol powder (GLYCOLAX/MIRALAX) 17 GM/SCOOP powder Start with one scoop daily, increase to two scoops daily as needed for soft bowel movement each day.   potassium chloride (KLOR-CON) 10 MEQ tablet TAKE 1 TABLET BY MOUTH ONCE WEEKLY SAME DAY AS DAY AS FUROSEMIDE   Vitamin D, Cholecalciferol, 25 MCG (1000 UT) TABS Take 1,000 Units by mouth daily.     Allergies:   Patient has no known allergies.   Past Medical History:  Diagnosis Date   Anemia    Blood transfusion without reported diagnosis    Diabetes mellitus without complication ([place])    Hyperlipidemia    Hypertension    Hypokalemia    Morbid obesity ([place])    s/p minimally invasive resection of left atrial  myxoma 03/04/2015    Past Surgical History:  Procedure Laterality Date   CESAREAN SECTION  1999 and 2011   x 2   LEFT HEART CATHETERIZATION WITH CORONARY ANGIOGRAM N/A 03/02/2015   Procedure: LEFT HEART CATHETERIZATION WITH CORONARY ANGIOGRAM;  Surgeon: [name], MD;  Location[facility] C[facility] LAB;  Service: Cardiovascular;  Laterality: N/A;   MINIMALLY INVASIVE EXCISION OF ATRIAL MYXOMA N/A 03/04/2015   Procedure: MINIMALLY INVASIVE RESECTION OF LEFT ATRIAL MYXOMA ( USING A BILAYER PATCH CLOSURE);  Surgeon: [name], MD;  Location: [place];  Service: Open Heart Surgery;  Laterality: N/A;   myxoma N/A    chest   TEE WITHOUT CARDIOVERSION N/A 03/01/2015   Procedure: TRANSESOPHAGEAL ECHOCARDIOGRAM (TEE);  Surgeon: [name], MD;  Location: [facility] ENDOSCOPY;  Service: Cardiovascular;  Laterality: N/A;   TEE WITHOUT CARDIOVERSION N/A 03/04/2015   Procedure: TRANSESOPHAGEAL ECHOCARDIOGRAM (TEE);  Surgeon: [name], MD;  Location: [place];  Service: Open Heart Surgery;   Laterality: N/A;   TUBAL LIGATION  2011     Social History:  The patient  reports that she quit smoking about 4 years ago. Her smoking use included cigarettes. She has never used smokeless tobacco. She reports current alcohol use. She reports that she does not use drugs.   Family History:  The patient's family history includes Asthma in her son; Breast cancer in her maternal grandmother; Diabetes in her father; Heart disease in her mother; Hypertension in her mother; Stomach cancer in her maternal grandfather.    ROS:  Please see the history of present illness. All other systems are reviewed and  Negative to the above problem except as noted.    PHYSICAL EXAM: VS:  BP 122/80 (BP Location: Left Arm, Patient Position: Sitting, Cuff Size: Normal)    Pulse 84    Resp 20    Ht 5' 2"  (1.575 m)    Wt 209 lb 3.2 oz (94.9 kg)    LMP  (LMP Unknown)    SpO2 98%    BMI 38.26 kg/m   GEN:  Morbidly obese 46 yo in no acute distress  HEENT: normal  Neck: no JVD, no carotid bruits Cardiac: RRR; no murmurs  No LE  edema  Respiratory:  clear to auscultation bilaterally GI: soft, nontender, nondistended, + BS  No hepatomegaly  MS: no deformity Moving all extremities   Skin: warm and dry, no rash Neuro:  Strength and sensation are intact Psych: euthymic mood, full affect   EKG:  EKG is not ordered today.  CARDIAC STUDIES  Relevant CV Studies:  GXT   09/06/21  Patch Wear Time:  5 days and 21 hours (2022-08-11T19:19:34-0400 to 2022-08-17T16:35:17-0400)   Rhythm:  Sinus   Rates  28 to 115 bpm   Average HR 49 bpm   Rare PVC, PAC      Triggered events did not correlate with arrhythmias or extremes in HR.    Zio monitor   08/19/21  Patch Wear Time:  5 days and 21 hours (2022-08-11T19:19:34-0400 to 2022-08-17T16:35:17-0400)   Rhythm:  Sinus   Rates  28 to 115 bpm   Average HR 49 bpm   Rare PVC, PAC      Triggered events did not correlate with arrhythmias or extremes in HR.     Aug 2022    Echo  Left ventricular ejection fraction, by estimation, is 60 to 65%. Left ventricular ejection fraction by 3D volume is 63 %. The left ventricle has normal function. The  left ventricle has no regional wall motion abnormalities. Left ventricular diastolic parameters were normal. 1. Right ventricular systolic function is normal. The right ventricular size is normal. There is normal pulmonary artery systolic pressure. 2. 3. Left atrial size was moderately dilated. 4. Right atrial size was mildly dilated. 5. The mitral valve is normal in structure. No evidence of mitral valve regurgitation. The aortic valve is normal in structure. Aortic valve regurgitation is not visualized. No aortic stenosis is present.     CATH Sept 2020 [facility]   Findings:  1. Hemodynamics: Aortic pressure 125/75, LVEDP 12 2. Coronary system:  Left Main: Large caliber vessel with no angiographic evidence of  stenosis.  LAD system: Large caliber vessel, wraps around the apex. NO significant  stenosis  LCX system: Large caliber vessel.  No significant stenosis.  RCA system: right dominant. No significant stenosis.  Conclusion: Normal coronaries CAD as mentioned above. Normal LVEDP  Recommendations: Continue risk factor modifications  LHC 02/2015 Dominance: Right Left Main: Very Large caliber vessel that trifurcates into the LAD, Ramus Intermedius, and Left Circumflex. Angiographically normal   LAD: Large-caliber vessel with a very proximal large caliber High First Diagonal Branch. The LAD has mild diffuse tender 20% lesions but is relatively atrophic normal as it courses down around the apex perfusing the distal third of the inferoapex.   D1: Large caliber high branch with several distal branches. Mild possible luminal irregularities of less than 30%.   D2: Moderate caliber vessel that arises from the mid LAD. It does not cover large distribution but is angiographically normal.   Left Circumflex:  Large-caliber, nondominant vessel that courses mostly as a large lateral bifurcating OM branch. The inferior branch is much larger than the superior branch. The distal vessel is tortuous and reaches down almost of the inferoapex. Mild luminal irregularity. There is a very small AV groove branch..    Ramus intermedius: Large caliber vessel that courses is a high OM. It gives off a moderate sized branch from the mid vessel just after a eccentric tubular 30% lesion.. The daughter vessel and parent both reaches almost to the apex. They are somewhat tortuous, but relatively free of disease.    RCA: Large-caliber codominant vessel that gives rise to a significant RV marginal branch in the mid vessel. It bifurcates distally into the Right Posterior Descending Artery  (RPDA) and the Right Posterior AV Groove Branch (RPAV).  Angiographically normal.   RPDA: Large caliber vessel that reaches two thirds the way to the apex. Angiographically normal.   RPL Sysytem:The RPAV begins as a moderate large vessel that terminates as a moderate caliber posterolateral branch. Angiographically normal.  MLipid Panel    Component Value Date/Time   CHOL 161 06/03/2021 1428   TRIG 180 (H) 06/03/2021 1428   HDL 32 (L) 06/03/2021 1428   CHOLHDL 5.0 (H) 06/03/2021 1428   CHOLHDL 4.0 09/01/2016 1607   VLDL 42 (H) 09/01/2016 1607   LDLCALC 98 06/03/2021 1428   LDLDIRECT 97 09/27/2021 1436      Wt Readings from Last 3 Encounters:  01/18/22 209 lb 3.2 oz (94.9 kg)  01/09/22 208 lb 6.4 oz (94.5 kg)  12/26/21 208 lb 3.2 oz (94.4 kg)      ASSESSMENT AND PLAN:  1  Atrial flutter   PT with new atrial flutter   She is on Eliquis   Not tolerating b blocker or Ca blocker.  Keep on Eliquis     She has appt with C lambert next week  to review   Discuss possible ablation so that she can come off of Eliquis  She is sedt up for cardioversion    Keep on same meds    can use dilt prin    2  Chest pressure   Normal caths in the  past  She does get some pressure   Probably due to rapid rates   Follow    4  Hx of bradycardia  Currently HR is faster   Follow     5  Hx myxoma   S/p resection of myxoma in 2016  Echo I 2018 with no recurrence   Will follow up with a repeat at some pt to  6  HTN   Continue current meds   7   HL   On lipitor  Normal coronary arteries in 2020   LDL 100 in 2021    Current medicines are reviewed at length with the patient today.  The patient does not have concerns regarding medicines.  Signed, Dorris Carnes, MD  01/18/2022 10:11 AM    [facility] [place], [place], [place]  [address] Phone: [phone]; Fax: [phone]

## 2022-01-18 NOTE — Addendum Note (Signed)
Encounter addended by: Damian Leavell, RN on: 01/18/2022 10:47 AM  Actions taken: Order list changed, Diagnosis association updated

## 2022-01-21 ENCOUNTER — Other Ambulatory Visit (HOSPITAL_COMMUNITY): Payer: Self-pay | Admitting: Physician Assistant

## 2022-01-23 ENCOUNTER — Other Ambulatory Visit (HOSPITAL_COMMUNITY): Payer: Self-pay | Admitting: *Deleted

## 2022-01-23 MED ORDER — DILTIAZEM HCL 30 MG PO TABS: ORAL_TABLET | ORAL | 1 refills | Status: DC

## 2022-01-24 ENCOUNTER — Encounter: Payer: Self-pay | Admitting: *Deleted

## 2022-01-24 ENCOUNTER — Other Ambulatory Visit: Payer: Self-pay

## 2022-01-24 ENCOUNTER — Encounter: Payer: Self-pay | Admitting: Cardiology

## 2022-01-24 ENCOUNTER — Ambulatory Visit: Payer: Medicaid Other | Admitting: Cardiology

## 2022-01-24 VITALS — BP 120/82 | HR 81 | Ht 63.0 in | Wt 209.0 lb

## 2022-01-24 DIAGNOSIS — Z01818 Encounter for other preprocedural examination: Secondary | ICD-10-CM

## 2022-01-24 DIAGNOSIS — I484 Atypical atrial flutter: Secondary | ICD-10-CM | POA: Diagnosis not present

## 2022-01-24 DIAGNOSIS — D151 Benign neoplasm of heart: Secondary | ICD-10-CM

## 2022-01-24 LAB — CBC WITH DIFFERENTIAL/PLATELET
Basophils Absolute: 0.1 10*3/uL (ref 0.0–0.2)
Basos: 1 %
EOS (ABSOLUTE): 0.2 10*3/uL (ref 0.0–0.4)
Eos: 2 %
Hematocrit: 47.1 % — ABNORMAL HIGH (ref 34.0–46.6)
Hemoglobin: 15.6 g/dL (ref 11.1–15.9)
Immature Grans (Abs): 0 10*3/uL (ref 0.0–0.1)
Immature Granulocytes: 0 %
Lymphocytes Absolute: 3.1 10*3/uL (ref 0.7–3.1)
Lymphs: 31 %
MCH: 28.4 pg (ref 26.6–33.0)
MCHC: 33.1 g/dL (ref 31.5–35.7)
MCV: 86 fL (ref 79–97)
Monocytes Absolute: 0.7 10*3/uL (ref 0.1–0.9)
Monocytes: 7 %
Neutrophils Absolute: 5.9 10*3/uL (ref 1.4–7.0)
Neutrophils: 59 %
Platelets: 319 10*3/uL (ref 150–450)
RBC: 5.49 x10E6/uL — ABNORMAL HIGH (ref 3.77–5.28)
RDW: 14.3 % (ref 11.7–15.4)
WBC: 10 10*3/uL (ref 3.4–10.8)

## 2022-01-24 LAB — BASIC METABOLIC PANEL
BUN/Creatinine Ratio: 15 (ref 9–23)
BUN: 10 mg/dL (ref 6–24)
CO2: 29 mmol/L (ref 20–29)
Calcium: 9.9 mg/dL (ref 8.7–10.2)
Chloride: 99 mmol/L (ref 96–106)
Creatinine, Ser: 0.65 mg/dL (ref 0.57–1.00)
Glucose: 134 mg/dL — ABNORMAL HIGH (ref 70–99)
Potassium: 4.4 mmol/L (ref 3.5–5.2)
Sodium: 140 mmol/L (ref 134–144)
eGFR: 110 mL/min/{1.73_m2} (ref >59)

## 2022-01-24 MED ORDER — APIXABAN 5 MG PO TABS: 5.0000 mg | ORAL_TABLET | Freq: Two times a day (BID) | ORAL | 0 refills | Status: DC

## 2022-01-24 NOTE — H&P (View-Only) (Signed)
Electrophysiology Office Note:    Date:  01/24/2022   ID:  Debra Diaz, DOB [birth year], MRN [id number]  PCP:  Carollee Leitz, MD  [facility] Cardiologist:  Dorris Carnes, MD  [facility] Electrophysiologist:  Vickie Epley, MD   Referring MD: Carollee Leitz, MD   Chief Complaint: Atrial flutter  History of Present Illness:    Debra Diaz is a 46 y.o. female who presents for an evaluation of new onset atrial flutter at the request of Dr. Percival Spanish. Their medical history includes bradycardia, hypertension, hyperlipidemia, hypokalemia, diabetes, anemia, and morbid obesity.   History of myxoma post excision in 2016 Saw Dr. 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  This was performed via a right mini anterolateral thoracotomy.  During the procedure that the mass was very large and an incision was created along the back wall of both the left and right atrium.  There was full-thickness resection of the left atrial wall from the fossa ovalis along the medial surface of the left atrium towards the anterior leaflet of the mitral valve.  Interpretation was performed via video services today. She reports a "little bit" of chest heaviness and is overall feeling very fatigued.  We discussed her cardioversion procedure at length, including risks. This is currently scheduled for tomorrow.  No bleeding issues on Eliquis.  She denies any palpitations, chest pain, or shortness of breath. No lightheadedness, headaches, syncope, orthopnea, PND, or lower extremity edema.      Past Medical History:  Diagnosis Date   Anemia    Blood transfusion without reported diagnosis    Diabetes mellitus without complication ([place])    Hyperlipidemia    Hypertension    Hypokalemia    Morbid obesity ([place])    s/p  minimally invasive resection of left atrial myxoma 03/04/2015    Past Surgical History:  Procedure Laterality Date   [place] and 2011   x 2   LEFT HEART CATHETERIZATION WITH CORONARY ANGIOGRAM N/A 03/02/2015   Procedure: LEFT HEART CATHETERIZATION WITH CORONARY ANGIOGRAM;  Surgeon: [name], MD;  Location: [facility] CATH LAB;  Service: Cardiovascular;  Laterality: N/A;   MINIMALLY INVASIVE EXCISION OF ATRIAL MYXOMA N/A 03/04/2015   Procedure: MINIMALLY INVASIVE RESECTION OF LEFT ATRIAL MYXOMA ( USING A BILAYER PATCH CLOSURE);  Surgeon: [name], MD;  Location: [place];  Service: Open Heart Surgery;  Laterality: N/A;   myxoma N/A    chest   TEE WITHOUT CARDIOVERSION N/A 03/01/2015   Procedure: TRANSESOPHAGEAL ECHOCARDIOGRAM (TEE);  Surgeon: [name], MD;  Location: [facility] ENDOSCOPY;  Service: Cardiovascular;  Laterality: N/A;   TEE WITHOUT CARDIOVERSION N/A 03/04/2015   Procedure: TRANSESOPHAGEAL ECHOCARDIOGRAM (TEE);  Surgeon: [name], MD;  Location: [place];  Service: Open Heart Surgery;  Laterality: N/A;   TUBAL LIGATION  2011    Current Medications: Current Meds  Medication Sig   apixaban (ELIQUIS) 5 MG TABS tablet Take 1 tablet (5 mg total) by mouth 2 (two) times daily.   blood glucose meter kit and supplies KIT Dispense based on patient and insurance preference. Use up to four times daily as directed. (FOR ICD-9 250.00, 250.01).   Blood Pressure KIT Check blood pressure twice a day.  Dx code: I10   diltiazem (CARDIZEM) 30 MG tablet Take 1 tablet every 4 hours AS  NEEDED for heart rate >100   glucose blood (ACCU-CHEK GUIDE) test strip Use as instructed   ibuprofen (ADVIL) 200 MG tablet Take 600 mg by mouth every 6 (six) hours as needed for moderate pain or headache.   linaclotide (LINZESS) 145 MCG CAPS capsule Take 1 capsule (145 mcg total) by mouth daily before breakfast. (Patient taking differently: Take 145 mcg by mouth at bedtime.)    losartan-hydrochlorothiazide (HYZAAR) 50-12.5 MG tablet Take 1 tablet by mouth daily.   metFORMIN (GLUCOPHAGE-XR) 500 MG 24 hr tablet Take 2 tablets (1,000 mg total) by mouth in the morning and at bedtime.   polyethylene glycol powder (GLYCOLAX/MIRALAX) 17 GM/SCOOP powder Start with one scoop daily, increase to two scoops daily as needed for soft bowel movement each day.   potassium chloride (KLOR-CON) 10 MEQ tablet TAKE 1 TABLET BY MOUTH ONCE WEEKLY SAME DAY AS DAY AS FUROSEMIDE   Vitamin D, Cholecalciferol, 25 MCG (1000 UT) TABS Take 1,000 Units by mouth daily.     Allergies:   Patient has no known allergies.   Social History   Socioeconomic History   Marital status: Married    Spouse name: Not on file   Number of children: Not on file   Years of education: Not on file   Highest education level: Not on file  Occupational History   Not on file  Tobacco Use   Smoking status: Former    Types: Cigarettes    Quit date: 11/17/2017    Years since quitting: 4.1   Smokeless tobacco: Never   Tobacco comments:    Former smoker 01/09/22  Vaping Use   Vaping Use: Never used  Substance and Sexual Activity   Alcohol use: Yes    Comment: occasional   Drug use: No   Sexual activity: Yes    Birth control/protection: Surgical  Other Topics Concern   Not on file  Social History Narrative   Not on file   Social Determinants of Health   Financial Resource Strain: Not on file  Food Insecurity: Not on file  Transportation Needs: Not on file  Physical Activity: Not on file  Stress: Not on file  Social Connections: Not on file     Family History: The patient's family history includes Asthma in her son; Breast cancer in her maternal grandmother; Diabetes in her father; Heart disease in her mother; Hypertension in her mother; Stomach cancer in her maternal grandfather. There is no history of Colon cancer or Rectal cancer.  ROS:   Please see the history of present illness.    (+) Chest  heaviness (+) Fatigue All other systems reviewed and are negative.  EKGs/Labs/Other Studies Reviewed:    The following studies were reviewed today:  09/06/2021 ETT:   No ST deviation was noted.   Prior study not available for comparison.   Hypertensive blood pressure response.   No ischemic changes noted at sub-target heart rate.   No ischemic changes noted, but patient did not reach target heart rate.  Therefore, test is not diagnostic.  Consider functional testing or coronary CT-A if clinically indicated.  08/2021 Monitor: Patch Wear Time:  5 days and 21 hours (2022-08-11T19:19:34-0400 to 2022-08-17T16:35:17-0400)   Rhythm:  Sinus   Rates  28 to 115 bpm   Average HR 49 bpm   Rare PVC, PAC      Triggered events did not correlate with arrhythmias or extremes in HR.    08/11/2021 Echo  1. Left ventricular ejection fraction, by estimation, is 60  to 65%. Left  ventricular ejection fraction by 3D volume is 63 %. The left ventricle has  normal function. The left ventricle has no regional wall motion  abnormalities. Left ventricular diastolic   parameters were normal.   2. Right ventricular systolic function is normal. The right ventricular  size is normal. There is normal pulmonary artery systolic pressure.   3. Left atrial size was moderately dilated.   4. Right atrial size was mildly dilated.   5. The mitral valve is normal in structure. No evidence of mitral valve  regurgitation.   6. The aortic valve is normal in structure. Aortic valve regurgitation is  not visualized. No aortic stenosis is present.   09/13/2016 Lexiscan Myoview: Nuclear stress EF: 58%. There was no ST segment deviation noted during stress. The study is normal. This is a low risk study. The left ventricular ejection fraction is normal (55-65%).   Normal pharmacologic nuclear stress test with no evidence of prior infarct or ischemia.      Recent Labs: 07/26/2021: TSH 1.450 12/31/2021: ALT 244; Magnesium  1.8 01/18/2022: BUN 15; Creatinine, Ser 0.66; Hemoglobin 15.3; Platelets 276; Potassium 4.0; Sodium 137   Recent Lipid Panel    Component Value Date/Time   CHOL 161 06/03/2021 1428   TRIG 180 (H) 06/03/2021 1428   HDL 32 (L) 06/03/2021 1428   CHOLHDL 5.0 (H) 06/03/2021 1428   CHOLHDL 4.0 09/01/2016 1607   VLDL 42 (H) 09/01/2016 1607   LDLCALC 98 06/03/2021 1428   LDLDIRECT 97 09/27/2021 1436    Physical Exam:    VS:  BP 120/82    Pulse 81    Ht $R'5\' 3"'ZO$  (1.6 m)    Wt 209 lb (94.8 kg)    LMP  (LMP Unknown)    SpO2 98%    BMI 37.02 kg/m     Wt Readings from Last 3 Encounters:  01/24/22 209 lb (94.8 kg)  01/18/22 209 lb 3.2 oz (94.9 kg)  01/09/22 208 lb 6.4 oz (94.5 kg)     GEN: Well nourished, well developed in no acute distress.  Obese HEENT: Normal NECK: No JVD; No carotid bruits LYMPHATICS: No lymphadenopathy CARDIAC: Tachycardic, regular rhythm, no murmurs, rubs, gallops RESPIRATORY:  Clear to auscultation without rales, wheezing or rhonchi  ABDOMEN: Soft, non-tender, non-distended MUSCULOSKELETAL:  No edema; No deformity  SKIN: Warm and dry NEUROLOGIC:  Alert and oriented x 3 PSYCHIATRIC:  Normal affect       ASSESSMENT:    1. Atypical atrial flutter ([place])   2. Atrial myxoma    PLAN:    In order of problems listed above:  #Atypical atrial flutter Symptomatic.  I suspect this is an incisional flutter although I cannot exclude a typical atrial flutter. I discussed the treatment options including antiarrhythmic drugs, cardioversion alone and catheter ablation.  Given her young age, I would favor an EP study to see if the flutter circuit is able to be addressed with catheter ablation for more definitive therapy. This would also allow [place] to avoid long term exposure to antiarrhythmics.   Will plan for a CT pre op to assess atrial anatomy.  She needs an echo pre op as well.  Will cancel the cardioversion for tomorrow so that I can map her atrial flutter  circuit.  She will continue the apixaban.  Risk, benefits, and alternatives to EP study and radiofrequency ablation for atrial flutter were also discussed in detail today. These risks include but are not limited to stroke, bleeding, vascular  damage, tamponade, perforation, damage to the esophagus, lungs, and other structures, pulmonary vein stenosis, worsening renal function, and death. The patient understands these risk and wishes to proceed.  We will therefore proceed with catheter ablation at the next available time.  Carto, ICE, anesthesia are requested for the procedure.  Will also obtain CT PV protocol prior to the procedure to exclude LAA thrombus and further evaluate atrial anatomy.   Interpreter services were used during today's visit.    Total time spent with patient today 65 minutes. This includes reviewing records, evaluating the patient and coordinating care.  Medication Adjustments/Labs and Tests Ordered: Current medicines are reviewed at length with the patient today.  Concerns regarding medicines are outlined above.   No orders of the defined types were placed in this encounter.  No orders of the defined types were placed in this encounter.  I,Mathew Stumpf,acting as a Education administrator for Vickie Epley, MD.,have documented all relevant documentation on the behalf of Vickie Epley, MD,as directed by  Vickie Epley, MD while in the presence of Vickie Epley, MD.  I, Vickie Epley, MD, have reviewed all documentation for this visit. The documentation on 01/24/22 for the exam, diagnosis, procedures, and orders are all accurate and complete.   Signed, Hilton Cork. Quentin Ore, MD, [facility], [facility] 01/24/2022 10:20 AM    Electrophysiology [facility]

## 2022-01-24 NOTE — Progress Notes (Signed)
Electrophysiology Office Note:    Date:  01/24/2022   ID:  Debra Diaz, DOB [birth year], MRN [id number]  PCP:  Carollee Leitz, MD  [facility] Cardiologist:  Dorris Carnes, MD  [facility] Electrophysiologist:  Vickie Epley, MD   Referring MD: Carollee Leitz, MD   Chief Complaint: Atrial flutter  History of Present Illness:    Debra Diaz is a 46 y.o. female who presents for an evaluation of new onset atrial flutter at the request of Dr. Percival Spanish. Their medical history includes bradycardia, hypertension, hyperlipidemia, hypokalemia, diabetes, anemia, and morbid obesity.   History of myxoma post excision in 2016 Saw Dr. 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  This was performed via a right mini anterolateral thoracotomy.  During the procedure that the mass was very large and an incision was created along the back wall of both the left and right atrium.  There was full-thickness resection of the left atrial wall from the fossa ovalis along the medial surface of the left atrium towards the anterior leaflet of the mitral valve.  Interpretation was performed via video services today. She reports a "little bit" of chest heaviness and is overall feeling very fatigued.  We discussed her cardioversion procedure at length, including risks. This is currently scheduled for tomorrow.  No bleeding issues on Eliquis.  She denies any palpitations, chest pain, or shortness of breath. No lightheadedness, headaches, syncope, orthopnea, PND, or lower extremity edema.      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 and 2011   x 2   LEFT HEART CATHETERIZATION WITH CORONARY ANGIOGRAM N/A 03/02/2015   Procedure: LEFT HEART CATHETERIZATION WITH CORONARY ANGIOGRAM;  Surgeon: [name], MD;  Location: [facility] C[facility] LAB;  Service: Cardiovascular;  Laterality: N/A;   MINIMALLY INVASIVE EXCISION OF ATRIAL MYXOMA N/A 03/04/2015   Procedure: MINIMALLY INVASIVE RESECTION OF LEFT ATRIAL MYXOMA ( USING A BILAYER PATCH CLOSURE);  Surgeon: [name], MD;  Location: [place];  Service: Open Heart Surgery;  Laterality: N/A;   myxoma N/A    chest   TEE WITHOUT CARDIOVERSION N/A 03/01/2015   Procedure: TRANSESOPHAGEAL ECHOCARDIOGRAM (TEE);  Surgeon: [name], MD;  Location: [facility] ENDOSCOPY;  Service: Cardiovascular;  Laterality: N/A;   TEE WITHOUT CARDIOVERSION N/A 03/04/2015   Procedure: TRANSESOPHAGEAL ECHOCARDIOGRAM (TEE);  Surgeon: [name], MD;  Location: [place];  Service: Open Heart Surgery;  Laterality: N/A;   TUBAL LIGATION  2011    Current Medications: Current Meds  Medication Sig   apixaban (ELIQUIS) 5 MG TABS tablet Take 1 tablet (5 mg total) by mouth 2 (two) times daily.   blood glucose meter kit and supplies KIT Dispense based on patient and insurance preference. Use up to four times daily as directed. (FOR ICD-9 250.00, 250.01).   Blood Pressure KIT Check blood pressure twice a day.  Dx code: I10   diltiazem (CARDIZEM) 30 MG tablet Take 1 tablet every 4 hours AS  NEEDED for heart rate >100   glucose blood (ACCU-CHEK GUIDE) test strip Use as instructed   ibuprofen (ADVIL) 200 MG tablet Take 600 mg by mouth every 6 (six) hours as needed for moderate pain or headache.   linaclotide (LINZESS) 145 MCG CAPS capsule Take 1 capsule (145 mcg total) by mouth daily before breakfast. (Patient taking differently: Take 145 mcg by mouth at bedtime.)    losartan-hydrochlorothiazide (HYZAAR) 50-12.5 MG tablet Take 1 tablet by mouth daily.   metFORMIN (GLUCOPHAGE-XR) 500 MG 24 hr tablet Take 2 tablets (1,000 mg total) by mouth in the morning and at bedtime.   polyethylene glycol powder (GLYCOLAX/MIRALAX) 17 GM/SCOOP powder Start with one scoop daily, increase to two scoops daily as needed for soft bowel movement each day.   potassium chloride (KLOR-CON) 10 MEQ tablet TAKE 1 TABLET BY MOUTH ONCE WEEKLY SAME DAY AS DAY AS FUROSEMIDE   Vitamin D, Cholecalciferol, 25 MCG (1000 UT) TABS Take 1,000 Units by mouth daily.     Allergies:   Patient has no known allergies.   Social History   Socioeconomic History   Marital status: Married    Spouse name: Not on file   Number of children: Not on file   Years of education: Not on file   Highest education level: Not on file  Occupational History   Not on file  Tobacco Use   Smoking status: Former    Types: Cigarettes    Quit date: 11/17/2017    Years since quitting: 4.1   Smokeless tobacco: Never   Tobacco comments:    Former smoker 01/09/22  Vaping Use   Vaping Use: Never used  Substance and Sexual Activity   Alcohol use: Yes    Comment: occasional   Drug use: No   Sexual activity: Yes    Birth control/protection: Surgical  Other Topics Concern   Not on file  Social History Narrative   Not on file   Social Determinants of Health   Financial Resource Strain: Not on file  Food Insecurity: Not on file  Transportation Needs: Not on file  Physical Activity: Not on file  Stress: Not on file  Social Connections: Not on file     Family History: The patient's family history includes Asthma in her son; Breast cancer in her maternal grandmother; Diabetes in her father; Heart disease in her mother; Hypertension in her mother; Stomach cancer in her maternal grandfather. There is no history of Colon cancer or Rectal cancer.  ROS:   Please see the history of present illness.    (+) Chest  heaviness (+) Fatigue All other systems reviewed and are negative.  EKGs/Labs/Other Studies Reviewed:    The following studies were reviewed today:  09/06/2021 ETT:   No ST deviation was noted.   Prior study not available for comparison.   Hypertensive blood pressure response.   No ischemic changes noted at sub-target heart rate.   No ischemic changes noted, but patient did not reach target heart rate.  Therefore, test is not diagnostic.  Consider functional testing or coronary CT-A if clinically indicated.  08/2021 Monitor: Patch Wear Time:  5 days and 21 hours (2022-08-11T19:19:34-0400 to 2022-08-17T16:35:17-0400)   Rhythm:  Sinus   Rates  28 to 115 bpm   Average HR 49 bpm   Rare PVC, PAC      Triggered events did not correlate with arrhythmias or extremes in HR.    08/11/2021 Echo  1. Left ventricular ejection fraction, by estimation, is 60  to 65%. Left  ventricular ejection fraction by 3D volume is 63 %. The left ventricle has  normal function. The left ventricle has no regional wall motion  abnormalities. Left ventricular diastolic   parameters were normal.   2. Right ventricular systolic function is normal. The right ventricular  size is normal. There is normal pulmonary artery systolic pressure.   3. Left atrial size was moderately dilated.   4. Right atrial size was mildly dilated.   5. The mitral valve is normal in structure. No evidence of mitral valve  regurgitation.   6. The aortic valve is normal in structure. Aortic valve regurgitation is  not visualized. No aortic stenosis is present.   09/13/2016 Lexiscan Myoview: Nuclear stress EF: 58%. There was no ST segment deviation noted during stress. The study is normal. This is a low risk study. The left ventricular ejection fraction is normal (55-65%).   Normal pharmacologic nuclear stress test with no evidence of prior infarct or ischemia.      Recent Labs: 07/26/2021: TSH 1.450 12/31/2021: ALT 244; Magnesium  1.8 01/18/2022: BUN 15; Creatinine, Ser 0.66; Hemoglobin 15.3; Platelets 276; Potassium 4.0; Sodium 137   Recent Lipid Panel    Component Value Date/Time   CHOL 161 06/03/2021 1428   TRIG 180 (H) 06/03/2021 1428   HDL 32 (L) 06/03/2021 1428   CHOLHDL 5.0 (H) 06/03/2021 1428   CHOLHDL 4.0 09/01/2016 1607   VLDL 42 (H) 09/01/2016 1607   LDLCALC 98 06/03/2021 1428   LDLDIRECT 97 09/27/2021 1436    Physical Exam:    VS:  BP 120/82    Pulse 81    Ht $R'5\' 3"'qV$  (1.6 m)    Wt 209 lb (94.8 kg)    LMP  (LMP Unknown)    SpO2 98%    BMI 37.02 kg/m     Wt Readings from Last 3 Encounters:  01/24/22 209 lb (94.8 kg)  01/18/22 209 lb 3.2 oz (94.9 kg)  01/09/22 208 lb 6.4 oz (94.5 kg)     GEN: Well nourished, well developed in no acute distress.  Obese HEENT: Normal NECK: No JVD; No carotid bruits LYMPHATICS: No lymphadenopathy CARDIAC: Tachycardic, regular rhythm, no murmurs, rubs, gallops RESPIRATORY:  Clear to auscultation without rales, wheezing or rhonchi  ABDOMEN: Soft, non-tender, non-distended MUSCULOSKELETAL:  No edema; No deformity  SKIN: Warm and dry NEUROLOGIC:  Alert and oriented x 3 PSYCHIATRIC:  Normal affect       ASSESSMENT:    1. Atypical atrial flutter ([place])   2. Atrial myxoma    PLAN:    In order of problems listed above:  #Atypical atrial flutter Symptomatic.  I suspect this is an incisional flutter although I cannot exclude a typical atrial flutter. I discussed the treatment options including antiarrhythmic drugs, cardioversion alone and catheter ablation.  Given her young age, I would favor an EP study to see if the flutter circuit is able to be addressed with catheter ablation for more definitive therapy. This would also allow [place] to avoid long term exposure to antiarrhythmics.   Will plan for a CT pre op to assess atrial anatomy.  She needs an echo pre op as well.  Will cancel the cardioversion for tomorrow so that I can map her atrial flutter  circuit.  She will continue the apixaban.  Risk, benefits, and alternatives to EP study and radiofrequency ablation for atrial flutter were also discussed in detail today. These risks include but are not limited to stroke, bleeding, vascular  damage, tamponade, perforation, damage to the esophagus, lungs, and other structures, pulmonary vein stenosis, worsening renal function, and death. The patient understands these risk and wishes to proceed.  We will therefore proceed with catheter ablation at the next available time.  Carto, ICE, anesthesia are requested for the procedure.  Will also obtain CT PV protocol prior to the procedure to exclude LAA thrombus and further evaluate atrial anatomy.   Interpreter services were used during today's visit.    Total time spent with patient today 65 minutes. This includes reviewing records, evaluating the patient and coordinating care.  Medication Adjustments/Labs and Tests Ordered: Current medicines are reviewed at length with the patient today.  Concerns regarding medicines are outlined above.   No orders of the defined types were placed in this encounter.  No orders of the defined types were placed in this encounter.  I,Mathew Stumpf,acting as a Education administrator for Vickie Epley, MD.,have documented all relevant documentation on the behalf of Vickie Epley, MD,as directed by  Vickie Epley, MD while in the presence of Vickie Epley, MD.  I, Vickie Epley, MD, have reviewed all documentation for this visit. The documentation on 01/24/22 for the exam, diagnosis, procedures, and orders are all accurate and complete.   Signed, Hilton Cork. Quentin Ore, MD, [facility], [facility] 01/24/2022 10:20 AM    Electrophysiology [facility]

## 2022-01-24 NOTE — Patient Instructions (Addendum)
Medication Instructions:  Your physician recommends that you continue on your current medications as directed. Please refer to the Current Medication list given to you today. *If you need a refill on your cardiac medications before your next appointment, please call your pharmacy*  Lab Work: CBC, BMP If you have labs (blood work) drawn today and your tests are completely normal, you will receive your results only by: [place] (if you have MyChart) OR A paper copy in the mail If you have any lab test that is abnormal or we need to change your treatment, we will call you to review the results.  Testing/Procedures:  Your physician has requested that you have an echocardiogram. Echocardiography is a painless test that uses sound waves to create images of your heart. It provides your doctor with information about the size and shape of your heart and how well your hearts chambers and valves are working. This procedure takes approximately one hour. There are no restrictions for this procedure.  Your physician has requested that you have cardiac CT. Cardiac computed tomography (CT) is a painless test that uses an x-ray machine to take clear, detailed pictures of your heart. For further information please visit [email or web address]. Please follow instruction sheet as given.   Your physician has recommended that you have an ablation. Catheter ablation is a medical procedure used to treat some cardiac arrhythmias (irregular heartbeats). During catheter ablation, a long, thin, flexible tube is put into a blood vessel in your groin (upper thigh), or neck. This tube is called an ablation catheter. It is then guided to your heart through the blood vessel. Radio frequency waves destroy small areas of heart tissue where abnormal heartbeats may cause an arrhythmia to start. Please see the instruction sheet given to you today.   Follow-Up: At [facility], you and your health needs are our priority.  As  part of our continuing mission to provide you with exceptional heart care, we have created designated Provider Care Teams.  These Care Teams include your primary Cardiologist (physician) and Advanced Practice Providers (APPs -  Physician Assistants and Nurse Practitioners) who all work together to provide you with the care you need, when you need it.  Your physician wants you to follow-up in: See instruction letter.   We recommend signing up for the patient portal called "MyChart".  Sign up information is provided on this After Visit Summary.  MyChart is used to connect with patients for Virtual Visits (Telemedicine).  Patients are able to view lab/test results, encounter notes, upcoming appointments, etc.  Non-urgent messages can be sent to your provider as well.   To learn more about what you can do with MyChart, go to [email or web address].    Any Other Special Instructions Will Be Listed Below (If Applicable).  Cardiac Ablation Cardiac ablation is a procedure to destroy (ablate) some heart tissue that is sending bad signals. These bad signals cause problems in heart rhythm. The heart has many areas that make these signals. If there are problems in these areas, they can make the heart beat in a way that is not normal. Destroying some tissues can help make the heart rhythm normal. Tell your doctor about: Any allergies you have. All medicines you are taking. These include vitamins, herbs, eye drops, creams, and over-the-counter medicines. Any problems you or family members have had with medicines that make you fall asleep (anesthetics). Any blood disorders you have. Any surgeries you have had. Any medical conditions you have, such as kidney failure. Whether  you are pregnant or may be pregnant. What are the risks? This is a safe procedure. But problems may occur, including: Infection. Bruising and bleeding. Bleeding into the chest. Stroke or blood clots. Damage to nearby areas of your  body. Allergies to medicines or dyes. The need for a pacemaker if the normal system is damaged. Failure of the procedure to treat the problem. What happens before the procedure? Medicines Ask your doctor about: Changing or stopping your normal medicines. This is important. Taking aspirin and ibuprofen. Do not take these medicines unless your doctor tells you to take them. Taking other medicines, vitamins, herbs, and supplements. General instructions Follow instructions from your doctor about what you cannot eat or drink. Plan to have someone take you home from the hospital or clinic. If you will be going home right after the procedure, plan to have someone with you for 24 hours. Ask your doctor what steps will be taken to prevent infection. What happens during the procedure?  An IV tube will be put into one of your veins. You will be given a medicine to help you relax. The skin on your neck or groin will be numbed. A cut (incision) will be made in your neck or groin. A needle will be put through your cut and into a large vein. A tube (catheter) will be put into the needle. The tube will be moved to your heart. Dye may be put through the tube. This helps your doctor see your heart. Small devices (electrodes) on the tube will send out signals. A type of energy will be used to destroy some heart tissue. The tube will be taken out. Pressure will be held on your cut. This helps stop bleeding. A bandage will be put over your cut. The exact procedure may vary among doctors and hospitals. What happens after the procedure? You will be watched until you leave the hospital or clinic. This includes checking your heart rate, breathing rate, oxygen, and blood pressure. Your cut will be watched for bleeding. You will need to lie still for a few hours. Do not drive for 24 hours or as long as your doctor tells you. Summary Cardiac ablation is a procedure to destroy some heart tissue. This is done to  treat heart rhythm problems. Tell your doctor about any medical conditions you may have. Tell him or her about all medicines you are taking to treat them. This is a safe procedure. But problems may occur. These include infection, bruising, bleeding, and damage to nearby areas of your body. Follow what your doctor tells you about food and drink. You may also be told to change or stop some of your medicines. After the procedure, do not drive for 24 hours or as long as your doctor tells you. This information is not intended to replace advice given to you by your health care provider. Make sure you discuss any questions you have with your health care provider. Document Revised: 11/06/2019 Document Reviewed: 11/06/2019 Elsevier Patient Education  2022 [employer].

## 2022-01-24 NOTE — Progress Notes (Deleted)
History of myxoma post excision in 2016 Saw Dr. 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  This was performed via a right mini anterolateral thoracotomy.  During the procedure that the mass was very large and an incision was created along the back wall of both the left and right atrium.  There was full-thickness resection of the left atrial wall from the fossa ovalis along the medial surface of the left atrium towards the anterior leaflet of the mitral valve.     Cardioversion versus ablation.  If she wants to be ablated, do not cardiovert.

## 2022-01-25 ENCOUNTER — Ambulatory Visit (HOSPITAL_COMMUNITY): Admission: RE | Admit: 2022-01-25 | Payer: Medicaid Other | Source: Home / Self Care | Admitting: Internal Medicine

## 2022-01-25 SURGERY — CARDIOVERSION
Anesthesia: General

## 2022-02-02 ENCOUNTER — Other Ambulatory Visit: Payer: Self-pay

## 2022-02-02 ENCOUNTER — Ambulatory Visit (HOSPITAL_COMMUNITY): Payer: Medicaid Other | Attending: Internal Medicine

## 2022-02-02 ENCOUNTER — Other Ambulatory Visit (HOSPITAL_COMMUNITY): Payer: Self-pay | Admitting: Physician Assistant

## 2022-02-02 DIAGNOSIS — I484 Atypical atrial flutter: Secondary | ICD-10-CM | POA: Diagnosis not present

## 2022-02-02 DIAGNOSIS — Z01810 Encounter for preprocedural cardiovascular examination: Secondary | ICD-10-CM | POA: Diagnosis not present

## 2022-02-02 DIAGNOSIS — D151 Benign neoplasm of heart: Secondary | ICD-10-CM | POA: Diagnosis not present

## 2022-02-02 DIAGNOSIS — Z01818 Encounter for other preprocedural examination: Secondary | ICD-10-CM | POA: Diagnosis present

## 2022-02-02 LAB — ECHOCARDIOGRAM COMPLETE
Area-P 1/2: 4.8 cm2
S' Lateral: 2.7 cm

## 2022-02-13 ENCOUNTER — Telehealth (HOSPITAL_COMMUNITY): Payer: Self-pay | Admitting: Emergency Medicine

## 2022-02-13 NOTE — Telephone Encounter (Signed)
Attempted to call patient regarding upcoming cardiac CT appointment. °Left message on voicemail with name and callback number °Flonnie Wierman RN Navigator Cardiac Imaging °[facility] °[phone] Office °[phone] Cell ° °

## 2022-02-14 ENCOUNTER — Encounter (HOSPITAL_COMMUNITY): Payer: Self-pay

## 2022-02-14 ENCOUNTER — Ambulatory Visit (HOSPITAL_COMMUNITY): Payer: Medicaid Other

## 2022-02-14 ENCOUNTER — Other Ambulatory Visit: Payer: Self-pay

## 2022-02-14 ENCOUNTER — Ambulatory Visit (HOSPITAL_COMMUNITY)
Admission: RE | Admit: 2022-02-14 | Discharge: 2022-02-14 | Disposition: A | Discharge status: Home / Self Care | Payer: Medicaid Other | Source: Ambulatory Visit | Attending: Cardiology | Admitting: Cardiology

## 2022-02-14 DIAGNOSIS — I484 Atypical atrial flutter: Secondary | ICD-10-CM

## 2022-02-14 DIAGNOSIS — D151 Benign neoplasm of heart: Secondary | ICD-10-CM | POA: Diagnosis present

## 2022-02-14 DIAGNOSIS — Z01818 Encounter for other preprocedural examination: Secondary | ICD-10-CM | POA: Insufficient documentation

## 2022-02-14 MED ORDER — DILTIAZEM HCL 25 MG/5ML IV SOLN: 10.0000 mg | Freq: Once | INTRAVENOUS | Status: AC

## 2022-02-14 MED ORDER — IOHEXOL 350 MG/ML SOLN
100.0000 mL | Freq: Once | INTRAVENOUS | Status: AC | PRN
  Administered 2022-02-14: 100 mL via INTRAVENOUS

## 2022-02-14 MED ORDER — DILTIAZEM HCL 25 MG/5ML IV SOLN
INTRAVENOUS | Status: AC
  Administered 2022-02-14: 10 mg via INTRAVENOUS
  Filled 2022-02-14: qty 5

## 2022-02-16 ENCOUNTER — Other Ambulatory Visit: Payer: Self-pay

## 2022-02-16 ENCOUNTER — Encounter (HOSPITAL_COMMUNITY): Payer: Self-pay | Admitting: Cardiology

## 2022-02-16 ENCOUNTER — Ambulatory Visit (HOSPITAL_COMMUNITY): Payer: Medicaid Other | Admitting: Certified Registered Nurse Anesthetist

## 2022-02-16 ENCOUNTER — Ambulatory Visit (HOSPITAL_COMMUNITY)
Admission: RE | Admit: 2022-02-16 | Discharge: 2022-02-17 | Disposition: A | Discharge status: Home / Self Care | Payer: Medicaid Other | Attending: Cardiology | Admitting: Cardiology

## 2022-02-16 ENCOUNTER — Encounter (HOSPITAL_COMMUNITY)
Admission: RE | Disposition: A | Discharge status: Home / Self Care | Payer: Medicaid Other | Source: Home / Self Care | Attending: Cardiology

## 2022-02-16 ENCOUNTER — Ambulatory Visit (HOSPITAL_BASED_OUTPATIENT_CLINIC_OR_DEPARTMENT_OTHER): Payer: Medicaid Other | Admitting: Certified Registered Nurse Anesthetist

## 2022-02-16 DIAGNOSIS — I484 Atypical atrial flutter: Secondary | ICD-10-CM | POA: Insufficient documentation

## 2022-02-16 DIAGNOSIS — Z7984 Long term (current) use of oral hypoglycemic drugs: Secondary | ICD-10-CM | POA: Insufficient documentation

## 2022-02-16 DIAGNOSIS — D649 Anemia, unspecified: Secondary | ICD-10-CM | POA: Diagnosis not present

## 2022-02-16 DIAGNOSIS — I4892 Unspecified atrial flutter: Secondary | ICD-10-CM

## 2022-02-16 DIAGNOSIS — I4891 Unspecified atrial fibrillation: Secondary | ICD-10-CM

## 2022-02-16 DIAGNOSIS — Z86018 Personal history of other benign neoplasm: Secondary | ICD-10-CM | POA: Diagnosis not present

## 2022-02-16 DIAGNOSIS — I483 Typical atrial flutter: Secondary | ICD-10-CM

## 2022-02-16 DIAGNOSIS — E785 Hyperlipidemia, unspecified: Secondary | ICD-10-CM | POA: Diagnosis not present

## 2022-02-16 DIAGNOSIS — Z6837 Body mass index (BMI) 37.0-37.9, adult: Secondary | ICD-10-CM | POA: Insufficient documentation

## 2022-02-16 DIAGNOSIS — Z7901 Long term (current) use of anticoagulants: Secondary | ICD-10-CM | POA: Diagnosis not present

## 2022-02-16 DIAGNOSIS — Z20822 Contact with and (suspected) exposure to covid-19: Secondary | ICD-10-CM | POA: Diagnosis not present

## 2022-02-16 DIAGNOSIS — I495 Sick sinus syndrome: Secondary | ICD-10-CM | POA: Insufficient documentation

## 2022-02-16 DIAGNOSIS — Z87891 Personal history of nicotine dependence: Secondary | ICD-10-CM | POA: Insufficient documentation

## 2022-02-16 DIAGNOSIS — E119 Type 2 diabetes mellitus without complications: Secondary | ICD-10-CM

## 2022-02-16 DIAGNOSIS — I10 Essential (primary) hypertension: Secondary | ICD-10-CM | POA: Diagnosis not present

## 2022-02-16 DIAGNOSIS — E876 Hypokalemia: Secondary | ICD-10-CM | POA: Insufficient documentation

## 2022-02-16 HISTORY — DX: Unspecified atrial flutter: I48.92

## 2022-02-16 HISTORY — PX: A-FLUTTER ABLATION: EP1230

## 2022-02-16 LAB — GLUCOSE, CAPILLARY
Glucose-Capillary: 148 mg/dL — ABNORMAL HIGH (ref 70–99)
Glucose-Capillary: 117 mg/dL — ABNORMAL HIGH (ref 70–99)
Glucose-Capillary: 186 mg/dL — ABNORMAL HIGH (ref 70–99)

## 2022-02-16 LAB — POCT ACTIVATED CLOTTING TIME
Activated Clotting Time: 414 seconds
Activated Clotting Time: 347 seconds

## 2022-02-16 LAB — MRSA NEXT GEN BY PCR, NASAL: MRSA by PCR Next Gen: NOT DETECTED

## 2022-02-16 SURGERY — A-FLUTTER ABLATION
Anesthesia: General

## 2022-02-16 MED ORDER — SODIUM CHLORIDE 0.9 % IV SOLN: INTRAVENOUS | Status: DC

## 2022-02-16 MED ORDER — HEPARIN SODIUM (PORCINE) 1000 UNIT/ML IJ SOLN
INTRAMUSCULAR | Status: AC
  Filled 2022-02-16: qty 10

## 2022-02-16 MED ORDER — HEPARIN (PORCINE) IN NACL 1000-0.9 UT/500ML-% IV SOLN
INTRAVENOUS | Status: AC
  Filled 2022-02-16: qty 500

## 2022-02-16 MED ORDER — ISOPROTERENOL HCL 0.2 MG/ML IJ SOLN
INTRAVENOUS | Status: DC | PRN
  Administered 2022-02-16: 2 ug/min via INTRAVENOUS

## 2022-02-16 MED ORDER — HEPARIN SODIUM (PORCINE) 1000 UNIT/ML IJ SOLN
INTRAMUSCULAR | Status: DC | PRN
  Administered 2022-02-16: 15000 [IU] via INTRAVENOUS

## 2022-02-16 MED ORDER — OXYCODONE HCL 5 MG PO TABS
5.0000 mg | ORAL_TABLET | Freq: Once | ORAL | Status: AC
  Administered 2022-02-16: 5 mg via ORAL
  Filled 2022-02-16: qty 1

## 2022-02-16 MED ORDER — PROPOFOL 10 MG/ML IV BOLUS
INTRAVENOUS | Status: DC | PRN
Start: 2022-02-16 — End: 2022-02-16
  Administered 2022-02-16: 150 mg via INTRAVENOUS

## 2022-02-16 MED ORDER — HEPARIN SODIUM (PORCINE) 1000 UNIT/ML IJ SOLN
INTRAMUSCULAR | Status: DC | PRN
  Administered 2022-02-16: 1000 [IU] via INTRAVENOUS

## 2022-02-16 MED ORDER — ISOPROTERENOL HCL 0.2 MG/ML IJ SOLN
INTRAMUSCULAR | Status: AC
  Filled 2022-02-16: qty 5

## 2022-02-16 MED ORDER — PHENYLEPHRINE 40 MCG/ML (10ML) SYRINGE FOR IV PUSH (FOR BLOOD PRESSURE SUPPORT)
PREFILLED_SYRINGE | INTRAVENOUS | Status: DC | PRN
  Administered 2022-02-16: 120 ug via INTRAVENOUS
  Administered 2022-02-16: 80 ug via INTRAVENOUS

## 2022-02-16 MED ORDER — FENTANYL CITRATE (PF) 250 MCG/5ML IJ SOLN
INTRAMUSCULAR | Status: DC | PRN
Start: 2022-02-16 — End: 2022-02-16

## 2022-02-16 MED ORDER — ONDANSETRON HCL 4 MG/2ML IJ SOLN
4.0000 mg | Freq: Four times a day (QID) | INTRAMUSCULAR | Status: DC | PRN
  Administered 2022-02-16: 4 mg via INTRAVENOUS

## 2022-02-16 MED ORDER — SODIUM CHLORIDE 0.9 % IV SOLN: 250.0000 mL | INTRAVENOUS | Status: DC | PRN

## 2022-02-16 MED ORDER — LIDOCAINE 2% (20 MG/ML) 5 ML SYRINGE
INTRAMUSCULAR | Status: DC | PRN
Start: 2022-02-16 — End: 2022-02-16
  Administered 2022-02-16: 60 mg via INTRAVENOUS

## 2022-02-16 MED ORDER — LOSARTAN POTASSIUM-HCTZ 50-12.5 MG PO TABS
1.0000 | ORAL_TABLET | Freq: Every day | ORAL | Status: DC
Start: 2022-02-17 — End: 2022-02-16

## 2022-02-16 MED ORDER — HEPARIN (PORCINE) IN NACL 1000-0.9 UT/500ML-% IV SOLN
INTRAVENOUS | Status: DC | PRN
  Administered 2022-02-16 (×5): 500 mL

## 2022-02-16 MED ORDER — LOSARTAN POTASSIUM 50 MG PO TABS
50.0000 mg | ORAL_TABLET | Freq: Every day | ORAL | Status: DC
  Administered 2022-02-17: 50 mg via ORAL
  Filled 2022-02-16: qty 1

## 2022-02-16 MED ORDER — APIXABAN 5 MG PO TABS
5.0000 mg | ORAL_TABLET | Freq: Two times a day (BID) | ORAL | Status: DC
  Administered 2022-02-16 – 2022-02-17 (×2): 5 mg via ORAL
  Filled 2022-02-16 (×2): qty 1

## 2022-02-16 MED ORDER — DEXAMETHASONE SODIUM PHOSPHATE 10 MG/ML IJ SOLN
INTRAMUSCULAR | Status: DC | PRN
  Administered 2022-02-16: 10 mg via INTRAVENOUS

## 2022-02-16 MED ORDER — PHENYLEPHRINE HCL-NACL 20-0.9 MG/250ML-% IV SOLN
INTRAVENOUS | Status: DC | PRN
  Administered 2022-02-16: 25 ug/min via INTRAVENOUS

## 2022-02-16 MED ORDER — POTASSIUM CHLORIDE CRYS ER 10 MEQ PO TBCR
10.0000 meq | EXTENDED_RELEASE_TABLET | Freq: Every day | ORAL | Status: DC
  Administered 2022-02-17: 10 meq via ORAL
  Filled 2022-02-16 (×2): qty 1

## 2022-02-16 MED ORDER — PROTAMINE SULFATE 10 MG/ML IV SOLN
INTRAVENOUS | Status: DC | PRN
  Administered 2022-02-16: 15 mg via INTRAVENOUS
  Administered 2022-02-16 (×2): 10 mg via INTRAVENOUS

## 2022-02-16 MED ORDER — SUGAMMADEX SODIUM 200 MG/2ML IV SOLN
INTRAVENOUS | Status: DC | PRN
  Administered 2022-02-16: 200 mg via INTRAVENOUS

## 2022-02-16 MED ORDER — MIDAZOLAM HCL 5 MG/5ML IJ SOLN
INTRAMUSCULAR | Status: DC | PRN
  Administered 2022-02-16: 2 mg via INTRAVENOUS

## 2022-02-16 MED ORDER — FENTANYL CITRATE (PF) 250 MCG/5ML IJ SOLN
INTRAMUSCULAR | Status: DC | PRN
Start: 2022-02-16 — End: 2022-02-16
  Administered 2022-02-16: 100 ug via INTRAVENOUS

## 2022-02-16 MED ORDER — ONDANSETRON HCL 4 MG/2ML IJ SOLN
INTRAMUSCULAR | Status: AC
  Filled 2022-02-16: qty 2

## 2022-02-16 MED ORDER — SODIUM CHLORIDE 0.9% FLUSH: 3.0000 mL | INTRAVENOUS | Status: DC | PRN

## 2022-02-16 MED ORDER — ACETAMINOPHEN 325 MG PO TABS
650.0000 mg | ORAL_TABLET | ORAL | Status: DC | PRN
  Administered 2022-02-16 – 2022-02-17 (×3): 650 mg via ORAL
  Filled 2022-02-16 (×4): qty 2

## 2022-02-16 MED ORDER — ROCURONIUM BROMIDE 10 MG/ML (PF) SYRINGE
PREFILLED_SYRINGE | INTRAVENOUS | Status: DC | PRN
Start: 2022-02-16 — End: 2022-02-16
  Administered 2022-02-16: 70 mg via INTRAVENOUS
  Administered 2022-02-16: 10 mg via INTRAVENOUS

## 2022-02-16 MED ORDER — HYDROCHLOROTHIAZIDE 12.5 MG PO TABS
12.5000 mg | ORAL_TABLET | Freq: Every day | ORAL | Status: DC
  Administered 2022-02-17: 12.5 mg via ORAL
  Filled 2022-02-16: qty 1

## 2022-02-16 MED ORDER — ONDANSETRON HCL 4 MG/2ML IJ SOLN
INTRAMUSCULAR | Status: DC | PRN
  Administered 2022-02-16: 4 mg via INTRAVENOUS

## 2022-02-16 MED ORDER — SODIUM CHLORIDE 0.9% FLUSH
3.0000 mL | Freq: Two times a day (BID) | INTRAVENOUS | Status: DC
  Administered 2022-02-16 – 2022-02-17 (×2): 3 mL via INTRAVENOUS

## 2022-02-16 SURGICAL SUPPLY — 19 items
BAG SNAP BAND KOVER 36X36 (MISCELLANEOUS) ×1 IMPLANT
CATH OCTARAY 1.5 F (CATHETERS) ×1 IMPLANT
CATH S CIRCA THERM PROBE 10F (CATHETERS) ×1 IMPLANT
CATH SMTCH THERMOCOOL SF DF (CATHETERS) ×1 IMPLANT
CATH SOUNDSTAR ECO 8FR (CATHETERS) ×1 IMPLANT
CATH WEBSTER BI DIR CS D-F CRV (CATHETERS) ×1 IMPLANT
CLOSURE PERCLOSE PROSTYLE (VASCULAR PRODUCTS) ×6 IMPLANT
ELECT DEFIB PAD ADLT CADENCE (PAD) ×2 IMPLANT
INTRODUCER SWARTZ SRO 8F (SHEATH) ×1 IMPLANT
PACK EP LATEX FREE (CUSTOM PROCEDURE TRAY) ×1
PACK EP LF (CUSTOM PROCEDURE TRAY) ×1 IMPLANT
PAD DEFIB RADIO PHYSIO CONN (PAD) ×2 IMPLANT
PATCH CARTO3 (PAD) ×1 IMPLANT
SHEATH BAYLIS TRANSSEPTAL 98CM (NEEDLE) ×1 IMPLANT
SHEATH CARTO VIZIGO SM CVD (SHEATH) ×1 IMPLANT
SHEATH PINNACLE 8F 10CM (SHEATH) ×3 IMPLANT
SHEATH PINNACLE 9F 10CM (SHEATH) ×1 IMPLANT
SHEATH PROBE COVER 6X72 (BAG) ×1 IMPLANT
TUBING SMART ABLATE COOLFLOW (TUBING) ×1 IMPLANT

## 2022-02-16 NOTE — Plan of Care (Incomplete)
Pt transferred from cath lab post ablation w/ heart block, rates 40-60, for observation overnight.  Bilateral groin sites accessed.  Both sites level zero with gauze and tegaderm.   ?Problem: Education: ?Goal: Knowledge of General Education information will improve ?Description: Including pain rating scale, medication(s)/side effects and non-pharmacologic comfort measures ?Outcome: Not Progressing ?  ?

## 2022-02-16 NOTE — Progress Notes (Addendum)
Pt with c/o nausea, minimal clear spit noted in emesis bag, bed placed in reverse trendelenburg position, cool wash cloth given, see MAR for meds given, interpreter remains at bedside, safety maintained, Dr Quentin Ore to bedside at approximately 1700, made aware of nausea and med given ? ?

## 2022-02-16 NOTE — Anesthesia Procedure Notes (Signed)
Procedure Name: Intubation ?Date/Time: 02/16/2022 2:35 PM ?Performed by: Jenne Campus, CRNA ?Pre-anesthesia Checklist: Patient identified, Emergency Drugs available, Suction available and Patient being monitored ?Patient Re-evaluated:Patient Re-evaluated prior to induction ?Oxygen Delivery Method: Circle System Utilized ?Preoxygenation: Pre-oxygenation with 100% oxygen ?Induction Type: IV induction ?Ventilation: Mask ventilation without difficulty ?Laryngoscope Size: Sabra Heck and 2 ?Grade View: Grade II ?Tube type: Oral ?Tube size: 7.0 mm ?Number of attempts: 1 ?Airway Equipment and Method: Stylet ?Placement Confirmation: ETT inserted through vocal cords under direct vision, positive ETCO2 and breath sounds checked- equal and bilateral ?Secured at: 22 cm ?Tube secured with: Tape ?Dental Injury: Teeth and Oropharynx as per pre-operative assessment  ? ? ? ? ?

## 2022-02-16 NOTE — Anesthesia Preprocedure Evaluation (Addendum)
Anesthesia Evaluation  ?Patient identified by MRN, date of birth, ID band ?Patient awake ? ? ? ?Reviewed: ?Allergy & Precautions, NPO status , Patient's Chart, lab work & pertinent test results ? ?Airway ?Mallampati: II ? ?TM Distance: >3 FB ?Neck ROM: Full ? ? ? Dental ? ?(+) Dental Advisory Given, Teeth Intact,  ?  ?Pulmonary ?neg pulmonary ROS, former smoker,  ?  ?Pulmonary exam normal ? ? ? ? ? ? ? Cardiovascular ?hypertension, Pt. on medications ?+ dysrhythmias Atrial Fibrillation  ?Rhythm:Irregular Rate:Normal ? ?S/p myxoma resection 2016 ?  ?Neuro/Psych ? Headaches, Anxiety   ? GI/Hepatic ?negative GI ROS, Neg liver ROS,   ?Endo/Other  ?diabetes, Type 2, Oral Hypoglycemic Agents ? Renal/GU ?negative Renal ROS  ?negative genitourinary ?  ?Musculoskeletal ?negative musculoskeletal ROS ?(+)  ? Abdominal ?Normal abdominal exam  (+)   ?Peds ? Hematology ? ?(+) Blood dyscrasia, anemia ,   ?Anesthesia Other Findings ? ? Reproductive/Obstetrics ? ?  ? ? ? ? ? ? ? ? ? ? ? ? ? ?  ?  ? ? ? ? ? ?Anesthesia Physical ?Anesthesia Plan ? ?ASA: 3 ? ?Anesthesia Plan: General  ? ?Post-op Pain Management:   ? ?Induction: Intravenous ? ?PONV Risk Score and Plan: 3 and Ondansetron, Dexamethasone, Midazolam and Treatment may vary due to age or medical condition ? ?Airway Management Planned: Oral ETT ? ?Additional Equipment: None ? ?Intra-op Plan:  ? ?Post-operative Plan: Extubation in OR ? ?Informed Consent: I have reviewed the patients History and Physical, chart, labs and discussed the procedure including the risks, benefits and alternatives for the proposed anesthesia with the patient or authorized representative who has indicated his/her understanding and acceptance.  ? ? ? ?Dental advisory given and Interpreter used for interveiw ? ?Plan Discussed with:  ? ?Anesthesia Plan Comments: (Lab Results ?     Component                Value               Date                 ?     WBC                       10.0                01/24/2022           ?     HGB                      15.6                01/24/2022           ?     HCT                      47.1 (H)            01/24/2022           ?     MCV                      86                  01/24/2022           ?     PLT  319                 01/24/2022           ?Lab Results ?     Component                Value               Date                 ?     NA                       140                 01/24/2022           ?     K                        4.4                 01/24/2022           ?     CO2                      29                  01/24/2022           ?     GLUCOSE                  134 (H)             01/24/2022           ?     BUN                      10                  01/24/2022           ?     CREATININE               0.65                01/24/2022           ?     CALCIUM                  9.9                 01/24/2022           ?     EGFR                     110                 01/24/2022           ?     GFRNONAA                 >60                 12/31/2021          )  ? ? ? ? ?Anesthesia Quick Evaluation ? ?

## 2022-02-16 NOTE — Discharge Instructions (Addendum)
Post procedure care instructions ?No driving for 4 days. No lifting over 5 lbs for 1 week. No vigorous or sexual activity for 1 week. You may return to work/your usual activities on 02/24/22. Keep procedure site clean & dry. If you notice increased pain, swelling, bleeding or pus, call/return!  You may shower after 24 hours, but no soaking in baths/hot tubs/pools for 1 week.  ? ? ?You have an appointment set up with the [facility].  Multiple studies have shown that being followed by a dedicated atrial fibrillation clinic in addition to the standard care you receive from your other physicians improves health. We believe that enrollment in the atrial fibrillation clinic will allow [place] to better care for you.  ? ?The phone number to the [facility] is [phone]. The clinic is staffed Monday through Friday from 8:30am to 5pm. ? ?Parking Directions: The clinic is located in the Heart and Vascular Building connected to [facility]. ?1)From [address] turn on to [address] and go to the 3rd entrance  (Heart and Vascular entrance) on the right. ?2)Look to the right for Heart &Vascular Parking Garage. ?3)A code for the entrance is required, for March is 1102.   ?4)Take the elevators to the 1st floor. Registration is in the room with the glass walls at the end of the hallway. ? ?If you have any trouble parking or locating the clinic, please don?t hesitate to call [phone].  ?

## 2022-02-16 NOTE — Transfer of Care (Signed)
Immediate Anesthesia Transfer of Care Note ? ?Patient: Debra Diaz ? ?Procedure(s) Performed: A-FLUTTER ABLATION ? ?Patient Location: Cath Lab ? ?Anesthesia Type:General ? ?Level of Consciousness: awake, oriented and patient cooperative ? ?Airway & Oxygen Therapy: Patient Spontanous Breathing and Patient connected to nasal cannula oxygen ? ?Post-op Assessment: Report given to RN and Post -op Vital signs reviewed and stable ? ?Post vital signs: Reviewed ? ?Last Vitals:  ?Vitals Value Taken Time  ?BP 91/47 02/16/22 1618  ?Temp    ?Pulse 58 02/16/22 1621  ?Resp 20 02/16/22 1621  ?SpO2 96 % 02/16/22 1621  ?Vitals shown include unvalidated device data. ? ?Last Pain:  ?Vitals:  ? 02/16/22 1330  ?TempSrc:   ?PainSc: 0-No pain  ?   ? ?  ? ?Complications: No notable events documented. ?

## 2022-02-16 NOTE — Interval H&P Note (Signed)
History and Physical Interval Note: ? ?02/16/2022 ?12:46 PM ? ?Debra Diaz  has presented today for surgery, with the diagnosis of aflutter.  The various methods of treatment have been discussed with the patient and family. After consideration of risks, benefits and other options for treatment, the patient has consented to  Procedure(s): ?A-FLUTTER ABLATION (N/A) as a surgical intervention.  The patient's history has been reviewed, patient examined, no change in status, stable for surgery.  I have reviewed the patient's chart and labs.  Questions were answered to the patient's satisfaction.   ? ? ?Traci Plemons T Corin Tilly ? ? ?

## 2022-02-17 ENCOUNTER — Encounter (HOSPITAL_COMMUNITY): Payer: Self-pay | Admitting: Cardiology

## 2022-02-17 ENCOUNTER — Other Ambulatory Visit (HOSPITAL_COMMUNITY): Payer: Self-pay

## 2022-02-17 DIAGNOSIS — I10 Essential (primary) hypertension: Secondary | ICD-10-CM | POA: Diagnosis not present

## 2022-02-17 DIAGNOSIS — E119 Type 2 diabetes mellitus without complications: Secondary | ICD-10-CM | POA: Diagnosis not present

## 2022-02-17 DIAGNOSIS — I484 Atypical atrial flutter: Secondary | ICD-10-CM | POA: Diagnosis not present

## 2022-02-17 DIAGNOSIS — Z20822 Contact with and (suspected) exposure to covid-19: Secondary | ICD-10-CM | POA: Diagnosis not present

## 2022-02-17 LAB — SARS CORONAVIRUS 2 (TAT 6-24 HRS): SARS Coronavirus 2: NEGATIVE

## 2022-02-17 MED ORDER — CHLORHEXIDINE GLUCONATE CLOTH 2 % EX PADS: 6.0000 | MEDICATED_PAD | Freq: Every day | CUTANEOUS | Status: DC

## 2022-02-17 MED ORDER — COLCHICINE 0.6 MG PO TABS
0.6000 mg | ORAL_TABLET | Freq: Once | ORAL | Status: AC
  Administered 2022-02-17: 0.6 mg via ORAL
  Filled 2022-02-17: qty 1

## 2022-02-17 MED ORDER — COLCHICINE 0.6 MG PO TABS
0.6000 mg | ORAL_TABLET | Freq: Two times a day (BID) | ORAL | 0 refills | Status: DC
  Filled 2022-02-17: qty 10, 5d supply, fill #0

## 2022-02-17 NOTE — Anesthesia Postprocedure Evaluation (Signed)
Anesthesia Post Note ? ?Patient: Debra Diaz ? ?Procedure(s) Performed: A-FLUTTER ABLATION ? ?  ? ?Patient location during evaluation: PACU ?Anesthesia Type: General ?Level of consciousness: awake and alert ?Pain management: pain level controlled ?Vital Signs Assessment: post-procedure vital signs reviewed and stable ?Respiratory status: spontaneous breathing, nonlabored ventilation, respiratory function stable and patient connected to nasal cannula oxygen ?Cardiovascular status: blood pressure returned to baseline and stable ?Postop Assessment: no apparent nausea or vomiting ?Anesthetic complications: no ? ? ?No notable events documented. ? ?Last Vitals:  ?Vitals:  ? 02/17/22 1130 02/17/22 1200  ?BP: 106/72 119/86  ?Pulse: (!) 46 (!) 51  ?Resp: (!) 21 (!) 23  ?Temp:    ?SpO2: 96% 96%  ?  ?Last Pain:  ?Vitals:  ? 02/17/22 1130  ?TempSrc:   ?PainSc: 7   ? ? ?  ?  ?  ?  ?  ?  ? ?Suzette Battiest E ? ? ? ? ?

## 2022-02-17 NOTE — Progress Notes (Signed)
Discharged home with spouse after discharge instructions given by interpreter ?

## 2022-02-17 NOTE — Discharge Summary (Signed)
? ? ? ?ELECTROPHYSIOLOGY PROCEDURE DISCHARGE SUMMARY  ? ? ?Patient ID: Debra Diaz,  ?MRN: [id number], DOB/AGE: January 20, 1976 46 y.o. ? ?Admit date: 02/16/2022 ?Discharge date: 02/17/2022 ? ?Primary Care Physician: Carollee Leitz, MD  ?Primary Cardiologist: Dr.  Harrington Challenger ?Electrophysiologist: Dr. Quentin Ore ? ?Primary Discharge Diagnosis:  ?AFlutter ?CHA2DS2Vasc is 2 (3 w/gender), on Eliquis ? 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 ? ?Not on File ? ? ?Procedures This Admission:  ?1. 02/16/22: EPS/ablation ?CONCLUSIONS: ?1. Successful ablation of typical atrial flutter (CTI) ?2. Successful ablation of atypical atrial flutter encircling the inferior vena cava (inferior vena cava to posterior right atrial ablation line) ?3.  Three-dimensional electroanatomic voltage map demonstrating extensive scar in the right atrium along the interatrial septum, lateral right atrial wall and SVC-RA junction near the sinus node. ?4.  Poor sinus node function at lower heart rates at the conclusion of the procedure.  Given extensive scar near the sinus node and evidence of sinus node dysfunction, the patient may require permanent pacing in the future. ?5.  Hold AV nodal blockers ?6. No early apparent complications. ? ? ? ?Brief HPI: ?Debra Diaz is a 46 y.o. female w/PMHx including above referred to Dr. Quentin Ore for evaluation/consideration of ablation for her Aflutter.  Risk, benefits, and alternatives to EP study and radiofrequency ablation for atrial flutter were also discussed and she wanted to proceed. ? ?Hospital Course:  ?The patient was admitted and underwent EPS/ablation procedure.  Post ablation with conversion she maintained a hemodynamically stable junctional rhythm with intermittent SR and was decided to admit for overnight observation.    She was monitored on telemetry overnight which demonstrated SR 60's-70's, junctional rhythm high  40's-50's with narrow QRS.  Groin site is stable without bleeding, hematoma, tenderness.  Wound care, arm mobility, and restrictions were reviewed with the patient.  The patient feels well, has minimal pleuritic CP not unexpected post ablation will rx colchicine, no SOB.  She ambulated with Dr. Quentin Ore and myself around the unit, she was steady on her feet, felt well, mentioned felt much better then she has in a long time.  She has excellent HR excursion to the 80's-90s, even 110's.  She was examined by Dr. Quentin Ore and considered stable for discharge to home.  ? ?Early follow is in place ? ? ?Physical Exam: ?Vitals:  ? 02/17/22 0730 02/17/22 0800 02/17/22 0820 02/17/22 0830  ?BP: 114/70     ?Pulse: (!) 58 (!) 46  (!) 46  ?Resp: (!) 22 17  (!) 22  ?Temp:   97.7 ?F (36.5 ?C)   ?TempSrc:   Oral   ?SpO2: 96% 97%  96%  ?Weight:      ?Height:      ? ? ?GEN- The patient is well appearing, alert and oriented x 3 today.   ?HEENT: normocephalic, atraumatic; sclera clear, conjunctiva pink; hearing intact; oropharynx clear; neck supple, no JVP ?Lungs- CTA b/l, normal work of breathing.  No wheezes, rales, rhonchi ?Heart- RRR no murmurs, rubs or gallops, PMI not laterally displaced ?GI- soft, non-tender, non-distended ?Extremities- no clubbing, cyanosis, or edema, groin site is stable, no hematoma, bleeding ?MS- no significant deformity or atrophy ?Skin- warm and dry, no rash or lesion ?Psych- euthymic mood, full affect ?Neuro- no gross deficits ? ? ?Labs: ?  ?Lab Results  ?Component Value Date  ? WBC 10.0 01/24/2022  ? HGB 15.6 01/24/2022  ? HCT 47.1 (H) 01/24/2022  ? MCV 86  01/24/2022  ? PLT 319 01/24/2022  ? No results for input(s): NA, K, CL, CO2, BUN, CREATININE, CALCIUM, PROT, BILITOT, ALKPHOS, ALT, AST, GLUCOSE in the last 168 hours. ? ?Invalid input(s): LABALBU ? ?Discharge Medications:  ?Allergies as of 02/17/2022   ?Not on File ?  ? ?  ?Medication List  ?  ? ?STOP taking these medications   ? ?diltiazem 30 MG  tablet ?Commonly known as: Cardizem ?  ? ?  ? ?TAKE these medications   ? ?Accu-Chek Guide test strip ?Generic drug: glucose blood ?Use as instructed ?  ?apixaban 5 MG Tabs tablet ?Commonly known as: ELIQUIS ?Take 1 tablet (5 mg total) by mouth 2 (two) times daily. ?  ?blood glucose meter kit and supplies Kit ?Dispense based on patient and insurance preference. Use up to four times daily as directed. (FOR ICD-9 250.00, 250.01). ?  ?Blood Pressure Kit ?Check blood pressure twice a day.  Dx code: I10 ?  ?colchicine 0.6 MG tablet ?Take 1 tablet (0.6 mg total) by mouth 2 (two) times daily for 5 days. ?  ?ibuprofen 200 MG tablet ?Commonly known as: ADVIL ?Take 600 mg by mouth every 6 (six) hours as needed for moderate pain or headache. ?  ?linaclotide 145 MCG Caps capsule ?Commonly known as: Linzess ?Take 1 capsule (145 mcg total) by mouth daily before breakfast. ?  ?losartan-hydrochlorothiazide 50-12.5 MG tablet ?Commonly known as: Hyzaar ?Take 1 tablet by mouth daily. ?  ?metFORMIN 500 MG 24 hr tablet ?Commonly known as: GLUCOPHAGE-XR ?Take 2 tablets (1,000 mg total) by mouth in the morning and at bedtime. ?  ?polyethylene glycol powder 17 GM/SCOOP powder ?Commonly known as: GLYCOLAX/MIRALAX ?Start with one scoop daily, increase to two scoops daily as needed for soft bowel movement each day. ?  ?potassium chloride 10 MEQ tablet ?Commonly known as: KLOR-CON ?TAKE 1 TABLET BY MOUTH ONCE WEEKLY SAME DAY AS DAY AS FUROSEMIDE ?  ?Vitamin D (Cholecalciferol) 25 MCG (1000 UT) Tabs ?Take 1,000 Units by mouth daily. ?  ? ?  ? ? ?Disposition: Home ?Discharge Instructions   ? ? Diet - low sodium heart healthy   Complete by: As directed ?  ? Increase activity slowly   Complete by: As directed ?  ? ?  ? ? Follow-up Information   ? ? [facility]   ?Specialty: Cardiology ?Why: 03/03/22 @ 10:00AM, with C. Fenton, PA-C ?Contact information: ?[address] ?[id number] mc ?[place] ?[phone] ? ?  ?  ? ?  ?  ? ?  ? ? ?Duration of Discharge Encounter: Greater than 30 minutes including physician time. ? ?Signed, ?Tommye Standard, PA-C ?02/17/2022 ?9:38 AM ? ? ? ?

## 2022-02-23 ENCOUNTER — Other Ambulatory Visit: Payer: Self-pay | Admitting: Cardiology

## 2022-02-23 DIAGNOSIS — I4892 Unspecified atrial flutter: Secondary | ICD-10-CM

## 2022-02-23 DIAGNOSIS — I483 Typical atrial flutter: Secondary | ICD-10-CM

## 2022-02-23 NOTE — Telephone Encounter (Signed)
Eliquis '5mg'$  refill request received. Patient is 46 years old, weight-94.8kg, Crea-0.65 on 01/24/2022, Diagnosis-Aflutter, and last seen by Dr. Quentin Ore on 01/24/2022. Dose is appropriate based on dosing criteria. Will send in refill to requested pharmacy.   ?

## 2022-03-03 ENCOUNTER — Other Ambulatory Visit: Payer: Self-pay

## 2022-03-03 ENCOUNTER — Encounter (HOSPITAL_COMMUNITY): Payer: Self-pay | Admitting: Physician Assistant

## 2022-03-03 ENCOUNTER — Ambulatory Visit (HOSPITAL_COMMUNITY)
Admit: 2022-03-03 | Discharge: 2022-03-03 | Disposition: A | Discharge status: Home / Self Care | Payer: Medicaid Other | Attending: Physician Assistant | Admitting: Physician Assistant

## 2022-03-03 VITALS — BP 132/90 | HR 44 | Ht 62.0 in | Wt 215.4 lb

## 2022-03-03 DIAGNOSIS — I484 Atypical atrial flutter: Secondary | ICD-10-CM

## 2022-03-03 DIAGNOSIS — I483 Typical atrial flutter: Secondary | ICD-10-CM | POA: Diagnosis not present

## 2022-03-03 DIAGNOSIS — D219 Benign neoplasm of connective and other soft tissue, unspecified: Secondary | ICD-10-CM | POA: Diagnosis not present

## 2022-03-03 DIAGNOSIS — I495 Sick sinus syndrome: Secondary | ICD-10-CM | POA: Insufficient documentation

## 2022-03-03 DIAGNOSIS — Z7901 Long term (current) use of anticoagulants: Secondary | ICD-10-CM | POA: Insufficient documentation

## 2022-03-03 DIAGNOSIS — I10 Essential (primary) hypertension: Secondary | ICD-10-CM | POA: Diagnosis not present

## 2022-03-03 DIAGNOSIS — Z6839 Body mass index (BMI) 39.0-39.9, adult: Secondary | ICD-10-CM | POA: Diagnosis not present

## 2022-03-03 DIAGNOSIS — E669 Obesity, unspecified: Secondary | ICD-10-CM | POA: Insufficient documentation

## 2022-03-03 DIAGNOSIS — D6869 Other thrombophilia: Secondary | ICD-10-CM | POA: Insufficient documentation

## 2022-03-03 DIAGNOSIS — E119 Type 2 diabetes mellitus without complications: Secondary | ICD-10-CM | POA: Diagnosis not present

## 2022-03-03 NOTE — Progress Notes (Signed)
? ? ?Primary Care Physician: Walsh, Tanya, MD ?Primary Cardiologist: Dr Ross ?Primary Electrophysiologist: Dr Lambert ?Referring Physician: Dr Hochrein  ? ? ?Debra Diaz is a 45 y.o. female with a history of DM, HTN, LA myxoma s/p resection 2016, atrial flutter who presents for follow up in the [facility].  The patient was initially diagnosed with atrial flutter 12/31/21 after presenting to the ED with symptoms of palpitations, SOB, and dizziness. ECG at the ED showed atrial flutter. Her blood glucose levels were also elevated. Patient was started on Eliquis for a CHADS2VASC score of 3.  ? ?On follow up today, patient is s/p typical and atypical flutter ablation with Dr Lambert on 02/16/22. An interpreter was used today. Patient reports that she feels much better post ablation. At times, she feels her heart try to race but it stops quickly. She denies CP, swallowing pain, or groin issues.  ? ?Today, she denies symptoms of chest pain, orthopnea, PND, lower extremity edema, dizziness, presyncope, syncope, snoring, daytime somnolence, bleeding, or neurologic sequela. The patient is tolerating medications without difficulties and is otherwise without complaint today.  ? ? ?Atrial Fibrillation Risk Factors: ? ?she does not have symptoms or diagnosis of sleep apnea. ?Negative sleep study 2021 ?she does not have a history of rheumatic fever. ?she does have a history of alcohol use. ? ? ?she has a BMI of Body mass index is 39.4 kg/m?.. ?Filed Weights  ? 03/03/22 0957  ?Weight: 97.7 kg  ? ? ? ?Family History  ?Problem Relation Age of Onset  ? Hypertension Mother   ? Heart disease Mother   ? Diabetes Father   ? Breast cancer Maternal Grandmother   ? Stomach cancer Maternal Grandfather   ? Asthma Son   ? Colon cancer Neg Hx   ? Rectal cancer Neg Hx   ? ? ? ?Atrial Fibrillation Management history: ? ?Previous antiarrhythmic drugs: none ?Previous cardioversions: none ?Previous ablations:  02/16/22 ?CHADS2VASC score: 3 ?Anticoagulation history: Eliquis ? ? ?Past Medical History:  ?Diagnosis Date  ? Anemia   ? Blood transfusion without reported diagnosis   ? Diabetes mellitus without complication (HCC)   ? Hyperlipidemia   ? Hypertension   ? Hypokalemia   ? Morbid obesity (HCC)   ? 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 ?Past Surgical History:  ?Procedure Laterality Date  ? A-FLUTTER ABLATION N/A 02/16/2022  ? Procedure: A-FLUTTER ABLATION;  Surgeon: Lambert, Cameron T, MD;  Location: [facility] INVASIVE CV LAB;  Service: Cardiovascular;  Laterality: N/A;  ? CESAREAN SECTION  1999 and 2011  ? x 2  ? LEFT HEART CATHETERIZATION WITH CORONARY ANGIOGRAM N/A 03/02/2015  ? Procedure: LEFT HEART CATHETERIZATION WITH CORONARY ANGIOGRAM;  Surgeon: David W Harding, MD;  Location: [facility] CATH LAB;  Service: Cardiovascular;  Laterality: N/A;  ? MINIMALLY INVASIVE EXCISION OF ATRIAL MYXOMA N/A 03/04/2015  ? Procedure: MINIMALLY INVASIVE RESECTION OF LEFT ATRIAL MYXOMA ( USING A BILAYER PATCH CLOSURE);  Surgeon: Clarence H Owen, MD;  Location: [facility] OR;  Service: Open Heart Surgery;  Laterality: N/A;  ? myxoma N/A   ? chest  ? TEE WITHOUT CARDIOVERSION N/A 03/01/2015  ? Procedure: TRANSESOPHAGEAL ECHOCARDIOGRAM (TEE);  Surgeon: Brian S Crenshaw, MD;  Location: [facility] ENDOSCOPY;  Service: Cardiovascular;  Laterality: N/A;  ? TEE WITHOUT CARDIOVERSION N/A 03/04/2015  ? Procedure: TRANSESOPHAGEAL ECHOCARDIOGRAM (TEE);  Surgeon: Clarence H Owen, MD;  Location: [facility] OR;  Service: Open Heart Surgery;  Laterality: N/A;  ?   TUBAL LIGATION  2011  ? ? ?Current Outpatient Medications  ?Medication Sig Dispense Refill  ? apixaban (ELIQUIS) 5 MG TABS tablet TAKE 1 TABLET(5 MG) BY MOUTH TWICE DAILY 60 tablet 10  ? blood glucose meter kit and supplies KIT Dispense based on patient and insurance preference. Use up to four times daily as directed. (FOR ICD-9 250.00, 250.01). 1 each 0  ? Blood Pressure KIT Check blood pressure twice a  day.  Dx code: I10 1 kit 0  ? glucose blood (ACCU-CHEK GUIDE) test strip Use as instructed 350 strip 1  ? linaclotide (LINZESS) 145 MCG CAPS capsule Take 1 capsule (145 mcg total) by mouth daily before breakfast. 30 capsule 11  ? losartan-hydrochlorothiazide (HYZAAR) 50-12.5 MG tablet Take 1 tablet by mouth daily. 90 tablet 1  ? metFORMIN (GLUCOPHAGE-XR) 500 MG 24 hr tablet Take 2 tablets (1,000 mg total) by mouth in the morning and at bedtime. 360 tablet 3  ? colchicine 0.6 MG tablet Take 1 tablet (0.6 mg total) by mouth 2 (two) times daily for 5 days. 10 tablet 0  ? ?No current facility-administered medications for this encounter.  ? ? ?No Known Allergies ? ?Social History  ? ?Socioeconomic History  ? Marital status: Married  ?  Spouse name: Not on file  ? Number of children: Not on file  ? Years of education: Not on file  ? Highest education level: Not on file  ?Occupational History  ? Not on file  ?Tobacco Use  ? Smoking status: Former  ?  Types: Cigarettes  ?  Quit date: 11/17/2017  ?  Years since quitting: 4.2  ? Smokeless tobacco: Never  ? Tobacco comments:  ?  Former smoker 01/09/22  ?Vaping Use  ? Vaping Use: Never used  ?Substance and Sexual Activity  ? Alcohol use: Yes  ?  Comment: occasional  ? Drug use: No  ? Sexual activity: Yes  ?  Birth control/protection: Surgical  ?Other Topics Concern  ? Not on file  ?Social History Narrative  ? Not on file  ? ?Social Determinants of Health  ? ?Financial Resource Strain: Not on file  ?Food Insecurity: Not on file  ?Transportation Needs: Not on file  ?Physical Activity: Not on file  ?Stress: Not on file  ?Social Connections: Not on file  ?Intimate Partner Violence: Not on file  ? ? ? ?ROS- All systems are reviewed and negative except as per the HPI above. ? ?Physical Exam: ?Vitals:  ? 03/03/22 0957  ?BP: 132/90  ?Pulse: (!) 44  ?Weight: 97.7 kg  ?Height: 5' 2" (1.575 m)  ? ? ?GEN- The patient is a well appearing obese female, alert and oriented x 3 today.    ?HEENT-head normocephalic, atraumatic, sclera clear, conjunctiva pink, hearing intact, trachea midline. ?Lungs- Clear to ausculation bilaterally, normal work of breathing ?Heart- Regular rate and rhythm, bradycardia, no murmurs, rubs or gallops  ?GI- soft, NT, ND, + BS ?Extremities- no clubbing, cyanosis, or edema ?MS- no significant deformity or atrophy ?Skin- no rash or lesion ?Psych- euthymic mood, full affect ?Neuro- strength and sensation are intact ? ? ?Wt Readings from Last 3 Encounters:  ?03/03/22 97.7 kg  ?02/16/22 94.8 kg  ?01/24/22 94.8 kg  ? ? ?EKG today demonstrates  ?SB ?Vent. rate 44 BPM ?PR interval 164 ms ?QRS duration 84 ms ?QT/QTcB 466/398 ms ? ?Echo 08/11/21 demonstrated  ? 1. Left ventricular ejection fraction, by estimation, is 60 to 65%. Left  ?ventricular ejection fraction by 3D volume is   63 %. The left ventricle has normal function. The left ventricle has no regional wall motion  ?abnormalities. Left ventricular diastolic  parameters were normal.  ? 2. Right ventricular systolic function is normal. The right ventricular  ?size is normal. There is normal pulmonary artery systolic pressure.  ? 3. Left atrial size was moderately dilated.  ? 4. Right atrial size was mildly dilated.  ? 5. The mitral valve is normal in structure. No evidence of mitral valve regurgitation.  ? 6. The aortic valve is normal in structure. Aortic valve regurgitation is not visualized. No aortic stenosis is present.  ? ?Epic records are reviewed at length today ? ?CHA2DS2-VASc Score = 3  ?The patient's score is based upon: ?CHF History: 0 ?HTN History: 1 ?Diabetes History: 1 ?Stroke History: 0 ?Vascular Disease History: 0 ?Age Score: 0 ?Gender Score: 1 ?    ? ? ?ASSESSMENT AND PLAN: ?1. Atypical/Typical atrial flutter ?The patient's CHA2DS2-VASc score is 3, indicating a 3.2% annual risk of stroke.   ?S/p ablation 02/16/22 ?Patient in SB today. ?Continue Eliquis 5 mg BID ?Avoiding AV nodal agents given sinus node  dysfunction.  ? ?2. Secondary Hypercoagulable State (ICD10:  D68.69) ?The patient is at significant risk for stroke/thromboembolism based upon her CHA2DS2-VASc Score of 3.  Continue Apixaban (Eliquis).  ? ?3. Obesity

## 2022-03-14 ENCOUNTER — Other Ambulatory Visit: Payer: Self-pay | Admitting: Family Medicine

## 2022-03-14 ENCOUNTER — Other Ambulatory Visit: Payer: Self-pay | Admitting: Physician Assistant

## 2022-03-14 ENCOUNTER — Ambulatory Visit (HOSPITAL_COMMUNITY): Payer: Medicaid Other

## 2022-03-24 ENCOUNTER — Ambulatory Visit (INDEPENDENT_AMBULATORY_CARE_PROVIDER_SITE_OTHER): Payer: Medicaid Other | Admitting: Cardiology

## 2022-03-24 ENCOUNTER — Encounter: Payer: Self-pay | Admitting: Cardiology

## 2022-03-24 VITALS — BP 120/70 | HR 51 | Ht 62.0 in | Wt 217.2 lb

## 2022-03-24 DIAGNOSIS — I10 Essential (primary) hypertension: Secondary | ICD-10-CM | POA: Diagnosis not present

## 2022-03-24 DIAGNOSIS — I484 Atypical atrial flutter: Secondary | ICD-10-CM

## 2022-03-24 NOTE — Patient Instructions (Signed)

## 2022-03-24 NOTE — Progress Notes (Signed)
?Electrophysiology Office Follow up Visit Note:   ? ?Date:  03/24/2022  ? ?ID:  Debra Diaz, DOB [birth year], MRN [id number] ? ?PCP:  Carollee Leitz, MD  ?[facility] Cardiologist:  Dorris Carnes, MD  ?[facility] Electrophysiologist:  Vickie Epley, MD  ? ? ?Interval History:   ? ?Debra Diaz is a 46 y.o. female who presents for a follow up visit. They were last seen in clinic 01/24/2022. ?Since their last appointment, they underwent atrial flutter ablation 02/16/2022. They followed up with Adline Peals, PA on 03/03/2022 where she was feeling much better. She felt her heart racing at times but this resolved quickly. ? ?Virtual interpretation services were used for this visit. She is also accompanied by her daughter. Overall, she is feeling better and denies any issues with recurrent arrhythmia. She is now able to go on walks without becoming short of breath. ? ?Lately she has been suffering from constant abdominal bloating, which is mostly constant. After waking up she notices that she is bloated and has a greenish lesion on the skin of her right abdomen. To treat her bloating she has tried to drink chamomile or to go exercise with no improvement. She has restarted her exercise routines. ? ?Regarding her diet, she has been trying to eat smaller portions  At home her blood sugars have averaged 120-125. ? ?Recently she is taking Linzess every other day. ? ?She denies any palpitations, chest pain, or peripheral edema. No lightheadedness, headaches, syncope, orthopnea, or PND. ? ?Interview conducted with interpreter today. ? ?  ? ?Past Medical History:  ?Diagnosis Date  ? Anemia   ? Blood transfusion without reported diagnosis   ? Diabetes mellitus without complication ([place])   ? Hyperlipidemia   ? Hypertension   ? Hypokalemia   ? Morbid obesity ([place])   ? 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 ? ?Past Surgical History:  ?Procedure Laterality Date  ? A-FLUTTER  ABLATION N/A 02/16/2022  ? Procedure: A-FLUTTER ABLATION;  Surgeon: Vickie Epley, MD;  Location: [place] CV LAB;  Service: Cardiovascular;  Laterality: N/A;  ? [place] and 2011  ? x 2  ? LEFT HEART CATHETERIZATION WITH CORONARY ANGIOGRAM N/A 03/02/2015  ? Procedure: LEFT HEART CATHETERIZATION WITH CORONARY ANGIOGRAM;  Surgeon: [name], MD;  Location: [facility] CATH LAB;  Service: Cardiovascular;  Laterality: N/A;  ? MINIMALLY INVASIVE EXCISION OF ATRIAL MYXOMA N/A 03/04/2015  ? Procedure: MINIMALLY INVASIVE RESECTION OF LEFT ATRIAL MYXOMA ( USING A BILAYER PATCH CLOSURE);  Surgeon: [name], MD;  Location: [place];  Service: Open Heart Surgery;  Laterality: N/A;  ? myxoma N/A   ? chest  ? TEE WITHOUT CARDIOVERSION N/A 03/01/2015  ? Procedure: TRANSESOPHAGEAL ECHOCARDIOGRAM (TEE);  Surgeon: [name], MD;  Location: [place];  Service: Cardiovascular;  Laterality: N/A;  ? TEE WITHOUT CARDIOVERSION N/A 03/04/2015  ? Procedure: TRANSESOPHAGEAL ECHOCARDIOGRAM (TEE);  Surgeon: [name], MD;  Location: [place];  Service: Open Heart Surgery;  Laterality: N/A;  ? TUBAL LIGATION  2011  ? ? ?Current Medications: ?Current Meds  ?Medication Sig  ? apixaban (ELIQUIS) 5 MG TABS tablet TAKE 1 TABLET(5 MG) BY MOUTH TWICE DAILY  ? blood glucose meter kit and supplies KIT Dispense based on patient and insurance preference. Use up to four times daily as directed. (FOR ICD-9 250.00, 250.01).  ? Blood Pressure KIT Check blood pressure twice a day.  Dx code: I69  ?  glucose blood (ACCU-CHEK GUIDE) test strip Use as instructed  ? linaclotide (LINZESS) 145 MCG CAPS capsule Take 1 capsule (145 mcg total) by mouth daily before breakfast.  ? losartan-hydrochlorothiazide (HYZAAR) 50-12.5 MG tablet Take 1 tablet by mouth daily.  ? metFORMIN (GLUCOPHAGE-XR) 500 MG 24 hr tablet Take 2 tablets (1,000 mg total) by mouth in the morning and at bedtime.  ?  ? ?Allergies:   Patient has no known allergies.   ? ?Social History  ? ?Socioeconomic History  ? Marital status: Married  ?  Spouse name: Not on file  ? Number of children: Not on file  ? Years of education: Not on file  ? Highest education level: Not on file  ?Occupational History  ? Not on file  ?Tobacco Use  ? Smoking status: Former  ?  Types: Cigarettes  ?  Quit date: 11/17/2017  ?  Years since quitting: 4.3  ? Smokeless tobacco: Never  ? Tobacco comments:  ?  Former smoker 01/09/22  ?Vaping Use  ? Vaping Use: Never used  ?Substance and Sexual Activity  ? Alcohol use: Yes  ?  Comment: occasional  ? Drug use: No  ? Sexual activity: Yes  ?  Birth control/protection: Surgical  ?Other Topics Concern  ? Not on file  ?Social History Narrative  ? Not on file  ? ?Social Determinants of Health  ? ?Financial Resource Strain: Not on file  ?Food Insecurity: Not on file  ?Transportation Needs: Not on file  ?Physical Activity: Not on file  ?Stress: Not on file  ?Social Connections: Not on file  ?  ? ?Family History: ?The patient's family history includes Asthma in her son; Breast cancer in her maternal grandmother; Diabetes in her father; Heart disease in her mother; Hypertension in her mother; Stomach cancer in her maternal grandfather. There is no history of Colon cancer or Rectal cancer. ? ?ROS:   ?Please see the history of present illness.    ?(+) Abdominal bloating ?All other systems reviewed and are negative. ? ?EKGs/Labs/Other Studies Reviewed:   ? ?The following studies were reviewed today: ? ?A-Flutter Ablation 02/16/2022: ?CONCLUSIONS: ?1. Successful ablation of typical atrial flutter (CTI) ?2. Successful ablation of atypical atrial flutter encircling the inferior vena cava (inferior vena cava to posterior right atrial ablation line) ?3.  Three-dimensional electroanatomic voltage map demonstrating extensive scar in the right atrium along the interatrial septum, lateral right atrial wall and SVC-RA junction near the sinus node. ?4.  Poor sinus node function at lower  heart rates at the conclusion of the procedure.  Given extensive scar near the sinus node and evidence of sinus node dysfunction, the patient may require permanent pacing in the future. ?5.  Hold AV nodal blockers ?6. No early apparent complications. ? ?Cardiac CTA 02/14/2022: ?FINDINGS: ?Image quality: Excellent. ?  ?Noise artifact is: Limited. ?  ?Pulmonary Veins: There is normal pulmonary vein drainage into the ?left atrium (2 on the right and 2 on the left) with ostial ?measurements as follows: ?  ?RUPV: Ostium 18.71m x 14.9 mm  area 1.83 cm 2 ?  ?RLPV:  Ostium 22.1 mm x 16.0 mm  area 2.55 cm2 ?  ?LUPV:  Ostium 20.6 mm x 15.0 mm area 2.38 cm2 ?  ?LLPV:  Ostium 20.5 mm x 15.0 mm  area 2.26 cm2 ?  ?Left Atrium: The left atrial size is normal. There is no PFO/ASD. ?The left atrial appendage is large broccoli type. There is no ?thrombus in the left atrial appendage on  contrast or delayed ?imaging. The esophagus runs in the left atrial midline and is not in ?proximity to any of the pulmonary vein ostia. ?  ?Coronary Arteries: CAC score of 155, which is 99 percentile for ?age-, race-, and sex-matched controls. Normal coronary origin. Right ?dominance. The study was performed without use of NTG and is ?insufficient for plaque evaluation. ?  ?Right Atrium: Right atrial size is within normal limits. ?  ?Right Ventricle: The right ventricular cavity is within normal ?limits. ?  ?Left Ventricle: The ventricular cavity size is within normal limits. ?There are no stigmata of prior infarction. There is no abnormal ?filling defect. ?  ?Pericardium: Normal thickness with no significant effusion or ?calcium present. ?  ?Pulmonary Artery: Normal caliber without proximal filling defect. ?  ?Cardiac valves: The aortic valve is trileaflet without significant ?calcification. The mitral valve is normal structure without ?significant calcification. ?  ?Aorta: Normal caliber with no significant disease. ?  ?Extra-cardiac findings: See  attached radiology report for ?non-cardiac structures. ?  ?IMPRESSION: ?1. There is normal pulmonary vein drainage into the left atrium with ?ostial measurements above. ?  ?2. There is no thrombus in the left atrial

## 2022-04-07 ENCOUNTER — Ambulatory Visit (INDEPENDENT_AMBULATORY_CARE_PROVIDER_SITE_OTHER): Payer: Medicaid Other | Admitting: Podiatry

## 2022-04-07 DIAGNOSIS — M722 Plantar fascial fibromatosis: Secondary | ICD-10-CM

## 2022-04-07 DIAGNOSIS — M21869 Other specified acquired deformities of unspecified lower leg: Secondary | ICD-10-CM | POA: Diagnosis not present

## 2022-04-13 NOTE — Progress Notes (Signed)
?Subjective:  ?Patient ID: Debra Diaz, female    DOB: 26-Nov-1976,  MRN: [id number] ? ?Chief Complaint  ?Patient presents with  ? Plantar Fasciitis  ? ? ?46 y.o. female presents with the above complaint.  Patient presents for follow-up of bilateral Planter fasciitis she states that the pain has started to come.  She is a diabetic with last A1c of 8.9.  She said the injection helped considerably.  She denies any other acute complaints. ? ?Review of Systems: Negative except as noted in the HPI. Denies N/V/F/Ch. ? ?Past Medical History:  ?Diagnosis Date  ? Anemia   ? Blood transfusion without reported diagnosis   ? Diabetes mellitus without complication ([place])   ? Hyperlipidemia   ? Hypertension   ? Hypokalemia   ? Morbid obesity ([place])   ? 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 ? ?Current Outpatient Medications:  ?  apixaban (ELIQUIS) 5 MG TABS tablet, TAKE 1 TABLET(5 MG) BY MOUTH TWICE DAILY, Disp: 60 tablet, Rfl: 10 ?  blood glucose meter kit and supplies KIT, Dispense based on patient and insurance preference. Use up to four times daily as directed. (FOR ICD-9 250.00, 250.01)., Disp: 1 each, Rfl: 0 ?  Blood Pressure KIT, Check blood pressure twice a day.  Dx code: I42, Disp: 1 kit, Rfl: 0 ?  glucose blood (ACCU-CHEK GUIDE) test strip, Use as instructed, Disp: 350 strip, Rfl: 1 ?  linaclotide (LINZESS) 145 MCG CAPS capsule, Take 1 capsule (145 mcg total) by mouth daily before breakfast., Disp: 30 capsule, Rfl: 11 ?  losartan-hydrochlorothiazide (HYZAAR) 50-12.5 MG tablet, Take 1 tablet by mouth daily., Disp: 90 tablet, Rfl: 1 ?  metFORMIN (GLUCOPHAGE-XR) 500 MG 24 hr tablet, Take 2 tablets (1,000 mg total) by mouth in the morning and at bedtime., Disp: 360 tablet, Rfl: 3 ? ?Social History  ? ?Tobacco Use  ?Smoking Status Former  ? Types: Cigarettes  ? Quit date: 11/17/2017  ? Years since quitting: 4.4  ?Smokeless Tobacco Never  ?Tobacco Comments  ? Former smoker 01/09/22   ? ? ?No Known Allergies ?Objective:  ?There were no vitals filed for this visit. ?There is no height or weight on file to calculate BMI. ?Constitutional Well developed. ?Well nourished.  ?Vascular Dorsalis pedis pulses palpable bilaterally. ?Posterior tibial pulses palpable bilaterally. ?Capillary refill normal to all digits.  ?No cyanosis or clubbing noted. ?Pedal hair growth normal.  ?Neurologic Normal speech. ?Oriented to person, place, and time. ?Epicritic sensation to light touch grossly present bilaterally.  ?Dermatologic Nails well groomed and normal in appearance. ?No open wounds. ?No skin lesions.  ?Orthopedic: Normal joint ROM without pain or crepitus bilaterally. ?No visible deformities. ?Tender to palpation at the calcaneal tuber bilaterally. ?No pain with calcaneal squeeze bilaterally. ?Ankle ROM diminished range of motion bilaterally. ?Silf[place]old Test: positive bilaterally.  ? ?Radiographs: Taken and reviewed.  None ? ?Assessment:  ? ?1. Plantar fasciitis of right foot   ?2. Plantar fasciitis of left foot   ?3. Gastrocnemius equinus, unspecified laterality   ? ? ?Plan:  ?Patient was evaluated and treated and all questions answered. ? ?Plantar Fasciitis, bilaterally with underlying gastrocnemius equinus bilateral ?- XR reviewed as above.  ?- Educated on icing and stretching. Instructions given.  ?-Second injection delivered to the plantar fascia as below. ?- DME: None ?- Pharmacologic management: None ?-Also discussed with her shoe gear modification as well. ? ?Procedure: Injection Tendon/Ligament ?Location: Bilateral plantar fascia at the glabrous junction; medial approach. ?Skin Prep: alcohol ?  Injectate: 0.5 cc 0.5% marcaine plain, 0.5 cc of 1% Lidocaine, 0.5 cc kenalog 10. ?Disposition: Patient tolerated procedure well. Injection site dressed with a band-aid. ? ?No follow-ups on file. ?

## 2022-04-20 ENCOUNTER — Encounter: Payer: Self-pay | Admitting: Family Medicine

## 2022-04-20 ENCOUNTER — Ambulatory Visit (INDEPENDENT_AMBULATORY_CARE_PROVIDER_SITE_OTHER): Payer: Medicaid Other | Admitting: Family Medicine

## 2022-04-20 VITALS — BP 122/73 | HR 49 | Ht 62.0 in | Wt 214.4 lb

## 2022-04-20 DIAGNOSIS — R42 Dizziness and giddiness: Secondary | ICD-10-CM | POA: Diagnosis not present

## 2022-04-20 DIAGNOSIS — R001 Bradycardia, unspecified: Secondary | ICD-10-CM | POA: Diagnosis not present

## 2022-04-20 DIAGNOSIS — I10 Essential (primary) hypertension: Secondary | ICD-10-CM

## 2022-04-20 DIAGNOSIS — E1159 Type 2 diabetes mellitus with other circulatory complications: Secondary | ICD-10-CM | POA: Diagnosis present

## 2022-04-20 LAB — POCT GLYCOSYLATED HEMOGLOBIN (HGB A1C): HbA1c, POC (controlled diabetic range): 7.3 % — AB (ref 0.0–7.0)

## 2022-04-20 MED ORDER — LOSARTAN POTASSIUM-HCTZ 100-25 MG PO TABS: 1.0000 | ORAL_TABLET | Freq: Every day | ORAL | 1 refills | Status: DC

## 2022-04-20 MED ORDER — METFORMIN HCL ER 500 MG PO TB24: 1000.0000 mg | ORAL_TABLET | Freq: Two times a day (BID) | ORAL | 3 refills | Status: DC

## 2022-04-20 NOTE — Patient Instructions (Addendum)
Thank you for coming to see me today. It was a pleasure. Today we talked about:  ? ?Increase Hyzaar to 100-25 mg daily ? ?Schedule a lab appointment in 1 week ? ?Recommend compression stockings  ? ?Please follow-up with PCP in 2 weeks ? ?If you have any questions or concerns, please do not hesitate to call the office at [phone]. ? ?Best,  ? ?Carollee Leitz, MD   ?

## 2022-04-20 NOTE — Progress Notes (Signed)
? ? ?  SUBJECTIVE:  ? ?CHIEF COMPLAINT / HPI: diabetic check ? ?DM Type 2 ?Asymptomatic.  Compliant with medication.  Denies any chest pain, polyuria, nausea or vomiting.  No hypoglycemic events ? ?Dizziness ?Occurs from lying to standing.  No dizziness with head movement.  Lasts for few seconds. Recently had cardiac ablation for atypical A Flutter.   Follow up visit with Cardiology was without concern.  ? ?HTN ?Compliant with medication.  BP at home 140's. ? ?PERTINENT  PMH / PSH:  ?HTN ?DM Type 2 ?Atypical Aflutter s/p cardiac ablation ? ?OBJECTIVE:  ? ?BP 122/73   Pulse (!) 49   Ht '5\' 2"'$  (1.575 m)   Wt 214 lb 6 oz (97.2 kg)   SpO2 97%   BMI 39.21 kg/m?   ? ?Orthostatic vitals: ?Lying: BP 137/80 HR 50 ?Sitting: BP 147/83 HR 50 ?Standing: BP 156/90 HR 54 ? ?General: Alert, no acute distress ?Cardio: Normal S1 and S2, SB, no r/m/g,  ?Pulm: CTAB, normal work of breathing ?Abdomen: Bowel sounds normal. Abdomen soft and non-tender.  ?Extremities: No peripheral edema.  ? ? ?ASSESSMENT/PLAN:  ? ?DM2 (diabetes mellitus, type 2) ([place]) ?A1c today 7.3 ?Refill Metformin 1000 mg BID ?Follow up in 6 months ? ?HTN (hypertension) ?Elevated today ?Increase Hyzaar 100-25 mg daily ?BMet today ?Repeat BMet and BP in 1 week ?Follow up in 2 weeks with PCP ?Strict return precautions provided ? ?Dizziness ?Likely bradycardia contributing to dizziness.  Orthostatics wnl. Does not appear to be consistent with BPPV as not symptomatic with movement of head or turing position.   ?Continue to monitor ?Consider Compression stockings although not hypotensive ?Check electrolytes today ?  ? ? ?Carollee Leitz, MD ?[place]  ?

## 2022-04-21 ENCOUNTER — Encounter: Payer: Self-pay | Admitting: Family Medicine

## 2022-04-21 LAB — BASIC METABOLIC PANEL
BUN/Creatinine Ratio: 16 (ref 9–23)
BUN: 9 mg/dL (ref 6–24)
CO2: 25 mmol/L (ref 20–29)
Calcium: 9.4 mg/dL (ref 8.7–10.2)
Chloride: 100 mmol/L (ref 96–106)
Creatinine, Ser: 0.57 mg/dL (ref 0.57–1.00)
Glucose: 183 mg/dL — ABNORMAL HIGH (ref 70–99)
Potassium: 4.2 mmol/L (ref 3.5–5.2)
Sodium: 139 mmol/L (ref 134–144)
eGFR: 113 mL/min/{1.73_m2} (ref >59)

## 2022-04-22 ENCOUNTER — Encounter: Payer: Self-pay | Admitting: Family Medicine

## 2022-04-22 DIAGNOSIS — R42 Dizziness and giddiness: Secondary | ICD-10-CM

## 2022-04-22 HISTORY — DX: Dizziness and giddiness: R42

## 2022-04-22 NOTE — Assessment & Plan Note (Addendum)
Elevated today ?Increase Hyzaar 100-25 mg daily ?BMet today ?Repeat BMet and BP in 1 week ?Follow up in 2 weeks with PCP ?Strict return precautions provided ?

## 2022-04-22 NOTE — Assessment & Plan Note (Signed)
A1c today 7.3 ?Refill Metformin 1000 mg BID ?Follow up in 6 months ?

## 2022-04-22 NOTE — Assessment & Plan Note (Signed)
Likely bradycardia contributing to dizziness.  Orthostatics wnl. Does not appear to be consistent with BPPV as not symptomatic with movement of head or turing position.   ?Continue to monitor ?Consider Compression stockings although not hypotensive ?Check electrolytes today ?

## 2022-04-26 ENCOUNTER — Other Ambulatory Visit: Payer: Medicaid Other

## 2022-04-27 ENCOUNTER — Other Ambulatory Visit: Payer: Medicaid Other

## 2022-04-27 DIAGNOSIS — I10 Essential (primary) hypertension: Secondary | ICD-10-CM

## 2022-04-28 ENCOUNTER — Encounter: Payer: Self-pay | Admitting: Family Medicine

## 2022-04-28 LAB — BASIC METABOLIC PANEL
BUN/Creatinine Ratio: 17 (ref 9–23)
BUN: 11 mg/dL (ref 6–24)
CO2: 24 mmol/L (ref 20–29)
Calcium: 9.7 mg/dL (ref 8.7–10.2)
Chloride: 98 mmol/L (ref 96–106)
Creatinine, Ser: 0.65 mg/dL (ref 0.57–1.00)
Glucose: 135 mg/dL — ABNORMAL HIGH (ref 70–99)
Potassium: 4.3 mmol/L (ref 3.5–5.2)
Sodium: 140 mmol/L (ref 134–144)
eGFR: 109 mL/min/{1.73_m2} (ref >59)

## 2022-05-03 ENCOUNTER — Ambulatory Visit: Payer: Medicaid Other | Admitting: Family Medicine

## 2022-05-03 ENCOUNTER — Encounter: Payer: Self-pay | Admitting: Family Medicine

## 2022-05-03 VITALS — BP 114/84 | Ht 62.0 in | Wt 210.0 lb

## 2022-05-03 DIAGNOSIS — M722 Plantar fascial fibromatosis: Secondary | ICD-10-CM | POA: Diagnosis present

## 2022-05-03 NOTE — Patient Instructions (Signed)
You have plantar fasciitis ?Take tylenol and/or aleve as needed for pain  ?Plantar fascia stretch for 20-30 seconds (do 3 of these) in morning ?Lowering/raise on a step exercises 3 x 10 once or twice a day - this is very important for long term recovery. ?Can add heel walks, toe walks forward and backward as well ?Ice heel for 15 minutes as needed (ok to do with the frozen water bottle). ?Avoid flat shoes/barefoot walking as much as possible. ?Arch straps have been shown to help with pain. ?Wear shoes with the green insoles in them - you can switch these between different shoes. ?[place] sock when sleeping helps keep this stretched out ?Physical therapy is also an option. ?Follow up with me in 1 month to 6 weeks. ?

## 2022-05-03 NOTE — Progress Notes (Signed)
PCP: Carollee Leitz, MD ? ?Subjective:  ? ?HPI: ?Patient is a 46 y.o. female here for bilateral heel pain. ? ?Patient reports bilateral heel pain (R=L) over the past 8 months. There was no preceding trauma or injury. She was diagnosed with plantar fasciitis, for which she was seeing podiatry. She's had 3 injections in each side, most recently April 08, 2022. The injections help for 1-2 months but then her symptoms recur.  ? 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 The pain is worse first thing in the morning and worse at the end of the day. She has been wearing [employer] sneakers with an OTC plantar fascia shoe insert without much improvement. Over the past week she has started rolling her arch on a ball and doing gentle calf stretches. Has not tried any oral medications, creams, strengthening exercises or other remedies. ? ? ?Past Medical History:  ?Diagnosis Date  ? Anemia   ? Blood transfusion without reported diagnosis   ? Diabetes mellitus without complication ([place])   ? Hyperlipidemia   ? Hypertension   ? Hypokalemia   ? Morbid obesity ([place])   ? 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 ? ?Current Outpatient Medications on File Prior to Visit  ?Medication Sig Dispense Refill  ? apixaban (ELIQUIS) 5 MG TABS tablet TAKE 1 TABLET(5 MG) BY MOUTH TWICE DAILY 60 tablet 10  ? blood glucose meter kit and supplies KIT Dispense based on patient and insurance preference. Use up to four times daily as directed. (FOR ICD-9 250.00, 250.01). 1 each 0  ? Blood Pressure KIT Check blood pressure twice a day.  Dx code: I10 1 kit 0  ? glucose blood (ACCU-CHEK GUIDE) test strip Use as instructed 350 strip 1  ? linaclotide (LINZESS) 145 MCG CAPS capsule Take 1 capsule (145 mcg total) by mouth daily before breakfast. 30 capsule 11  ? losartan-hydrochlorothiazide (HYZAAR) 100-25 MG tablet Take 1 tablet by mouth daily. 30 tablet 1  ? metFORMIN (GLUCOPHAGE-XR) 500 MG 24 hr tablet Take 2  tablets (1,000 mg total) by mouth in the morning and at bedtime. 360 tablet 3  ? ?No current facility-administered medications on file prior to visit.  ? ? ?Past Surgical History:  ?Procedure Laterality Date  ? A-FLUTTER ABLATION N/A 02/16/2022  ? Procedure: A-FLUTTER ABLATION;  Surgeon: Vickie Epley, MD;  Location: [place] CV LAB;  Service: Cardiovascular;  Laterality: N/A;  ? [place] and 2011  ? x 2  ? LEFT HEART CATHETERIZATION WITH CORONARY ANGIOGRAM N/A 03/02/2015  ? Procedure: LEFT HEART CATHETERIZATION WITH CORONARY ANGIOGRAM;  Surgeon: [name], MD;  [facility] C[facility];  Service: Cardiovascular;  Laterality: N/A;  ? MINIMALLY INVASIVE EXCISION OF ATRIAL MYXOMA N/A 03/04/2015  ? Procedure: MINIMALLY INVASIVE RESECTION OF LEFT ATRIAL MYXOMA ( USING A BILAYER PATCH CLOSURE);  Surgeon: [name], MD;  Location: [place];  Service: Open Heart Surgery;  Laterality: N/A;  ? myxoma N/A   ? chest  ? TEE WITHOUT CARDIOVERSION N/A 03/01/2015  ? Procedure: TRANSESOPHAGEAL ECHOCARDIOGRAM (TEE);  Surgeon: [name], MD;  Location: [place];  Service: Cardiovascular;  Laterality: N/A;  ? TEE WITHOUT CARDIOVERSION N/A 03/04/2015  ? Procedure: TRANSESOPHAGEAL ECHOCARDIOGRAM (TEE);  Surgeon: [name], MD;  Location: [place];  Service: Open Heart Surgery;  Laterality: N/A;  ? TUBAL LIGATION  2011  ? ? ?No Known Allergies ? ?BP 114/84   Ht  5' 2"  (1.575 m)   Wt 210 lb (95.3 kg)   BMI 38.41 kg/m?  ? ?    ?Objective:  ?Physical Exam: ? ?Gen: NAD, comfortable in exam room ?Feet: inspection reveals no obvious deformity, atrophy, swelling, or skin changes. She has moderate tenderness to palpation over medial process of calcaneal tuberosity bilaterally. No calcaneal fat pat tenderness or achilles tenderness. Normal ROM of bilateral feet and ankles. Pes planus bilaterally. Negative calcaneal squeeze. ?  ?Assessment & Plan:  ?1. Bilateral Plantar Fasciitis- presentation consistent with  plantar fasciitis. Discussed that while injections may improve symptoms temporarily, they do not cure underlying problem. Recommended eccentric calf strengthening exercises, arch binders, orthotics, and [employer]. Continue use of supportive footwear and gentle plantar fascia stretches. Ice and Tylenol prn for pain. Follow up in 6 weeks. ? ?Alcus Dad, MD ?PGY-2 [facility] ? ?

## 2022-05-08 ENCOUNTER — Ambulatory Visit: Payer: Medicaid Other | Admitting: Family Medicine

## 2022-05-08 NOTE — Patient Instructions (Incomplete)
Thank you for coming to see me today. It was a pleasure.   Continue current medication for your blood pressure  You are due for your diabetes check and cholesterol check in June  You are due for an eye exam.  I will refer to the ophthalmologist to have your diabetic eye exam complete.  Please follow-up with PCP in 4 weeks  If you have any questions or concerns, please do not hesitate to call the office at [phone].  Best,   Carollee Leitz, MD

## 2022-05-08 NOTE — Progress Notes (Deleted)
    SUBJECTIVE:   CHIEF COMPLAINT / HPI:   Presents for follow up for elevated blood pressure. Seen in clinic on 04/05 and Hyzaar increased to 100-25 mg.  Follow-up creatinine within normal limits.  Since then patient reports improvement in symptoms **. Associated symptoms include **.   PERTINENT  PMH / PSH:  Diabetes type 2 Hypertension Hyperlipidemia Bradycardia  Atypical a flutter status post ablation  OBJECTIVE:   There were no vitals taken for this visit.   General: Alert, no acute distress Cardio: Normal S1 and S2, RRR, no r/m/g Pulm: CTAB, normal work of breathing Abdomen: Bowel sounds normal. Abdomen soft and non-tender.  Extremities: No peripheral edema.  Neuro: Cranial nerves grossly intact   ASSESSMENT/PLAN:   No problem-specific Assessment & Plan notes found for this encounter.     Carollee Leitz, MD [place]

## 2022-05-12 ENCOUNTER — Ambulatory Visit: Payer: Medicaid Other | Admitting: Podiatry

## 2022-06-01 ENCOUNTER — Ambulatory Visit (INDEPENDENT_AMBULATORY_CARE_PROVIDER_SITE_OTHER): Payer: Medicaid Other | Admitting: Family Medicine

## 2022-06-01 ENCOUNTER — Encounter: Payer: Self-pay | Admitting: Family Medicine

## 2022-06-01 VITALS — BP 104/62 | HR 60 | Wt 214.0 lb

## 2022-06-01 DIAGNOSIS — I10 Essential (primary) hypertension: Secondary | ICD-10-CM

## 2022-06-01 DIAGNOSIS — R109 Unspecified abdominal pain: Secondary | ICD-10-CM | POA: Diagnosis not present

## 2022-06-01 DIAGNOSIS — R140 Abdominal distension (gaseous): Secondary | ICD-10-CM | POA: Diagnosis not present

## 2022-06-01 MED ORDER — PANTOPRAZOLE SODIUM 40 MG PO TBEC: 40.0000 mg | DELAYED_RELEASE_TABLET | Freq: Every day | ORAL | 0 refills | Status: DC

## 2022-06-01 NOTE — Progress Notes (Unsigned)
    SUBJECTIVE:   CHIEF COMPLAINT / HPI: Blood pressure check, abdominal bloating  Presents for follow up for blood pressure check. Seen in clinic on 05/04 and Hyzaar increased to 100-25 mg daily.  Not checking blood pressures at home.  Tolerating blood pressure medication well.  Denies any headaches, visual changes, chest pain, shortness of breath, abdominal pain, nausea, vomiting or lower extremity edema.  Abdominal bloating Patient reports increased abdominal bloating.  Has been ongoing since last November.  Was seen by GI for similar symptoms, recommended colonoscopy, Linzess for chronic constipation, thought to be likely at IBS.  Patient reports she did not follow-up after procedure as recommended.  She denies any fevers, abdominal pain, nausea, vomiting, diarrhea, bloody stool.  Endorses postprandial bloating, belching, and chronic constipation.  Taking Linzess daily without much relief.  Was previously on PPI.  PERTINENT  PMH / PSH:  DM type II Hypertension  OBJECTIVE:   BP 104/62   Pulse 60   Wt 214 lb (97.1 kg)   SpO2 98%   BMI 39.14 kg/m    General: Alert, no acute distress Cardio: Normal S1 and S2, RRR, no r/m/g Pulm: CTAB, normal work of breathing Abdomen: Bowel sounds normal. Abdomen soft and non-tender.  Negative Murphy's sign, negative rebound tenderness, negative cough sign. Extremities: No peripheral edema.   ASSESSMENT/PLAN:   HTN (hypertension) At goal with increase in antihypertensive.  Asymptomatic -Continue Hyzaar 100-25 mg nightly -Follow-up with PCP as needed -Strict return precautions provided.  Abdominal discomfort Continues to have abdominal bloating.  Has previously seen GI and will need to follow-up with them.  Suspect IBS -constipation, possible gastroparesis.  Low suspicion for acute abdomen given no peritoneal symptoms on exam today. -GI appointment scheduled for patient -Continue MiraLAX daily -Continue Linzess daily -Start Protonix 40 mg  daily -Nutrition consult for weight loss management, IBS and diabetic nutrition. -Follow-up with PCP in 2 weeks      Carollee Leitz, MD [place]

## 2022-06-01 NOTE — Patient Instructions (Addendum)
Thank you for coming to see me today. It was a pleasure.   For your blood pressure Your blood pressure is well controlled today. Continue Hyzaar 100-25 mg daily  For your abdominal bloating Start Protonix 40 mg daily We have scheduled you with an appointment with [facility] for July 7 at 10 AM.  Please arrive to appointment 15 minutes before schedule time. I have sent a referral to the nutritionist.  They will call you with an appointment.  For your diabetes Continue current medications Your next A1c is due in July  I will be at the [facility] in [place].   The address is [address] [place] The number is [phone]  If you wish to continue your primary care services with me please call the above number to schedule appointment.  Just tell them that you are currently a patient of Dr. Loistine Chance to schedule an appointment.   Please follow-up with PCP in 2 weeks or sooner if symptoms worsen  If you have any questions or concerns, please do not hesitate to call the office at [phone].  Best,   Carollee Leitz, MD

## 2022-06-04 ENCOUNTER — Encounter: Payer: Self-pay | Admitting: Family Medicine

## 2022-06-04 NOTE — Assessment & Plan Note (Addendum)
Continues to have abdominal bloating.  Has previously seen GI and will need to follow-up with them.  Suspect IBS -constipation, possible gastroparesis.  Low suspicion for acute abdomen given no peritoneal symptoms on exam today. -GI appointment scheduled for patient -Continue MiraLAX daily -Continue Linzess daily -Start Protonix 40 mg daily -Nutrition consult for weight loss management, IBS and diabetic nutrition. -Follow-up with PCP in 2 weeks

## 2022-06-04 NOTE — Assessment & Plan Note (Signed)
At goal with increase in antihypertensive.  Asymptomatic -Continue Hyzaar 100-25 mg nightly -Follow-up with PCP as needed -Strict return precautions provided.

## 2022-06-05 ENCOUNTER — Telehealth: Payer: Self-pay | Admitting: *Deleted

## 2022-06-05 NOTE — Telephone Encounter (Signed)
Fax from pharmacy came in requesting a 90 day supply for patients pantoprazole.  It was recently sent in for a 30 day supply. Tytiana Coles Zimmerman Rumple, CMA

## 2022-06-05 NOTE — Telephone Encounter (Signed)
I only wanted a 30 day supply

## 2022-06-07 ENCOUNTER — Ambulatory Visit: Payer: Medicaid Other | Admitting: Family Medicine

## 2022-06-07 ENCOUNTER — Encounter: Payer: Self-pay | Admitting: Family Medicine

## 2022-06-07 VITALS — BP 137/73 | Ht 62.0 in | Wt 205.0 lb

## 2022-06-07 DIAGNOSIS — M722 Plantar fascial fibromatosis: Secondary | ICD-10-CM | POA: Diagnosis present

## 2022-06-07 NOTE — Progress Notes (Signed)
PCP: Carollee Leitz, MD  Subjective:   HPI: Patient is a 46 y.o. female here for bilateral heel pain.  5/17: Patient reports bilateral heel pain (R=L) over the past 8 months. There was no preceding trauma or injury. She was diagnosed with plantar fasciitis, for which she was seeing podiatry. She's had 3 injections in each side, most recently April 08, 2022. The injections help for 1-2 months but then her symptoms recur.   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 The pain is worse first thing in the morning and worse at the end of the day. She has been wearing [employer] sneakers with an OTC plantar fascia shoe insert without much improvement. Over the past week she has started rolling her arch on a ball and doing gentle calf stretches. Has not tried any oral medications, creams, strengthening exercises or other remedies.  6/21: Patient seen with interpreter. Patient reports she is doing much better. Doing home exercises and stretches. Rolling bottom of foot on a frozen water bottle and ball. Her sports insoles got wet and she would like another pair.  Past Medical History:  Diagnosis Date   Amenorrhea 02/18/2021   Anemia    Asymptomatic microscopic hematuria 10/22/2020   Atrial flutter ([place]) 02/16/2022   Blood transfusion without reported diagnosis    Breast pain, right 10/03/2017   Chest pain 02/15/2020   Chest pain with high risk of acute coronary syndrome 02/27/2015   Concussion 05/05/2016   Diabetes mellitus without complication ([place])    Dizziness 04/22/2022   Elevated troponin I level 02/26/2015   Hyperlipidemia    Hypertension    Hypokalemia    Irregular menses 07/02/2017   Leukocytosis 10/03/2016   Morbid obesity ([place])    Possible exposure to STD 02/11/2020   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 Use up to four times daily as directed. (FOR ICD-9 250.00, 250.01). 1 each 0   Blood Pressure KIT Check blood pressure twice a day.  Dx code: I10 1 kit 0   glucose blood (ACCU-CHEK GUIDE) test strip Use as instructed 350 strip 1   linaclotide (LINZESS) 145 MCG CAPS capsule Take 1 capsule (145 mcg total) by mouth daily before breakfast. 30 capsule 11   losartan-hydrochlorothiazide (HYZAAR) 100-25 MG tablet Take 1 tablet by mouth daily. 30 tablet 1   metFORMIN (GLUCOPHAGE-XR) 500 MG 24 hr tablet Take 2 tablets (1,000 mg total) by mouth in the morning and at bedtime. 360 tablet 3   pantoprazole (PROTONIX) 40 MG tablet Take 1 tablet (40 mg total) by mouth daily. 30 tablet 0   No current facility-administered medications on file prior to visit.    Past Surgical History:  Procedure Laterality Date   A-FLUTTER ABLATION N/A 02/16/2022   Procedure: A-FLUTTER ABLATION;  Surgeon: Vickie Epley, MD;  Location: [facility];  Service: Cardiovascular;  Laterality: N/A;   CESAREAN SECTION  1999 and 2011   x 2   LEFT HEART CATHETERIZATION WITH CORONARY ANGIOGRAM N/A 03/02/2015   Procedure: LEFT HEART CATHETERIZATION WITH CORONARY ANGIOGRAM;  Surgeon: Leonie Green  Ellyn Hack, MD;  Location: [facility] C[facility] LAB;  Service: Cardiovascular;  Laterality: N/A;   MINIMALLY INVASIVE EXCISION OF ATRIAL MYXOMA N/A 03/04/2015   Procedure: MINIMALLY INVASIVE RESECTION OF LEFT ATRIAL MYXOMA ( USING A BILAYER PATCH CLOSURE);  Surgeon: [name], MD;  Location: [facility];  Service: Open Heart Surgery;  Laterality: N/A;   myxoma N/A    chest   TEE WITHOUT CARDIOVERSION N/A 03/01/2015   Procedure: TRANSESOPHAGEAL ECHOCARDIOGRAM (TEE);  Surgeon: [name], MD;  Location: [facility] ENDOSCOPY;  Service:  Cardiovascular;  Laterality: N/A;   TEE WITHOUT CARDIOVERSION N/A 03/04/2015   Procedure: TRANSESOPHAGEAL ECHOCARDIOGRAM (TEE);  Surgeon: [name], MD;  Location: [facility];  Service: Open Heart Surgery;  Laterality: N/A;   TUBAL LIGATION  2011    No Known Allergies  BP 137/73   Ht 5' 2"  (1.575 m)   Wt 205 lb (93 kg)   BMI 37.49 kg/m       Objective:  Physical Exam:  Gen: NAD, comfortable in exam room  Bilateral feet: No gross deformity, swelling, ecchymoses FROM without pain. No TTP currently plantar fascia. Negative calcaneal squeeze. NV intact distally.   Assessment & Plan:  Bilateral plantar fasciitis - much improved with arch supports, home exercises and stretches.  Continue with these - new sports insoles with scaphoid pads provided today.  Consider physical therapy, [place] sock if she doesn't continue to improve.  F/u prn.

## 2022-06-07 NOTE — Patient Instructions (Signed)
Continue the exercises for 6 more weeks. Continue water bottle and ball massage to bottom of your feet. Use the inserts when you're on your feet for a long time like at work. Follow up with me as needed. I'm glad you're doing better!

## 2022-06-15 ENCOUNTER — Encounter: Payer: Self-pay | Admitting: *Deleted

## 2022-06-15 ENCOUNTER — Ambulatory Visit: Payer: Medicaid Other | Admitting: Family Medicine

## 2022-06-23 ENCOUNTER — Ambulatory Visit: Payer: Medicaid Other | Admitting: Physician Assistant

## 2022-07-03 ENCOUNTER — Other Ambulatory Visit: Payer: Self-pay | Admitting: Family Medicine

## 2022-07-11 ENCOUNTER — Other Ambulatory Visit: Payer: Self-pay

## 2022-07-11 MED ORDER — LOSARTAN POTASSIUM-HCTZ 100-25 MG PO TABS
1.0000 | ORAL_TABLET | Freq: Every day | ORAL | 1 refills | Status: DC
Start: 2022-07-11 — End: 2022-10-04

## 2022-07-12 ENCOUNTER — Encounter (HOSPITAL_BASED_OUTPATIENT_CLINIC_OR_DEPARTMENT_OTHER): Payer: Self-pay | Admitting: Obstetrics and Gynecology

## 2022-07-12 ENCOUNTER — Emergency Department (HOSPITAL_BASED_OUTPATIENT_CLINIC_OR_DEPARTMENT_OTHER): Payer: Medicaid Other | Admitting: Radiology

## 2022-07-12 ENCOUNTER — Emergency Department (HOSPITAL_BASED_OUTPATIENT_CLINIC_OR_DEPARTMENT_OTHER)
Admission: EM | Admit: 2022-07-12 | Discharge: 2022-07-12 | Disposition: A | Discharge status: Home / Self Care | Payer: Medicaid Other | Attending: Emergency Medicine | Admitting: Emergency Medicine

## 2022-07-12 ENCOUNTER — Other Ambulatory Visit: Payer: Self-pay

## 2022-07-12 DIAGNOSIS — E1165 Type 2 diabetes mellitus with hyperglycemia: Secondary | ICD-10-CM | POA: Diagnosis not present

## 2022-07-12 DIAGNOSIS — R072 Precordial pain: Secondary | ICD-10-CM | POA: Diagnosis not present

## 2022-07-12 DIAGNOSIS — Z7984 Long term (current) use of oral hypoglycemic drugs: Secondary | ICD-10-CM | POA: Diagnosis not present

## 2022-07-12 DIAGNOSIS — R519 Headache, unspecified: Secondary | ICD-10-CM | POA: Diagnosis present

## 2022-07-12 DIAGNOSIS — Z7901 Long term (current) use of anticoagulants: Secondary | ICD-10-CM | POA: Diagnosis not present

## 2022-07-12 LAB — TROPONIN I (HIGH SENSITIVITY)
Troponin I (High Sensitivity): 10 ng/L (ref <18)
Troponin I (High Sensitivity): 12 ng/L (ref <18)

## 2022-07-12 LAB — CBC
HCT: 43.7 % (ref 36.0–46.0)
Hemoglobin: 14.4 g/dL (ref 12.0–15.0)
MCH: 29.5 pg (ref 26.0–34.0)
MCHC: 33 g/dL (ref 30.0–36.0)
MCV: 89.5 fL (ref 80.0–100.0)
Platelets: 268 10*3/uL (ref 150–400)
RBC: 4.88 MIL/uL (ref 3.87–5.11)
RDW: 13.5 % (ref 11.5–15.5)
WBC: 8.6 10*3/uL (ref 4.0–10.5)
nRBC: 0 % (ref 0.0–0.2)

## 2022-07-12 LAB — BASIC METABOLIC PANEL
Anion gap: 8 (ref 5–15)
BUN: 11 mg/dL (ref 6–20)
CO2: 28 mmol/L (ref 22–32)
Calcium: 9.8 mg/dL (ref 8.9–10.3)
Chloride: 99 mmol/L (ref 98–111)
Creatinine, Ser: 0.57 mg/dL (ref 0.44–1.00)
GFR, Estimated: >60 mL/min (ref >60)
Glucose, Bld: 279 mg/dL — ABNORMAL HIGH (ref 70–99)
Potassium: 4.2 mmol/L (ref 3.5–5.1)
Sodium: 135 mmol/L (ref 135–145)

## 2022-07-12 LAB — CBG MONITORING, ED
Glucose-Capillary: 298 mg/dL — ABNORMAL HIGH (ref 70–99)
Glucose-Capillary: 128 mg/dL — ABNORMAL HIGH (ref 70–99)

## 2022-07-12 LAB — PREGNANCY, URINE: Preg Test, Ur: NEGATIVE

## 2022-07-12 MED ORDER — DIPHENHYDRAMINE HCL 50 MG/ML IJ SOLN
25.0000 mg | Freq: Once | INTRAMUSCULAR | Status: AC
  Administered 2022-07-12: 25 mg via INTRAVENOUS
  Filled 2022-07-12: qty 1

## 2022-07-12 MED ORDER — SODIUM CHLORIDE 0.9 % IV BOLUS
1000.0000 mL | Freq: Once | INTRAVENOUS | Status: AC
  Administered 2022-07-12: 1000 mL via INTRAVENOUS

## 2022-07-12 MED ORDER — SODIUM CHLORIDE 0.9 % IV SOLN
25.0000 mg | INTRAVENOUS | Status: DC | PRN
  Filled 2022-07-12: qty 0.5

## 2022-07-12 MED ORDER — METOCLOPRAMIDE HCL 5 MG/ML IJ SOLN
10.0000 mg | Freq: Once | INTRAMUSCULAR | Status: AC
  Administered 2022-07-12: 10 mg via INTRAVENOUS
  Filled 2022-07-12: qty 2

## 2022-07-12 NOTE — ED Notes (Signed)
Lab drawn including extra tubes SST; blue top and red top

## 2022-07-12 NOTE — ED Triage Notes (Signed)
Patient reports x3 days ago she had high blood sugar and since then has continued to have high blood sugar readings in the 400's, high blood pressure readings and today her chest started to hurt

## 2022-07-12 NOTE — ED Notes (Addendum)
Pt reports high blood sugar readings at home, 400 reading this AM and yesterday. Fatigue present. States takes metformin for diabetes, no insulin.

## 2022-07-12 NOTE — Discharge Instructions (Signed)
Debra Diaz cita con su doctor de cabezera pra hacerse chequeos regularmente.   Lame al cardiology para evaluar su corazon. Si sus simptomas empeoran regrese a Secretary/administrator.

## 2022-07-12 NOTE — ED Provider Notes (Signed)
[facility] EMERGENCY DEPT Provider Note   CSN: [id number] Arrival date & time: 07/12/22  1117     History  Chief Complaint  Patient presents with   Chest Pain   Hyperglycemia    Debra Diaz is a 46 y.o. female.  For 43-year-old female with a past medical history of diabetes, a flutter ablation presents to the ED with a chief complaint of hyperglycemia along with chest pain.  Patient reports that she felt her heart racing this morning, checked her blood sugar and it was around the 400s.  She endorses a heaviness to the substernal area of her chest without any radiation.  Pain dissipated afterwards.  She is concerned she recently had an ablation on March 2023.  She is also endorsing a headache for the past 3 days, feels like a constant pressure to the crown of her head radiating through her neck.  Has tried taking ibuprofen which resolved the symptoms however pain returns.  He is currently on metformin at 1000 mg twice daily and reports compliance with this.  There is no alleviating or exacerbating factors.  Denies any vision changes, extremity weakness.  No shortness of breath.   The history is provided by the patient and medical records.  Chest Pain Pain location:  Substernal area Pain quality: pressure   Pain radiates to:  Does not radiate Pain severity:  Moderate Onset quality:  Sudden Duration:  1 day Timing:  Intermittent Associated symptoms: no abdominal pain, no fever, no headache, no nausea and no vomiting   Hyperglycemia Associated symptoms: chest pain   Associated symptoms: no abdominal pain, no fever, no nausea and no vomiting        Home Medications Prior to Admission medications   Medication Sig Start Date End Date Taking? Authorizing Provider  apixaban (ELIQUIS) 5 MG TABS tablet TAKE 1 TABLET(5 MG) BY MOUTH TWICE DAILY 02/23/22   Vickie Epley, MD  blood glucose meter kit and supplies KIT Dispense based on patient and insurance  preference. Use up to four times daily as directed. (FOR ICD-9 250.00, 250.01). 06/03/21   Carollee Leitz, MD  Blood Pressure KIT Check blood pressure twice a day.  Dx code: I10 11/18/20   Carollee Leitz, MD  glucose blood (ACCU-CHEK GUIDE) test strip Use as instructed 12/26/21   Concepcion Living, MD  linaclotide [facility]) 145 MCG CAPS capsule Take 1 capsule (145 mcg total) by mouth daily before breakfast. 12/15/21   Milus Banister, MD  losartan-hydrochlorothiazide (HYZAAR) 100-25 MG tablet Take 1 tablet by mouth daily. 07/11/22   Holley Bouche, MD  metFORMIN (GLUCOPHAGE-XR) 500 MG 24 hr tablet Take 2 tablets (1,000 mg total) by mouth in the morning and at bedtime. 04/20/22 04/15/23  Carollee Leitz, MD  pantoprazole (PROTONIX) 40 MG tablet TAKE 1 TABLET(40 MG) BY MOUTH DAILY 07/03/22   Holley Bouche, MD      Allergies    Patient has no known allergies.    Review of Systems   Review of Systems  Constitutional:  Negative for fever.  HENT:  Negative for sore throat.   Cardiovascular:  Positive for chest pain.  Gastrointestinal:  Negative for abdominal pain, nausea and vomiting.  Genitourinary:  Negative for flank pain.  Neurological:  Negative for light-headedness and headaches.  All other systems reviewed and are negative.   Physical Exam Updated Vital Signs BP 128/80   Pulse (!) 44   Temp 98.7 F (37.1 C) (Oral)   Resp (!) 21   Ht  5' 2"  (1.575 m)   Wt 95.3 kg   SpO2 100%   BMI 38.41 kg/m  Physical Exam Vitals and nursing note reviewed.  Constitutional:      General: She is not in acute distress.    Appearance: She is well-developed.  HENT:     Head: Normocephalic and atraumatic.     Mouth/Throat:     Pharynx: No oropharyngeal exudate.  Eyes:     Pupils: Pupils are equal, round, and reactive to light.  Cardiovascular:     Rate and Rhythm: Regular rhythm.     Heart sounds: Normal heart sounds.  Pulmonary:     Effort: Pulmonary effort is normal. No respiratory distress.      Breath sounds: Normal breath sounds.  Abdominal:     General: Bowel sounds are normal. There is no distension.     Palpations: Abdomen is soft.     Tenderness: There is no abdominal tenderness.  Musculoskeletal:        General: No tenderness or deformity.     Cervical back: Normal range of motion.     Right lower leg: No edema.     Left lower leg: No edema.  Skin:    General: Skin is warm and dry.  Neurological:     Mental Status: She is alert and oriented to person, place, and time.     ED Results / Procedures / Treatments   Labs (all labs ordered are listed, but only abnormal results are displayed) Labs Reviewed  BASIC METABOLIC PANEL - Abnormal; Notable for the following components:      Result Value   Glucose, Bld 279 (*)    All other components within normal limits  CBG MONITORING, ED - Abnormal; Notable for the following components:   Glucose-Capillary 298 (*)    All other components within normal limits  CBG MONITORING, ED - Abnormal; Notable for the following components:   Glucose-Capillary 128 (*)    All other components within normal limits  CBC  PREGNANCY, URINE  TROPONIN I (HIGH SENSITIVITY)  TROPONIN I (HIGH SENSITIVITY)    EKG EKG Interpretation  Date/Time:  Wednesday July 12 2022 11:24:39 EDT Ventricular Rate:  53 PR Interval:  160 QRS Duration: 80 QT Interval:  422 QTC Calculation: 395 R Axis:   -12 Text Interpretation: Sinus bradycardia Minimal voltage criteria for LVH, may be normal variant ( R in aVL ) Borderline ECG When compared with ECG of 03-Mar-2022 09:59, No significant change was found when compared to prior, similar appearance. No sTEMI Confirmed by Antony Blackbird [phone]) on 07/12/2022 12:16:54 PM  Radiology DG Chest 2 View  Result Date: 07/12/2022 CLINICAL DATA:  Chest pain EXAM: CHEST - 2 VIEW COMPARISON:  10/26/2021 FINDINGS: Cardiac contour is mildly above the upper limit of normal. Normal mediastinal contours. No focal pulmonary  opacity. No pleural effusion or pneumothorax. No acute osseous abnormality. IMPRESSION: Mild cardiomegaly. Electronically Signed   By: Merilyn Baba M.D.   On: 07/12/2022 11:58    Procedures Procedures    Medications Ordered in ED Medications  metoCLOPramide (REGLAN) injection 10 mg (10 mg Intravenous Given 07/12/22 1653)  sodium chloride 0.9 % bolus 1,000 mL ( Intravenous Stopped 07/12/22 1755)  diphenhydrAMINE (BENADRYL) injection 25 mg (25 mg Intravenous Given 07/12/22 1652)    ED Course/ Medical Decision Making/ A&P                           Medical Decision Making  Amount and/or Complexity of Data Reviewed Labs: ordered. Radiology: ordered.  Risk Prescription drug management.   This patient presents to the ED for concern of hyperglycemia, chest pain, this involves a number of treatment options, and is a complaint that carries with it a high risk of complications and morbidity.  The differential diagnosis includes DKA, ACS versus infection.   Co morbidities: Discussed in HPI   Brief History:  Patient here with underlying history of diabetes, atrial flutter ablation presents to the ED with a chief complaint of hyperglycemia along with chest pain.  Reports checking her blood sugar noted to be in the 400 levels, got very concerned and began to feel pressure along the substernal area of her chest without any radiation.  She is concerned as her ablation was in the month of March 2023.  EMR reviewed including pt PMHx, past surgical history and past visits to ER.   See HPI for more details   Lab Tests:  I ordered and independently interpreted labs.  The pertinent results include:    I personally reviewed all laboratory work and imaging. Metabolic panel without any acute abnormality specifically kidney function within normal limits and no significant electrolyte abnormalities. CBC without leukocytosis or significant anemia.   Imaging Studies:  NAD. I personally reviewed all  imaging studies and no acute abnormality found. I agree with radiology interpretation.   Cardiac Monitoring:  The patient was maintained on a cardiac monitor.  I personally viewed and interpreted the cardiac monitored which showed an underlying rhythm of:  nsr EKG non-ischemic   Medicines ordered:  I ordered medication including reglan, benadryl  for headache Reevaluation of the patient after these medicines showed that the patient resolved I have reviewed the patients home medicines and have made adjustments as needed  Reevaluation:  After the interventions noted above I re-evaluated patient and found that they have :improved   Social Determinants of Health:  The patient's social determinants of health were a factor in the care of this patient    Problem List / ED Course:  Patient under line history of diabetes, atrial myxoma excised in 2016, recent atrial flutter ablation in March 2023 here with chest pain, elevated blood sugar.  Checked her glucose this morning as she was having some chest pressure and noted this to be high.  Also reports she has had a headache for the last couple of days and states nothing takes away the headache completely.  Has been alternating ibuprofen and Tylenol without much improvement in symptoms.  Labs in today's visit are without any electrolyte derangement, CBC is unremarkable with a stable hemoglobin. Patient received headache cocktail, rechecked afterwards with resolution in symptoms.  I have extensively reviewed patient's chart, prior to her atrial flutter ablation she did complain of headaches along with dizziness that has been chronic for her.  After the atrial flutter ablation she continues to have this chronic headaches.  There was no focal neuro sign on her exam today.  Her vitals are within normal limits, she is noted to be slightly bradycardic, however she is within her baseline from previous visits.  Discussed with her appropriate follow-up with  PCP, will need to keep a close eye on her blood sugar and strict return precautions.  She is agreeable of plan and treatment at this time.  EKG within normal limits, troponins x2 are unremarkable I have a lower suspicion for ischemia at this time.  She was hyperglycemic on arrival however no anion gap, no acidosis to  suggest DKA.  No signs of infection on x-ray to suggest pneumonia.   Dispostion:  After consideration of the diagnostic results and the patients response to treatment, I feel that the patent would benefit from outpatient follow-up with PCP.     Portions of this note were generated with [employer]. Dictation errors may occur despite best attempts at proofreading.   Final Clinical Impression(s) / ED Diagnoses Final diagnoses:  Bad headache    Rx / DC Orders ED Discharge Orders     None         Janeece Fitting, PA-C 07/12/22 1827    Tegeler, Gwenyth Allegra, MD 07/13/22 [phone]

## 2022-07-12 NOTE — Progress Notes (Unsigned)
07/13/2022 Debra Diaz [id number] Feb 28, 1976  Referring provider: Holley Bouche, MD Primary GI doctor: Dr. Ardis Hughs  ASSESSMENT AND PLAN:  IBS-C - Increase fiber/ water intake, decrease caffeine, increase activity level. -Will increase Linzess from 145 to 290, as patient has been doing much better on the twice a day at home.  -     linaclotide (LINZESS) 290 MCG CAPS capsule; Take 1 capsule (290 mcg total) by mouth daily before breakfast.  History of adenomatous polyp of colon 11/22/2021 colonoscopy Good bowel prep, 3 mm adenomatous polyp ascending colon otherwise unremarkable, recall 7 years   *Due to language barrier, an interpreter was present during the history-taking and subsequent discussion (and for part of the physical exam) with this patient.   History of Present Illness:  46 y.o. female  with a past medical history of sinus bradycardia and others listed below, returns to clinic today for evaluation of constipation/bloating/gas and nausea.  10/19/2021 office visit with Debra Newer, PA for chronic constipation abdominal pain-given Linzess samples and scheduled for colonoscopy 11/22/2021 colonoscopy Good bowel prep, 3 mm adenomatous polyp ascending colon otherwise unremarkable, recall 7 years  Went over colonoscopy results.   Started on 145 mcg Linzess, she will take 2 if she still  Can still be 2-3 days but better when she takes 2. Only have nausea when she is unable to have a BM.  Denies GERD, melena, dysphagia, hematochezia.    Current Medications:   Current Outpatient Medications (Endocrine & Metabolic):    metFORMIN (GLUCOPHAGE-XR) 500 MG 24 hr tablet, Take 2 tablets (1,000 mg total) by mouth in the morning and at bedtime.  Current Outpatient Medications (Cardiovascular):    losartan-hydrochlorothiazide (HYZAAR) 100-25 MG tablet, Take 1 tablet by mouth daily.    Current Outpatient Medications (Hematological):    apixaban (ELIQUIS) 5 MG TABS  tablet, TAKE 1 TABLET(5 MG) BY MOUTH TWICE DAILY  Current Outpatient Medications (Other):    blood glucose meter kit and supplies KIT, Dispense based on patient and insurance preference. Use up to four times daily as directed. (FOR ICD-9 250.00, 250.01).   Blood Pressure KIT, Check blood pressure twice a day.  Dx code: I10   glucose blood (ACCU-CHEK GUIDE) test strip, Use as instructed   linaclotide (LINZESS) 290 MCG CAPS capsule, Take 1 capsule (290 mcg total) by mouth daily before breakfast.  Surgical History:  She  has a past surgical history that includes Cesarean section (1999 and 2011); Tubal ligation (2011); TEE without cardioversion (N/A, 03/01/2015); left heart catheterization with coronary angiogram (N/A, 03/02/2015); Minimally invasive excision of atrial myxoma (N/A, 03/04/2015); TEE without cardioversion (N/A, 03/04/2015); myxoma (N/A); and A-FLUTTER ABLATION (N/A, 02/16/2022). Family History:  Her family history includes Asthma in her son; Breast cancer in her maternal grandmother; Diabetes in her father; Heart disease in her mother; Hypertension in her mother; Stomach cancer in her maternal grandfather. Social History:   reports that she quit smoking about 4 years ago. Her smoking use included cigarettes. She has been exposed to tobacco smoke. She has never used smokeless tobacco. She reports current alcohol use. She reports that she does not use drugs.  Current Medications, Allergies, Past Medical History, Past Surgical History, Family History and Social History were reviewed in [employer] record.  Physical Exam: BP 118/78   Pulse (!) 48   Ht 5' 2"  (1.575 m)   Wt 211 lb 6 oz (95.9 kg)   SpO2 98%   BMI 38.66 kg/m  General:  Pleasant, well developed female in no acute distress Heart : Regular rate and rhythm; no murmurs Pulm: Clear anteriorly; no wheezing Abdomen:  Soft, Obese AB, Active bowel sounds. mild tenderness in the LLQ. Without guarding and Without  rebound, No organomegaly appreciated. Rectal: Not evaluated Extremities:  without  edema. Neurologic:  Alert and  oriented x4;  No focal deficits.  Psych:  Cooperative. Normal mood and affect.   Vladimir Crofts, PA-C 07/13/22

## 2022-07-13 ENCOUNTER — Ambulatory Visit: Payer: Medicaid Other | Admitting: Physician Assistant

## 2022-07-13 ENCOUNTER — Encounter: Payer: Self-pay | Admitting: Physician Assistant

## 2022-07-13 VITALS — BP 118/78 | HR 48 | Ht 62.0 in | Wt 211.4 lb

## 2022-07-13 DIAGNOSIS — K5904 Chronic idiopathic constipation: Secondary | ICD-10-CM

## 2022-07-13 DIAGNOSIS — Z86010 Personal history of colonic polyps: Secondary | ICD-10-CM

## 2022-07-13 MED ORDER — LINACLOTIDE 290 MCG PO CAPS: 290.0000 ug | ORAL_CAPSULE | Freq: Every day | ORAL | 3 refills | Status: DC

## 2022-07-13 NOTE — Patient Instructions (Addendum)
Estreimiento crnico Chronic Constipation El estreimiento crnico es una afeccin de las personas que tienen tres o menos deposiciones por semana, durante un perodo de tres meses o ms. Esta afeccin es especialmente frecuente [employer]. Cules son las causas? Entre las causas del estreimiento crnico pueden incluirse las siguientes: No beber suficiente cantidad de lquido, no comer suficientes alimentos o fibra o no hacer actividad fsica. Embarazo. Un desgarro en el ano (fisura anal). Bloqueo del intestino (obstruccin intestinal). Estrechamiento del intestino (estenosis intestinal). Tener una afeccin a Barrister's clerk, como: Diabetes, hipotiroidismo o anemia por carencia de hierro. Accidente cerebrovascular o lesin en la mdula espinal. Esclerosis mltiple o enfermedad de Parkinson. Cncer de colon. Demencia. Enfermedad inflamatoria del intestino (EII), protrusin hacia afuera del recto (prolapso rectal) o hemorroides. Tomar ciertos medicamentos, como: Narcticos. Estos son un cierto tipo de analgsicos recetados. Anticidos o suplementos de hierro. Medicamentos para eliminar lquidos (diurticos). Ciertos medicamentos para la presin arterial. Medicamentos anticonvulsivos. Antidepresivos. Medicamentos para la enfermedad de Parkinson. Otras causas de esta afeccin incluyen las siguientes: Psychologist, forensic. Problemas en los nervios y msculos que controlan el movimiento de las heces. Debilidad o deterioro de los msculos del suelo plvico. Qu incrementa el riesgo? Puede tener un riesgo ms alto de estreimiento crnico si: Es mayor de 79 aos de edad. Es mujer. Vive en un centro de atencin a Barrister's clerk. Tiene una enfermedad a Barrister's clerk. Tiene un trastorno de salud mental o un trastorno alimenticio. Cules son los signos o sntomas? El principal sntoma del estreimiento crnico es Best boy tres o menos deposiciones por semana durante el transcurso de varias  semanas. Otros signos y sntomas pueden variar Howell Pringle persona y [place]. Esto incluye lo siguiente: Financial trader fuerte (esfuerzo) para defecar o tener heces duras o grumosas. Tiene dolor al defecar. Tener molestia en la parte baja del abdomen, como clicos o meteorismo. No ser capaz de defecar cuando siente la necesidad de hacer deposiciones o tener ganas de defecar despus de hacer deposiciones. Sentir que tiene algo en el recto que obstruye o impide las deposiciones. Notar sangre en el papel higinico o en las heces. Agravamiento de la confusin (en los [employer]). Cmo se diagnostica? Esta afeccin se puede diagnosticar en funcin de lo siguiente: Los sntomas y los antecedentes mdicos. Le harn preguntas sobre sus sntomas, estilo de vida, alimentacin y los medicamentos que tome. Un examen fsico. Le examinarn el abdomen. Posiblemente le hagan un examen rectal. Para este examen, el mdico coloca un dedo enguantado y lubricado en el recto. Otros estudios para buscar cualquier otra causa subyacente del estreimiento. Estos estudios pueden solicitarse si tiene Nutritional therapist, prdida de peso o antecedentes familiares de cncer de colon. En estos casos, es posible que le soliciten: Estudios de diagnstico por imgenes del colon. Estos pueden incluir radiografa, ecografa o exploracin por tomografa computarizada (TC). Anlisis de Colon. Un procedimiento para examinar el interior del colon (colonoscopa). Estudios ms especializados para determinar: Si su esfnter anal funciona bien. El esfnter anal es un msculo con forma de anillo que controla el cierre del ano. Cmo se mueven los alimentos por el colon. Estudios para Careers adviser en los msculos del suelo plvico (electromiografa). Cmo se trata? El tratamiento del estreimiento crnico depende de su causa. En la [employer], [place] tratamiento comienza por: [place] ms Cohan Stipes y Psychologist, prison and probation services  actividad fsica con regularidad. Beber ms lquidos. Agregar fibra a su dieta. Las fuentes de [place] son las frutas, las verduras,  los cereales integrales y los suplementos de [place]. Tomar medicamentos tales como laxantes o medicamentos para [place] contracciones en el aparato digestivo (procinticos). Entrenar los msculos plvicos con biorregulacin. Ciruga, si hay una obstruccin. El tratamiento tambin puede incluir lo siguiente: Quarry manager o dejar de usar ciertos medicamentos si le causan estreimiento. Usar un suplemento de Pharmacist, hospital (laxante formador de masa) o ablandador de las heces. Usar un laxante recetado. Este laxante acta al absorber agua en el colon (laxante osmtico). Es posible que tambin Designer, television/film set a un especialista en afecciones del aparato digestivo (gastroenterlogo). Siga estas instrucciones en su casa: Medicamentos Use los medicamentos de venta libre y los recetados solamente como se lo haya indicado el mdico. Si est tomando un laxante, tmelo como se lo haya indicado el mdico. Comida y bebida  Consuma una dieta equilibrada que incluya suficiente [place]. Pdale a su mdico que le recomiende una dieta adecuada para usted. Tome lquidos claros, especialmente, agua. Evite consumir alcohol, cafena y refrescos. Estos pueden Agricultural engineer. Beba suficiente lquido como para Theatre manager la orina de color amarillo plido. Instrucciones generales Runner, broadcasting/film/video de actividad fsica [employer]. Pregntele al mdico qu actividades son seguras para usted. Somtase a estudios de Pensions consultant de colon segn lo indicado por su mdico. Concurra a todas las visitas de seguimiento como se lo haya indicado el mdico. Esto es importante. Comunquese con un mdico si tiene: Tres o menos deposiciones por semana. Heces duras o grumosas. Sangre en el papel higinico o en sus heces despus de sus deposiciones. Prdida de peso sin causa aparente. Dolor en el recto  (rectal). Prdidas de heces. Nuseas o vmitos. Solicite ayuda inmediatamente si: Tiene hemorragia rectal o cogulos de sangre en sus deposiciones. Tiene dolor rectal intenso. Tiene tejido Orthoptist afuera (sobresale) del ano. Siente dolor intenso o meteorismo (distensin) en el abdomen. Tiene vmitos que no puede controlar. Resumen El estreimiento crnico es una afeccin de las personas que tienen tres o menos deposiciones por semana, durante un perodo de tres meses o ms. Es posible que tenga un riesgo mayor de Insurance risk surveyor esta afeccin si es un adulto mayor, es mujer o tiene una enfermedad a Barrister's clerk. El tratamiento de esta afeccin depende de la causa. La mayora de los tratamientos para el estreimiento crnico incluyen incorporar fibra a la dieta, beber ms lquidos y Field seismologist ms actividad fsica. Tambin es posible que sea necesario tratar otras afecciones subyacentes y dejar de tomar o cambiar ciertos medicamentos si causan estreimiento. Si los cambios en el estilo de vida no alivian el estreimiento, el mdico puede Veterinary surgeon. Esta informacin no tiene Marine scientist el consejo del mdico. Asegrese de hacerle al mdico cualquier pregunta que tenga. Document Revised: 01/09/2020 Document Reviewed: 01/09/2020 Elsevier Patient Education  [place].  I appreciate the opportunity to care for you. Vicie Mutters, PA-C

## 2022-07-13 NOTE — Progress Notes (Signed)
I agree with the above note, plan 

## 2022-07-15 ENCOUNTER — Other Ambulatory Visit: Payer: Self-pay

## 2022-07-15 ENCOUNTER — Emergency Department (HOSPITAL_COMMUNITY): Payer: Medicaid Other

## 2022-07-15 ENCOUNTER — Emergency Department (HOSPITAL_COMMUNITY)
Admission: EM | Admit: 2022-07-15 | Discharge: 2022-07-16 | Disposition: A | Discharge status: Home / Self Care | Payer: Medicaid Other | Attending: Emergency Medicine | Admitting: Emergency Medicine

## 2022-07-15 DIAGNOSIS — R0602 Shortness of breath: Secondary | ICD-10-CM | POA: Diagnosis not present

## 2022-07-15 DIAGNOSIS — R110 Nausea: Secondary | ICD-10-CM | POA: Diagnosis not present

## 2022-07-15 DIAGNOSIS — I10 Essential (primary) hypertension: Secondary | ICD-10-CM | POA: Diagnosis not present

## 2022-07-15 DIAGNOSIS — Z7984 Long term (current) use of oral hypoglycemic drugs: Secondary | ICD-10-CM | POA: Insufficient documentation

## 2022-07-15 DIAGNOSIS — Z20822 Contact with and (suspected) exposure to covid-19: Secondary | ICD-10-CM | POA: Insufficient documentation

## 2022-07-15 DIAGNOSIS — Z7901 Long term (current) use of anticoagulants: Secondary | ICD-10-CM | POA: Diagnosis not present

## 2022-07-15 DIAGNOSIS — E1165 Type 2 diabetes mellitus with hyperglycemia: Secondary | ICD-10-CM | POA: Insufficient documentation

## 2022-07-15 DIAGNOSIS — R7989 Other specified abnormal findings of blood chemistry: Secondary | ICD-10-CM | POA: Diagnosis not present

## 2022-07-15 DIAGNOSIS — Z79899 Other long term (current) drug therapy: Secondary | ICD-10-CM | POA: Diagnosis not present

## 2022-07-15 DIAGNOSIS — R739 Hyperglycemia, unspecified: Secondary | ICD-10-CM

## 2022-07-15 DIAGNOSIS — R55 Syncope and collapse: Secondary | ICD-10-CM | POA: Diagnosis present

## 2022-07-15 LAB — CBC WITH DIFFERENTIAL/PLATELET
Abs Immature Granulocytes: 0.09 10*3/uL — ABNORMAL HIGH (ref 0.00–0.07)
Basophils Absolute: 0.1 10*3/uL (ref 0.0–0.1)
Basophils Relative: 1 %
Eosinophils Absolute: 0.1 10*3/uL (ref 0.0–0.5)
Eosinophils Relative: 1 %
HCT: 42.9 % (ref 36.0–46.0)
Hemoglobin: 14.3 g/dL (ref 12.0–15.0)
Immature Granulocytes: 1 %
Lymphocytes Relative: 30 %
Lymphs Abs: 2.9 10*3/uL (ref 0.7–4.0)
MCH: 29.5 pg (ref 26.0–34.0)
MCHC: 33.3 g/dL (ref 30.0–36.0)
MCV: 88.6 fL (ref 80.0–100.0)
Monocytes Absolute: 0.6 10*3/uL (ref 0.1–1.0)
Monocytes Relative: 6 %
Neutro Abs: 6.1 10*3/uL (ref 1.7–7.7)
Neutrophils Relative %: 61 %
Platelets: 251 10*3/uL (ref 150–400)
RBC: 4.84 MIL/uL (ref 3.87–5.11)
RDW: 13.3 % (ref 11.5–15.5)
WBC: 9.8 10*3/uL (ref 4.0–10.5)
nRBC: 0 % (ref 0.0–0.2)

## 2022-07-15 LAB — PROTIME-INR
INR: 1.2 (ref 0.8–1.2)
Prothrombin Time: 14.6 seconds (ref 11.4–15.2)

## 2022-07-15 LAB — COMPREHENSIVE METABOLIC PANEL
ALT: 190 U/L — ABNORMAL HIGH (ref 0–44)
AST: 149 U/L — ABNORMAL HIGH (ref 15–41)
Albumin: 3.3 g/dL — ABNORMAL LOW (ref 3.5–5.0)
Alkaline Phosphatase: 144 U/L — ABNORMAL HIGH (ref 38–126)
Anion gap: 10 (ref 5–15)
BUN: 13 mg/dL (ref 6–20)
CO2: 23 mmol/L (ref 22–32)
Calcium: 9.2 mg/dL (ref 8.9–10.3)
Chloride: 101 mmol/L (ref 98–111)
Creatinine, Ser: 0.78 mg/dL (ref 0.44–1.00)
GFR, Estimated: >60 mL/min (ref >60)
Glucose, Bld: 305 mg/dL — ABNORMAL HIGH (ref 70–99)
Potassium: 3.7 mmol/L (ref 3.5–5.1)
Sodium: 134 mmol/L — ABNORMAL LOW (ref 135–145)
Total Bilirubin: 0.4 mg/dL (ref 0.3–1.2)
Total Protein: 6.5 g/dL (ref 6.5–8.1)

## 2022-07-15 LAB — D-DIMER, QUANTITATIVE: D-Dimer, Quant: 0.29 ug/mL-FEU (ref 0.00–0.50)

## 2022-07-15 LAB — TROPONIN I (HIGH SENSITIVITY): Troponin I (High Sensitivity): 19 ng/L — ABNORMAL HIGH (ref <18)

## 2022-07-15 NOTE — ED Notes (Signed)
Called lab regarding adding on troponin from previously collected labs. Per lab, they will add on.

## 2022-07-15 NOTE — Discharge Instructions (Addendum)
We did not find a dangerous cause of your syncope.  Your blood tests were reassuring.  You may have a viral infection contributing to your symptoms.

## 2022-07-15 NOTE — ED Triage Notes (Signed)
Patient BIB EMS from home after having a syncopal event in her backyard. Patient was having a family gathering and became SOB and had a syncopal episode. Pt denies ETOH use and states that she was at [place] 3 days ago. Pt denies SOB and pain at this time. Spanish speaking.

## 2022-07-16 LAB — RESP PANEL BY RT-PCR (FLU A&B, COVID) ARPGX2
Influenza A by PCR: NEGATIVE
Influenza B by PCR: NEGATIVE
SARS Coronavirus 2 by RT PCR: NEGATIVE

## 2022-07-16 LAB — I-STAT BETA HCG BLOOD, ED (MC, WL, AP ONLY): I-stat hCG, quantitative: <5 m[IU]/mL (ref <5)

## 2022-07-16 LAB — TROPONIN I (HIGH SENSITIVITY): Troponin I (High Sensitivity): 22 ng/L — ABNORMAL HIGH (ref <18)

## 2022-07-16 NOTE — ED Notes (Signed)
Discharge instructions reviewed with patient. Patient verbalized understanding of instructions. Follow-up care and medications were reviewed. Patient ambulatory with steady gait. VSS upon discharge.  ?

## 2022-07-16 NOTE — ED Provider Notes (Signed)
[facility] EMERGENCY DEPARTMENT Provider Note   CSN: [id number] Arrival date & time: 07/15/22  1901     History  Chief Complaint  Patient presents with   Loss of Consciousness    Debra Diaz is a 46 y.o. female with history of hypertension, diabetes, atrial flutter on apixaban presenting with near syncope. patient reports that she was sitting outside during daytime for 2 hours with family.  Overall was feeling well,, sat down and developed lightheadedness, nausea.  Reports some shortness of breath at this time.  She reports currently, she feels back to baseline.  Her family reports that she may have lost consciousness for a very short amount of time, less than a few seconds.  She denies any vision changes, headaches, chest pain, fevers, chills, sore throat, runny nose, abdominal pain, nausea, vomiting, vaginal bleeding.  Denies similar symptoms in the past    Loss of Consciousness      Home Medications Prior to Admission medications   Medication Sig Start Date End Date Taking? Authorizing Provider  apixaban (ELIQUIS) 5 MG TABS tablet TAKE 1 TABLET(5 MG) BY MOUTH TWICE DAILY 02/23/22   Vickie Epley, MD  blood glucose meter kit and supplies KIT Dispense based on patient and insurance preference. Use up to four times daily as directed. (FOR ICD-9 250.00, 250.01). 06/03/21   Carollee Leitz, MD  Blood Pressure KIT Check blood pressure twice a day.  Dx code: I10 11/18/20   Carollee Leitz, MD  glucose blood (ACCU-CHEK GUIDE) test strip Use as instructed 12/26/21   Concepcion Living, MD  linaclotide [facility]) 290 MCG CAPS capsule Take 1 capsule (290 mcg total) by mouth daily before breakfast. 07/13/22   Vladimir Crofts, PA-C  losartan-hydrochlorothiazide (HYZAAR) 100-25 MG tablet Take 1 tablet by mouth daily. 07/11/22   Holley Bouche, MD  metFORMIN (GLUCOPHAGE-XR) 500 MG 24 hr tablet Take 2 tablets (1,000 mg total) by mouth in the morning and at bedtime. 04/20/22  04/15/23  Carollee Leitz, MD      Allergies    Patient has no known allergies.    Review of Systems   Review of Systems  Cardiovascular:  Positive for syncope.  See HPI  Physical Exam Updated Vital Signs BP 116/66   Pulse (!) 50   Temp (!) 100.4 F (38 C) (Oral)   Resp (!) 24   SpO2 97%  Physical Exam Constitutional:      General: She is not in acute distress.    Appearance: She is well-developed.  HENT:     Head: Normocephalic and atraumatic.     Mouth/Throat:     Mouth: Mucous membranes are moist.  Eyes:     Pupils: Pupils are equal, round, and reactive to light.  Cardiovascular:     Rate and Rhythm: Normal rate and regular rhythm.     Heart sounds: No murmur heard. Pulmonary:     Effort: Pulmonary effort is normal. No respiratory distress.     Breath sounds: Normal breath sounds.  Abdominal:     General: Abdomen is flat.     Palpations: Abdomen is soft.     Tenderness: There is no abdominal tenderness.  Musculoskeletal:        General: No tenderness.     Right lower leg: No edema.     Left lower leg: No edema.  Skin:    General: Skin is warm and dry.  Neurological:     General: No focal deficit present.  Mental Status: She is alert. Mental status is at baseline.  Psychiatric:        Mood and Affect: Mood normal.        Behavior: Behavior normal.     ED Results / Procedures / Treatments   Labs (all labs ordered are listed, but only abnormal results are displayed) Labs Reviewed  COMPREHENSIVE METABOLIC PANEL - Abnormal; Notable for the following components:      Result Value   Sodium 134 (*)    Glucose, Bld 305 (*)    Albumin 3.3 (*)    AST 149 (*)    ALT 190 (*)    Alkaline Phosphatase 144 (*)    All other components within normal limits  CBC WITH DIFFERENTIAL/PLATELET - Abnormal; Notable for the following components:   Abs Immature Granulocytes 0.09 (*)    All other components within normal limits  TROPONIN I (HIGH SENSITIVITY) - Abnormal;  Notable for the following components:   Troponin I (High Sensitivity) 19 (*)    All other components within normal limits  TROPONIN I (HIGH SENSITIVITY) - Abnormal; Notable for the following components:   Troponin I (High Sensitivity) 22 (*)    All other components within normal limits  RESP PANEL BY RT-PCR (FLU A&B, COVID) [id number]  D-DIMER, QUANTITATIVE (NOT AT [facility])  PROTIME-INR  I-STAT BETA HCG BLOOD, ED ([facility], [facility], [facility] ONLY)  TROPONIN I (HIGH SENSITIVITY)    EKG None  Radiology DG Chest 1 View  Result Date: 07/15/2022 CLINICAL DATA:  Chest pain EXAM: PORTABLE CHEST 1 VIEW COMPARISON:  07/12/2022 FINDINGS: Cardiac shadow is stable. Lungs are clear. No bony abnormality is noted. IMPRESSION: No active disease. Electronically Signed   By: Mark  Lukens M.D.   On: 07/15/2022 20:34    Procedures Procedures    Medications Ordered in ED Medications - No data to display  ED Course/ Medical Decision Making/ A&P Clinical Course as of 07/16/22 0052  Sat Jul 15, 2022  2120 ECG not uploaded but NSR without acute ST or T wave changes concerning for acute ischemia, sign of LVH.  [WS]    Clinical Course User Index [WS] Scheving, William L, MD                           Medical Decision Making Amount and/or Complexity of Data Reviewed Labs: ordered. Radiology: ordered. ECG/medicine tests: ordered.   46-year-old female presenting to the emergency department with syncope versus near syncope.  Patient overall well-appearing, vitals notable for bradycardia, EKG with sinus arrhythmia.  Patient incidentally febrile but denies any infectious symptoms.  Unclear cause of episode, could be vasovagal syncope in the setting of dehydration and being outdoors in the heat.  Patient denies any ongoing symptoms at this time.  Low concern for cardiac episode with no palpitations, chest pain, EKG without evidence of ischemia.  Initial troponin borderline elevated will obtain repeat.  Obtained D-dimer which  was negative, very low concern for PE.  Will obtain COVID swab given fever.  Patient denies any other infectious symptoms, chest x-ray without evidence of pneumonia, patient denies dysuria to suggest UTI.  No abdominal tenderness to stress intra-abdominal infection.  No headache or neck stiffness to suggest meningitis or CNS infection.  No back pain to suggest occult spinal infection.  No rashes to suggest skin or soft tissue infection.  No concern for pregnancy as cause of near syncope with normal hemoglobin but will obtain pregnancy test to evaluate further.    Signed out to oncoming provider pending repeat troponin, pregnancy test.  If repeat troponin unchanged and pregnancy test negative, anticipate likely discharge with outpatient follow-up with patient's existing cardiologist and primary care physician.  Although febrile, no clear signs of infection,          Final Clinical Impression(s) / ED Diagnoses Final diagnoses:  Near syncope    Rx / DC Orders ED Discharge Orders     None         Cristie Hem, MD 07/16/22 [phone]

## 2022-07-16 NOTE — ED Provider Notes (Signed)
I utilized interpreter [phone] at time of discharge.  Patient feels improved.  She has been ambulatory.  Vitals appropriate.  We discussed lab findings.  She denies any chest pain.  Overall she is appropriate for outpatient management.  I advised follow-up as an outpatient for recheck of her labs including her hyperglycemia, she also has mild elevation of her LFTs. Patient is requesting discharge home       Ripley Fraise, MD 07/16/22 [phone]

## 2022-07-17 ENCOUNTER — Ambulatory Visit: Payer: Medicaid Other | Admitting: Family Medicine

## 2022-07-17 VITALS — BP 128/72 | Ht 62.0 in | Wt 210.0 lb

## 2022-07-17 DIAGNOSIS — M25562 Pain in left knee: Secondary | ICD-10-CM | POA: Diagnosis present

## 2022-07-17 NOTE — Progress Notes (Unsigned)
PCP: Holley Bouche, MD  Subjective:   HPI: Patient is a 46 y.o. female here for left knee pain.  Patient describes several years of ongoing left knee pain localized. Remote history of falling and landing her knee on a lego. Her pain is primarily located along the anterior medial aspect of her knee. Walking, going up and down stairs, and rolling over at night make her pain worse. She has been taking ibuprofen for pain without much relief. She has clicking while walking, though has never had locking or felt like her knee will give out.   Past Medical History:  Diagnosis Date   Amenorrhea 02/18/2021   Anemia    Asymptomatic microscopic hematuria 10/22/2020   Atrial flutter ([place]) 02/16/2022   Blood transfusion without reported diagnosis    Breast pain, right 10/03/2017   Chest pain 02/15/2020   Chest pain with high risk of acute coronary syndrome 02/27/2015   Concussion 05/05/2016   Diabetes mellitus without complication (HCC)    Dizziness 04/22/2022   Elevated troponin I level 02/26/2015   Hyperlipidemia    Hypertension    Hypokalemia    Irregular menses 07/02/2017   Leukocytosis 10/03/2016   Morbid obesity ([place])    Possible exposure to STD 02/11/2020   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 Use up to four times daily as directed. (FOR ICD-9 250.00, 250.01). 1 each 0   Blood Pressure KIT Check blood pressure twice a day.  Dx code: I10 1 kit 0   glucose blood (ACCU-CHEK GUIDE) test strip Use as instructed 350 strip 1   linaclotide (LINZESS) 290 MCG CAPS capsule  Take 1 capsule (290 mcg total) by mouth daily before breakfast. 90 capsule 3   losartan-hydrochlorothiazide (HYZAAR) 100-25 MG tablet Take 1 tablet by mouth daily. 90 tablet 1   metFORMIN (GLUCOPHAGE-XR) 500 MG 24 hr tablet Take 2 tablets (1,000 mg total) by mouth in the morning and at bedtime. 360 tablet 3   No current facility-administered medications on file prior to visit.    Past Surgical History:  Procedure Laterality Date   A-FLUTTER ABLATION N/A 02/16/2022   Procedure: A-FLUTTER ABLATION;  Surgeon: Vickie Epley, MD;  Location: [facility] CV LAB;  Service: Cardiovascular;  Laterality: N/A;   CESAREAN SECTION  1999 and 2011   x 2   LEFT HEART CATHETERIZATION WITH CORONARY ANGIOGRAM N/A 03/02/2015   Procedure: LEFT HEART CATHETERIZATION WITH CORONARY ANGIOGRAM;  Surgeon: [name], MD;  Location: [facility] CATH LAB;  Service: Cardiovascular;  Laterality: N/A;   MINIMALLY INVASIVE EXCISION OF ATRIAL MYXOMA N/A 03/04/2015   Procedure: MINIMALLY INVASIVE RESECTION OF LEFT ATRIAL MYXOMA ( USING A BILAYER PATCH CLOSURE);  Surgeon: [name], MD;  Location: [facility];  Service: Open Heart Surgery;  Laterality: N/A;   myxoma N/A    chest   TEE WITHOUT CARDIOVERSION N/A 03/01/2015   Procedure: TRANSESOPHAGEAL ECHOCARDIOGRAM (TEE);  Surgeon: [name], MD;  Location: [facility] ENDOSCOPY;  Service: Cardiovascular;  Laterality: N/A;   TEE WITHOUT CARDIOVERSION N/A 03/04/2015   Procedure: TRANSESOPHAGEAL ECHOCARDIOGRAM (  TEE);  Surgeon: [name], MD;  Location: [facility];  Service: Open Heart Surgery;  Laterality: N/A;   TUBAL LIGATION  2011    No Known Allergies  BP 128/72   Ht 5' 2"  (1.575 m)   Wt 210 lb (95.3 kg)   BMI 38.41 kg/m       No data to display              No data to display              Objective:  Physical Exam:  Gen: NAD, comfortable in exam room Knee, left: Inspection was negative for erythema, ecchymosis, and effusion. No obvious bony abnormalities  or signs of osteophyte development. Palpation yielded no asymmetric warmth; Medial joint line tenderness; No condyle tenderness; No patellar tenderness; Knee crepitus noted. Patellar and quadriceps tendons unremarkable. ROM normal in flexion (135 degrees) and extension (0 degrees). Strength 5/5 with knee flexion and extension.    Provocative Testing:    - Patella:   - Patellar grind/compression: Positive   - Patellar glide: Appropriate medial/lateral glide without apprehension - Cruciate Ligaments:   - Anterior Drawer/Lachman test: NEG - Posterior Drawer: NEG  - Collateral Ligaments:   - Varus/Valgus (MCL/LCL) Stress test at 0, 15d: NEG  - Meniscus: - Apley's Compression/Distraction test: Positive on the medial side   - Thessaly: Positive on the medial side   - McMurray's: Positive on the medial side    Assessment & Plan:  1. Left knee pain likely 2/2 medial meniscus tear. Recommend 6 weeks of physical therapy. Can use Tylenol and Voltaren gel for pain relief (avoid NSAIDs as she is on apixiban). Can consider MRI if no improvement. Follow up in 6 weeks.   Virgel Manifold, MS4

## 2022-07-17 NOTE — Patient Instructions (Signed)
I'm concerned you have a medial meniscus tear. Start physical therapy and do home exercises on days you don't go to therapy. Use topical voltaren gel up to 4 times a day. Tylenol '500mg'$  1-2 tabs up to 3 times a day as needed for pain. Ice as needed 15 minutes at a time. Follow up with me in 6 weeks - if not improving would consider MRI at that point.

## 2022-07-28 ENCOUNTER — Ambulatory Visit: Payer: Medicaid Other | Admitting: Student

## 2022-08-04 ENCOUNTER — Ambulatory Visit: Payer: Medicaid Other

## 2022-08-05 ENCOUNTER — Encounter: Payer: Self-pay | Admitting: Rehabilitative and Restorative Service Providers"

## 2022-08-05 ENCOUNTER — Ambulatory Visit: Payer: Medicaid Other | Attending: Family Medicine | Admitting: Rehabilitative and Restorative Service Providers"

## 2022-08-05 ENCOUNTER — Other Ambulatory Visit: Payer: Self-pay

## 2022-08-05 DIAGNOSIS — M25562 Pain in left knee: Secondary | ICD-10-CM | POA: Diagnosis present

## 2022-08-05 DIAGNOSIS — G8929 Other chronic pain: Secondary | ICD-10-CM | POA: Diagnosis present

## 2022-08-05 NOTE — Therapy (Addendum)
OUTPATIENT PHYSICAL THERAPY LOWER EXTREMITY EVALUATION/Discharge Summary   Patient Name: Debra Diaz MRN: [id number] DOB:11/14/1976, 46 y.o., female Today's Date: 08/05/2022   PT End of Session - 08/05/22 0936     Visit Number 1    Number of Visits 12    Date for PT Re-Evaluation 09/16/22    Authorization Type MCD    Progress Note Due on Visit 10    PT Start Time 8032    PT Stop Time 0939    PT Time Calculation (min) 44 min    Activity Tolerance Patient tolerated treatment well;No increased pain    Behavior During Therapy [facility] for tasks assessed/performed             Past Medical History:  Diagnosis Date   Amenorrhea 02/18/2021   Anemia    Asymptomatic microscopic hematuria 10/22/2020   Atrial flutter ([place]) 02/16/2022   Blood transfusion without reported diagnosis    Breast pain, right 10/03/2017   Chest pain 02/15/2020   Chest pain with high risk of acute coronary syndrome 02/27/2015   Concussion 05/05/2016   Diabetes mellitus without complication ([place])    Dizziness 04/22/2022   Elevated troponin I level 02/26/2015   Hyperlipidemia    Hypertension    Hypokalemia    Irregular menses 07/02/2017   Leukocytosis 10/03/2016   Morbid obesity ([place])    Possible exposure to STD 02/11/2020   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   Screening for STD (sexually transmitted disease) 12/26/2021   Tubular adenoma of colon    Vaginal itching 12/26/2021   Past Surgical History:  Procedure Laterality Date   A-FLUTTER ABLATION N/A 02/16/2022   Procedure: A-FLUTTER ABLATION;  Surgeon: Vickie Epley, MD;  Location: [place] CV LAB;  Service: Cardiovascular;  Laterality: N/A;   CESAREAN SECTION  1999 and 2011   x 2   LEFT HEART CATHETERIZATION WITH CORONARY ANGIOGRAM N/A 03/02/2015   Procedure: LEFT HEART CATHETERIZATION WITH CORONARY ANGIOGRAM;  Surgeon: [name], MD;  Location: [facility] CATH LAB;  Service: Cardiovascular;   Laterality: N/A;   MINIMALLY INVASIVE EXCISION OF ATRIAL MYXOMA N/A 03/04/2015   Procedure: MINIMALLY INVASIVE RESECTION OF LEFT ATRIAL MYXOMA ( USING A BILAYER PATCH CLOSURE);  Surgeon: [name], MD;  Location: [place];  Service: Open Heart Surgery;  Laterality: N/A;   myxoma N/A    chest   TEE WITHOUT CARDIOVERSION N/A 03/01/2015   Procedure: TRANSESOPHAGEAL ECHOCARDIOGRAM (TEE);  Surgeon: [name], MD;  Location: [facility] ENDOSCOPY;  Service: Cardiovascular;  Laterality: N/A;   TEE WITHOUT CARDIOVERSION N/A 03/04/2015   Procedure: TRANSESOPHAGEAL ECHOCARDIOGRAM (TEE);  Surgeon: [name], MD;  Location: [place];  Service: Open Heart Surgery;  Laterality: N/A;   TUBAL LIGATION  2011   Patient Active Problem List   Diagnosis Date Noted   Typical atrial flutter ([place]) 01/09/2022   Secondary hypercoagulable state ([place]) 01/09/2022   Hyperlipidemia associated with type 2 diabetes mellitus ([place]) 06/03/2021   Abdominal discomfort 02/18/2021   Bradycardia 02/20/2020   Abnormal uterine bleeding 02/11/2020   Genital warts 02/11/2020   Constipation 01/09/2018   Anxiety reaction 08/15/2016   Healthcare maintenance 06/12/2016   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   SUBJECTIVE:   SUBJECTIVE STATEMENT: I have a pain right here and it feels like I have a block stuck right here (points to L medial knee).   PERTINENT HISTORY: Per MD chart review, pain likely 2/2 medial meniscal tear.   PAIN:  Are you having pain?  Yes: NPRS scale: 7/10 Pain location: L medial knee Pain description: blocked Aggravating factors: straightening out knee and bending knee Relieving factors: ibuprofen  PRECAUTIONS: Other: needs interpreter sometimes; pt will let PT know when one is needed  WEIGHT BEARING RESTRICTIONS No  FALLS:  Has patient fallen in last 6 months? No  LIVING ENVIRONMENT: Lives with: lives with their family Lives in: House/apartment Stairs: No Has following equipment at home:  none  OCCUPATION: cleans houses  PLOF: Independent  PATIENT GOALS to make everything easier with my knee   OBJECTIVE:   DIAGNOSTIC FINDINGS: none  PATIENT SURVEYS:  LEFS 41/80  COGNITION:  Overall cognitive status: Within functional limits for tasks assessed     SENSATION: WFL  EDEMA: none   POSTURE:  overweight , well developed bil Gastrocs  PALPATION: Decreased L patella mobility all directions but with emphasis on superior and inferior, R tibial valgus, decreased muscle tone L hip adductor and medial quad, malleoli even, L patella tendon angled lateral to patella, tight hip flexors  LOWER EXTREMITY ROM:  Active ROM Right eval Left eval  Hip flexion WNL WNL  Hip extension limited limited  Hip abduction WNL WNL  Hip adduction WNL WNL  Hip internal rotation [facility] WFL  Hip external rotation [facility]  Knee flexion WNL WNL but with pain at end ROM  Knee extension + 4 degrees -2 degrees  Ankle dorsiflexion [facility]  Ankle plantarflexion    Ankle inversion    Ankle eversion     (Blank rows = not tested)  LOWER EXTREMITY MMT:  MMT Right eval Left eval  Hip flexion 5/5 4+/5  Hip extension 5/5 4+/5  Hip abduction 4+/5 4/5  Hip adduction 4+/5 4/5  Hip internal rotation    Hip external rotation    Knee flexion 4+/5 4+/5  Knee extension 4+/5 4+/5  Ankle dorsiflexion 5/5 5/5  Ankle plantarflexion    Ankle inversion    Ankle eversion     (Blank rows = not tested)  LOWER EXTREMITY SPECIAL  TESTS:  Knee special tests: Apley's compression test: positive medial side; patella grind/compression positive, anterior drawer/lachman -. Varus/Valgus -, McMurray's + on medial side  GAIT: Distance walked: in gym Assistive device utilized: none Level of assistance: Complete Independence Comments: no significant findings    TODAY'S TREATMENT: At eval: HEP, reviewed POC   PATIENT EDUCATION:  Education details: HEP Person educated: Patient Education method: Consulting civil engineer, Demonstration, and Handouts Education comprehension: verbalized understanding and returned demonstration   HOME EXERCISE PROGRAM: Access Code: [email or web address] URL: [email or web address] Date: 08/05/2022 Prepared by: America Brown  Exercises - Supine Hamstring Stretch  - 2 x daily - 7 x weekly - 1 sets - 3 reps - 30 sec hold - Straight Leg Raise with External Rotation  - 2 x daily - 7 x weekly - 1 sets - 10 reps - Supine Hip Adduction Isometric with Ball  - 2 x daily - 7 x weekly - 1 sets - 10 reps -  5 sec hold  ASSESSMENT:  CLINICAL IMPRESSION: Patient is a 46 y.o. female who was seen today for physical therapy evaluation and treatment for L knee pain with  medial meniscal involvement. She demonstrates abnormal femur/patella/tibial positioning L and does have orthotics. Angulation of L LE is producing extra strain on L medial knee. She is not TTP to L medial knee and does exhibit L decreased patella mobility all directions with emphasis on superior and inferior. She does have a muscle imbalance L compared to R. Pt would benefit from PT for L proper knee strengthening and manual therapy to address L patella hypomobility. She also has tight hamstrings and hip flexors.  Modalities can be used as indicated   OBJECTIVE IMPAIRMENTS decreased activity tolerance, decreased strength, hypomobility, impaired flexibility, and pain.   ACTIVITY LIMITATIONS carrying, lifting, bending, sitting, standing, squatting, sleeping,  stairs, transfers, bed mobility, and locomotion level  PARTICIPATION LIMITATIONS: cleaning, shopping, community activity, and occupation  PERSONAL FACTORS 1 comorbidity: fitness  are also affecting patient's functional outcome.   REHAB POTENTIAL: Good  CLINICAL DECISION MAKING: Stable/uncomplicated  EVALUATION COMPLEXITY: Low   GOALS: Goals reviewed with patient? Yes  SHORT TERM GOALS: Target date: 08/26/2022  Pt will be able to bridge in bed with </= 3/10 L knee pain  Baseline: 7/10 Goal status: INITIAL  2.  Pt will be able to sit on edge with </= 2/10 pain L knee Baseline: 5/10 Goal status: INITIAL  3.  Pt will be able to mop with 50% less difficulty Baseline: 100% with weightshift Goal status: INITIAL  4.  Pt will be indep with initial HEP  Baseline: issued at eval Goal status: INITIAL    LONG TERM GOALS: Target date: 09/16/2022   Pt will be go up/down stairs with </=3/10 L knee pain carrying work supplies Baseline: 7/10 Goal status: INITIAL   2.  Pt will be able to transition to kneeling to stand with </= 3/10 L knee pain Baseline: 9/10 Goal status: INITIAL  3.  Pt will have improved LEFS score to >/= 60/80 to demo improvement in functional mobility Baseline: 41/80 Goal status: INITIAL  4.  Pt will report overall improvement of L knee pain x 75% with functional activities Baseline: constant, 0% Goal status: INITIAL  5.  Pt will demo improvement in L knee strength >/=1/2 MMT grade to assist with function Baseline:  MMT Right eval Left eval  Hip flexion 5/5 4+/5  Hip extension 5/5 4+/5  Hip abduction 4+/5 4/5  Hip adduction 4+/5 4/5  Hip internal rotation    Hip external rotation    Knee flexion 4+/5 4+/5  Knee extension 4+/5 4+/5  Ankle dorsiflexion 5/5 5/5   Goal status: INITIAL     PLAN: PT FREQUENCY: 2x/week  PT DURATION: 6 weeks  PLANNED INTERVENTIONS: Therapeutic exercises, Therapeutic activity, Gait training, Patient/Family  education, Stair training, Dry Needling, Cryotherapy, Moist heat, Taping, Vasopneumatic device, Ultrasound, Manual therapy, and Re-evaluation  PLAN FOR NEXT SESSION: review HEP, L patella mobility   America Brown, PT 08/05/2022, 9:40 AM   Check all possible CPT codes: 97164 - PT Re-evaluation, 97110- Therapeutic Exercise, 97140 - Manual Therapy, 97530 - Therapeutic Activities, and [phone] - Ultrasound     If treatment provided at initial evaluation, no treatment charged due to lack of authorization.    PHYSICAL THERAPY DISCHARGE SUMMARY  Visits from Start of Care: 1  Current functional level related to goals / functional outcomes: Unable to assess   Remaining deficits: Unable to assess  Education / Equipment: HEP on eval   Patient agrees to discharge. Patient goals were not met. Patient is being discharged due to not returning since the last visit.  Leeroy Cha PT, DPT 08/18/2022 1:26 PM

## 2022-08-08 ENCOUNTER — Encounter: Payer: Medicaid Other | Admitting: Physical Therapy

## 2022-08-08 ENCOUNTER — Telehealth: Payer: Self-pay | Admitting: Physical Therapy

## 2022-08-08 ENCOUNTER — Ambulatory Visit: Payer: Medicaid Other | Admitting: Student

## 2022-08-08 NOTE — Telephone Encounter (Signed)
Called and informed patient of missed visit and provided reminder of next appt and attendance policy.  

## 2022-08-12 ENCOUNTER — Ambulatory Visit: Payer: Medicaid Other

## 2022-08-15 ENCOUNTER — Ambulatory Visit: Payer: Medicaid Other

## 2022-08-17 ENCOUNTER — Encounter: Payer: Medicaid Other | Admitting: Registered"

## 2022-08-18 ENCOUNTER — Ambulatory Visit: Payer: Medicaid Other | Attending: Family Medicine | Admitting: Physical Therapy

## 2022-08-18 ENCOUNTER — Telehealth: Payer: Self-pay | Admitting: Physical Therapy

## 2022-08-18 NOTE — Telephone Encounter (Signed)
Third no show, called and left voicemail utilizing [employer] video interpreting services to notify patient of attendance policy and discharge, need for new referral to resume PT.

## 2022-08-22 ENCOUNTER — Ambulatory Visit: Payer: Medicaid Other

## 2022-08-26 ENCOUNTER — Ambulatory Visit: Payer: Medicaid Other

## 2022-08-26 ENCOUNTER — Encounter (HOSPITAL_BASED_OUTPATIENT_CLINIC_OR_DEPARTMENT_OTHER): Payer: Self-pay

## 2022-08-26 ENCOUNTER — Emergency Department (HOSPITAL_BASED_OUTPATIENT_CLINIC_OR_DEPARTMENT_OTHER): Payer: Medicaid Other

## 2022-08-26 ENCOUNTER — Emergency Department (HOSPITAL_BASED_OUTPATIENT_CLINIC_OR_DEPARTMENT_OTHER)
Admission: EM | Admit: 2022-08-26 | Discharge: 2022-08-26 | Disposition: A | Discharge status: Home / Self Care | Payer: Medicaid Other | Attending: Emergency Medicine | Admitting: Emergency Medicine

## 2022-08-26 ENCOUNTER — Other Ambulatory Visit: Payer: Self-pay

## 2022-08-26 ENCOUNTER — Emergency Department (HOSPITAL_BASED_OUTPATIENT_CLINIC_OR_DEPARTMENT_OTHER): Payer: Medicaid Other | Admitting: Radiology

## 2022-08-26 DIAGNOSIS — Z7901 Long term (current) use of anticoagulants: Secondary | ICD-10-CM | POA: Diagnosis not present

## 2022-08-26 DIAGNOSIS — E785 Hyperlipidemia, unspecified: Secondary | ICD-10-CM | POA: Diagnosis not present

## 2022-08-26 DIAGNOSIS — I10 Essential (primary) hypertension: Secondary | ICD-10-CM | POA: Insufficient documentation

## 2022-08-26 DIAGNOSIS — Z7984 Long term (current) use of oral hypoglycemic drugs: Secondary | ICD-10-CM | POA: Insufficient documentation

## 2022-08-26 DIAGNOSIS — Z87891 Personal history of nicotine dependence: Secondary | ICD-10-CM | POA: Insufficient documentation

## 2022-08-26 DIAGNOSIS — Z79899 Other long term (current) drug therapy: Secondary | ICD-10-CM | POA: Diagnosis not present

## 2022-08-26 DIAGNOSIS — M25562 Pain in left knee: Secondary | ICD-10-CM | POA: Diagnosis present

## 2022-08-26 DIAGNOSIS — E1169 Type 2 diabetes mellitus with other specified complication: Secondary | ICD-10-CM | POA: Insufficient documentation

## 2022-08-26 DIAGNOSIS — M7989 Other specified soft tissue disorders: Secondary | ICD-10-CM | POA: Diagnosis not present

## 2022-08-26 MED ORDER — KETOROLAC TROMETHAMINE 15 MG/ML IJ SOLN
15.0000 mg | Freq: Once | INTRAMUSCULAR | Status: AC
  Administered 2022-08-26: 15 mg via INTRAMUSCULAR

## 2022-08-26 MED ORDER — KETOROLAC TROMETHAMINE 15 MG/ML IJ SOLN
15.0000 mg | Freq: Once | INTRAMUSCULAR | Status: DC
  Filled 2022-08-26: qty 1

## 2022-08-26 NOTE — ED Triage Notes (Signed)
Pt states pain and swelling in left knee. Pt states similar instance since [place] 2 months ago.

## 2022-08-27 NOTE — ED Provider Notes (Signed)
[place] EMERGENCY DEPT Provider Note  CSN: [id number] Arrival date & time: 08/26/22 1657  Chief Complaint(s) Knee Pain  HPI Debra Diaz is a 46 y.o. female with PMH DM p/w left knee pain. No trauma. Worse with walking. Symptoms mild. No numbness/tingling. Has had previously. No numbness/tingling. Began a couple days ago.    Patient declined interpreter  Past Medical History Past Medical History:  Diagnosis Date   Amenorrhea 02/18/2021   Anemia    Asymptomatic microscopic hematuria 10/22/2020   Atrial flutter ([place]) 02/16/2022   Blood transfusion without reported diagnosis    Breast pain, right 10/03/2017   Chest pain 02/15/2020   Chest pain with high risk of acute coronary syndrome 02/27/2015   Concussion 05/05/2016   Diabetes mellitus without complication (HCC)    Dizziness 04/22/2022   Elevated troponin I level 02/26/2015   Hyperlipidemia    Hypertension    Hypokalemia    Irregular menses 07/02/2017   Leukocytosis 10/03/2016   Morbid obesity ([place])    Possible exposure to STD 02/11/2020   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 Authorizing Provider  apixaban (ELIQUIS) 5 MG TABS tablet TAKE 1 TABLET(5 MG) BY MOUTH TWICE DAILY 02/23/22   Vickie Epley, MD  blood glucose meter kit and supplies KIT Dispense based on patient and insurance preference. Use up to four times daily as directed. (FOR ICD-9 250.00, 250.01). 06/03/21   Carollee Leitz, MD  Blood Pressure KIT Check blood pressure twice a day.  Dx code: I10 11/18/20   Carollee Leitz, MD  glucose blood (ACCU-CHEK GUIDE) test strip Use as instructed 12/26/21   Concepcion Living, MD  linaclotide [facility]) 290 MCG CAPS capsule Take 1 capsule (290 mcg total) by mouth daily before breakfast. 07/13/22   Vladimir Crofts, PA-C  losartan-hydrochlorothiazide (HYZAAR) 100-25 MG tablet Take 1 tablet by mouth daily. 07/11/22   Holley Bouche, MD  metFORMIN (GLUCOPHAGE-XR) 500 MG 24 hr tablet Take 2 tablets (1,000 mg total) by mouth in the morning and at bedtime. 04/20/22 04/15/23  Carollee Leitz, MD  Past Surgical History Past Surgical History:  Procedure Laterality Date   A-FLUTTER ABLATION N/A 02/16/2022   Procedure: A-FLUTTER ABLATION;  Surgeon: Vickie Epley, MD;  Location: [place] CV LAB;  Service: Cardiovascular;  Laterality: N/A;   CESAREAN SECTION  1999 and 2011   x 2   LEFT HEART CATHETERIZATION WITH CORONARY ANGIOGRAM N/A 03/02/2015   Procedure: LEFT HEART CATHETERIZATION WITH CORONARY ANGIOGRAM;  Surgeon: [name], MD;  Location: [facility] CATH LAB;  Service: Cardiovascular;  Laterality: N/A;   MINIMALLY INVASIVE EXCISION OF ATRIAL MYXOMA N/A 03/04/2015   Procedure: MINIMALLY INVASIVE RESECTION OF LEFT ATRIAL MYXOMA ( USING A BILAYER PATCH CLOSURE);  Surgeon: [name], MD;  Location: [place];  Service: Open Heart Surgery;  Laterality: N/A;   myxoma N/A     chest   TEE WITHOUT CARDIOVERSION N/A 03/01/2015   Procedure: TRANSESOPHAGEAL ECHOCARDIOGRAM (TEE);  Surgeon: [name], MD;  Location: [facility] ENDOSCOPY;  Service: Cardiovascular;  Laterality: N/A;   TEE WITHOUT CARDIOVERSION N/A 03/04/2015   Procedure: TRANSESOPHAGEAL ECHOCARDIOGRAM (TEE);  Surgeon: [name], MD;  Location: [place];  Service: Open Heart Surgery;  Laterality: N/A;   TUBAL LIGATION  2011   Family History Family History  Problem Relation Age of Onset   Hypertension Mother    Heart disease Mother    Diabetes Father    Breast cancer Maternal Grandmother    Stomach cancer Maternal Grandfather    Asthma Son    Colon cancer Neg Hx    Rectal cancer Neg Hx     Social History Social History   Tobacco Use   Smoking status: Former    Types: Cigarettes    Quit date: 11/17/2017    Years since quitting: 4.7    Passive exposure: Past   Smokeless tobacco: Never   Tobacco comments:    Former smoker 01/09/22  Vaping Use   Vaping Use: Never used  Substance Use Topics   Alcohol use: Yes    Comment: occasional   Drug use: No   Allergies Patient has no known allergies.  Review of Systems Review of Systems  All other systems reviewed and are negative.   Physical Exam Vital Signs  I have reviewed the triage vital signs BP 132/72 (BP Location: Right Arm)   Pulse (!) 45   Temp 98.3 F (36.8 C) (Oral)   Resp 20   Ht 5' 2"  (1.575 m)   Wt 95.3 kg   SpO2 99%   BMI 38.41 kg/m  Physical Exam Vitals and nursing note reviewed.  Constitutional:      General: She is not in acute distress.    Appearance: She is well-developed.  HENT:     Head: Normocephalic and atraumatic.     Mouth/Throat:     Mouth: Mucous membranes are moist.  Eyes:     Pupils: Pupils are equal, round, and reactive to light.  Cardiovascular:     Rate and Rhythm: Normal rate and regular rhythm.     Heart sounds: No murmur heard. Pulmonary:     Effort: Pulmonary effort is normal. No  respiratory distress.     Breath sounds: Normal breath sounds.  Abdominal:     General: Abdomen is flat.     Palpations: Abdomen is soft.     Tenderness: There is no abdominal tenderness.  Musculoskeletal:        General: No tenderness.     Right lower leg: No edema.     Left lower  leg: No edema.     Comments: Full active ROM of the left knee without tenderness, redness, warmth  Skin:    General: Skin is warm and dry.  Neurological:     General: No focal deficit present.     Mental Status: She is alert. Mental status is at baseline.  Psychiatric:        Mood and Affect: Mood normal.        Behavior: Behavior normal.     ED Results and Treatments Labs (all labs ordered are listed, but only abnormal results are displayed) Labs Reviewed - No data to display                                                                                                                        Radiology US Venous Img Lower Unilateral Left  Result Date: 08/26/2022 CLINICAL DATA:  Evaluate for DVT.  Swelling. EXAM: LEFT LOWER EXTREMITY VENOUS DOPPLER ULTRASOUND TECHNIQUE: Gray-scale sonography with compression, as well as color and duplex ultrasound, were performed to evaluate the deep venous system(s) from the level of the common femoral vein through the popliteal and proximal calf veins. COMPARISON:  None Available. FINDINGS: VENOUS Normal compressibility of the common femoral, superficial femoral, and popliteal veins, as well as the visualized calf veins. Visualized portions of profunda femoral vein and great saphenous vein unremarkable. No filling defects to suggest DVT on grayscale or color Doppler imaging. Doppler waveforms show normal direction of venous flow, normal respiratory plasticity and response to augmentation. Limited views of the contralateral common femoral vein are unremarkable. OTHER None. Limitations: none IMPRESSION: Negative. Electronically Signed   By: Ronney Asters M.D.   On: 08/26/2022  19:11   DG Knee Complete 4 Views Left  Result Date: 08/26/2022 CLINICAL DATA:  Fall. EXAM: LEFT KNEE - COMPLETE 4+ VIEW COMPARISON:  None Available. FINDINGS: No evidence of fracture, dislocation, or joint effusion. Joint spaces are maintained. Soft tissues are unremarkable. IMPRESSION: Negative. Electronically Signed   By: Ronney Asters M.D.   On: 08/26/2022 18:59    Pertinent labs & imaging results that were available during my care of the patient were reviewed by me and considered in my medical decision making (see MDM for details).  Medications Ordered in ED Medications  ketorolac (TORADOL) 15 MG/ML injection 15 mg (15 mg Intramuscular Given 08/26/22 2301)  Procedures Procedures  (including critical care time)  Medical Decision Making / ED Course   MDM:  46 y/o female presenting with knee pain.  Exam without obvious injury, swelling. No signs of septic joint or crystal arthropathy. No trauma and XR negative. DVT US negative and no signs of DVT. Advised f/u with PCP. Will discharge patient to home. All questions answered. Patient comfortable with plan of discharge. Return precautions discussed with patient and specified on the after visit summary.       Additional history obtained: -Additional history obtained from family -External records from outside source obtained and reviewed including: Chart review including previous notes, labs, imaging, consultation notes   Lab Tests: -I ordered, reviewed, and interpreted labs.   The pertinent results include:   Labs Reviewed - No data to display     Imaging Studies ordered: I ordered imaging studies including XR knee, DVT US LE On my interpretation imaging demonstrates no dvt, no fracture I independently visualized and interpreted imaging. I agree with the radiologist interpretation   Medicines  ordered and prescription drug management: Meds ordered this encounter  Medications   DISCONTD: ketorolac (TORADOL) 15 MG/ML injection 15 mg   ketorolac (TORADOL) 15 MG/ML injection 15 mg    -I have reviewed the patients home medicines and have made adjustments as needed   Reevaluation: After the interventions noted above, I reevaluated the patient and found that they have improved  Co morbidities that complicate the patient evaluation  Past Medical History:  Diagnosis Date   Amenorrhea 02/18/2021   Anemia    Asymptomatic microscopic hematuria 10/22/2020   Atrial flutter ([place]) 02/16/2022   Blood transfusion without reported diagnosis    Breast pain, right 10/03/2017   Chest pain 02/15/2020   Chest pain with high risk of acute coronary syndrome 02/27/2015   Concussion 05/05/2016   Diabetes mellitus without complication ([place])    Dizziness 04/22/2022   Elevated troponin I level 02/26/2015   Hyperlipidemia    Hypertension    Hypokalemia    Irregular menses 07/02/2017   Leukocytosis 10/03/2016   Morbid obesity ([place])    Possible exposure to STD 02/11/2020   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  Despite best efforts to proofread,  errors can occur which can change the documentation meaning.    Cristie Hem, MD 08/27/22 [id number]

## 2022-08-28 ENCOUNTER — Ambulatory Visit: Payer: Medicaid Other | Admitting: Family Medicine

## 2022-08-28 ENCOUNTER — Encounter: Payer: Self-pay | Admitting: Family Medicine

## 2022-08-28 VITALS — BP 130/86 | Ht 62.0 in | Wt 210.0 lb

## 2022-08-28 DIAGNOSIS — M25562 Pain in left knee: Secondary | ICD-10-CM

## 2022-08-28 NOTE — Patient Instructions (Signed)
Take tylenol as needed. Voltaren gel up to 4 times a day topically to the knee. We will go ahead with an MRI of your knee - follow up with me 1-2 days after this to go over results in a no charge visit.

## 2022-08-28 NOTE — Progress Notes (Unsigned)
  Debra Diaz - 46 y.o. female MRN [id number]  Date of birth: April 16, 1976    CHIEF COMPLAINT:       SUBJECTIVE:   HPI:  Medial meniscus tear with 6 wk PT, Voltaren and tylenol  Had been having a lot of pain on the medial/anterior side of the knee. Went to ED on Saturday and they thought it was arthritis. Is taking tylenol for pain and a pain reliever from [place], not gettting much relief from either. Patient appreciates knee pain is getting worse. Hurts when it's flexed to 90 degree and hanging at rest, but feels relief when extended. Pain is always there, but hurts more when doing activity. It has also been a little swollen where it hurts, and it pop's/clicks often.    ROS:     See HPI  PERTINENT  PMH / PSH FH / / SH:  Past Medical, Surgical, Social, and Family History Reviewed & Updated in the EMR.  Pertinent findings include:    OBJECTIVE: BP 130/86 (BP Location: Left Arm, Patient Position: Sitting)   Ht '5\' 2"'$  (1.575 m)   Wt 210 lb (95.3 kg)   BMI 38.41 kg/m   Physical Exam:  Vital signs are reviewed.  GEN: Alert and oriented, NAD Pulm: Breathing unlabored PSY: normal mood, congruent affect  MSK: Minimal swelling at medial anterior knee joint w/ TTP, no erythema, FROM, negative anterior/posterior/varus/vlagus test, positive Apley and Thessaly maneuvers, patient walking with slight limp, neurovascularly intact.  ASSESSMENT & PLAN:  1. Medial Meniscus Tear Patient complains of worsening pain since last visit, reporting swelling in knee and pain at rest and with activity. Special test suggestive of medial meniscus tear, that may be healing poorly. Will obtain further imaging. Will hold-off on NSAID given patient is on Eliquis.  -MRI Left Knee -Tylenol PRN   Holley Bouche, MD PGY-2, [facility] Resident [place]

## 2022-08-29 ENCOUNTER — Encounter: Payer: Self-pay | Admitting: Family Medicine

## 2022-09-06 ENCOUNTER — Ambulatory Visit
Admission: RE | Admit: 2022-09-06 | Discharge: 2022-09-06 | Disposition: A | Discharge status: Home / Self Care | Payer: Medicaid Other | Source: Ambulatory Visit | Attending: Family Medicine | Admitting: Family Medicine

## 2022-09-06 DIAGNOSIS — M25562 Pain in left knee: Secondary | ICD-10-CM

## 2022-09-08 ENCOUNTER — Encounter: Payer: Medicaid Other | Attending: Family Medicine | Admitting: Dietician

## 2022-09-08 DIAGNOSIS — R109 Unspecified abdominal pain: Secondary | ICD-10-CM | POA: Insufficient documentation

## 2022-09-08 NOTE — Progress Notes (Signed)
Medical Nutrition Therapy  Appointment Start time:  [phone]  Appointment End time:  0350  Pt is here today with interpretor Nicole Kindred.   Primary concerns today:  Pt wants to learn to eat well because she states her traditional [place] cuisine is high in carbohydrates and she feels as though it is not healthy. She also states she has been having some issues with abdominal bloating.   Referral diagnosis: postprandial abdominal bloating Preferred learning style: no preference indicated Learning readiness: ready   NUTRITION ASSESSMENT   Anthropometrics  Ht: 64in Wt: 205.8lbs  Clinical Medical Hx: anemia, blood transfusion, HLD, HTN, type 2 diabetes Medications: metformin Labs: A1C 7.3, pt labs on phone Notable Signs/Symptoms: abdominal bloating in evening SMBG postprandial: only checks her blood sugar when she is showing signs/symptoms of hyperglycemia and it is around 350 when this happens.  Lifestyle & Dietary Hx Food allergy: cherries gives her hives.  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 She will usually grab something easy like a piece of bread in the morning then goes to work. 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   Pt feels that in the evenings she gets very bloated and it feels hard and uncomfortable. She also states she tends to snack on chips in the evenings.   Estimated daily fluid intake: 16-32 oz Supplements: none Sleep: 5 hours, wakes up 2-4 times to use the restroom Stress / self-care: moderate stress Current average weekly physical activity: ADLs   24-Hr Dietary Recall First Meal: coffee and bread Snack: none Second Meal: 3pm: rice, beans, tortillas, salsa (occasionally) Snack: none Third Meal: 7pm: chicken soup OR tamales OR enchiladas Snack: chips/cheetos Beverages: coffee, water 16-32oz   NUTRITION DIAGNOSIS  NB-1.1 Food and  nutrition-related knowledge deficit As related to type 2 diabetes.  As evidenced by pt report and diet history.   NUTRITION INTERVENTION  Nutrition education (E-1) on the following topics:  Refined vs whole grains Importance of adequate hydration Insulin resistance Hyperglycemia symptoms Functions of fiber MyPlate Building balanced snacks Balance of carbohydrate, protein, and non-starchy vegetables.  Handouts Provided Include  MyPlate MyPlacemat for Diabetes  Learning Style & Readiness for Change Teaching method utilized: Visual & Auditory  Demonstrated degree of understanding via: Teach Back  Barriers to learning/adherence to lifestyle change: none  Goals Established by Pt Aim for 150 minutes of physical activity weekly. -consider walking after a meal  Aim to make 1/2 of your plate vegetables at least 2x/day.  Build a more balanced breakfast -Egg on whole wheat toast and a piece of fruit  Eat more Non-Starchy Vegetables  Minimize added sugars and refined grains Rethink what you drink.  Choose beverages without added sugar.  Look for 0 carbs on the label.  Choose whole foods over processed. Make simple meals at home more often than eating out.   MONITORING & EVALUATION Dietary intake, weekly physical activity, and follow up in 2 months.  Next Steps  Patient is to call for questions.

## 2022-09-08 NOTE — Patient Instructions (Addendum)
Aim for 150 minutes of physical activity weekly. -consider walking after a meal  Aim to make 1/2 of your plate vegetables at least 2x/day.  Build a more balanced breakfast -Egg on whole wheat toast and a piece of fruit  Eat more Non-Starchy Vegetables  Minimize added sugars and refined grains Rethink what you drink.  Choose beverages without added sugar.  Look for 0 carbs on the label.  Choose whole foods over processed. Make simple meals at home more often than eating out.

## 2022-09-20 ENCOUNTER — Ambulatory Visit: Payer: Medicaid Other | Admitting: Family Medicine

## 2022-09-25 ENCOUNTER — Ambulatory Visit: Payer: Medicaid Other | Admitting: Family Medicine

## 2022-09-25 VITALS — Ht 62.0 in | Wt 210.0 lb

## 2022-09-25 DIAGNOSIS — M25562 Pain in left knee: Secondary | ICD-10-CM

## 2022-09-25 MED ORDER — METHYLPREDNISOLONE ACETATE 40 MG/ML IJ SUSP
40.0000 mg | Freq: Once | INTRAMUSCULAR | Status: AC
Start: 2022-09-25 — End: 2022-09-25
  Administered 2022-09-25: 40 mg via INTRA_ARTICULAR

## 2022-09-25 NOTE — Progress Notes (Signed)
Patient seen today with interpreter.  We went over her MRI results - show both medial meniscus tear but also moderate arthropathy.  Discussed options - recommended trying intraarticular injection first, call us in a week to let us know how she's doing.  If not improving would recommend referral to ortho to discuss arthroscopy.  If pain predominantly from arthritic component, should help tremendously.  After informed written consent timeout was performed, patient was seated on exam table. Left knee was prepped with alcohol swab and utilizing anteromedial approach, patient's left knee was injected intraarticularly with 3:1 lidocaine: depomedrol. Patient tolerated the procedure well without immediate complications.

## 2022-10-01 ENCOUNTER — Other Ambulatory Visit: Payer: Self-pay | Admitting: Student

## 2022-11-07 ENCOUNTER — Encounter: Payer: Medicaid Other | Admitting: Dietician

## 2022-11-11 ENCOUNTER — Emergency Department (HOSPITAL_BASED_OUTPATIENT_CLINIC_OR_DEPARTMENT_OTHER)
Admission: EM | Admit: 2022-11-11 | Discharge: 2022-11-11 | Discharge status: Against Medical Advice | Payer: Medicaid Other | Attending: Emergency Medicine | Admitting: Emergency Medicine

## 2022-11-11 ENCOUNTER — Emergency Department (HOSPITAL_BASED_OUTPATIENT_CLINIC_OR_DEPARTMENT_OTHER): Payer: Medicaid Other | Admitting: Radiology

## 2022-11-11 ENCOUNTER — Encounter (HOSPITAL_BASED_OUTPATIENT_CLINIC_OR_DEPARTMENT_OTHER): Payer: Self-pay

## 2022-11-11 DIAGNOSIS — Z5321 Procedure and treatment not carried out due to patient leaving prior to being seen by health care provider: Secondary | ICD-10-CM | POA: Insufficient documentation

## 2022-11-11 DIAGNOSIS — R079 Chest pain, unspecified: Secondary | ICD-10-CM | POA: Diagnosis present

## 2022-11-11 LAB — CBC
HCT: 42.7 % (ref 36.0–46.0)
Hemoglobin: 14.2 g/dL (ref 12.0–15.0)
MCH: 29.6 pg (ref 26.0–34.0)
MCHC: 33.3 g/dL (ref 30.0–36.0)
MCV: 89 fL (ref 80.0–100.0)
Platelets: 261 10*3/uL (ref 150–400)
RBC: 4.8 MIL/uL (ref 3.87–5.11)
RDW: 13.3 % (ref 11.5–15.5)
WBC: 11.6 10*3/uL — ABNORMAL HIGH (ref 4.0–10.5)
nRBC: 0 % (ref 0.0–0.2)

## 2022-11-11 LAB — TROPONIN I (HIGH SENSITIVITY): Troponin I (High Sensitivity): 21 ng/L — ABNORMAL HIGH (ref <18)

## 2022-11-11 LAB — BASIC METABOLIC PANEL
Anion gap: 14 (ref 5–15)
BUN: 18 mg/dL (ref 6–20)
CO2: 24 mmol/L (ref 22–32)
Calcium: 9.3 mg/dL (ref 8.9–10.3)
Chloride: 100 mmol/L (ref 98–111)
Creatinine, Ser: 0.86 mg/dL (ref 0.44–1.00)
GFR, Estimated: >60 mL/min (ref >60)
Glucose, Bld: 204 mg/dL — ABNORMAL HIGH (ref 70–99)
Potassium: 3.3 mmol/L — ABNORMAL LOW (ref 3.5–5.1)
Sodium: 138 mmol/L (ref 135–145)

## 2022-11-11 NOTE — ED Notes (Signed)
This RN attempted to call pt for room, unable to locate pt, registration staff reports pt ambulated off unit and has been off unit x roughly 20 mins, did not tell staff they were leaving but has not came back inside
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
## 2022-11-21 ENCOUNTER — Encounter: Payer: Medicaid Other | Admitting: Dietician

## 2022-11-30 ENCOUNTER — Encounter: Payer: Medicaid Other | Admitting: Dietician

## 2023-01-19 ENCOUNTER — Ambulatory Visit (HOSPITAL_COMMUNITY): Payer: Medicaid Other | Attending: Family Medicine

## 2023-01-19 ENCOUNTER — Ambulatory Visit (INDEPENDENT_AMBULATORY_CARE_PROVIDER_SITE_OTHER): Payer: Medicaid Other | Admitting: Family Medicine

## 2023-01-19 VITALS — BP 129/75 | HR 43 | Ht 62.0 in | Wt 210.4 lb

## 2023-01-19 DIAGNOSIS — R0789 Other chest pain: Secondary | ICD-10-CM

## 2023-01-19 DIAGNOSIS — E1159 Type 2 diabetes mellitus with other circulatory complications: Secondary | ICD-10-CM | POA: Diagnosis present

## 2023-01-19 LAB — POCT GLYCOSYLATED HEMOGLOBIN (HGB A1C): HbA1c, POC (controlled diabetic range): 7.4 % — AB (ref 0.0–7.0)

## 2023-01-19 MED ORDER — BLOOD GLUCOSE MONITOR KIT: PACK | 0 refills | Status: DC

## 2023-01-19 MED ORDER — ACCU-CHEK GUIDE VI STRP: ORAL_STRIP | 1 refills | Status: DC

## 2023-01-19 MED ORDER — SEMAGLUTIDE(0.25 OR 0.5MG/DOS) 2 MG/1.5ML ~~LOC~~ SOPN: 0.2500 mg | PEN_INJECTOR | SUBCUTANEOUS | 0 refills | Status: DC

## 2023-01-19 NOTE — Patient Instructions (Addendum)
It was great seeing you today!  For your chest pain your EKG was unremarkable.  I do recommend calling cardiology to get in with them to update them on what you have been experiencing and ensure no change in medication is needed.   Your sugars are about the same as last time and close to goal at 7.4. I would like you to get below 7 which I think you will with the new medication. We are starting you on Ozempic which she will take once weekly on the same day.  Every month we will increase your dose to get to the max effective dose.  Common side effects are nausea and GI upset, which typically improves after you use the medicine.  If you are having bad symptoms please let me know.  I do want to see you in 2 weeks to check on how you are tolerating the medication, and increasing for the next dose.  Please check-out at the front desk before leaving the clinic. I'd like to see you back in 2-3 weeks but if you need to be seen earlier than that for any new issues we're happy to fit you in, just give [place] a call!  Visit Reminders: - Stop by the pharmacy to pick up your prescriptions  - Continue to work on your healthy eating habits and incorporating exercise into your daily life.   Feel free to call with any questions or concerns at any time, at [phone].   Take care,  Dr. Shary Key [facility]

## 2023-01-19 NOTE — Progress Notes (Signed)
    SUBJECTIVE:   CHIEF COMPLAINT / HPI:   Patient presents for diabetes follow up and complaints of chest pressure starting this morning. She states 2 years ago was given a monitor to check heart rhythm. Had a procedure done for her myxoma that now drops her heart rate. States when her heart is slow she feels tired Heart rate dips to the 30s.  Denies current chest pain.   Diabtes- takes metformin 2 in the morning and 2 at night. Sometimes only takes 2 a day. States she has been trying to eat better and exercise. Feeling a lot of stress. Does stress eat. She is open to a GLP 1 to further help in lowering her A1c and help with weight loss.    PERTINENT  PMH / PSH:  DM, HTN, LA myxoma s/p resection 2016, atrial flutter who presents for follow up in the [facility].   OBJECTIVE:   BP 129/75   Pulse (!) 43   Ht '5\' 2"'$  (1.575 m)   Wt 210 lb 6.4 oz (95.4 kg)   SpO2 100%   BMI 38.48 kg/m    Physical exam General: well appearing, NAD Cardiovascular: Bradycardic, no murmurs Lungs: CTAB. Normal WOB Abdomen: soft, non-distended, non-tender Skin: warm, dry. No edema  ASSESSMENT/PLAN:   Chest pressure Patient endorses intermittent chest pressure, last occurring this morning. Denies current pressure or pain. Performed EKG which should sinus bradycardia. She follows with Cardiology and advised her to follow up with them to further discuss the symptoms she has been experiencing. Return precautions discussed.   DM2 (diabetes mellitus, type 2) (HCC) A1c today 7.4 which is close to goal of <7. Currently on Metformin '1000mg'$  BID. Given her BMI of 38, heart disease, and room to lower A1c further patient would greatly benefit from a GLP-1. Will start Ozempic .'25mg'$  weekly and titrate monthly as tolerated. Will continue lifestyle changes of physical activity and healthy eating. Will follow up in 2 weeks after she starts the Ozempic to see how she tolerates it.    [address]

## 2023-01-22 MED ORDER — SEMAGLUTIDE(0.25 OR 0.5MG/DOS) 2 MG/1.5ML ~~LOC~~ SOPN: 0.2500 mg | PEN_INJECTOR | SUBCUTANEOUS | 0 refills | Status: DC

## 2023-01-22 NOTE — Assessment & Plan Note (Signed)
Patient endorses intermittent chest pressure, last occurring this morning. Denies current pressure or pain. Performed EKG which should sinus bradycardia. She follows with Cardiology and advised her to follow up with them to further discuss the symptoms she has been experiencing. Return precautions discussed.

## 2023-01-22 NOTE — Assessment & Plan Note (Signed)
A1c today 7.4 which is close to goal of <7. Currently on Metformin '1000mg'$  BID. Given her BMI of 38, heart disease, and room to lower A1c further patient would greatly benefit from a GLP-1. Will start Ozempic .'25mg'$  weekly and titrate monthly as tolerated. Will continue lifestyle changes of physical activity and healthy eating. Will follow up in 2 weeks after she starts the Ozempic to see how she tolerates it.

## 2023-01-23 ENCOUNTER — Telehealth: Payer: Self-pay | Admitting: Internal Medicine

## 2023-01-23 NOTE — Telephone Encounter (Signed)
   Pre-operative Risk Assessment    Patient Name: Debra Diaz  DOB: 10/06/1976 MRN: [id number]     Request for Surgical Clearance    Procedure:   Dental Cleaning   Date of Surgery:  Clearance 01/24/23                                 Surgeon:  Lenard Simmer DDS Surgeon's Group or Practice Name: [facility]  Phone number:  [phone] Fax number:  [phone]   Type of Clearance Requested:   - Medical    Type of Anesthesia:  None    Additional requests/questions:   Wants to know if premed is needed  Crist Infante   01/23/2023, 9:54 AM

## 2023-01-23 NOTE — Telephone Encounter (Signed)
   Patient Name: Debra Diaz  DOB: 1976/08/03 MRN: [id number]  Primary Cardiologist: Dorris Carnes, MD  Chart reviewed as part of pre-operative protocol coverage.   Simple dental extractions (i.e. 1-2 teeth), cleanings are considered low risk procedures per guidelines and generally do not require any specific cardiac clearance. It is also generally accepted that for simple extractions and dental cleanings, there is no need to interrupt blood thinner therapy.   SBE prophylaxis is not required for the patient from a cardiac standpoint.  I will route this recommendation to the requesting party via Epic fax function and remove from pre-op pool.  Please call with questions.  Lenna Sciara, NP 01/23/2023, 10:02 AM

## 2023-01-30 ENCOUNTER — Ambulatory Visit: Payer: Medicaid Other

## 2023-02-09 ENCOUNTER — Other Ambulatory Visit: Payer: Self-pay | Admitting: Family Medicine

## 2023-02-19 ENCOUNTER — Encounter: Payer: Self-pay | Admitting: Family Medicine

## 2023-02-19 ENCOUNTER — Other Ambulatory Visit: Payer: Self-pay

## 2023-02-19 ENCOUNTER — Ambulatory Visit (INDEPENDENT_AMBULATORY_CARE_PROVIDER_SITE_OTHER): Payer: Medicaid Other | Admitting: Family Medicine

## 2023-02-19 DIAGNOSIS — I10 Essential (primary) hypertension: Secondary | ICD-10-CM

## 2023-02-19 MED ORDER — SEMAGLUTIDE(0.25 OR 0.5MG/DOS) 2 MG/1.5ML ~~LOC~~ SOPN: 0.5000 mg | PEN_INJECTOR | SUBCUTANEOUS | 0 refills | Status: DC

## 2023-02-19 NOTE — Patient Instructions (Signed)
It was great seeing you today!  I am glad Ozempic is working well for you! Today we gave you another dose. I have refilled it and after this current pen you will increase the dose to .'5mg'$  weekly.  Continue taking your blood pressure medication. We will check again at your follow up appointment. Take your measurements at home for a few days in the morning like we discussed.   Visit Reminders: - Stop by the pharmacy to pick up your prescriptions  - Continue to work on your healthy eating habits and incorporating exercise into your daily life.   Feel free to call with any questions or concerns at any time, at [phone].   Take care,  Dr. Shary Key [facility]

## 2023-02-19 NOTE — Assessment & Plan Note (Signed)
BMI 38.23. Started on Ozempic at last visit, first dose on 2/24. Gave second dose at visit today and instructed her on how to do it moving forward. Increased dose for next refill to .'5mg'$  weekly. Also discussed healthy eating and getting in physical activity when able.

## 2023-02-19 NOTE — Assessment & Plan Note (Signed)
BP initially very elevated but on repeat 139/75. She did not take her BP medication today yet. Typically takes Losartan-HCTZ 100-25. Recommended checking BP at home and at follow up in a couple weeks will review and see if we need to adjust medication further. Patient in agreement with plan.

## 2023-02-19 NOTE — Progress Notes (Signed)
    SUBJECTIVE:   CHIEF COMPLAINT / HPI:   Patient presents for follow up appointment. She was seen 1 month ago and was started on Ozempic. Today she requests for me to give her the dose and help he with it. Last dose was Sat 2/24. Tolerating it well no nausea, diarrhea or other side effects. Feels that her appetite has decreased since taking the medication. States exercise is hard being a single mom of 5 kids. 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   BP elevated on arrival. States she did not take her medication today but typically takes it daily. Denies CP, SOB, headache.   PERTINENT  PMH / PSH: Reviewed   OBJECTIVE:   BP 139/75   Pulse (!) 50   Wt 209 lb (94.8 kg)   SpO2 100%   BMI 38.23 kg/m    Physical exam General: well appearing, NAD Cardiovascular: RRR, no murmurs Lungs: CTAB. Normal WOB Abdomen: soft, non-distended, non-tender Skin: warm, dry. No edema  ASSESSMENT/PLAN:   Morbid obesity BMI 38.23. Started on Ozempic at last visit, first dose on 2/24. Gave second dose at visit today and instructed her on how to do it moving forward. Increased dose for next refill to .'5mg'$  weekly. Also discussed healthy eating and getting in physical activity when able.  HTN (hypertension) BP initially very elevated but on repeat 139/75. She did not take her BP medication today yet. Typically takes Losartan-HCTZ 100-25. Recommended checking BP at home and at follow up in a couple weeks will review and see if we need to adjust medication further. Patient in agreement with plan.    [place]

## 2023-02-26 ENCOUNTER — Other Ambulatory Visit (HOSPITAL_COMMUNITY): Payer: Self-pay

## 2023-02-26 ENCOUNTER — Telehealth: Payer: Self-pay

## 2023-02-26 NOTE — Telephone Encounter (Signed)
A Prior Authorization was initiated for this patients OZEMPIC 0.'5MG'$  DOSE PENS through CoverMyMeds.   Key: [id number]

## 2023-02-28 NOTE — Telephone Encounter (Signed)
Prior Auth for patients medication OZEMPIC 0.25/0.'5MG'$  approved by [employer] from 02/26/23 to 02/26/24.  CoverMyMeds Key: [id number]

## 2023-03-05 ENCOUNTER — Encounter: Payer: Self-pay | Admitting: Family Medicine

## 2023-03-05 ENCOUNTER — Ambulatory Visit (INDEPENDENT_AMBULATORY_CARE_PROVIDER_SITE_OTHER): Payer: Medicaid Other | Admitting: Family Medicine

## 2023-03-05 VITALS — BP 118/74 | HR 73 | Ht 62.0 in | Wt 207.2 lb

## 2023-03-05 DIAGNOSIS — L720 Epidermal cyst: Secondary | ICD-10-CM | POA: Diagnosis present

## 2023-03-05 DIAGNOSIS — E1159 Type 2 diabetes mellitus with other circulatory complications: Secondary | ICD-10-CM | POA: Diagnosis not present

## 2023-03-05 DIAGNOSIS — I10 Essential (primary) hypertension: Secondary | ICD-10-CM | POA: Diagnosis not present

## 2023-03-05 MED ORDER — SEMAGLUTIDE(0.25 OR 0.5MG/DOS) 2 MG/1.5ML ~~LOC~~ SOPN: 0.5000 mg | PEN_INJECTOR | SUBCUTANEOUS | 0 refills | Status: DC

## 2023-03-05 NOTE — Progress Notes (Signed)
    SUBJECTIVE:   CHIEF COMPLAINT / HPI:   Patient presents to discuss blood pressure.  She was last seen on 3/6 with elevated blood pressure improved on repeat check.  States she has been taking her medication(Losartan- HCTZ 100-25) daily. She also endorses walking more. Feels well.   Additionally states about 2 weeks ago got a splinter and tried to remove it but feels it got pusehd down more under her skin. Endorses a throbbing pain. Denies fever, chills at home. Has not tried anything for it. Not currently interested in draining it.   States the pharmacy didn't give her the Ozempic, seems to be out of it. Would like printed prescription   PERTINENT  PMH / PSH: Reviewed   OBJECTIVE:   BP 118/74   Pulse 73   Ht 5\' 2"  (1.575 m)   Wt 207 lb 3.2 oz (94 kg)   SpO2 98%   BMI 37.90 kg/m    Physical exam General: well appearing, NAD Cardiovascular: RRR, no murmurs Lungs: CTAB. Normal WOB Abdomen: soft, non-distended, non-tender Skin: warm, dry. Fluctuant lesion on on R third digit near PIP nonerythematous. Mildly tender to palpation     ASSESSMENT/PLAN:   HTN (hypertension) Blood pressure today stable at 118/74.  Currently taking Losartan- HCTZ 100-25 daily, Will not make any adjustments at this time.   Epidermoid cyst of finger Small fluctuant cyst on 3rd R digit near PIP for the past 2 weeks. Mildly tender to palpation and without erythema or drainage. No systemic signs of infection. Discussed referral to hand surgeon for removal but she would like to wait to see if it resolves on its own first. Recommended trying frequent warm compresses. Advised her to let me know if no improvement over the next couple of weeks and I can place referral.   DM2 Printed Rx for Ozempic .5mg  weekly  [place]

## 2023-03-05 NOTE — Patient Instructions (Addendum)
It was great seeing you today!  For your finger I recommend using warm towel or warm compress to help bring down the swelling a few times a day.  If it does not go away on its own please give me a call and I can place a referral to get it drained.   I have printed out your Ozempic prescription so that you can take it to the pharmacy.  Feel free to call with any questions or concerns at any time, at [phone].   Take care,  Dr. Shary Key [facility]   Fue genial verte hoy!  Para el dedo, recomiendo usar una toalla tibia o una compresa tibia para ayudar a reducir la hinchazn varias veces al da. Si no desaparece por s solo, llmeme y puedo hacer una referencia para drenarlo.  He impreso tu receta de Ozempic para que puedas llevarla a la farmacia.  Si tiene alguna pregunta o inquietud, no dude en llamarnos en cualquier momento al [phone].   Cuidarse, Dra[address] [place] familiar [place]

## 2023-03-06 DIAGNOSIS — L720 Epidermal cyst: Secondary | ICD-10-CM | POA: Insufficient documentation

## 2023-03-06 LAB — MICROALBUMIN / CREATININE URINE RATIO
Creatinine, Urine: 71.6 mg/dL
Microalb/Creat Ratio: 6 mg/g creat (ref 0–29)
Microalbumin, Urine: 4.2 ug/mL

## 2023-03-06 NOTE — Assessment & Plan Note (Signed)
Small fluctuant cyst on 3rd R digit near PIP for the past 2 weeks. Mildly tender to palpation and without erythema or drainage. No systemic signs of infection. Discussed referral to hand surgeon for removal but she would like to wait to see if it resolves on its own first. Recommended trying frequent warm compresses. Advised her to let me know if no improvement over the next couple of weeks and I can place referral.

## 2023-03-06 NOTE — Assessment & Plan Note (Signed)
Blood pressure today stable at 118/74.  Currently taking Losartan- HCTZ 100-25 daily, Will not make any adjustments at this time.

## 2023-03-07 ENCOUNTER — Other Ambulatory Visit: Payer: Self-pay | Admitting: Family Medicine

## 2023-03-07 ENCOUNTER — Other Ambulatory Visit: Payer: Self-pay | Admitting: Cardiology

## 2023-03-07 DIAGNOSIS — I4892 Unspecified atrial flutter: Secondary | ICD-10-CM

## 2023-03-07 NOTE — Telephone Encounter (Signed)
Prescription refill request for Eliquis received. Indication: Aflutter Last office visit: 03/24/22 Quentin Ore)  Scr: 0.86 (07/15/22)  Age: 47 Weight: 94kg  Appropriate dose. Refill sent.

## 2023-03-08 ENCOUNTER — Other Ambulatory Visit: Payer: Self-pay | Admitting: Family Medicine

## 2023-04-19 ENCOUNTER — Ambulatory Visit: Payer: Medicaid Other | Attending: Student

## 2023-04-19 ENCOUNTER — Encounter: Payer: Self-pay | Admitting: Student

## 2023-04-19 ENCOUNTER — Ambulatory Visit: Payer: Medicaid Other | Attending: Student | Admitting: Student

## 2023-04-19 VITALS — BP 132/72 | HR 53 | Ht 61.0 in | Wt 210.0 lb

## 2023-04-19 DIAGNOSIS — R001 Bradycardia, unspecified: Secondary | ICD-10-CM

## 2023-04-19 DIAGNOSIS — I10 Essential (primary) hypertension: Secondary | ICD-10-CM | POA: Diagnosis not present

## 2023-04-19 DIAGNOSIS — I484 Atypical atrial flutter: Secondary | ICD-10-CM

## 2023-04-19 NOTE — Patient Instructions (Signed)
Medication Instructions:  Your physician recommends that you continue on your current medications as directed. Please refer to the Current Medication list given to you today.  *If you need a refill on your cardiac medications before your next appointment, please call your pharmacy*  Lab Work: None ordered If you have labs (blood work) drawn today and your tests are completely normal, you will receive your results only by: MyChart Message (if you have MyChart) OR A paper copy in the mail If you have any lab test that is abnormal or we need to change your treatment, we will call you to review the results.   Testing/Procedures: Christena Deem- Long Term Monitor Instructions  Your physician has requested you wear a ZIO patch monitor for 14 days.  This is a single patch monitor. [employer] supplies one patch monitor per enrollment. Additional stickers are not available. Please do not apply patch if you will be having a Nuclear Stress Test,  Echocardiogram, Cardiac CT, MRI, or Chest Xray during the period you would be wearing the  monitor. The patch cannot be worn during these tests. You cannot remove and re-apply the  ZIO XT patch monitor.  Your ZIO patch monitor will be mailed 3 day USPS to your address on file. It may take 3-5 days  to receive your monitor after you have been enrolled.  Once you have received your monitor, please review the enclosed instructions. Your monitor  has already been registered assigning a specific monitor serial # to you.  Billing and Patient Assistance Program Information  We have supplied [employer] with any of your insurance information on file for billing purposes. [employer] offers a sliding scale Patient Assistance Program for patients that do not have  insurance, or whose insurance does not completely cover the cost of the ZIO monitor.  You must apply for the Patient Assistance Program to qualify for this discounted rate.  To apply, please call [employer] at [phone],  select option 4, select option 2, ask to apply for  Patient Assistance Program. Meredeth Ide will ask your household income, and how many people  are in your household. They will quote your out-of-pocket cost based on that information.  [employer] will also be able to set up a 12-month, interest-free payment plan if needed.  Applying the monitor   Shave hair from upper left chest.  Hold abrader disc by orange tab. Rub abrader in 40 strokes over the upper left chest as  indicated in your monitor instructions.  Clean area with 4 enclosed alcohol pads. Let dry.  Apply patch as indicated in monitor instructions. Patch will be placed under collarbone on left  side of chest with arrow pointing upward.  Rub patch adhesive wings for 2 minutes. Remove white label marked "1". Remove the white  label marked "2". Rub patch adhesive wings for 2 additional minutes.  While looking in a mirror, press and release button in center of patch. A small green light will  flash 3-4 times. This will be your only indicator that the monitor has been turned on.  Do not shower for the first 24 hours. You may shower after the first 24 hours.  Press the button if you feel a symptom. You will hear a small click. Record Date, Time and  Symptom in the Patient Logbook.  When you are ready to remove the patch, follow instructions on the last 2 pages of Patient  Logbook. Stick patch monitor onto the last page of Patient Logbook.  Place Patient Logbook in  the blue and white box. Use locking tab on box and tape box closed  securely. The blue and white box has prepaid postage on it. Please place it in the mailbox as  soon as possible. Your physician should have your test results approximately 7 days after the  monitor has been mailed back to [facility].  Call [facility] Customer Care at [phone] if you have questions regarding  your ZIO XT patch monitor. Call them immediately if you see an orange light blinking on your   monitor.  If your monitor falls off in less than 4 days, contact our Monitor department at [phone].  If your monitor becomes loose or falls off after 4 days call [employer] at [phone] for  suggestions on securing your monitor    Follow-Up: At [facility], you and your health needs are our priority.  As part of our continuing mission to provide you with exceptional heart care, we have created designated Provider Care Teams.  These Care Teams include your primary Cardiologist (physician) and Advanced Practice Providers (APPs -  Physician Assistants and Nurse Practitioners) who all work together to provide you with the care you need, when you need it.  Your next appointment:   4 week(s)  Provider:   Casimiro Needle "Otilio Saber, PA-C

## 2023-04-19 NOTE — Progress Notes (Unsigned)
ZIO XT [id number] from office inventory applied to patient.
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
## 2023-05-09 ENCOUNTER — Emergency Department (HOSPITAL_COMMUNITY): Payer: Medicaid Other

## 2023-05-09 ENCOUNTER — Other Ambulatory Visit: Payer: Self-pay

## 2023-05-09 ENCOUNTER — Inpatient Hospital Stay (HOSPITAL_COMMUNITY)
Admission: EM | Admit: 2023-05-09 | Discharge: 2023-05-14 | DRG: 418 | Disposition: A | Discharge status: Home / Self Care | Payer: Medicaid Other | Attending: Family Medicine | Admitting: Family Medicine

## 2023-05-09 DIAGNOSIS — I4892 Unspecified atrial flutter: Secondary | ICD-10-CM | POA: Diagnosis present

## 2023-05-09 DIAGNOSIS — Z833 Family history of diabetes mellitus: Secondary | ICD-10-CM

## 2023-05-09 DIAGNOSIS — Z7984 Long term (current) use of oral hypoglycemic drugs: Secondary | ICD-10-CM

## 2023-05-09 DIAGNOSIS — Z79899 Other long term (current) drug therapy: Secondary | ICD-10-CM

## 2023-05-09 DIAGNOSIS — R079 Chest pain, unspecified: Secondary | ICD-10-CM | POA: Diagnosis present

## 2023-05-09 DIAGNOSIS — Z87891 Personal history of nicotine dependence: Secondary | ICD-10-CM

## 2023-05-09 DIAGNOSIS — R7989 Other specified abnormal findings of blood chemistry: Secondary | ICD-10-CM | POA: Diagnosis present

## 2023-05-09 DIAGNOSIS — E785 Hyperlipidemia, unspecified: Secondary | ICD-10-CM | POA: Diagnosis present

## 2023-05-09 DIAGNOSIS — Z7985 Long-term (current) use of injectable non-insulin antidiabetic drugs: Secondary | ICD-10-CM

## 2023-05-09 DIAGNOSIS — K8010 Calculus of gallbladder with chronic cholecystitis without obstruction: Secondary | ICD-10-CM | POA: Diagnosis present

## 2023-05-09 DIAGNOSIS — Z609 Problem related to social environment, unspecified: Secondary | ICD-10-CM

## 2023-05-09 DIAGNOSIS — Z7901 Long term (current) use of anticoagulants: Secondary | ICD-10-CM

## 2023-05-09 DIAGNOSIS — Z6837 Body mass index (BMI) 37.0-37.9, adult: Secondary | ICD-10-CM

## 2023-05-09 DIAGNOSIS — E119 Type 2 diabetes mellitus without complications: Secondary | ICD-10-CM | POA: Diagnosis present

## 2023-05-09 DIAGNOSIS — Z8249 Family history of ischemic heart disease and other diseases of the circulatory system: Secondary | ICD-10-CM

## 2023-05-09 DIAGNOSIS — K8020 Calculus of gallbladder without cholecystitis without obstruction: Secondary | ICD-10-CM

## 2023-05-09 DIAGNOSIS — D1803 Hemangioma of intra-abdominal structures: Secondary | ICD-10-CM | POA: Diagnosis present

## 2023-05-09 DIAGNOSIS — K851 Biliary acute pancreatitis without necrosis or infection: Secondary | ICD-10-CM | POA: Diagnosis present

## 2023-05-09 DIAGNOSIS — R001 Bradycardia, unspecified: Secondary | ICD-10-CM | POA: Diagnosis present

## 2023-05-09 DIAGNOSIS — K8689 Other specified diseases of pancreas: Secondary | ICD-10-CM | POA: Diagnosis present

## 2023-05-09 DIAGNOSIS — K859 Acute pancreatitis without necrosis or infection, unspecified: Secondary | ICD-10-CM | POA: Diagnosis present

## 2023-05-09 DIAGNOSIS — I10 Essential (primary) hypertension: Secondary | ICD-10-CM | POA: Diagnosis present

## 2023-05-09 DIAGNOSIS — Z825 Family history of asthma and other chronic lower respiratory diseases: Secondary | ICD-10-CM

## 2023-05-09 LAB — COMPREHENSIVE METABOLIC PANEL
ALT: 111 U/L — ABNORMAL HIGH (ref 0–44)
AST: 228 U/L — ABNORMAL HIGH (ref 15–41)
Albumin: 3.3 g/dL — ABNORMAL LOW (ref 3.5–5.0)
Alkaline Phosphatase: 126 U/L (ref 38–126)
Anion gap: 10 (ref 5–15)
BUN: 20 mg/dL (ref 6–20)
CO2: 22 mmol/L (ref 22–32)
Calcium: 8.1 mg/dL — ABNORMAL LOW (ref 8.9–10.3)
Chloride: 103 mmol/L (ref 98–111)
Creatinine, Ser: 0.73 mg/dL (ref 0.44–1.00)
GFR, Estimated: >60 mL/min (ref >60)
Glucose, Bld: 218 mg/dL — ABNORMAL HIGH (ref 70–99)
Potassium: 3.6 mmol/L (ref 3.5–5.1)
Sodium: 135 mmol/L (ref 135–145)
Total Bilirubin: 1.2 mg/dL (ref 0.3–1.2)
Total Protein: 6.2 g/dL — ABNORMAL LOW (ref 6.5–8.1)

## 2023-05-09 LAB — CBC WITH DIFFERENTIAL/PLATELET
Abs Immature Granulocytes: 0.16 10*3/uL — ABNORMAL HIGH (ref 0.00–0.07)
Basophils Absolute: 0.1 10*3/uL (ref 0.0–0.1)
Basophils Relative: 0 %
Eosinophils Absolute: 0 10*3/uL (ref 0.0–0.5)
Eosinophils Relative: 0 %
HCT: 42.3 % (ref 36.0–46.0)
Hemoglobin: 13.4 g/dL (ref 12.0–15.0)
Immature Granulocytes: 1 %
Lymphocytes Relative: 5 %
Lymphs Abs: 1.3 10*3/uL (ref 0.7–4.0)
MCH: 28.3 pg (ref 26.0–34.0)
MCHC: 31.7 g/dL (ref 30.0–36.0)
MCV: 89.2 fL (ref 80.0–100.0)
Monocytes Absolute: 1.5 10*3/uL — ABNORMAL HIGH (ref 0.1–1.0)
Monocytes Relative: 6 %
Neutro Abs: 21.4 10*3/uL — ABNORMAL HIGH (ref 1.7–7.7)
Neutrophils Relative %: 88 %
Platelets: 276 10*3/uL (ref 150–400)
RBC: 4.74 MIL/uL (ref 3.87–5.11)
RDW: 13.4 % (ref 11.5–15.5)
WBC: 24.3 10*3/uL — ABNORMAL HIGH (ref 4.0–10.5)
nRBC: 0 % (ref 0.0–0.2)

## 2023-05-09 LAB — LIPASE, BLOOD: Lipase: 3986 U/L — ABNORMAL HIGH (ref 11–51)

## 2023-05-09 LAB — TROPONIN I (HIGH SENSITIVITY)
Troponin I (High Sensitivity): 11 ng/L (ref <18)
Troponin I (High Sensitivity): 12 ng/L (ref <18)

## 2023-05-09 LAB — I-STAT BETA HCG BLOOD, ED (MC, WL, AP ONLY): I-stat hCG, quantitative: 13 m[IU]/mL — ABNORMAL HIGH (ref <5)

## 2023-05-09 LAB — LACTIC ACID, PLASMA
Lactic Acid, Venous: 2.1 mmol/L (ref 0.5–1.9)
Lactic Acid, Venous: 2.8 mmol/L (ref 0.5–1.9)

## 2023-05-09 LAB — TRIGLYCERIDES: Triglycerides: 55 mg/dL (ref <150)

## 2023-05-09 LAB — HCG, QUANTITATIVE, PREGNANCY: hCG, Beta Chain, Quant, S: 2 m[IU]/mL (ref <5)

## 2023-05-09 MED ORDER — LACTATED RINGERS IV BOLUS
1000.0000 mL | Freq: Once | INTRAVENOUS | Status: AC
  Administered 2023-05-10: 1000 mL via INTRAVENOUS

## 2023-05-09 MED ORDER — VANCOMYCIN HCL 10 G IV SOLR
1750.0000 mg | Freq: Once | INTRAVENOUS | Status: AC
  Administered 2023-05-10: 1750 mg via INTRAVENOUS
  Filled 2023-05-09: qty 17.5

## 2023-05-09 MED ORDER — PIPERACILLIN-TAZOBACTAM 3.375 G IVPB
3.3750 g | Freq: Three times a day (TID) | INTRAVENOUS | Status: DC
  Administered 2023-05-10: 3.375 g via INTRAVENOUS
  Filled 2023-05-09: qty 50

## 2023-05-09 MED ORDER — HYDROMORPHONE HCL 1 MG/ML IJ SOLN
1.0000 mg | Freq: Once | INTRAMUSCULAR | Status: AC
  Administered 2023-05-09: 1 mg via INTRAVENOUS
  Filled 2023-05-09: qty 1

## 2023-05-09 MED ORDER — FENTANYL CITRATE PF 50 MCG/ML IJ SOSY: 50.0000 ug | PREFILLED_SYRINGE | Freq: Once | INTRAMUSCULAR | Status: DC

## 2023-05-09 MED ORDER — FENTANYL CITRATE PF 50 MCG/ML IJ SOSY
50.0000 ug | PREFILLED_SYRINGE | Freq: Once | INTRAMUSCULAR | Status: AC
  Administered 2023-05-09: 50 ug via INTRAVENOUS
  Filled 2023-05-09: qty 1

## 2023-05-09 MED ORDER — IOHEXOL 350 MG/ML SOLN
75.0000 mL | Freq: Once | INTRAVENOUS | Status: AC | PRN
  Administered 2023-05-09: 75 mL via INTRAVENOUS

## 2023-05-09 MED ORDER — VANCOMYCIN HCL IN DEXTROSE 750-5 MG/150ML-% IV SOLN
750.0000 mg | Freq: Two times a day (BID) | INTRAVENOUS | Status: DC
  Filled 2023-05-09 (×4): qty 150

## 2023-05-09 MED ORDER — PIPERACILLIN-TAZOBACTAM 3.375 G IVPB 30 MIN
3.3750 g | Freq: Once | INTRAVENOUS | Status: AC
  Administered 2023-05-09: 3.375 g via INTRAVENOUS
  Filled 2023-05-09: qty 50

## 2023-05-09 NOTE — ED Notes (Signed)
Activated a code medical per consult by dr Karene Fry the nurse is made aware. Code medical was activated in [facility]

## 2023-05-09 NOTE — Progress Notes (Signed)
Pharmacy Antibiotic Note  Debra Diaz is a 47 y.o. female admitted on 05/09/2023 with  ascending cholangitis .  Pharmacy has been consulted for vancomycin and Zosyn dosing.  Patient presented with. CT 5/22 showed acute uncomplicated pancreatitis with no fluid collection, pseudocyst, or abscess.   Lactate 2.1, lipase 3,986, LFTs elevated, Scr at baseline, WBC 24.3, afebrile.  Plan: Give Zosyn 3.375 g 4 hour infusion every 8 hours Give vancomycin 1750 mg once, followed by 750 mg every 12 hours for [facility] of 498.8 Follow clinical progress and cultures to guide de escalation. Given infection location, can consider stopping vancomycin   Height: 5\' 3"  (160 cm) Weight: 94.8 kg (209 lb) IBW/kg (Calculated) : 52.4  Temp (24hrs), Avg:97.8 F (36.6 C), Min:97.8 F (36.6 C), Max:97.8 F (36.6 C)  Recent Labs  Lab 05/09/23 2000  WBC 24.3*  CREATININE 0.73  LATICACIDVEN 2.1*    Estimated Creatinine Clearance: 94.2 mL/min (by C-G formula based on SCr of 0.73 mg/dL).    Allergies  Allergen Reactions   Cherry Hives    Antimicrobials this admission: Zosyn 5/22 >>    Vancomycin 5/22 >>    Microbiology results: pending  Thank you for involving pharmacy in this patient's care.  Enos Fling, PharmD PGY2 Pharmacy Resident 05/09/2023 10:52 PM

## 2023-05-09 NOTE — ED Provider Notes (Signed)
[place] EMERGENCY DEPARTMENT AT [facility] Provider Note   CSN: [id number] Arrival date & time: 05/09/23  1850     History {Add pertinent medical, surgical, social history, OB history to HPI:1} Chief Complaint  Patient presents with   Abdominal Pain   Chest Pain    1430 abdominal pain started. 1730 chest pain started    Debra Diaz is a 47 y.o. female.   Abdominal Pain Associated symptoms: chest pain   Chest Pain Associated symptoms: abdominal pain        Home Medications Prior to Admission medications   Medication Sig Start Date End Date Taking? Authorizing Provider  Accu-Chek Softclix Lancets lancets USE TO CHECK BLOOD SUGAR UP TO FOUR TIMES DAILY 02/10/23   Bess Kinds, MD  apixaban (ELIQUIS) 5 MG TABS tablet TAKE 1 TABLET(5 MG) BY MOUTH TWICE DAILY 03/07/23   Lanier Prude, MD  blood glucose meter kit and supplies KIT Dispense based on patient and insurance preference. Use up to four times daily as directed. (FOR ICD-9 250.00, 250.01). 01/19/23   Cora Collum, DO  Blood Pressure KIT Check blood pressure twice a day.  Dx code: I10 11/18/20   Dana Allan, MD  glucose blood (ACCU-CHEK GUIDE) test strip USE TO CHECK BLOOD SUGAR UP TO FOUR TIMES DAILY 02/10/23   Bess Kinds, MD  linaclotide [facility]) 290 MCG CAPS capsule Take 1 capsule (290 mcg total) by mouth daily before breakfast. 07/13/22   Doree Albee, PA-C  losartan-hydrochlorothiazide (HYZAAR) 100-25 MG tablet TAKE 1 TABLET BY MOUTH DAILY 10/04/22   Bess Kinds, MD  metFORMIN (GLUCOPHAGE-XR) 500 MG 24 hr tablet TAKE 2 TABLETS(1000 MG) BY MOUTH IN THE MORNING AND AT BEDTIME 03/08/23   Bess Kinds, MD  Semaglutide,0.25 or 0.5MG /DOS, 2 MG/1.5ML SOPN Inject 0.5 mg into the skin once a week. 0.50 mg once weekly for 4 weeks 03/05/23   Cora Collum, DO      Allergies    Cherry    Review of Systems   Review of Systems  Cardiovascular:  Positive for chest pain.   Gastrointestinal:  Positive for abdominal pain.    Physical Exam Updated Vital Signs BP (!) 104/55   Pulse (!) 37   Temp 97.8 F (36.6 C) (Oral)   Resp (!) 27   Ht 5\' 3"  (1.6 m)   Wt 94.8 kg   SpO2 100%   BMI 37.02 kg/m  Physical Exam  ED Results / Procedures / Treatments   Labs (all labs ordered are listed, but only abnormal results are displayed) Labs Reviewed  CBC WITH DIFFERENTIAL/PLATELET - Abnormal; Notable for the following components:      Result Value   WBC 24.3 (*)    Neutro Abs 21.4 (*)    Monocytes Absolute 1.5 (*)    Abs Immature Granulocytes 0.16 (*)    All other components within normal limits  COMPREHENSIVE METABOLIC PANEL - Abnormal; Notable for the following components:   Glucose, Bld 218 (*)    Calcium 8.1 (*)    Total Protein 6.2 (*)    Albumin 3.3 (*)    AST 228 (*)    ALT 111 (*)    All other components within normal limits  LACTIC ACID, PLASMA - Abnormal; Notable for the following components:   Lactic Acid, Venous 2.1 (*)    All other components within normal limits  I-STAT BETA HCG BLOOD, ED ([facility], [facility], [facility] ONLY) - Abnormal; Notable for the following components:  I-stat hCG, quantitative 13.0 (*)    All other components within normal limits  LIPASE, BLOOD  LACTIC ACID, PLASMA  HCG, QUANTITATIVE, PREGNANCY  TROPONIN I (HIGH SENSITIVITY)    EKG None  Radiology DG Chest Portable 1 View  Result Date: 05/09/2023 CLINICAL DATA:  Chest and epigastric pain EXAM: PORTABLE CHEST 1 VIEW COMPARISON:  11/11/2022 FINDINGS: Low lung volumes are present, causing crowding of the pulmonary vasculature. Heart size within normal limits. The lungs appear clear. No blunting of the costophrenic angles. No significant bony abnormality observed. IMPRESSION: 1. No active cardiopulmonary disease is radiographically apparent. 2. Low lung volumes. Electronically Signed   By: Gaylyn Rong M.D.   On: 05/09/2023 20:11   LONG TERM MONITOR (3-14 DAYS)  Result  Date: 05/09/2023 HR 32 - 138 bpm, average 54 bpm. 1 pause lasting 3.3 seconds. Rare supraventricular and ventricular ectopy. No sustained arrhythmias. No atrial fibrillation. Symptom trigger episodes correspond to sinus rhythm with PVC's. Sheria Lang T. Lalla Brothers, MD, [facility], [facility] Cardiac Electrophysiology    Procedures Procedures  {Document cardiac monitor, telemetry assessment procedure when appropriate:1}  Medications Ordered in ED Medications - No data to display  ED Course/ Medical Decision Making/ A&P Clinical Course as of 05/09/23 2237  Wed May 09, 2023  2129 WBC(!): 24.3 [JL]  2209 Lipase(!): 3,986 [JL]    Clinical Course User Index [JL] Ernie Avena, MD   {   Click here for ABCD2, HEART and other calculatorsREFRESH Note before signing :1}                          Medical Decision Making Amount and/or Complexity of Data Reviewed Labs: ordered. Decision-making details documented in ED Course. Radiology: ordered.  Risk Prescription drug management. Decision regarding hospitalization.   ***   CTA PE and CT Abdomen Pelvis: IMPRESSION:  Chest:    1. No evidence of pulmonary embolus.  2. 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  3. Coronary artery atherosclerosis.    Abdomen/pelvis:    1. Acute uncomplicated pancreatitis. No fluid collection,  pseudocyst, or abscess.  2. Indeterminate 2.5 cm hypodensity right lobe liver, favor benign  etiology such as hemangioma. If definitive characterization is  desired, nonemergent outpatient liver MRI could be considered.  3. Trace reactive ascites.    Reached out to Dr. Leonides Schanz of [facility] who recommended NPO at midnight. Lower suspicion for ascending cholangitis at this time, but they will follow along inpatient.  Medicine consulted for admission. {Document critical care time when appropriate:1} {Document review of labs and clinical decision tools ie heart score, Chads2Vasc2 etc:1}   {Document your independent review of radiology images, and any outside records:1} {Document your discussion with family members, caretakers, and with consultants:1} {Document social determinants of health affecting pt's care:1} {Document your decision making why or why not admission, treatments were needed:1} Final Clinical Impression(s) / ED Diagnoses Final diagnoses:  None    Rx / DC Orders ED Discharge Orders     None

## 2023-05-09 NOTE — ED Triage Notes (Signed)
Per EMS, pt started having abd pain at 1430 and then at 1730 she started having chest pain. Rates her pain 8/10. Received 324mg  ASA, NS bolus, and 0.4 nitro en route. Has a hx of a heart tumour.  Pt was diaphoretic, but is now cold during triage. Takes eliquis.

## 2023-05-10 ENCOUNTER — Encounter (HOSPITAL_COMMUNITY): Payer: Self-pay | Admitting: Family Medicine

## 2023-05-10 ENCOUNTER — Inpatient Hospital Stay (HOSPITAL_COMMUNITY): Payer: Medicaid Other

## 2023-05-10 ENCOUNTER — Observation Stay (HOSPITAL_COMMUNITY): Payer: Medicaid Other

## 2023-05-10 DIAGNOSIS — E119 Type 2 diabetes mellitus without complications: Secondary | ICD-10-CM | POA: Diagnosis present

## 2023-05-10 DIAGNOSIS — Z87891 Personal history of nicotine dependence: Secondary | ICD-10-CM | POA: Diagnosis not present

## 2023-05-10 DIAGNOSIS — R7989 Other specified abnormal findings of blood chemistry: Secondary | ICD-10-CM

## 2023-05-10 DIAGNOSIS — D649 Anemia, unspecified: Secondary | ICD-10-CM | POA: Diagnosis not present

## 2023-05-10 DIAGNOSIS — K859 Acute pancreatitis without necrosis or infection, unspecified: Secondary | ICD-10-CM | POA: Diagnosis present

## 2023-05-10 DIAGNOSIS — E785 Hyperlipidemia, unspecified: Secondary | ICD-10-CM | POA: Diagnosis present

## 2023-05-10 DIAGNOSIS — K8020 Calculus of gallbladder without cholecystitis without obstruction: Secondary | ICD-10-CM

## 2023-05-10 DIAGNOSIS — I4891 Unspecified atrial fibrillation: Secondary | ICD-10-CM

## 2023-05-10 DIAGNOSIS — Z825 Family history of asthma and other chronic lower respiratory diseases: Secondary | ICD-10-CM | POA: Diagnosis not present

## 2023-05-10 DIAGNOSIS — Z7985 Long-term (current) use of injectable non-insulin antidiabetic drugs: Secondary | ICD-10-CM | POA: Diagnosis not present

## 2023-05-10 DIAGNOSIS — Z833 Family history of diabetes mellitus: Secondary | ICD-10-CM | POA: Diagnosis not present

## 2023-05-10 DIAGNOSIS — Z7984 Long term (current) use of oral hypoglycemic drugs: Secondary | ICD-10-CM | POA: Diagnosis not present

## 2023-05-10 DIAGNOSIS — Z8249 Family history of ischemic heart disease and other diseases of the circulatory system: Secondary | ICD-10-CM | POA: Diagnosis not present

## 2023-05-10 DIAGNOSIS — R001 Bradycardia, unspecified: Secondary | ICD-10-CM | POA: Diagnosis not present

## 2023-05-10 DIAGNOSIS — K8689 Other specified diseases of pancreas: Secondary | ICD-10-CM | POA: Diagnosis present

## 2023-05-10 DIAGNOSIS — Z609 Problem related to social environment, unspecified: Secondary | ICD-10-CM

## 2023-05-10 DIAGNOSIS — D1803 Hemangioma of intra-abdominal structures: Secondary | ICD-10-CM | POA: Diagnosis present

## 2023-05-10 DIAGNOSIS — R011 Cardiac murmur, unspecified: Secondary | ICD-10-CM | POA: Diagnosis not present

## 2023-05-10 DIAGNOSIS — K8010 Calculus of gallbladder with chronic cholecystitis without obstruction: Secondary | ICD-10-CM | POA: Diagnosis present

## 2023-05-10 DIAGNOSIS — K851 Biliary acute pancreatitis without necrosis or infection: Secondary | ICD-10-CM | POA: Diagnosis present

## 2023-05-10 DIAGNOSIS — Z7901 Long term (current) use of anticoagulants: Secondary | ICD-10-CM

## 2023-05-10 DIAGNOSIS — I10 Essential (primary) hypertension: Secondary | ICD-10-CM | POA: Diagnosis present

## 2023-05-10 DIAGNOSIS — Z79899 Other long term (current) drug therapy: Secondary | ICD-10-CM | POA: Diagnosis not present

## 2023-05-10 DIAGNOSIS — I4892 Unspecified atrial flutter: Secondary | ICD-10-CM | POA: Diagnosis present

## 2023-05-10 DIAGNOSIS — Z6837 Body mass index (BMI) 37.0-37.9, adult: Secondary | ICD-10-CM | POA: Diagnosis not present

## 2023-05-10 LAB — ECHOCARDIOGRAM COMPLETE
AR max vel: 3.21 cm2
AV Area VTI: 3.32 cm2
AV Area mean vel: 3 cm2
AV Mean grad: 7 mmHg
AV Peak grad: 14.9 mmHg
Ao pk vel: 1.93 m/s
Area-P 1/2: 3.65 cm2
Calc EF: 69.2 %
Height: 63 in
S' Lateral: 3.3 cm
Single Plane A2C EF: 64.1 %
Single Plane A4C EF: 71.6 %
Weight: 3344 oz

## 2023-05-10 LAB — GLUCOSE, CAPILLARY
Glucose-Capillary: 171 mg/dL — ABNORMAL HIGH (ref 70–99)
Glucose-Capillary: 114 mg/dL — ABNORMAL HIGH (ref 70–99)
Glucose-Capillary: 188 mg/dL — ABNORMAL HIGH (ref 70–99)
Glucose-Capillary: 112 mg/dL — ABNORMAL HIGH (ref 70–99)

## 2023-05-10 LAB — CBC WITH DIFFERENTIAL/PLATELET
Abs Immature Granulocytes: 0.09 10*3/uL — ABNORMAL HIGH (ref 0.00–0.07)
Basophils Absolute: 0 10*3/uL (ref 0.0–0.1)
Basophils Relative: 0 %
Eosinophils Absolute: 0 10*3/uL (ref 0.0–0.5)
Eosinophils Relative: 0 %
HCT: 39.2 % (ref 36.0–46.0)
Hemoglobin: 13.1 g/dL (ref 12.0–15.0)
Immature Granulocytes: 1 %
Lymphocytes Relative: 10 %
Lymphs Abs: 1.6 10*3/uL (ref 0.7–4.0)
MCH: 28.5 pg (ref 26.0–34.0)
MCHC: 33.4 g/dL (ref 30.0–36.0)
MCV: 85.2 fL (ref 80.0–100.0)
Monocytes Absolute: 0.9 10*3/uL (ref 0.1–1.0)
Monocytes Relative: 5 %
Neutro Abs: 14.2 10*3/uL — ABNORMAL HIGH (ref 1.7–7.7)
Neutrophils Relative %: 84 %
Platelets: 250 10*3/uL (ref 150–400)
RBC: 4.6 MIL/uL (ref 3.87–5.11)
RDW: 13.9 % (ref 11.5–15.5)
WBC: 16.7 10*3/uL — ABNORMAL HIGH (ref 4.0–10.5)
nRBC: 0 % (ref 0.0–0.2)

## 2023-05-10 LAB — APTT: aPTT: 32 seconds (ref 24–36)

## 2023-05-10 LAB — COMPREHENSIVE METABOLIC PANEL
ALT: 139 U/L — ABNORMAL HIGH (ref 0–44)
AST: 154 U/L — ABNORMAL HIGH (ref 15–41)
Albumin: 3 g/dL — ABNORMAL LOW (ref 3.5–5.0)
Alkaline Phosphatase: 112 U/L (ref 38–126)
Anion gap: 9 (ref 5–15)
BUN: 14 mg/dL (ref 6–20)
CO2: 24 mmol/L (ref 22–32)
Calcium: 8.3 mg/dL — ABNORMAL LOW (ref 8.9–10.3)
Chloride: 104 mmol/L (ref 98–111)
Creatinine, Ser: 0.61 mg/dL (ref 0.44–1.00)
GFR, Estimated: >60 mL/min (ref >60)
Glucose, Bld: 178 mg/dL — ABNORMAL HIGH (ref 70–99)
Potassium: 3.3 mmol/L — ABNORMAL LOW (ref 3.5–5.1)
Sodium: 137 mmol/L (ref 135–145)
Total Bilirubin: 1.5 mg/dL — ABNORMAL HIGH (ref 0.3–1.2)
Total Protein: 5.9 g/dL — ABNORMAL LOW (ref 6.5–8.1)

## 2023-05-10 LAB — PROTIME-INR
INR: 1.1 (ref 0.8–1.2)
Prothrombin Time: 14.9 seconds (ref 11.4–15.2)

## 2023-05-10 LAB — LIPASE, BLOOD: Lipase: 842 U/L — ABNORMAL HIGH (ref 11–51)

## 2023-05-10 LAB — HIV ANTIBODY (ROUTINE TESTING W REFLEX): HIV Screen 4th Generation wRfx: NONREACTIVE

## 2023-05-10 MED ORDER — ONDANSETRON HCL 4 MG/2ML IJ SOLN: 4.0000 mg | Freq: Four times a day (QID) | INTRAMUSCULAR | Status: DC | PRN

## 2023-05-10 MED ORDER — HYDROMORPHONE HCL 1 MG/ML IJ SOLN
0.5000 mg | INTRAMUSCULAR | Status: DC | PRN
  Administered 2023-05-10 – 2023-05-14 (×10): 1 mg via INTRAVENOUS
  Filled 2023-05-10 (×10): qty 1

## 2023-05-10 MED ORDER — INSULIN ASPART 100 UNIT/ML IJ SOLN: 0.0000 [IU] | Freq: Three times a day (TID) | INTRAMUSCULAR | Status: DC

## 2023-05-10 MED ORDER — SODIUM CHLORIDE 0.9% FLUSH
3.0000 mL | Freq: Two times a day (BID) | INTRAVENOUS | Status: DC
  Administered 2023-05-10 – 2023-05-14 (×8): 3 mL via INTRAVENOUS

## 2023-05-10 MED ORDER — ONDANSETRON HCL 4 MG PO TABS: 4.0000 mg | ORAL_TABLET | Freq: Four times a day (QID) | ORAL | Status: DC | PRN

## 2023-05-10 MED ORDER — GADOBUTROL 1 MMOL/ML IV SOLN
9.0000 mL | Freq: Once | INTRAVENOUS | Status: AC | PRN
  Administered 2023-05-10: 9 mL via INTRAVENOUS

## 2023-05-10 MED ORDER — HYDROMORPHONE HCL 1 MG/ML IJ SOLN
1.0000 mg | Freq: Once | INTRAMUSCULAR | Status: AC
  Administered 2023-05-10: 1 mg via INTRAVENOUS
  Filled 2023-05-10: qty 1

## 2023-05-10 MED ORDER — LACTATED RINGERS IV SOLN: INTRAVENOUS | Status: DC

## 2023-05-10 MED ORDER — INSULIN ASPART 100 UNIT/ML IJ SOLN
0.0000 [IU] | Freq: Three times a day (TID) | INTRAMUSCULAR | Status: DC
  Administered 2023-05-10 – 2023-05-11 (×3): 2 [IU] via SUBCUTANEOUS
  Administered 2023-05-11: 1 [IU] via SUBCUTANEOUS
  Administered 2023-05-11: 2 [IU] via SUBCUTANEOUS
  Administered 2023-05-12: 1 [IU] via SUBCUTANEOUS
  Administered 2023-05-12 (×2): 2 [IU] via SUBCUTANEOUS
  Administered 2023-05-13 – 2023-05-14 (×2): 1 [IU] via SUBCUTANEOUS

## 2023-05-10 NOTE — ED Notes (Signed)
ED TO INPATIENT HANDOFF REPORT  ED Nurse Name and Phone #: Minerva Areola [phone]  S Name/Age/Gender Debra Diaz 47 y.o. female Room/Bed: 005C/005C  Code Status   Code Status: Full Code  Home/SNF/Other Home Patient oriented to: self, place, time, and situation Is this baseline? Yes   Triage Complete: Triage complete  Chief Complaint Acute pancreatitis [K85.90]  Triage Note Per EMS, pt started having abd pain at 1430 and then at 1730 she started having chest pain. Rates her pain 8/10. Received 324mg  ASA, NS bolus, and 0.4 nitro en route. Has a hx of a heart tumour.  Pt was diaphoretic, but is now cold during triage. Takes eliquis.   Allergies Allergies  Allergen Reactions   Cherry Itching and Rash    Level of Care/Admitting Diagnosis ED Disposition     ED Disposition  Admit   Condition  --   Comment  Hospital Area: [facility] [[id number]]  Level of Care: Telemetry Medical [104]  May place patient in observation at [facility] or [facility] if equivalent level of care is available:: No  Covid Evaluation: Asymptomatic - no recent exposure (last 10 days) testing not required  Diagnosis: Acute pancreatitis [577.0.ICD-9-CM]  Admitting Physician: Vonna Drafts [[id number]]  Attending Physician: Westley Chandler [[id number]]          B Medical/Surgery History Past Medical History:  Diagnosis Date   Amenorrhea 02/18/2021   Anemia    Asymptomatic microscopic hematuria 10/22/2020   Atrial flutter (HCC) 02/16/2022   Blood transfusion without reported diagnosis    Breast pain, right 10/03/2017   Chest pain 02/15/2020   Chest pain with high risk of acute coronary syndrome 02/27/2015   Concussion 05/05/2016   Diabetes mellitus without complication (HCC)    Dizziness 04/22/2022   Elevated troponin I level 02/26/2015   Hyperlipidemia    Hypertension    Hypokalemia    Irregular menses 07/02/2017   Leukocytosis 10/03/2016   Morbid obesity (HCC)     Possible exposure to STD 02/11/2020   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   Screening for STD (sexually transmitted disease) 12/26/2021   Tubular adenoma of colon    Vaginal itching 12/26/2021   Past Surgical History:  Procedure Laterality Date   A-FLUTTER ABLATION N/A 02/16/2022   Procedure: A-FLUTTER ABLATION;  Surgeon: Lanier Prude, MD;  Location: [facility] INVASIVE CV LAB;  Service: Cardiovascular;  Laterality: N/A;   CESAREAN SECTION  1999 and 2011   x 2   LEFT HEART CATHETERIZATION WITH CORONARY ANGIOGRAM N/A 03/02/2015   Procedure: LEFT HEART CATHETERIZATION WITH CORONARY ANGIOGRAM;  Surgeon: Marykay Lex, MD;  Location: [facility] CATH LAB;  Service: Cardiovascular;  Laterality: N/A;   MINIMALLY INVASIVE EXCISION OF ATRIAL MYXOMA N/A 03/04/2015   Procedure: MINIMALLY INVASIVE RESECTION OF LEFT ATRIAL MYXOMA ( USING A BILAYER PATCH CLOSURE);  Surgeon: Purcell Nails, MD;  Location: [facility] OR;  Service: Open Heart Surgery;  Laterality: N/A;   myxoma N/A    chest   TEE WITHOUT CARDIOVERSION N/A 03/01/2015   Procedure: TRANSESOPHAGEAL ECHOCARDIOGRAM (TEE);  Surgeon: Lewayne Bunting, MD;  Location: [facility] ENDOSCOPY;  Service: Cardiovascular;  Laterality: N/A;   TEE WITHOUT CARDIOVERSION N/A 03/04/2015   Procedure: TRANSESOPHAGEAL ECHOCARDIOGRAM (TEE);  Surgeon: Purcell Nails, MD;  Location: [facility] OR;  Service: Open Heart Surgery;  Laterality: N/A;   TUBAL LIGATION  2011     A IV Location/Drains/Wounds Patient Lines/Drains/Airways Status     Active Line/Drains/Airways  Name Placement date Placement time Site Days   Peripheral IV 05/09/23 18 G Left Antecubital 05/09/23  --  Antecubital  1            Intake/Output Last 24 hours  Intake/Output Summary (Last 24 hours) at 05/10/2023 0251 Last data filed at 05/09/2023 1859 Gross per 24 hour  Intake 1000 ml  Output --  Net 1000 ml    Labs/Imaging Results for orders placed or performed during the hospital  encounter of 05/09/23 (from the past 48 hour(s))  Troponin I (High Sensitivity)     Status: None   Collection Time: 05/09/23  8:00 PM  Result Value Ref Range   Troponin I (High Sensitivity) 11 <18 ng/L    Comment: (NOTE) Elevated high sensitivity troponin I (hsTnI) values and significant  changes across serial measurements may suggest ACS but many other  chronic and acute conditions are known to elevate hsTnI results.  Refer to the "Links" section for chest pain algorithms and additional  guidance. Performed at [facility], [address], [place], [place] [address]   CBC with Differential     Status: Abnormal   Collection Time: 05/09/23  8:00 PM  Result Value Ref Range   WBC 24.3 (H) 4.0 - 10.5 K/uL   RBC 4.74 3.87 - 5.11 MIL/uL   Hemoglobin 13.4 12.0 - 15.0 g/dL   HCT 60.4 54.0 - 98.1 %   MCV 89.2 80.0 - 100.0 fL   MCH 28.3 26.0 - 34.0 pg   MCHC 31.7 30.0 - 36.0 g/dL   RDW 19.1 47.8 - 29.5 %   Platelets 276 150 - 400 K/uL   nRBC 0.0 0.0 - 0.2 %   Neutrophils Relative % 88 %   Neutro Abs 21.4 (H) 1.7 - 7.7 K/uL   Lymphocytes Relative 5 %   Lymphs Abs 1.3 0.7 - 4.0 K/uL   Monocytes Relative 6 %   Monocytes Absolute 1.5 (H) 0.1 - 1.0 K/uL   Eosinophils Relative 0 %   Eosinophils Absolute 0.0 0.0 - 0.5 K/uL   Basophils Relative 0 %   Basophils Absolute 0.1 0.0 - 0.1 K/uL   Immature Granulocytes 1 %   Abs Immature Granulocytes 0.16 (H) 0.00 - 0.07 K/uL    Comment: Performed at [facility], [address] [place], [place] [address]  Comprehensive metabolic panel     Status: Abnormal   Collection Time: 05/09/23  8:00 PM  Result Value Ref Range   Sodium 135 135 - 145 mmol/L   Potassium 3.6 3.5 - 5.1 mmol/L   Chloride 103 98 - 111 mmol/L   CO2 22 22 - 32 mmol/L   Glucose, Bld 218 (H) 70 - 99 mg/dL    Comment: Glucose reference range applies only to samples taken after fasting for at least 8 hours.   BUN 20 6 - 20 mg/dL   Creatinine, Ser 8.65 0.44 - 1.00 mg/dL    Calcium 8.1 (L) 8.9 - 10.3 mg/dL   Total Protein 6.2 (L) 6.5 - 8.1 g/dL   Albumin 3.3 (L) 3.5 - 5.0 g/dL   AST 784 (H) 15 - 41 U/L   ALT 111 (H) 0 - 44 U/L   Alkaline Phosphatase 126 38 - 126 U/L   Total Bilirubin 1.2 0.3 - 1.2 mg/dL   GFR, Estimated >69 >62 mL/min    Comment: (NOTE) Calculated using the CKD-EPI Creatinine Equation (2021)    Anion gap 10 5 - 15  Comment: Performed at [facility], [address] [place], [place] [address]  Lipase, blood     Status: Abnormal   Collection Time: 05/09/23  8:00 PM  Result Value Ref Range   Lipase 3,986 (H) 11 - 51 U/L    Comment: RESULT CONFIRMED BY MANUAL DILUTION Performed at [facility], [address] [place], [place] [address]   Lactic acid, plasma     Status: Abnormal   Collection Time: 05/09/23  8:00 PM  Result Value Ref Range   Lactic Acid, Venous 2.1 (HH) 0.5 - 1.9 mmol/L    Comment: CRITICAL RESULT CALLED TO, READ BACK BY AND VERIFIED WITH M. LAMBERT RN 05/09/23 @2110  BY J. WHITE Performed at [facility], [address], [place], [place] [address]   I-Stat beta hCG blood, ED ([facility], [facility], [facility] only)     Status: Abnormal   Collection Time: 05/09/23  8:07 PM  Result Value Ref Range   I-stat hCG, quantitative 13.0 (H) <5 mIU/mL   Comment 3            Comment:   GEST. AGE      CONC.  (mIU/mL)   <=1 WEEK        5 - 50     2 WEEKS       50 - 500     3 WEEKS       100 - 10,000     4 WEEKS     1,000 - 30,000        FEMALE AND NON-PREGNANT FEMALE:     LESS THAN 5 mIU/mL   Troponin I (High Sensitivity)     Status: None   Collection Time: 05/09/23  9:45 PM  Result Value Ref Range   Troponin I (High Sensitivity) 12 <18 ng/L    Comment: (NOTE) Elevated high sensitivity troponin I (hsTnI) values and significant  changes across serial measurements may suggest ACS but many other  chronic and acute conditions are known to elevate hsTnI results.  Refer to the "Links" section for chest pain algorithms and  additional  guidance. Performed at [facility], [address] [place], [place] [address]   Triglycerides     Status: None   Collection Time: 05/09/23  9:45 PM  Result Value Ref Range   Triglycerides 55 <150 mg/dL    Comment: Performed at [facility], [address], [place], [place] [address]  Lactic acid, plasma     Status: Abnormal   Collection Time: 05/09/23 10:34 PM  Result Value Ref Range   Lactic Acid, Venous 2.8 (HH) 0.5 - 1.9 mmol/L    Comment: CRITICAL VALUE NOTED. VALUE IS CONSISTENT WITH PREVIOUSLY REPORTED/CALLED VALUE Performed at [facility], [address], [place], [place] [address]   hCG, quantitative, pregnancy     Status: None   Collection Time: 05/09/23 10:34 PM  Result Value Ref Range   hCG, Beta Chain, Quant, S 2 <5 mIU/mL    Comment:          GEST. AGE      CONC.  (mIU/mL)   <=1 WEEK        5 - 50     2 WEEKS       50 - 500     3 WEEKS       100 - 10,000     4 WEEKS     1,000 - 30,000  5 WEEKS     3,500 - 115,000   6-8 WEEKS     12,000 - 270,000    12 WEEKS     15,000 - 220,000        FEMALE AND NON-PREGNANT FEMALE:     LESS THAN 5 mIU/mL Performed at [facility], [address] [place], [place] [address]    US Abdomen Limited RUQ (LIVER/GB)  Result Date: 05/09/2023 CLINICAL DATA:  Ascending cholangitis EXAM: ULTRASOUND ABDOMEN LIMITED RIGHT UPPER QUADRANT COMPARISON:  CT 05/09/2023 FINDINGS: Gallbladder: Gallstones. Upper normal wall thickness. Negative sonographic Murphy Common bile duct: Diameter: 3.2 mm Liver: Slightly echogenic liver parenchyma. No focal hepatic abnormality. Portal vein is patent on color Doppler imaging with normal direction of blood flow towards the liver. Other: None. IMPRESSION: Cholelithiasis without sonographic evidence for acute cholecystitis. No biliary dilatation. Slightly echogenic liver parenchyma may be seen with hepatic steatosis. Electronically Signed   By: Jasmine Pang M.D.   On:  05/09/2023 23:29   CT ABDOMEN PELVIS W CONTRAST  Result Date: 05/09/2023 CLINICAL DATA:  Chest and abdominal pain, diaphoresis EXAM: CT ANGIOGRAPHY CHEST CT ABDOMEN AND PELVIS WITH CONTRAST TECHNIQUE: Multidetector CT imaging of the chest was performed using the standard protocol during bolus administration of intravenous contrast. Multiplanar CT image reconstructions and MIPs were obtained to evaluate the vascular anatomy. Multidetector CT imaging of the abdomen and pelvis was performed using the standard protocol during bolus administration of intravenous contrast. RADIATION DOSE REDUCTION: This exam was performed according to the departmental dose-optimization program which includes automated exposure control, adjustment of the mA and/or kV according to patient size and/or use of iterative reconstruction technique. CONTRAST:  75mL OMNIPAQUE IOHEXOL 350 MG/ML SOLN COMPARISON:  05/09/2023, 02/14/2022 FINDINGS: CTA CHEST FINDINGS Cardiovascular: This is a technically adequate evaluation of the pulmonary vasculature. No filling defects or pulmonary emboli. Dilated main pulmonary artery measuring up to 4.3 cm consistent with pulmonary arterial hypertension. Heart is unremarkable without pericardial effusion. Atherosclerosis of the coronary vasculature greatest in the LAD distribution. Normal caliber of the thoracic aorta. Mediastinum/Nodes: No enlarged mediastinal, hilar, or axillary lymph nodes. Thyroid gland, trachea, and esophagus demonstrate no significant findings. Lungs/Pleura: No acute airspace disease, effusion, or pneumothorax. Central airways are patent. Musculoskeletal: No acute or destructive bony lesions. Reconstructed images demonstrate no additional findings. Review of the MIP images confirms the above findings. CT ABDOMEN and PELVIS FINDINGS Hepatobiliary: 2.5 cm hypodensity posterior right lobe liver reference image 8/12 demonstrates early centripetal enhancement, stable since prior study and  likely representing benign etiology such as hemangioma. The remainder of the liver is unremarkable. The gallbladder is normal. Pancreas: Pancreatic parenchymal edema and significant peripancreatic fat stranding consistent with acute uncomplicated pancreatitis. No fluid collection, pseudocyst, or abscess. Spleen: Normal in size without focal abnormality. Adrenals/Urinary Tract: 1.5 cm right renal cortical cyst is not require specific imaging follow-up. No urinary tract calculi or obstructive uropathy. The adrenals and bladder are unremarkable. Stomach/Bowel: No bowel obstruction or ileus. Normal appendix right lower quadrant. No bowel wall thickening or inflammatory change. Vascular/Lymphatic: No significant vascular findings are present. No enlarged abdominal or pelvic lymph nodes. Reproductive: Uterus and bilateral adnexa are unremarkable. Other: Central mesenteric edema and trace free fluid within the bilateral flanks consistent with pancreatitis. No free intraperitoneal gas. No abdominal wall hernia. Musculoskeletal: No acute or destructive bony abnormalities. Reconstructed images demonstrate no additional findings. Review of the MIP images confirms the above findings. IMPRESSION: Chest: 1. No evidence of pulmonary embolus. 2. Dilated  main pulmonary artery consistent with pulmonary arterial hypertension. 3. Coronary artery atherosclerosis. Abdomen/pelvis: 1. Acute uncomplicated pancreatitis. No fluid collection, pseudocyst, or abscess. 2. Indeterminate 2.5 cm hypodensity right lobe liver, favor benign etiology such as hemangioma. If definitive characterization is desired, nonemergent outpatient liver MRI could be considered. 3. Trace reactive ascites. Electronically Signed   By: Sharlet Salina M.D.   On: 05/09/2023 22:31   CT Angio Chest PE W and/or Wo Contrast  Result Date: 05/09/2023 CLINICAL DATA:  Chest and abdominal pain, diaphoresis EXAM: CT ANGIOGRAPHY CHEST CT ABDOMEN AND PELVIS WITH CONTRAST  TECHNIQUE: Multidetector CT imaging of the chest was performed using the standard protocol during bolus administration of intravenous contrast. Multiplanar CT image reconstructions and MIPs were obtained to evaluate the vascular anatomy. Multidetector CT imaging of the abdomen and pelvis was performed using the standard protocol during bolus administration of intravenous contrast. RADIATION DOSE REDUCTION: This exam was performed according to the departmental dose-optimization program which includes automated exposure control, adjustment of the mA and/or kV according to patient size and/or use of iterative reconstruction technique. CONTRAST:  75mL OMNIPAQUE IOHEXOL 350 MG/ML SOLN COMPARISON:  05/09/2023, 02/14/2022 FINDINGS: CTA CHEST FINDINGS Cardiovascular: This is a technically adequate evaluation of the pulmonary vasculature. No filling defects or pulmonary emboli. Dilated main pulmonary artery measuring up to 4.3 cm consistent with pulmonary arterial hypertension. Heart is unremarkable without pericardial effusion. Atherosclerosis of the coronary vasculature greatest in the LAD distribution. Normal caliber of the thoracic aorta. Mediastinum/Nodes: No enlarged mediastinal, hilar, or axillary lymph nodes. Thyroid gland, trachea, and esophagus demonstrate no significant findings. Lungs/Pleura: No acute airspace disease, effusion, or pneumothorax. Central airways are patent. Musculoskeletal: No acute or destructive bony lesions. Reconstructed images demonstrate no additional findings. Review of the MIP images confirms the above findings. CT ABDOMEN and PELVIS FINDINGS Hepatobiliary: 2.5 cm hypodensity posterior right lobe liver reference image 8/12 demonstrates early centripetal enhancement, stable since prior study and likely representing benign etiology such as hemangioma. The remainder of the liver is unremarkable. The gallbladder is normal. Pancreas: Pancreatic parenchymal edema and significant peripancreatic  fat stranding consistent with acute uncomplicated pancreatitis. No fluid collection, pseudocyst, or abscess. Spleen: Normal in size without focal abnormality. Adrenals/Urinary Tract: 1.5 cm right renal cortical cyst is not require specific imaging follow-up. No urinary tract calculi or obstructive uropathy. The adrenals and bladder are unremarkable. Stomach/Bowel: No bowel obstruction or ileus. Normal appendix right lower quadrant. No bowel wall thickening or inflammatory change. Vascular/Lymphatic: No significant vascular findings are present. No enlarged abdominal or pelvic lymph nodes. Reproductive: Uterus and bilateral adnexa are unremarkable. Other: Central mesenteric edema and trace free fluid within the bilateral flanks consistent with pancreatitis. No free intraperitoneal gas. No abdominal wall hernia. Musculoskeletal: No acute or destructive bony abnormalities. Reconstructed images demonstrate no additional findings. Review of the MIP images confirms the above findings. IMPRESSION: Chest: 1. No evidence of pulmonary embolus. 2. Dilated main pulmonary artery consistent with pulmonary arterial hypertension. 3. Coronary artery atherosclerosis. Abdomen/pelvis: 1. Acute uncomplicated pancreatitis. No fluid collection, pseudocyst, or abscess. 2. Indeterminate 2.5 cm hypodensity right lobe liver, favor benign etiology such as hemangioma. If definitive characterization is desired, nonemergent outpatient liver MRI could be considered. 3. Trace reactive ascites. Electronically Signed   By: Sharlet Salina M.D.   On: 05/09/2023 22:31   DG Chest Portable 1 View  Result Date: 05/09/2023 CLINICAL DATA:  Chest and epigastric pain EXAM: PORTABLE CHEST 1 VIEW COMPARISON:  11/11/2022 FINDINGS: Low lung volumes are present, causing crowding  of the pulmonary vasculature. Heart size within normal limits. The lungs appear clear. No blunting of the costophrenic angles. No significant bony abnormality observed. IMPRESSION: 1.  No active cardiopulmonary disease is radiographically apparent. 2. Low lung volumes. Electronically Signed   By: Gaylyn Rong M.D.   On: 05/09/2023 20:11   LONG TERM MONITOR (3-14 DAYS)  Result Date: 05/09/2023 HR 32 - 138 bpm, average 54 bpm. 1 pause lasting 3.3 seconds. Rare supraventricular and ventricular ectopy. No sustained arrhythmias. No atrial fibrillation. Symptom trigger episodes correspond to sinus rhythm with PVC's. Sheria Lang T. Lalla Brothers, MD, [facility] Cardiac Electrophysiology    Pending Labs Unresulted Labs (From admission, onward)     Start     Ordered   05/10/23 0500  Comprehensive metabolic panel  Tomorrow morning,   R        05/10/23 0126   05/10/23 0500  CBC with Differential/Platelet  Tomorrow morning,   R        05/10/23 0126   05/10/23 0500  Protime-INR  Tomorrow morning,   R        05/10/23 0126   05/10/23 0500  APTT  Tomorrow morning,   R        05/10/23 0126   05/10/23 0125  Hemoglobin A1c  Once,   R       Comments: To assess prior glycemic control    05/10/23 0126   05/10/23 0114  HIV Antibody (routine testing w rflx)  (HIV Antibody (Routine testing w reflex) panel)  Once,   R        05/10/23 0126            Vitals/Pain Today's Vitals   05/09/23 2316 05/10/23 0149 05/10/23 0250 05/10/23 0250  BP:      Pulse:      Resp:      Temp:    98 F (36.7 C)  TempSrc:    Oral  SpO2:      Weight:      Height:      PainSc: 7  7  4       Isolation Precautions No active isolations  Medications Medications  piperacillin-tazobactam (ZOSYN) IVPB 3.375 g (has no administration in time range)  vancomycin (VANCOCIN) IVPB 750 mg/150 ml premix (has no administration in time range)  sodium chloride flush (NS) 0.9 % injection 3 mL (3 mLs Intravenous Given 05/10/23 0155)  lactated ringers infusion ( Intravenous New Bag/Given 05/10/23 0155)  HYDROmorphone (DILAUDID) injection 0.5-1 mg (1 mg Intravenous Given 05/10/23 0154)  ondansetron (ZOFRAN) tablet 4 mg (has  no administration in time range)    Or  ondansetron (ZOFRAN) injection 4 mg (has no administration in time range)  insulin aspart (novoLOG) injection 0-9 Units (has no administration in time range)  fentaNYL (SUBLIMAZE) injection 50 mcg (50 mcg Intravenous Given 05/09/23 2116)  iohexol (OMNIPAQUE) 350 MG/ML injection 75 mL (75 mLs Intravenous Contrast Given 05/09/23 2213)  HYDROmorphone (DILAUDID) injection 1 mg (1 mg Intravenous Given 05/09/23 2227)  piperacillin-tazobactam (ZOSYN) IVPB 3.375 g (0 g Intravenous Stopped 05/09/23 2325)  vancomycin (VANCOCIN) 1,750 mg in sodium chloride 0.9 % 500 mL IVPB (0 mg Intravenous Stopped 05/10/23 0149)  lactated ringers bolus 1,000 mL (0 mLs Intravenous Stopped 05/10/23 0149)  HYDROmorphone (DILAUDID) injection 1 mg (1 mg Intravenous Given 05/10/23 0029)    Mobility walks     Focused Assessments Cardiac Assessment Handoff:    Lab Results  Component Value Date   CKTOTAL 88 06/17/2011   CKMB  1.8 06/17/2011   TROPONINI <0.03 10/30/2018   Lab Results  Component Value Date   DDIMER 0.29 07/15/2022   Does the Patient currently have chest pain? No    R Recommendations: See Admitting Provider Note  Report given to:   Additional Notes: calm and cooperative
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
## 2023-05-10 NOTE — Progress Notes (Signed)
Clear liquid tray ordered 

## 2023-05-10 NOTE — Progress Notes (Addendum)
FMTS Interim Progress Note  S: Patient seen this morning.  Patient reports pain similar to when she came in the hospital.  She denies having a bowel movement since Monday.  She feels nauseous.  O: BP (!) 140/66 (BP Location: Right Arm)   Pulse (!) 58   Temp 99.9 F (37.7 C) (Oral)   Resp 20   Ht 5\' 3"  (1.6 m)   Wt 94.8 kg   SpO2 97%   BMI 37.02 kg/m     A/P: * Acute pancreatitis GI following, started lactated Ringer's.  Pancreatitis most likely due to gallstones and less likely Ozempic.  No common bile duct dilation on imaging. - General surgery consulted, appreciate recs - GI consulted, appreciate recs - MRCP pending - LR 150 mL/hour IV continuous - Dilaudid 0.5-1 mg IV every 2 hours as needed for pain - Zofran as needed for nausea - NPO for possible GI procedure - D/c'd vanc/zosyn  Bradycardia -Echo pending -Cardiac monitoring -Consider cardiology consult if worsening or for surgical clearance if needed -After surgery, readdress pharmacologic DVT PPX  Chest pain -Repeat EKG, troponin, CXR if acutely worsening -Consider further evaluation for aortic dissection if worsening given sudden onset chest/back pain.  High risk social situation -TOC consult  Atrial flutter (HCC) -Home Eliquis held given NPO and possible procedure, restart as indicated  Lance Muss, MD 05/10/2023, 7:49 AM PGY-1, [facility] Family Medicine Service pager [phone]

## 2023-05-10 NOTE — Progress Notes (Signed)
2D echo attempted, patient unavailable. Will try later 

## 2023-05-10 NOTE — Assessment & Plan Note (Addendum)
Stable, BP wnl -Restart hyzaar on discharge

## 2023-05-10 NOTE — Progress Notes (Signed)
Dilaudid PRN IV pain med given pr pt request

## 2023-05-10 NOTE — Consult Note (Signed)
Consultation  Referring Provider:  [facility]  Primary Care Physician:  Bess Kinds, MD Primary Gastroenterologist:  Dr. Christella Hartigan       Reason for Consultation:     Acute pancreatitis  LOS: 0 days          HPI:   Debra Diaz is a 47 y.o. female with past medical history significant for a flutter s/p ablation 02/2022 (on Eliquis), atrial myxoma s/p resection 2016, type 2 diabetes, hyperlipidemia, presents for evaluation of acute pancreatitis  Patient states yesterday she had acute onset upper abdominal pain that was excruciating and wrapped around her back.  No previous episodes of this.  When it came on it was constant and she had associated sweating, nausea, vomiting.  Reports 7 episodes of nonbloody bilious emesis.  She subsequently presented to ED.  Reports previously being on Ozempic earlier this year.  Has not been on Ozempic in 2 weeks.  Upon presentation lipase 3986, leukocytosis with WBC 24.3.  AST 228/ALT 111/alk phos 126.  Triglycerides 75 (180 1 year ago)  CT abdomen pelvis with contrast showed acute uncomplicated pancreatitis, no fluid collection/pseudocyst.  Hepatic hemangioma, trace reactive ascites.  RUQ ultrasound showed cholelithiasis without acute cholecystitis.  No biliary dilation.  Patient states her last dose of Eliquis was yesterday morning.  Denies weight loss.  Denies melena/hematochezia.  Denies family history of colon cancer.  Occasional NSAID use.  Denies alcohol use.  Remote history of tobacco use.  Colonoscopy done for screening 11/22/2021 with Dr. Gerilyn Pilgrim: 3 mm tubular adenoma in ascending colon, otherwise normal.  Past Medical History:  Diagnosis Date   Amenorrhea 02/18/2021   Anemia    Asymptomatic microscopic hematuria 10/22/2020   Atrial flutter (HCC) 02/16/2022   Blood transfusion without reported diagnosis    Breast pain, right 10/03/2017   Chest pain 02/15/2020   Chest pain with high risk of acute coronary syndrome 02/27/2015    Concussion 05/05/2016   Diabetes mellitus without complication (HCC)    Dizziness 04/22/2022   Elevated troponin I level 02/26/2015   Hyperlipidemia    Hypertension    Hypokalemia    Irregular menses 07/02/2017   Leukocytosis 10/03/2016   Morbid obesity (HCC)    Possible exposure to STD 02/11/2020   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   Screening for STD (sexually transmitted disease) 12/26/2021   Tubular adenoma of colon    Vaginal itching 12/26/2021    Surgical History:  She  has a past surgical history that includes Cesarean section (1999 and 2011); Tubal ligation (2011); TEE without cardioversion (N/A, 03/01/2015); left heart catheterization with coronary angiogram (N/A, 03/02/2015); Minimally invasive excision of atrial myxoma (N/A, 03/04/2015); TEE without cardioversion (N/A, 03/04/2015); myxoma (N/A); and A-FLUTTER ABLATION (N/A, 02/16/2022). Family History:  Her family history includes Asthma in her son; Breast cancer in her maternal grandmother; Diabetes in her father; Heart disease in her mother; Hypertension in her mother; Stomach cancer in her maternal grandfather. Social History:   reports that she quit smoking about 5 years ago. Her smoking use included cigarettes. She has been exposed to tobacco smoke. She has never used smokeless tobacco. She reports current alcohol use. She reports that she does not use drugs.  Prior to Admission medications   Medication Sig Start Date End Date Taking? Authorizing Provider  apixaban (ELIQUIS) 5 MG TABS tablet TAKE 1 TABLET(5 MG) BY MOUTH TWICE DAILY Patient taking differently: Take 5 mg by mouth 2 (two) times daily.  03/07/23  Yes Lanier Prude, MD  linaclotide [facility]) 290 MCG CAPS capsule Take 1 capsule (290 mcg total) by mouth daily before breakfast. Patient taking differently: Take 290 mcg by mouth daily as needed (for constipation). 07/13/22  Yes Quentin Mulling R, PA-C  losartan-hydrochlorothiazide  (HYZAAR) 100-25 MG tablet TAKE 1 TABLET BY MOUTH DAILY 10/04/22  Yes Sowell, Apolinar Junes, MD  metFORMIN (GLUCOPHAGE-XR) 500 MG 24 hr tablet TAKE 2 TABLETS(1000 MG) BY MOUTH IN THE MORNING AND AT BEDTIME 03/08/23  Yes Sowell, Apolinar Junes, MD  Accu-Chek Softclix Lancets lancets USE TO CHECK BLOOD SUGAR UP TO FOUR TIMES DAILY 02/10/23   Bess Kinds, MD  blood glucose meter kit and supplies KIT Dispense based on patient and insurance preference. Use up to four times daily as directed. (FOR ICD-9 250.00, 250.01). 01/19/23   Cora Collum, DO  Blood Pressure KIT Check blood pressure twice a day.  Dx code: I10 11/18/20   Dana Allan, MD  glucose blood (ACCU-CHEK GUIDE) test strip USE TO CHECK BLOOD SUGAR UP TO FOUR TIMES DAILY 02/10/23   Bess Kinds, MD  Semaglutide,0.25 or 0.5MG /DOS, 2 MG/1.5ML SOPN Inject 0.5 mg into the skin once a week. 0.50 mg once weekly for 4 weeks Patient not taking: Reported on 05/10/2023 03/05/23   Cora Collum, DO    Current Facility-Administered Medications  Medication Dose Route Frequency Provider Last Rate Last Admin   HYDROmorphone (DILAUDID) injection 0.5-1 mg  0.5-1 mg Intravenous Q2H PRN Vonna Drafts, MD   1 mg at 05/10/23 0816   insulin aspart (novoLOG) injection 0-9 Units  0-9 Units Subcutaneous TID WC Vonna Drafts, MD   2 Units at 05/10/23 0815   lactated ringers infusion   Intravenous Continuous Vonna Drafts, MD 150 mL/hr at 05/10/23 0516 Infusion Verify at 05/10/23 0516   ondansetron (ZOFRAN) tablet 4 mg  4 mg Oral Q6H PRN Vonna Drafts, MD       Or   ondansetron (ZOFRAN) injection 4 mg  4 mg Intravenous Q6H PRN Vonna Drafts, MD       sodium chloride flush (NS) 0.9 % injection 3 mL  3 mL Intravenous Q12H Vonna Drafts, MD   3 mL at 05/10/23 0816    Allergies as of 05/09/2023 - Review Complete 05/09/2023  Allergen Reaction Noted   Cherry Itching and Rash 09/08/2022    Review of Systems  Constitutional:  Negative for chills, fever and weight loss.   HENT:  Negative for hearing loss and tinnitus.   Eyes:  Negative for blurred vision and double vision.  Respiratory:  Negative for cough and hemoptysis.   Cardiovascular:  Negative for chest pain and palpitations.  Gastrointestinal:  Positive for abdominal pain, nausea and vomiting. Negative for blood in stool, constipation, diarrhea, heartburn and melena.  Genitourinary:  Negative for dysuria and urgency.  Musculoskeletal:  Negative for myalgias and neck pain.  Skin:  Negative for itching and rash.  Neurological:  Negative for seizures and loss of consciousness.  Psychiatric/Behavioral:  Negative for depression and suicidal ideas.        Physical Exam:  Vital signs in last 24 hours: Temp:  [97.8 F (36.6 C)-99.9 F (37.7 C)] 98.3 F (36.8 C) (05/23 0804) Pulse Rate:  [30-60] 59 (05/23 0804) Resp:  [13-27] 16 (05/23 0804) BP: (104-140)/(54-80) 122/80 (05/23 0804) SpO2:  [97 %-100 %] 97 % (05/23 0804) Weight:  [94.8 kg] 94.8 kg (05/22 1902) Last BM Date : 05/09/23 Last BM recorded by nurses in past 5 days No  data recorded  Physical Exam Constitutional:      Appearance: She is obese. She is ill-appearing.  HENT:     Head: Normocephalic and atraumatic.     Nose: Nose normal. No congestion.     Mouth/Throat:     Mouth: Mucous membranes are moist.     Pharynx: Oropharynx is clear.  Eyes:     Extraocular Movements: Extraocular movements intact.     Conjunctiva/sclera: Conjunctivae normal.  Cardiovascular:     Rate and Rhythm: Normal rate and regular rhythm.  Pulmonary:     Effort: Pulmonary effort is normal. No respiratory distress.  Abdominal:     General: Abdomen is flat. Bowel sounds are normal.     Palpations: Abdomen is soft.     Tenderness: There is abdominal tenderness (Upper abdominal tenderness).  Musculoskeletal:        General: No swelling. Normal range of motion.     Cervical back: Normal range of motion and neck supple.  Skin:    General: Skin is warm and  dry.  Neurological:     General: No focal deficit present.     Mental Status: She is oriented to person, place, and time.  Psychiatric:        Mood and Affect: Mood normal.        Behavior: Behavior normal.        Thought Content: Thought content normal.        Judgment: Judgment normal.      LAB RESULTS: Recent Labs    05/09/23 2000  WBC 24.3*  HGB 13.4  HCT 42.3  PLT 276   BMET Recent Labs    05/09/23 2000  NA 135  K 3.6  CL 103  CO2 22  GLUCOSE 218*  BUN 20  CREATININE 0.73  CALCIUM 8.1*   LFT Recent Labs    05/09/23 2000  PROT 6.2*  ALBUMIN 3.3*  AST 228*  ALT 111*  ALKPHOS 126  BILITOT 1.2   PT/INR No results for input(s): "LABPROT", "INR" in the last 72 hours.  STUDIES: US Abdomen Limited RUQ (LIVER/GB)  Result Date: 05/09/2023 CLINICAL DATA:  Ascending cholangitis EXAM: ULTRASOUND ABDOMEN LIMITED RIGHT UPPER QUADRANT COMPARISON:  CT 05/09/2023 FINDINGS: Gallbladder: Gallstones. Upper normal wall thickness. Negative sonographic Murphy Common bile duct: Diameter: 3.2 mm Liver: Slightly echogenic liver parenchyma. No focal hepatic abnormality. Portal vein is patent on color Doppler imaging with normal direction of blood flow towards the liver. Other: None. IMPRESSION: Cholelithiasis without sonographic evidence for acute cholecystitis. No biliary dilatation. Slightly echogenic liver parenchyma may be seen with hepatic steatosis. Electronically Signed   By: Jasmine Pang M.D.   On: 05/09/2023 23:29   CT ABDOMEN PELVIS W CONTRAST  Result Date: 05/09/2023 CLINICAL DATA:  Chest and abdominal pain, diaphoresis EXAM: CT ANGIOGRAPHY CHEST CT ABDOMEN AND PELVIS WITH CONTRAST TECHNIQUE: Multidetector CT imaging of the chest was performed using the standard protocol during bolus administration of intravenous contrast. Multiplanar CT image reconstructions and MIPs were obtained to evaluate the vascular anatomy. Multidetector CT imaging of the abdomen and pelvis was  performed using the standard protocol during bolus administration of intravenous contrast. RADIATION DOSE REDUCTION: This exam was performed according to the departmental dose-optimization program which includes automated exposure control, adjustment of the mA and/or kV according to patient size and/or use of iterative reconstruction technique. CONTRAST:  75mL OMNIPAQUE IOHEXOL 350 MG/ML SOLN COMPARISON:  05/09/2023, 02/14/2022 FINDINGS: CTA CHEST FINDINGS Cardiovascular: This is a technically adequate evaluation of the  pulmonary vasculature. No filling defects or pulmonary emboli. Dilated main pulmonary artery measuring up to 4.3 cm consistent with pulmonary arterial hypertension. Heart is unremarkable without pericardial effusion. Atherosclerosis of the coronary vasculature greatest in the LAD distribution. Normal caliber of the thoracic aorta. Mediastinum/Nodes: No enlarged mediastinal, hilar, or axillary lymph nodes. Thyroid gland, trachea, and esophagus demonstrate no significant findings. Lungs/Pleura: No acute airspace disease, effusion, or pneumothorax. Central airways are patent. Musculoskeletal: No acute or destructive bony lesions. Reconstructed images demonstrate no additional findings. Review of the MIP images confirms the above findings. CT ABDOMEN and PELVIS FINDINGS Hepatobiliary: 2.5 cm hypodensity posterior right lobe liver reference image 8/12 demonstrates early centripetal enhancement, stable since prior study and likely representing benign etiology such as hemangioma. The remainder of the liver is unremarkable. The gallbladder is normal. Pancreas: Pancreatic parenchymal edema and significant peripancreatic fat stranding consistent with acute uncomplicated pancreatitis. No fluid collection, pseudocyst, or abscess. Spleen: Normal in size without focal abnormality. Adrenals/Urinary Tract: 1.5 cm right renal cortical cyst is not require specific imaging follow-up. No urinary tract calculi or  obstructive uropathy. The adrenals and bladder are unremarkable. Stomach/Bowel: No bowel obstruction or ileus. Normal appendix right lower quadrant. No bowel wall thickening or inflammatory change. Vascular/Lymphatic: No significant vascular findings are present. No enlarged abdominal or pelvic lymph nodes. Reproductive: Uterus and bilateral adnexa are unremarkable. Other: Central mesenteric edema and trace free fluid within the bilateral flanks consistent with pancreatitis. No free intraperitoneal gas. No abdominal wall hernia. Musculoskeletal: No acute or destructive bony abnormalities. Reconstructed images demonstrate no additional findings. Review of the MIP images confirms the above findings. IMPRESSION: Chest: 1. No evidence of pulmonary embolus. 2. Dilated main pulmonary artery consistent with pulmonary arterial hypertension. 3. Coronary artery atherosclerosis. Abdomen/pelvis: 1. Acute uncomplicated pancreatitis. No fluid collection, pseudocyst, or abscess. 2. Indeterminate 2.5 cm hypodensity right lobe liver, favor benign etiology such as hemangioma. If definitive characterization is desired, nonemergent outpatient liver MRI could be considered. 3. Trace reactive ascites. Electronically Signed   By: Sharlet Salina M.D.   On: 05/09/2023 22:31   CT Angio Chest PE W and/or Wo Contrast  Result Date: 05/09/2023 CLINICAL DATA:  Chest and abdominal pain, diaphoresis EXAM: CT ANGIOGRAPHY CHEST CT ABDOMEN AND PELVIS WITH CONTRAST TECHNIQUE: Multidetector CT imaging of the chest was performed using the standard protocol during bolus administration of intravenous contrast. Multiplanar CT image reconstructions and MIPs were obtained to evaluate the vascular anatomy. Multidetector CT imaging of the abdomen and pelvis was performed using the standard protocol during bolus administration of intravenous contrast. RADIATION DOSE REDUCTION: This exam was performed according to the departmental dose-optimization program  which includes automated exposure control, adjustment of the mA and/or kV according to patient size and/or use of iterative reconstruction technique. CONTRAST:  75mL OMNIPAQUE IOHEXOL 350 MG/ML SOLN COMPARISON:  05/09/2023, 02/14/2022 FINDINGS: CTA CHEST FINDINGS Cardiovascular: This is a technically adequate evaluation of the pulmonary vasculature. No filling defects or pulmonary emboli. Dilated main pulmonary artery measuring up to 4.3 cm consistent with pulmonary arterial hypertension. Heart is unremarkable without pericardial effusion. Atherosclerosis of the coronary vasculature greatest in the LAD distribution. Normal caliber of the thoracic aorta. Mediastinum/Nodes: No enlarged mediastinal, hilar, or axillary lymph nodes. Thyroid gland, trachea, and esophagus demonstrate no significant findings. Lungs/Pleura: No acute airspace disease, effusion, or pneumothorax. Central airways are patent. Musculoskeletal: No acute or destructive bony lesions. Reconstructed images demonstrate no additional findings. Review of the MIP images confirms the above findings. CT ABDOMEN and PELVIS FINDINGS  Hepatobiliary: 2.5 cm hypodensity posterior right lobe liver reference image 8/12 demonstrates early centripetal enhancement, stable since prior study and likely representing benign etiology such as hemangioma. The remainder of the liver is unremarkable. The gallbladder is normal. Pancreas: Pancreatic parenchymal edema and significant peripancreatic fat stranding consistent with acute uncomplicated pancreatitis. No fluid collection, pseudocyst, or abscess. Spleen: Normal in size without focal abnormality. Adrenals/Urinary Tract: 1.5 cm right renal cortical cyst is not require specific imaging follow-up. No urinary tract calculi or obstructive uropathy. The adrenals and bladder are unremarkable. Stomach/Bowel: No bowel obstruction or ileus. Normal appendix right lower quadrant. No bowel wall thickening or inflammatory change.  Vascular/Lymphatic: No significant vascular findings are present. No enlarged abdominal or pelvic lymph nodes. Reproductive: Uterus and bilateral adnexa are unremarkable. Other: Central mesenteric edema and trace free fluid within the bilateral flanks consistent with pancreatitis. No free intraperitoneal gas. No abdominal wall hernia. Musculoskeletal: No acute or destructive bony abnormalities. Reconstructed images demonstrate no additional findings. Review of the MIP images confirms the above findings. IMPRESSION: Chest: 1. No evidence of pulmonary embolus. 2. Dilated main pulmonary artery consistent with pulmonary arterial hypertension. 3. Coronary artery atherosclerosis. Abdomen/pelvis: 1. Acute uncomplicated pancreatitis. No fluid collection, pseudocyst, or abscess. 2. Indeterminate 2.5 cm hypodensity right lobe liver, favor benign etiology such as hemangioma. If definitive characterization is desired, nonemergent outpatient liver MRI could be considered. 3. Trace reactive ascites. Electronically Signed   By: Sharlet Salina M.D.   On: 05/09/2023 22:31   DG Chest Portable 1 View  Result Date: 05/09/2023 CLINICAL DATA:  Chest and epigastric pain EXAM: PORTABLE CHEST 1 VIEW COMPARISON:  11/11/2022 FINDINGS: Low lung volumes are present, causing crowding of the pulmonary vasculature. Heart size within normal limits. The lungs appear clear. No blunting of the costophrenic angles. No significant bony abnormality observed. IMPRESSION: 1. No active cardiopulmonary disease is radiographically apparent. 2. Low lung volumes. Electronically Signed   By: Gaylyn Rong M.D.   On: 05/09/2023 20:11   LONG TERM MONITOR (3-14 DAYS)  Result Date: 05/09/2023 HR 32 - 138 bpm, average 54 bpm. 1 pause lasting 3.3 seconds. Rare supraventricular and ventricular ectopy. No sustained arrhythmias. No atrial fibrillation. Symptom trigger episodes correspond to sinus rhythm with PVC's. Sheria Lang T. Lalla Brothers, MD, [facility], [facility] Cardiac  Electrophysiology      Impression    Acute pancreatitis, likely secondary to gallstones -CT abdomen pelvis with contrast shows acute uncomplicated pancreatitis without abscess or pseudocyst.  Hepatic hemangioma -RUQ ultrasound shows cholelithiasis without acute cholecystitis -WBC 24.3 -AST 228/ALT 111/alk phos 126 -Lipase 3986 -Triglycerides 65 -BUN 20, creatinine 0.73 - On zosyn Likely acute gallstone pancreatitis with presence of gallstones on imaging.  Triglycerides within normal limits.  No significant alcohol use.  Patient was on Ozempic which could cause pancreatitis, however gallstones are more likely in this case.  No CBD dilation on imaging. No previous episodes of pancreatitis.  Bradycardia -Heart rate 38 on EKG, known history of sinus bradycardia and following with cardiology  Afib -Last dose of Eliquis 5/22 AM    Plan   -Fluid replacement lactated ringers 125-150cc/hr (3 cc/kg/hr) can decrease after 12-24 hours to 100-125cc/hr. (prevents ATN/necrosis) -Pain control per inpatient medical team -Encourage early ambulation -Advance diet as tolerated -Trend CBC/CMP  Thank you for your kind consultation, we will continue to follow.   Guiliana Shor Leanna Sato  05/10/2023, 8:38 AM

## 2023-05-10 NOTE — Progress Notes (Signed)
  Echocardiogram 2D Echocardiogram has been performed.  Debra Diaz 05/10/2023, 3:33 PM

## 2023-05-10 NOTE — Assessment & Plan Note (Addendum)
Likely in setting of gallstone pancreatitis. Improving; AST normalized. PT/INR and PTT WNL.

## 2023-05-10 NOTE — Assessment & Plan Note (Addendum)
Hx aflutter s/p ablation 02/2022. On home Eliquis for ppx. Gave one treatment dose of Lovenox. -Eliquis held given NPO and possible procedure, restart as indicated

## 2023-05-10 NOTE — Assessment & Plan Note (Deleted)
GI following, started lactated Ringer's. Pancreatitis most likely due to gallstones and less likely Ozempic. No common bile duct dilation on imaging.  Echo shows LVEF 60-65%. MRCP shows cholelithiasis with trace pericholecystic fluid without significant gallbladder wall thickening. Surgery following and will revisit if patient is appropriate for cholecystectomy. -General surgery following, appreciate recommendations - Lactated Ringer's at 150 mL/hour continuous - Dilaudid 0.5-1 mg IV every 2 hours as needed for pain - Zofran as needed for nausea - NPO for possible cholecystectomy

## 2023-05-10 NOTE — Assessment & Plan Note (Addendum)
Stable. Pt with known history of sinus bradycardia, has followed with cardiology for this. On Eliquis at home, hx aflutter.  Echo shows LVEF 60-65%.  -Cardiac monitoring -Consider cardiology consult if worsening

## 2023-05-10 NOTE — Discharge Instructions (Addendum)
Dear Debra Diaz,  Thank you for letting us participate in your care. You were hospitalized for Acute gallstone pancreatitis. You had a surgery on 05/13/23 where your gallbladder was removed.  POST-HOSPITAL & CARE INSTRUCTIONS Go to your follow up appointments (listed below)   DOCTOR'S APPOINTMENT   Future Appointments  Date Time Provider Department Center  05/18/2023  2:30 PM Bess Kinds, MD [facility]  05/30/2023  9:00 AM Tillery, Mariam Dollar, PA-C [facility]     Take care and be well!  Family Medicine Teaching Service Inpatient Team [place]  [facility]  [address] [place], [place] [address] [phone]     [facility] assistance programs. If you are behind on your bills and expenses, and need some help to make it through a short term hardship or financial emergency, there are several organizations and charities in the [place] area that may be able to help. They range from the [employer], [employer], Landscape architect of [employer] and the local community action agency, the [employer], [employer]. These groups may be able to provide you resources to help pay your utility bills, rent, and they even offer housing assistance.  Crisis assistance program Find help for paying your rent, electric bills, free food, and even funds to pay your mortgage. The [employer] (786)475-6998) offers several services to local families, as funding allows. The Emergency Assistance Program (EAP), which they administer, provides household goods, free food, clothing, and financial aid to people in need in the [facility] area. The EAP program does have some qualification, and counselors will interview clients for financial assistance by written referral only. Referrals need to be made by the Department of Social Services or by other EAP approved human services  agencies or charities in the area.  Money for resources for emergency assistance are available for security deposits for rent, water, electric, and gas, past due rent, utility bills, past due mortgage payments, food, and clothing. The [employer] also operates a Programme researcher, broadcasting/film/video on the site. More [employer].  Open [employer] Ministries of [employer], which can be reached at ([phone], offers emergency assistance programs for those in need of help, such as food, rent assistance, a soup kitchen, shelter, and clothing. They are based in [facility] but provide a number of services to those that qualify for assistance. Continue with Open [employer]ries programs.  [facility] Department of Social Services may be able to offer temporary financial assistance and cash grants for paying rent and utilities. Help may be provided for local county residents who may be experiencing personal crisis when other resources, including government programs, are not available. Call [phone]  [place] Society, which is based in [place], provides financial assistance of up to $50.00 to help pay for rent, utilities, cooling bills, rent, and prescription medications. The program also provides secondhand furniture to those in need. [phone]  [employer] is a Geneticist, molecular. The organization can offer emergency assistance for paying rent, electric bills, utilities, food, household products and furniture. They offer extensive emergency and transitional housing for families, children and single women, and also run a Boy's and Dole Food. [address] [employer] [address], [employer], and other aid offered too. [address] [place], [place] [address], [phone]  Additional locations of the [employer] are in [place] and other nearby  communities. When you have an emergency, need free food, money for basic needs, or just need  assistance around Christmas, then the [employer] may have the resources you need. Or they can refer you to nearby agencies. Learn more.  [place] Low Income Risk manager - This is offered for [facility] families. The federal government created [employer] Program provides a one-time cash grant payment to help eligible low-income families pay their electric and heating bills. [address] [place], [place] [address], [phone]  Government and [employer] - The county administers several emergency and self-sufficiency programs. Residents of [place] can get help with energy bills and food, rent, and other expenses. In addition, work with a Sports coach who may be able to help you find a job or improve your employment skills. More [place] public assistance.  [place] Emergency Assistance - A program offers emergency utility and rent funds for greater [employer] area residents. The program can also provide counseling and referrals to charities and government programs. Also provides food and a free meal program that serves lunch Mondays - Saturdays and dinner seven days per week to individuals in the community. [address], [place], [place] [address], [phone]  [employer] - Offers affordable apartment and housing communities across [place] and [place]. The low income and seniors can access public housing, rental assistance to qualified applicants, and apply for the section 8 rent subsidy program. Other programs include Chiropractor and Engineer, maintenance. [address] [place], [place] [address], dial [phone].  Basic needs such as clothing - Low income families can receive free items (school supplies, clothes, holiday assistance, etc.) from clothing closets while more moderate income [address] families can shop at [employer].  Locations across the area help the needy. Get information on [place] free clothing centers.  The [facility] provides transitional housing to veterans and the disabled. Clients will also access other services too, including life skills classes, case management, and assistance in finding permanent housing. [address], [place], [place] [address], call [phone]  [facility] Transitional Housing in [place] is for people who were just evicted or that are formerly homeless. The non-profit will also help then gain self-sufficiency, find a home or apartment to live in, and also provides information on rent assistance when needed. Dial [phone]  [employer] Partnership to End Homelessness is available in [place]. Families that were evicted or that are homeless can gain shelter, food, clothing, furniture, and also emergency financial assistance. Other services include financial skills and life skills coaching, job training, and case management. [address], [place], [place] [address]. Telephone [phone].  The [employer], [employer]. runs the [employer]. This can help people save money on their heating and summer cooling bills, and is free to low income families. Free upgrades can be made to your home. Phone [phone]  Many of the non-profits and programs mentioned above are all inclusive, meaning they can meet many needs of the low income, such as energy bills, food, rent, and more. However there are several organizations that focus just on rent and housing. Read more on rent assistance in [place] region.  Legal assistance for evictions, foreclosure, and more If you need free legal advice on civili issues, such as foreclosures, evictions, Electronics engineer, government programs, domestic issues and more, Armed forces operational officer Aid of [facility]) is a Associate Professor firm  that provides free legal services and counsel to  lower income people, seniors, disabled, and others. The goal is to ensure everyone has access to justice and fair representation.  Call them at [phone], or click here to learn more about [place] free legal assistance programs.  [place] [employer] and funds for emergency expenses The [employer] is another organization that can provide people with [employer] and funds to pay bills. Their assistance depends on funding, and the demand for help is always very high. They can provide cash to help pay rent, a missed mortgage payment, or gas, electric, and water bills. But the assistance doesn't stop there. They also have a food pantry on site, which can provide food once every three (3) months to people who need help. The [employer] can also offer a Engineering geologist once every three (3) months for a maximum three (3) times. After receiving this voucher over that period of time, applicants can receive this aid one every six (6) months after that. [phone].  Kohl's action agency The [employer], [employer]. offers job and Dispensing optician. Resources are focused on helping students obtain the skills and experiences that are necessary to compete in today's challenging and tight job market. The non-profit faith-based community action agency offers internship trainings as well as classroom instruction. Economically disadvantaged and challenged individuals and potential employers can use their services. Classes are tailored to meet the needs of people in the [facility] region. [place], [place] [address], [phone]    Foreclosure prevention services Housing Counseling and Education is also offered by [employer] of the [place]. The agency (phone number is below) is a Engineer, structural providing foreclosure advice and counseling. They offer mortgage resolution counseling and also reverse mortgage  counseling. Counselors can direct people to both [employer], as well as [employer] foreclosure assistance options.  Warehouse manager has locations in [place] and [employer]. They run debt and foreclosure prevention programs for local families. A sampling of the programs offered include both Budget and Housing Counseling. This includes money management, financial advice, budget review and development of a written action plan with a Pensions consultant to help solve specific individual financial problems. In addition, housing and mortgage counselors can also provide pre- and post-purchase homeownership counseling, default resolution counseling (to prevent foreclosure) and reverse mortgage counseling. A Debt Management Program allows people and families with a high level of credit card or medical debt to consolidate and repay consumer debt and loans to creditors and rebuild positive credit ratings and scores. [phone]  Debt assistance programs Receive free counseling and debt help from [facility] of the [place]. The [facility] based agency can be reached at [phone]. The counselors provide free help, and the services include budget counseling. This will help people manage their expenses and set goals. They also offer a Forensic scientist, which will help individuals consolidate their debts and become debt free. Most of the workshops and services are free.  Community clinics in [place] of the leading health and dental centers are listed below. They may be able to provide medication, physicals, dental care, and general family care to residents of all incomes and backgrounds across the region. Some of the programs focus on the low income and underinsured. However if these clinics can't meet your needs, find information and details on more clinics in [facility].  Some of the  options include [employer] of [employer]. This center provides free or low cost health care to low-income adults 18 - 64, who have no health insurance. Among other services offered include a pharmacy and eye clinic. Phone [phone]  [facility], which is located in [place], is a community clinic that provides primary medical and health care to uninsured and underinsured adults and families, as well as the low income, in the greater [place] area on a sliding-fee scale. Call [phone]  [place] Adult Dental Program - They run a dental assistance program that is organized by [facility] to provide dental services and aid to [employer]. Services offered by the dental clinic are limited to extractions, pain management, and minor restorative care. [phone]  [place] [facility] has locations in [facility] and [place]. The community clinics provide complete pediatric care including primary health, mental health, social work, neurology, cardiology, asthma. Dial [phone].  In addition to those [address] and [employer], find other free community clinics in [place] and across the county.  Food pantry and assistance Some of the local food pantries and distribution centers to call for free food and groceries include The Hive of [place] (phone [phone]), The [facility] (phone [phone]) and also [employer]. Dial [phone].  Several other food banks in the region provide clothing, free food and meals, access to soup kitchens and other help. Find the addresses and phone numbers of more food pantries in [place]. http://www.needhelppayingbills.com/html/[place]_assistance_pro.html  .

## 2023-05-10 NOTE — Hospital Course (Addendum)
  Acute pancreatitis P/w severe, sudden onset epigastric and RUQ pain radiating to back. Lipase elevated 3986 and downtrended to 103. Leukocytosis 24.3 and downtrended to 10. Elevated lactate. Cholelithiasis noted on RUQ u/s, but no evidence of acute cholecystitis or biliary dilitation.  Etiology most likely from gallstone pancreatitis. Considered Ozempic use however, reports not taking for two weeks.  GI saw patient and started fluids. MRCP did not show pancreatic necrosis or pancreatic fluid collection.  There was pancreatic duct dilatation.  We continued pain control and advance diet as tolerated.  Surgery consulted and laparoscopic cholecystectomy performed on 05/13/2023. Patient discharged in stable condition with pain meds and surgery follow up to be scheduled.  Bradycardia  Sinus bradycardia with HR 38 on EKG. HR improved to 50s-60s on monitor upon admission. Patient with known history of sinus bradycardia, has followed with cardiology for this. Underwent ambulatory monitoring recently, no treatment. She also has a history of atrial flutter s/p ablation in 02/2022. On Eliquis at home. Also has a history of atrial myxoma s/p resection in 2016. Echo in 01/2022 with EF 50-55%. Cardiac exam unremarkable aside from soft systolic murmur.  Echo during this hospital stay showed EF 60-65%.  Heart rate during the hospital stay ranged from 40s-70s.  PCP f/u Ensure pain control, f/u with surgery Ensure f/u with cardiology for bradycardia Ozempic dc'd upon discharge due to pancreatitis

## 2023-05-10 NOTE — Assessment & Plan Note (Addendum)
Pancreatitis most likely due to gallstones and less likely Ozempic.  On exam, patient's pain is improving and abdominal exam had improving pain tolerance with deep palpation.  WBC downtrending: 16.7>13. No common bile duct dilation on imaging.  MRCP shows cholelithiasis with trace pericholecystic fluid without significant gallbladder wall thickening. Surgery following and will revisit if patient is appropriate for cholecystectomy. - General surgery following, appreciate recommendations - Lactated Ringer's at 150 mL/hour continuous - Dilaudid 0.5-1 mg IV every 2 hours as needed for pain - Zofran as needed for nausea - NPO for possible cholecystectomy
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
## 2023-05-10 NOTE — Progress Notes (Signed)
Debra Diaz is a 46 y.o. female patient admitted. Awake, alert - oriented  X 4 - no acute distress noted.  VSS - Blood pressure (!) 140/66, pulse (!) 58, temperature 99.9 F (37.7 C), temperature source Oral, resp. rate 20, height 5\' 3"  (1.6 m), weight 94.8 kg, SpO2 97 %.    IV in place, occlusive dsg intact without redness.    Will cont to eval and treat per MD orders.  Rolland Porter, RN 05/10/2023 4:16 AM

## 2023-05-11 DIAGNOSIS — K859 Acute pancreatitis without necrosis or infection, unspecified: Secondary | ICD-10-CM

## 2023-05-11 LAB — CBC
HCT: 37 % (ref 36.0–46.0)
Hemoglobin: 12.2 g/dL (ref 12.0–15.0)
MCH: 29.2 pg (ref 26.0–34.0)
MCHC: 33 g/dL (ref 30.0–36.0)
MCV: 88.5 fL (ref 80.0–100.0)
Platelets: 224 10*3/uL (ref 150–400)
RBC: 4.18 MIL/uL (ref 3.87–5.11)
RDW: 14.1 % (ref 11.5–15.5)
WBC: 13 10*3/uL — ABNORMAL HIGH (ref 4.0–10.5)
nRBC: 0 % (ref 0.0–0.2)

## 2023-05-11 LAB — GLUCOSE, CAPILLARY
Glucose-Capillary: 123 mg/dL — ABNORMAL HIGH (ref 70–99)
Glucose-Capillary: 197 mg/dL — ABNORMAL HIGH (ref 70–99)
Glucose-Capillary: 151 mg/dL — ABNORMAL HIGH (ref 70–99)
Glucose-Capillary: 107 mg/dL — ABNORMAL HIGH (ref 70–99)

## 2023-05-11 LAB — COMPREHENSIVE METABOLIC PANEL
ALT: 90 U/L — ABNORMAL HIGH (ref 0–44)
AST: 64 U/L — ABNORMAL HIGH (ref 15–41)
Albumin: 2.6 g/dL — ABNORMAL LOW (ref 3.5–5.0)
Alkaline Phosphatase: 95 U/L (ref 38–126)
Anion gap: 7 (ref 5–15)
BUN: 11 mg/dL (ref 6–20)
CO2: 27 mmol/L (ref 22–32)
Calcium: 7.9 mg/dL — ABNORMAL LOW (ref 8.9–10.3)
Chloride: 100 mmol/L (ref 98–111)
Creatinine, Ser: 0.6 mg/dL (ref 0.44–1.00)
GFR, Estimated: >60 mL/min (ref >60)
Glucose, Bld: 132 mg/dL — ABNORMAL HIGH (ref 70–99)
Potassium: 3.5 mmol/L (ref 3.5–5.1)
Sodium: 134 mmol/L — ABNORMAL LOW (ref 135–145)
Total Bilirubin: 1.4 mg/dL — ABNORMAL HIGH (ref 0.3–1.2)
Total Protein: 5.3 g/dL — ABNORMAL LOW (ref 6.5–8.1)

## 2023-05-11 LAB — HEMOGLOBIN A1C
Hgb A1c MFr Bld: 6.5 % — ABNORMAL HIGH (ref 4.8–5.6)
Mean Plasma Glucose: 140 mg/dL

## 2023-05-11 LAB — LIPASE, BLOOD: Lipase: 364 U/L — ABNORMAL HIGH (ref 11–51)

## 2023-05-11 MED ORDER — ENOXAPARIN SODIUM 100 MG/ML IJ SOSY
1.0000 mg/kg | PREFILLED_SYRINGE | Freq: Once | INTRAMUSCULAR | Status: AC
  Administered 2023-05-11: 95 mg via SUBCUTANEOUS
  Filled 2023-05-11: qty 0.95

## 2023-05-11 MED ORDER — SODIUM CHLORIDE 0.9 % IV SOLN: 2.0000 g | INTRAVENOUS | Status: DC

## 2023-05-11 NOTE — Evaluation (Signed)
Physical Therapy Evaluation Patient Details Name: Debra Diaz MRN: [id number] DOB: 1976-02-22 Today's Date: 05/11/2023  History of Present Illness  Pt is a 47 y/o F admitted on 05/10/23 after presenting with c/o abdominal & chest pain & found to have acute pancreatitis. PMH: sinus bradycardia, a-flutter s/p ablation 3/23, atrial myxoma s/p resection 2016, DM2, HLD, HTN  Clinical Impression  Pt seen for PT evaluation with pt agreeable to tx. Pt reports she was living with her children in a 1 level home with 3 steps without rails to enter & independent without AD prior to admission. On this date, pt notes "a little" abdominal pain but is able to complete bed mobility with mod I, STS independently, and gait with LUE on IV pole with supervision with slow, steady gait speed. Pt notes she feels the need to hold to IV pole vs ambulating without UE support at this time. Will continue to follow pt acutely to address balance, gait without AD, endurance, and strength.       Recommendations for follow up therapy are one component of a multi-disciplinary discharge planning process, led by the attending physician.  Recommendations may be updated based on patient status, additional functional criteria and insurance authorization.  Follow Up Recommendations       Assistance Recommended at Discharge PRN  Patient can return home with the following  Assistance with cooking/housework;Help with stairs or ramp for entrance    Equipment Recommendations None recommended by PT  Recommendations for Other Services       Functional Status Assessment Patient has had a recent decline in their functional status and demonstrates the ability to make significant improvements in function in a reasonable and predictable amount of time.     Precautions / Restrictions Precautions Precautions: None Restrictions Weight Bearing Restrictions: No      Mobility  Bed Mobility Overal bed mobility: Modified  Independent             General bed mobility comments: supine>sit with HOB elevated    Transfers Overall transfer level: Independent Equipment used: None Transfers: Sit to/from Stand Sit to Stand: Independent                Ambulation/Gait Ambulation/Gait assistance: Modified independent (Device/Increase time) Gait Distance (Feet): 150 Feet Assistive device: IV Pole Gait Pattern/deviations: Decreased step length - right, Decreased step length - left, Decreased stride length Gait velocity: significantly decreased     General Gait Details: Pt feels like she needs to hold to IV pole for balance & c/o slight dizziness when first walking but notes this quickly goes away.  Stairs            Wheelchair Mobility    Modified Rankin (Stroke Patients Only)       Balance Overall balance assessment: Needs assistance Sitting-balance support: Feet supported Sitting balance-Leahy Scale: Normal     Standing balance support: During functional activity, Single extremity supported Standing balance-Leahy Scale: Fair                               Pertinent Vitals/Pain Pain Assessment Pain Assessment: Faces Faces Pain Scale: Hurts a little bit Pain Location: abdomen Pain Descriptors / Indicators: Guarding, Grimacing, Discomfort Pain Intervention(s): Monitored during session, Premedicated before session    Home Living Family/patient expects to be discharged to:: Private residence Living Arrangements: Children Available Help at Discharge: Family;Friend(s);Available PRN/intermittently Type of Home: House Home Access: Stairs to enter Entrance Stairs-Rails:  None Entrance Stairs-Number of Steps: 3   Home Layout: One level Home Equipment: None      Prior Function Prior Level of Function : Independent/Modified Independent             Mobility Comments: Independent without AD       Hand Dominance        Extremity/Trunk Assessment   Upper  Extremity Assessment Upper Extremity Assessment: Overall WFL for tasks assessed    Lower Extremity Assessment Lower Extremity Assessment: Overall WFL for tasks assessed;Generalized weakness       Communication   Communication: Prefers language other than [place];Interpreter utilized (Attempted to use iPad interpreter Gardiner Barefoot [phone]) but poor connection despite multiple attempts, pt reports feeling confident & able to speak enough English to participate in therapy without interpreter)  Cognition Arousal/Alertness: Awake/alert Behavior During Therapy: WFL for tasks assessed/performed Overall Cognitive Status: Within Functional Limits for tasks assessed                                          General Comments General comments (skin integrity, edema, etc.): HR 83 bpm after gait    Exercises     Assessment/Plan    PT Assessment Patient needs continued PT services  PT Problem List Decreased strength;Pain;Decreased activity tolerance;Decreased balance;Decreased mobility;Decreased knowledge of use of DME       PT Treatment Interventions DME instruction;Therapeutic exercise;Gait training;Balance training;Stair training;Neuromuscular re-education;Functional mobility training;Patient/family education;Therapeutic activities    PT Goals (Current goals can be found in the Care Plan section)  Acute Rehab PT Goals Patient Stated Goal: get better PT Goal Formulation: With patient Time For Goal Achievement: 05/25/23 Potential to Achieve Goals: Good    Frequency Min 3X/week     Co-evaluation               AM-PAC PT "6 Clicks" Mobility  Outcome Measure Help needed turning from your back to your side while in a flat bed without using bedrails?: None Help needed moving from lying on your back to sitting on the side of a flat bed without using bedrails?: None Help needed moving to and from a bed to a chair (including a wheelchair)?: None Help needed standing up  from a chair using your arms (e.g., wheelchair or bedside chair)?: None Help needed to walk in hospital room?: A Little Help needed climbing 3-5 steps with a railing? : A Little 6 Click Score: 22    End of Session   Activity Tolerance: Patient tolerated treatment well Patient left: in bed;with call bell/phone within reach Nurse Communication: Mobility status PT Visit Diagnosis: Unsteadiness on feet (R26.81);Muscle weakness (generalized) (M62.81)    Time: 9147-8295 PT Time Calculation (min) (ACUTE ONLY): 14 min   Charges:   PT Evaluation $PT Eval Low Complexity: 1 Low          Aleda Grana, PT, DPT 05/11/23, 3:41 PM   Sandi Mariscal 05/11/2023, 3:40 PM

## 2023-05-11 NOTE — Progress Notes (Signed)
Progress Note     Subjective: Patient reports she is still having some RUQ pain but much better than yesterday. She is hungry this AM and denies nausea or vomiting. Discussed laparoscopic cholecystectomy and she is agreeable to proceed if less tender tomorrow.   Objective: Vital signs in last 24 hours: Temp:  [98.5 F (36.9 C)-99.8 F (37.7 C)] 99.8 F (37.7 C) (05/24 0822) Pulse Rate:  [55-63] 55 (05/24 0822) Resp:  [16-18] 17 (05/24 0822) BP: (109-129)/(68-78) 129/76 (05/24 0822) SpO2:  [96 %-97 %] 96 % (05/24 0822) Last BM Date : 05/09/23  Intake/Output from previous day: 05/23 0701 - 05/24 0700 In: 1116.9 [P.O.:200; I.V.:916.9] Out: -  Intake/Output this shift: No intake/output data recorded.  PE: General: pleasant, WD, obese female who is laying in bed in NAD HEENT:  Sclera are anicteric  Heart: regular, rate, and rhythm.   Lungs: CTAB, no wheezes, rhonchi, or rales noted.  Respiratory effort nonlabored Abd: soft, mildly ttp in epigastric abdomen, ND, +BS, no masses, hernias, or organomegaly Psych: A&Ox3 with an appropriate affect.   Lab Results:  Recent Labs    05/10/23 0826 05/11/23 0122  WBC 16.7* 13.0*  HGB 13.1 12.2  HCT 39.2 37.0  PLT 250 224   BMET Recent Labs    05/10/23 0826 05/11/23 0122  NA 137 134*  K 3.3* 3.5  CL 104 100  CO2 24 27  GLUCOSE 178* 132*  BUN 14 11  CREATININE 0.61 0.60  CALCIUM 8.3* 7.9*   PT/INR Recent Labs    05/10/23 0826  LABPROT 14.9  INR 1.1   CMP     Component Value Date/Time   NA 134 (L) 05/11/2023 0122   NA 140 04/27/2022 1420   K 3.5 05/11/2023 0122   CL 100 05/11/2023 0122   CO2 27 05/11/2023 0122   GLUCOSE 132 (H) 05/11/2023 0122   BUN 11 05/11/2023 0122   BUN 11 04/27/2022 1420   CREATININE 0.60 05/11/2023 0122   CREATININE 0.64 09/01/2016 1607   CALCIUM 7.9 (L) 05/11/2023 0122   PROT 5.3 (L) 05/11/2023 0122   ALBUMIN 2.6 (L) 05/11/2023 0122   AST 64 (H) 05/11/2023 0122   ALT 90 (H)  05/11/2023 0122   ALKPHOS 95 05/11/2023 0122   BILITOT 1.4 (H) 05/11/2023 0122   GFRNONAA >60 05/11/2023 0122   GFRNONAA >89 09/01/2016 1607   GFRAA 125 11/18/2020 1126   GFRAA >89 09/01/2016 1607   Lipase     Component Value Date/Time   LIPASE 364 (H) 05/11/2023 0122       Studies/Results: ECHOCARDIOGRAM COMPLETE  Result Date: 05/10/2023    ECHOCARDIOGRAM REPORT   Patient Name:   Debra Diaz [facility] Date of Exam: 05/10/2023 Medical Rec #:  [id number]                  Height:       63.0 in Accession #:    [id number]                 Weight:       209.0 lb Date of Birth:  11-Jun-1976                  BSA:          1.970 m Patient Age:    47 years                   BP:           116/70  mmHg Patient Gender: F                          HR:           54 bpm. Exam Location:  Inpatient Procedure: 2D Echo, Cardiac Doppler and Color Doppler Indications:    R01.1 Murmur  History:        Patient has prior history of Echocardiogram examinations, most                 recent 03/03/2022. Abnormal ECG, Arrythmias:Atrial Fibrillation,                 Atrial Flutter and Bradycardia; Risk Factors:Former Smoker and                 Diabetes. Ablation. Atrial myxoma resection.  Sonographer:    Sheralyn Boatman RDCS Referring Phys: [id number] Doreene Eland  Sonographer Comments: Image acquisition challenging due to patient body habitus. IMPRESSIONS  1. Left ventricular ejection fraction, by estimation, is 60 to 65%. The left ventricle has normal function. The left ventricle has no regional wall motion abnormalities. Left ventricular diastolic parameters were normal.  2. Right ventricular systolic function is normal. The right ventricular size is normal. There is normal pulmonary artery systolic pressure.  3. The mitral valve is normal in structure. Mild mitral valve regurgitation. No evidence of mitral stenosis.  4. Tricuspid valve regurgitation is mild to moderate.  5. The aortic valve is normal in structure. Aortic valve  regurgitation is not visualized. No aortic stenosis is present.  6. The inferior vena cava is normal in size with greater than 50% respiratory variability, suggesting right atrial pressure of 3 mmHg. FINDINGS  Left Ventricle: Left ventricular ejection fraction, by estimation, is 60 to 65%. The left ventricle has normal function. The left ventricle has no regional wall motion abnormalities. The left ventricular internal cavity size was normal in size. There is  no left ventricular hypertrophy. Left ventricular diastolic parameters were normal. Right Ventricle: The right ventricular size is normal. No increase in right ventricular wall thickness. Right ventricular systolic function is normal. There is normal pulmonary artery systolic pressure. The tricuspid regurgitant velocity is 2.50 m/s, and  with an assumed right atrial pressure of 8 mmHg, the estimated right ventricular systolic pressure is 33.0 mmHg. Left Atrium: Left atrial size was normal in size. Right Atrium: Right atrial size was normal in size. Pericardium: There is no evidence of pericardial effusion. Mitral Valve: The mitral valve is normal in structure. Mild mitral valve regurgitation. No evidence of mitral valve stenosis. Tricuspid Valve: The tricuspid valve is normal in structure. Tricuspid valve regurgitation is mild to moderate. No evidence of tricuspid stenosis. Aortic Valve: The aortic valve is normal in structure. Aortic valve regurgitation is not visualized. No aortic stenosis is present. Aortic valve mean gradient measures 7.0 mmHg. Aortic valve peak gradient measures 14.9 mmHg. Aortic valve area, by VTI measures 3.32 cm. Pulmonic Valve: The pulmonic valve was normal in structure. Pulmonic valve regurgitation is not visualized. No evidence of pulmonic stenosis. Aorta: The aortic root is normal in size and structure. Venous: The inferior vena cava is normal in size with greater than 50% respiratory variability, suggesting right atrial pressure  of 3 mmHg. IAS/Shunts: No atrial level shunt detected by color flow Doppler.  LEFT VENTRICLE PLAX 2D LVIDd:         5.10 cm      Diastology LVIDs:  3.30 cm      LV e' medial:    6.31 cm/s LV PW:         0.80 cm      LV E/e' medial:  14.7 LV IVS:        0.70 cm      LV e' lateral:   10.90 cm/s LVOT diam:     2.20 cm      LV E/e' lateral: 8.5 LV SV:         125 LV SV Index:   64 LVOT Area:     3.80 cm  LV Volumes (MOD) LV vol d, MOD A2C: 91.3 ml LV vol d, MOD A4C: 108.0 ml LV vol s, MOD A2C: 32.8 ml LV vol s, MOD A4C: 30.7 ml LV SV MOD A2C:     58.5 ml LV SV MOD A4C:     108.0 ml LV SV MOD BP:      69.9 ml RIGHT VENTRICLE             IVC RV S prime:     14.50 cm/s  IVC diam: 2.00 cm TAPSE (M-mode): 2.5 cm LEFT ATRIUM             Index        RIGHT ATRIUM           Index LA diam:        4.30 cm 2.18 cm/m   RA Area:     13.70 cm LA Vol (A2C):   59.7 ml 30.30 ml/m  RA Volume:   29.60 ml  15.02 ml/m LA Vol (A4C):   40.1 ml 20.35 ml/m LA Biplane Vol: 51.4 ml 26.09 ml/m  AORTIC VALVE AV Area (Vmax):    3.21 cm AV Area (Vmean):   3.00 cm AV Area (VTI):     3.32 cm AV Vmax:           193.00 cm/s AV Vmean:          119.000 cm/s AV VTI:            0.378 m AV Peak Grad:      14.9 mmHg AV Mean Grad:      7.0 mmHg LVOT Vmax:         163.00 cm/s LVOT Vmean:        93.800 cm/s LVOT VTI:          0.330 m LVOT/AV VTI ratio: 0.87  AORTA Ao Root diam: 3.50 cm Ao Asc diam:  3.60 cm MITRAL VALVE               TRICUSPID VALVE MV Area (PHT): 3.65 cm    TR Peak grad:   25.0 mmHg MV Decel Time: 208 msec    TR Vmax:        250.00 cm/s MV E velocity: 92.90 cm/s MV A velocity: 65.40 cm/s  SHUNTS MV E/A ratio:  1.42        Systemic VTI:  0.33 m                            Systemic Diam: 2.20 cm Aditya Sabharwal Electronically signed by Dorthula Nettles Signature Date/Time: 05/10/2023/3:41:03 PM    Final    MR ABDOMEN MRCP W WO CONTAST  Result Date: 05/10/2023 CLINICAL DATA:  Severe acute pancreatitis EXAM: MRI ABDOMEN WITHOUT  AND WITH CONTRAST (INCLUDING MRCP) TECHNIQUE: Multiplanar multisequence MR imaging of the abdomen was performed both  before and after the administration of intravenous contrast. Heavily T2-weighted images of the biliary and pancreatic ducts were obtained, and three-dimensional MRCP images were rendered by post processing. CONTRAST:  9mL GADAVIST GADOBUTROL 1 MMOL/ML IV SOLN COMPARISON:  Right upper quadrant ultrasound, 05/09/2023, CT abdomen pelvis, 05/09/2023 FINDINGS: Lower chest: Cardiomegaly.  Small bilateral pleural effusions. Hepatobiliary: No solid liver abnormality is seen. Fluid signal, subcapsular lesion of the posterior liver dome, hepatic segment VIII, with progressive discontinuous nodular contrast enhancement on multiphasic imaging, consistent with a definitively benign hepatic hemangioma, measuring 2.7 x 1.7 cm (series 4, image 11). Gallstone. Trace pericholecystic fluid without significant gallbladder wall thickening. No biliary ductal dilatation. Pancreas: Severe inflammatory fat stranding and fluid about the pancreas and adjacent retroperitoneum, particularly involving the body and tail. No parenchymal hypoenhancement or acute pancreatic fluid collection. No pancreatic ductal dilatation. Spleen: Normal in size without significant abnormality. Adrenals/Urinary Tract: Adrenal glands are unremarkable. Simple, benign bilateral renal cortical cysts, for which no further follow-up or characterization is required kidneys are otherwise normal, without renal calculi, solid lesion, or hydronephrosis. Stomach/Bowel: Stomach is within normal limits. No evidence of bowel wall thickening, distention, or inflammatory changes. Vascular/Lymphatic: No significant vascular findings are present. No enlarged abdominal lymph nodes. Other: No abdominal wall hernia or abnormality. Trace perihepatic ascites. Musculoskeletal: No acute or significant osseous findings. IMPRESSION: 1. Severe inflammatory fat stranding and  fluid about the pancreas and adjacent retroperitoneum, particularly involving the body and tail, consistent with acute pancreatitis. No evidence of complicating pancreatic necrosis or acute pancreatic fluid collection. Pancreatic ductal dilatation. 2. Cholelithiasis. Trace pericholecystic fluid without significant gallbladder wall thickening. 3. No biliary ductal dilatation. 4. Definitively benign hepatic hemangioma of the posterior liver dome, for which no further follow-up or characterization is required. 5. Cardiomegaly. Small bilateral pleural effusions. Electronically Signed   By: Jearld Lesch M.D.   On: 05/10/2023 15:00   MR 3D Recon At Scanner  Result Date: 05/10/2023 CLINICAL DATA:  Severe acute pancreatitis EXAM: MRI ABDOMEN WITHOUT AND WITH CONTRAST (INCLUDING MRCP) TECHNIQUE: Multiplanar multisequence MR imaging of the abdomen was performed both before and after the administration of intravenous contrast. Heavily T2-weighted images of the biliary and pancreatic ducts were obtained, and three-dimensional MRCP images were rendered by post processing. CONTRAST:  9mL GADAVIST GADOBUTROL 1 MMOL/ML IV SOLN COMPARISON:  Right upper quadrant ultrasound, 05/09/2023, CT abdomen pelvis, 05/09/2023 FINDINGS: Lower chest: Cardiomegaly.  Small bilateral pleural effusions. Hepatobiliary: No solid liver abnormality is seen. Fluid signal, subcapsular lesion of the posterior liver dome, hepatic segment VIII, with progressive discontinuous nodular contrast enhancement on multiphasic imaging, consistent with a definitively benign hepatic hemangioma, measuring 2.7 x 1.7 cm (series 4, image 11). Gallstone. Trace pericholecystic fluid without significant gallbladder wall thickening. No biliary ductal dilatation. Pancreas: Severe inflammatory fat stranding and fluid about the pancreas and adjacent retroperitoneum, particularly involving the body and tail. No parenchymal hypoenhancement or acute pancreatic fluid collection.  No pancreatic ductal dilatation. Spleen: Normal in size without significant abnormality. Adrenals/Urinary Tract: Adrenal glands are unremarkable. Simple, benign bilateral renal cortical cysts, for which no further follow-up or characterization is required kidneys are otherwise normal, without renal calculi, solid lesion, or hydronephrosis. Stomach/Bowel: Stomach is within normal limits. No evidence of bowel wall thickening, distention, or inflammatory changes. Vascular/Lymphatic: No significant vascular findings are present. No enlarged abdominal lymph nodes. Other: No abdominal wall hernia or abnormality. Trace perihepatic ascites. Musculoskeletal: No acute or significant osseous findings. IMPRESSION: 1. Severe inflammatory fat stranding and fluid about the pancreas  and adjacent retroperitoneum, particularly involving the body and tail, consistent with acute pancreatitis. No evidence of complicating pancreatic necrosis or acute pancreatic fluid collection. Pancreatic ductal dilatation. 2. Cholelithiasis. Trace pericholecystic fluid without significant gallbladder wall thickening. 3. No biliary ductal dilatation. 4. Definitively benign hepatic hemangioma of the posterior liver dome, for which no further follow-up or characterization is required. 5. Cardiomegaly. Small bilateral pleural effusions. Electronically Signed   By: Jearld Lesch M.D.   On: 05/10/2023 15:00   US Abdomen Limited RUQ (LIVER/GB)  Result Date: 05/09/2023 CLINICAL DATA:  Ascending cholangitis EXAM: ULTRASOUND ABDOMEN LIMITED RIGHT UPPER QUADRANT COMPARISON:  CT 05/09/2023 FINDINGS: Gallbladder: Gallstones. Upper normal wall thickness. Negative sonographic Murphy Common bile duct: Diameter: 3.2 mm Liver: Slightly echogenic liver parenchyma. No focal hepatic abnormality. Portal vein is patent on color Doppler imaging with normal direction of blood flow towards the liver. Other: None. IMPRESSION: Cholelithiasis without sonographic evidence for  acute cholecystitis. No biliary dilatation. Slightly echogenic liver parenchyma may be seen with hepatic steatosis. Electronically Signed   By: Jasmine Pang M.D.   On: 05/09/2023 23:29   CT ABDOMEN PELVIS W CONTRAST  Result Date: 05/09/2023 CLINICAL DATA:  Chest and abdominal pain, diaphoresis EXAM: CT ANGIOGRAPHY CHEST CT ABDOMEN AND PELVIS WITH CONTRAST TECHNIQUE: Multidetector CT imaging of the chest was performed using the standard protocol during bolus administration of intravenous contrast. Multiplanar CT image reconstructions and MIPs were obtained to evaluate the vascular anatomy. Multidetector CT imaging of the abdomen and pelvis was performed using the standard protocol during bolus administration of intravenous contrast. RADIATION DOSE REDUCTION: This exam was performed according to the departmental dose-optimization program which includes automated exposure control, adjustment of the mA and/or kV according to patient size and/or use of iterative reconstruction technique. CONTRAST:  75mL OMNIPAQUE IOHEXOL 350 MG/ML SOLN COMPARISON:  05/09/2023, 02/14/2022 FINDINGS: CTA CHEST FINDINGS Cardiovascular: This is a technically adequate evaluation of the pulmonary vasculature. No filling defects or pulmonary emboli. Dilated main pulmonary artery measuring up to 4.3 cm consistent with pulmonary arterial hypertension. Heart is unremarkable without pericardial effusion. Atherosclerosis of the coronary vasculature greatest in the LAD distribution. Normal caliber of the thoracic aorta. Mediastinum/Nodes: No enlarged mediastinal, hilar, or axillary lymph nodes. Thyroid gland, trachea, and esophagus demonstrate no significant findings. Lungs/Pleura: No acute airspace disease, effusion, or pneumothorax. Central airways are patent. Musculoskeletal: No acute or destructive bony lesions. Reconstructed images demonstrate no additional findings. Review of the MIP images confirms the above findings. CT ABDOMEN and PELVIS  FINDINGS Hepatobiliary: 2.5 cm hypodensity posterior right lobe liver reference image 8/12 demonstrates early centripetal enhancement, stable since prior study and likely representing benign etiology such as hemangioma. The remainder of the liver is unremarkable. The gallbladder is normal. Pancreas: Pancreatic parenchymal edema and significant peripancreatic fat stranding consistent with acute uncomplicated pancreatitis. No fluid collection, pseudocyst, or abscess. Spleen: Normal in size without focal abnormality. Adrenals/Urinary Tract: 1.5 cm right renal cortical cyst is not require specific imaging follow-up. No urinary tract calculi or obstructive uropathy. The adrenals and bladder are unremarkable. Stomach/Bowel: No bowel obstruction or ileus. Normal appendix right lower quadrant. No bowel wall thickening or inflammatory change. Vascular/Lymphatic: No significant vascular findings are present. No enlarged abdominal or pelvic lymph nodes. Reproductive: Uterus and bilateral adnexa are unremarkable. Other: Central mesenteric edema and trace free fluid within the bilateral flanks consistent with pancreatitis. No free intraperitoneal gas. No abdominal wall hernia. Musculoskeletal: No acute or destructive bony abnormalities. Reconstructed images demonstrate no additional findings. Review of the  MIP images confirms the above findings. IMPRESSION: Chest: 1. No evidence of pulmonary embolus. 2. Dilated main pulmonary artery consistent with pulmonary arterial hypertension. 3. Coronary artery atherosclerosis. Abdomen/pelvis: 1. Acute uncomplicated pancreatitis. No fluid collection, pseudocyst, or abscess. 2. Indeterminate 2.5 cm hypodensity right lobe liver, favor benign etiology such as hemangioma. If definitive characterization is desired, nonemergent outpatient liver MRI could be considered. 3. Trace reactive ascites. Electronically Signed   By: Sharlet Salina M.D.   On: 05/09/2023 22:31   CT Angio Chest PE W and/or  Wo Contrast  Result Date: 05/09/2023 CLINICAL DATA:  Chest and abdominal pain, diaphoresis EXAM: CT ANGIOGRAPHY CHEST CT ABDOMEN AND PELVIS WITH CONTRAST TECHNIQUE: Multidetector CT imaging of the chest was performed using the standard protocol during bolus administration of intravenous contrast. Multiplanar CT image reconstructions and MIPs were obtained to evaluate the vascular anatomy. Multidetector CT imaging of the abdomen and pelvis was performed using the standard protocol during bolus administration of intravenous contrast. RADIATION DOSE REDUCTION: This exam was performed according to the departmental dose-optimization program which includes automated exposure control, adjustment of the mA and/or kV according to patient size and/or use of iterative reconstruction technique. CONTRAST:  75mL OMNIPAQUE IOHEXOL 350 MG/ML SOLN COMPARISON:  05/09/2023, 02/14/2022 FINDINGS: CTA CHEST FINDINGS Cardiovascular: This is a technically adequate evaluation of the pulmonary vasculature. No filling defects or pulmonary emboli. Dilated main pulmonary artery measuring up to 4.3 cm consistent with pulmonary arterial hypertension. Heart is unremarkable without pericardial effusion. Atherosclerosis of the coronary vasculature greatest in the LAD distribution. Normal caliber of the thoracic aorta. Mediastinum/Nodes: No enlarged mediastinal, hilar, or axillary lymph nodes. Thyroid gland, trachea, and esophagus demonstrate no significant findings. Lungs/Pleura: No acute airspace disease, effusion, or pneumothorax. Central airways are patent. Musculoskeletal: No acute or destructive bony lesions. Reconstructed images demonstrate no additional findings. Review of the MIP images confirms the above findings. CT ABDOMEN and PELVIS FINDINGS Hepatobiliary: 2.5 cm hypodensity posterior right lobe liver reference image 8/12 demonstrates early centripetal enhancement, stable since prior study and likely representing benign etiology such  as hemangioma. The remainder of the liver is unremarkable. The gallbladder is normal. Pancreas: Pancreatic parenchymal edema and significant peripancreatic fat stranding consistent with acute uncomplicated pancreatitis. No fluid collection, pseudocyst, or abscess. Spleen: Normal in size without focal abnormality. Adrenals/Urinary Tract: 1.5 cm right renal cortical cyst is not require specific imaging follow-up. No urinary tract calculi or obstructive uropathy. The adrenals and bladder are unremarkable. Stomach/Bowel: No bowel obstruction or ileus. Normal appendix right lower quadrant. No bowel wall thickening or inflammatory change. Vascular/Lymphatic: No significant vascular findings are present. No enlarged abdominal or pelvic lymph nodes. Reproductive: Uterus and bilateral adnexa are unremarkable. Other: Central mesenteric edema and trace free fluid within the bilateral flanks consistent with pancreatitis. No free intraperitoneal gas. No abdominal wall hernia. Musculoskeletal: No acute or destructive bony abnormalities. Reconstructed images demonstrate no additional findings. Review of the MIP images confirms the above findings. IMPRESSION: Chest: 1. No evidence of pulmonary embolus. 2. Dilated main pulmonary artery consistent with pulmonary arterial hypertension. 3. Coronary artery atherosclerosis. Abdomen/pelvis: 1. Acute uncomplicated pancreatitis. No fluid collection, pseudocyst, or abscess. 2. Indeterminate 2.5 cm hypodensity right lobe liver, favor benign etiology such as hemangioma. If definitive characterization is desired, nonemergent outpatient liver MRI could be considered. 3. Trace reactive ascites. Electronically Signed   By: Sharlet Salina M.D.   On: 05/09/2023 22:31   DG Chest Portable 1 View  Result Date: 05/09/2023 CLINICAL DATA:  Chest and epigastric pain  EXAM: PORTABLE CHEST 1 VIEW COMPARISON:  11/11/2022 FINDINGS: Low lung volumes are present, causing crowding of the pulmonary vasculature.  Heart size within normal limits. The lungs appear clear. No blunting of the costophrenic angles. No significant bony abnormality observed. IMPRESSION: 1. No active cardiopulmonary disease is radiographically apparent. 2. Low lung volumes. Electronically Signed   By: Gaylyn Rong M.D.   On: 05/09/2023 20:11    Anti-infectives: Anti-infectives (From admission, onward)    Start     Dose/Rate Route Frequency Ordered Stop   05/10/23 1000  vancomycin (VANCOCIN) IVPB 750 mg/150 ml premix  Status:  Discontinued        750 mg 150 mL/hr over 60 Minutes Intravenous Every 12 hours 05/09/23 2253 05/10/23 0634   05/10/23 0600  piperacillin-tazobactam (ZOSYN) IVPB 3.375 g  Status:  Discontinued        3.375 g 12.5 mL/hr over 240 Minutes Intravenous Every 8 hours 05/09/23 2248 05/10/23 0634   05/09/23 2300  piperacillin-tazobactam (ZOSYN) IVPB 3.375 g        3.375 g 100 mL/hr over 30 Minutes Intravenous  Once 05/09/23 2248 05/09/23 2325   05/09/23 2300  vancomycin (VANCOCIN) 1,750 mg in sodium chloride 0.9 % 500 mL IVPB        1,750 mg 258.8 mL/hr over 120 Minutes Intravenous  Once 05/09/23 2248 05/10/23 0149        Assessment/Plan  Biliary pancreatitis  - CT with mild pancreatitis without any complicating features - [place] with cholelithiasis but no signs of cholecystitis  - lipase 3900 on admit and down to 364 this AM - WBC 13, afebrile - MRCP did not show choledocholithiasis and Tbili stable at 1.4, LFTs otherwise trending down as well - still a little ttp today but I think may be ready for lap chole as early as tomorrow. Will make NPO after MN in case and check labs in AM   FEN: HH diet, IVF per family medicine service VTE: Hold eliquis ID: [email or web address], no abx needed from surgery standpoint    LOS: 1 day   I reviewed Consultant GI notes, hospitalist notes, last 24 h vitals and pain scores, last 48 h intake and output, last 24 h labs and trends, and last 24 h imaging results.   Juliet Rude, [facility] 05/11/2023, 9:26 AM Please see Amion for pager number during day hours 7:00am-4:30pm
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
## 2023-05-12 DIAGNOSIS — K859 Acute pancreatitis without necrosis or infection, unspecified: Secondary | ICD-10-CM | POA: Diagnosis not present

## 2023-05-12 DIAGNOSIS — R7989 Other specified abnormal findings of blood chemistry: Secondary | ICD-10-CM | POA: Diagnosis not present

## 2023-05-12 DIAGNOSIS — K8020 Calculus of gallbladder without cholecystitis without obstruction: Secondary | ICD-10-CM | POA: Diagnosis not present

## 2023-05-12 LAB — MAGNESIUM: Magnesium: 1.9 mg/dL (ref 1.7–2.4)

## 2023-05-12 LAB — CBC
HCT: 34.3 % — ABNORMAL LOW (ref 36.0–46.0)
Hemoglobin: 11.5 g/dL — ABNORMAL LOW (ref 12.0–15.0)
MCH: 29.6 pg (ref 26.0–34.0)
MCHC: 33.5 g/dL (ref 30.0–36.0)
MCV: 88.2 fL (ref 80.0–100.0)
Platelets: 201 10*3/uL (ref 150–400)
RBC: 3.89 MIL/uL (ref 3.87–5.11)
RDW: 13.8 % (ref 11.5–15.5)
WBC: 10.8 10*3/uL — ABNORMAL HIGH (ref 4.0–10.5)
nRBC: 0 % (ref 0.0–0.2)

## 2023-05-12 LAB — COMPREHENSIVE METABOLIC PANEL
ALT: 55 U/L — ABNORMAL HIGH (ref 0–44)
AST: 25 U/L (ref 15–41)
Albumin: 2.5 g/dL — ABNORMAL LOW (ref 3.5–5.0)
Alkaline Phosphatase: 84 U/L (ref 38–126)
Anion gap: 8 (ref 5–15)
BUN: 6 mg/dL (ref 6–20)
CO2: 26 mmol/L (ref 22–32)
Calcium: 7.8 mg/dL — ABNORMAL LOW (ref 8.9–10.3)
Chloride: 102 mmol/L (ref 98–111)
Creatinine, Ser: 0.64 mg/dL (ref 0.44–1.00)
GFR, Estimated: >60 mL/min (ref >60)
Glucose, Bld: 119 mg/dL — ABNORMAL HIGH (ref 70–99)
Potassium: 3.3 mmol/L — ABNORMAL LOW (ref 3.5–5.1)
Sodium: 136 mmol/L (ref 135–145)
Total Bilirubin: 1.3 mg/dL — ABNORMAL HIGH (ref 0.3–1.2)
Total Protein: 5.3 g/dL — ABNORMAL LOW (ref 6.5–8.1)

## 2023-05-12 LAB — SURGICAL PCR SCREEN
MRSA, PCR: NEGATIVE
Staphylococcus aureus: NEGATIVE

## 2023-05-12 LAB — GLUCOSE, CAPILLARY
Glucose-Capillary: 134 mg/dL — ABNORMAL HIGH (ref 70–99)
Glucose-Capillary: 178 mg/dL — ABNORMAL HIGH (ref 70–99)
Glucose-Capillary: 155 mg/dL — ABNORMAL HIGH (ref 70–99)
Glucose-Capillary: 138 mg/dL — ABNORMAL HIGH (ref 70–99)

## 2023-05-12 LAB — LIPASE, BLOOD: Lipase: 103 U/L — ABNORMAL HIGH (ref 11–51)

## 2023-05-12 MED ORDER — METRONIDAZOLE 500 MG/100ML IV SOLN
500.0000 mg | Freq: Two times a day (BID) | INTRAVENOUS | Status: DC
  Administered 2023-05-12 – 2023-05-13 (×3): 500 mg via INTRAVENOUS
  Filled 2023-05-12 (×3): qty 100

## 2023-05-12 MED ORDER — POLYETHYLENE GLYCOL 3350 17 G PO PACK: 17.0000 g | PACK | Freq: Every day | ORAL | Status: DC

## 2023-05-12 MED ORDER — ACETAMINOPHEN 325 MG PO TABS
650.0000 mg | ORAL_TABLET | Freq: Four times a day (QID) | ORAL | Status: DC | PRN
  Administered 2023-05-12 – 2023-05-14 (×2): 650 mg via ORAL
  Filled 2023-05-12 (×2): qty 2

## 2023-05-12 MED ORDER — SODIUM CHLORIDE 0.9 % IV SOLN
2.0000 g | INTRAVENOUS | Status: AC
  Administered 2023-05-13: 2 g via INTRAVENOUS
  Filled 2023-05-12: qty 20

## 2023-05-12 MED ORDER — SODIUM CHLORIDE 0.9 % IV SOLN
2.0000 g | INTRAVENOUS | Status: AC
  Administered 2023-05-12: 2 g via INTRAVENOUS
  Filled 2023-05-12: qty 20

## 2023-05-12 MED ORDER — POLYETHYLENE GLYCOL 3350 17 G PO PACK
17.0000 g | PACK | Freq: Every day | ORAL | Status: DC
  Administered 2023-05-12 – 2023-05-14 (×2): 17 g via ORAL
  Filled 2023-05-12 (×2): qty 1

## 2023-05-12 MED ORDER — POTASSIUM CHLORIDE 10 MEQ/100ML IV SOLN
10.0000 meq | INTRAVENOUS | Status: AC
  Administered 2023-05-12 (×2): 10 meq via INTRAVENOUS
  Filled 2023-05-12 (×3): qty 100

## 2023-05-12 MED ORDER — ENOXAPARIN SODIUM 100 MG/ML IJ SOSY
95.0000 mg | PREFILLED_SYRINGE | Freq: Once | INTRAMUSCULAR | Status: AC
  Administered 2023-05-12: 95 mg via SUBCUTANEOUS
  Filled 2023-05-12: qty 0.95

## 2023-05-12 MED ORDER — SODIUM CHLORIDE 0.9 % IV SOLN
500.0000 mg | INTRAVENOUS | Status: DC
  Administered 2023-05-12: 500 mg via INTRAVENOUS
  Filled 2023-05-12 (×3): qty 5

## 2023-05-12 MED ORDER — POTASSIUM CHLORIDE 10 MEQ/100ML IV SOLN
10.0000 meq | INTRAVENOUS | Status: AC
  Administered 2023-05-12 (×2): 10 meq via INTRAVENOUS
  Filled 2023-05-12 (×2): qty 100

## 2023-05-12 MED ORDER — SENNOSIDES-DOCUSATE SODIUM 8.6-50 MG PO TABS
1.0000 | ORAL_TABLET | Freq: Every day | ORAL | Status: DC
  Administered 2023-05-12 – 2023-05-13 (×2): 1 via ORAL
  Filled 2023-05-12 (×2): qty 1
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
## 2023-05-12 NOTE — Progress Notes (Signed)
Progress Note     Subjective: No pain today.  Tolerating low fat diet with no pain or nausea.  Objective: Vital signs in last 24 hours: Temp:  [98.4 F (36.9 C)-100.6 F (38.1 C)] 98.4 F (36.9 C) (05/25 0807) Pulse Rate:  [50-59] 50 (05/25 0807) Resp:  [16-18] 17 (05/25 0807) BP: (105-144)/(58-75) 133/75 (05/25 0807) SpO2:  [98 %-99 %] 98 % (05/25 0807) Last BM Date : 05/09/23  Intake/Output from previous day: 05/24 0701 - 05/25 0700 In: 480 [P.O.:480] Out: -  Intake/Output this shift: No intake/output data recorded.  PE: Abd: soft, NT, Nd, +BS   Lab Results:  Recent Labs    05/11/23 0122 05/12/23 0133  WBC 13.0* 10.8*  HGB 12.2 11.5*  HCT 37.0 34.3*  PLT 224 201   BMET Recent Labs    05/11/23 0122 05/12/23 0133  NA 134* 136  K 3.5 3.3*  CL 100 102  CO2 27 26  GLUCOSE 132* 119*  BUN 11 6  CREATININE 0.60 0.64  CALCIUM 7.9* 7.8*   PT/INR Recent Labs    05/10/23 0826  LABPROT 14.9  INR 1.1   CMP     Component Value Date/Time   NA 136 05/12/2023 0133   NA 140 04/27/2022 1420   K 3.3 (L) 05/12/2023 0133   CL 102 05/12/2023 0133   CO2 26 05/12/2023 0133   GLUCOSE 119 (H) 05/12/2023 0133   BUN 6 05/12/2023 0133   BUN 11 04/27/2022 1420   CREATININE 0.64 05/12/2023 0133   CREATININE 0.64 09/01/2016 1607   CALCIUM 7.8 (L) 05/12/2023 0133   PROT 5.3 (L) 05/12/2023 0133   ALBUMIN 2.5 (L) 05/12/2023 0133   AST 25 05/12/2023 0133   ALT 55 (H) 05/12/2023 0133   ALKPHOS 84 05/12/2023 0133   BILITOT 1.3 (H) 05/12/2023 0133   GFRNONAA >60 05/12/2023 0133   GFRNONAA >89 09/01/2016 1607   GFRAA 125 11/18/2020 1126   GFRAA >89 09/01/2016 1607   Lipase     Component Value Date/Time   LIPASE 103 (H) 05/12/2023 0133       Studies/Results: ECHOCARDIOGRAM COMPLETE  Result Date: 05/10/2023    ECHOCARDIOGRAM REPORT   Patient Name:   Debra Diaz [facility] Date of Exam: 05/10/2023 Medical Rec #:  [id number]                  Height:        63.0 in Accession #:    [id number]                 Weight:       209.0 lb Date of Birth:  March 16, 1976                  BSA:          1.970 m Patient Age:    47 years                   BP:           116/70 mmHg Patient Gender: F                          HR:           54 bpm. Exam Location:  Inpatient Procedure: 2D Echo, Cardiac Doppler and Color Doppler Indications:    R01.1 Murmur  History:        Patient has prior history of Echocardiogram examinations, most  recent 03/03/2022. Abnormal ECG, Arrythmias:Atrial Fibrillation,                 Atrial Flutter and Bradycardia; Risk Factors:Former Smoker and                 Diabetes. Ablation. Atrial myxoma resection.  Sonographer:    Sheralyn Boatman RDCS Referring Phys: [id number] Doreene Eland  Sonographer Comments: Image acquisition challenging due to patient body habitus. IMPRESSIONS  1. Left ventricular ejection fraction, by estimation, is 60 to 65%. The left ventricle has normal function. The left ventricle has no regional wall motion abnormalities. Left ventricular diastolic parameters were normal.  2. Right ventricular systolic function is normal. The right ventricular size is normal. There is normal pulmonary artery systolic pressure.  3. The mitral valve is normal in structure. Mild mitral valve regurgitation. No evidence of mitral stenosis.  4. Tricuspid valve regurgitation is mild to moderate.  5. The aortic valve is normal in structure. Aortic valve regurgitation is not visualized. No aortic stenosis is present.  6. The inferior vena cava is normal in size with greater than 50% respiratory variability, suggesting right atrial pressure of 3 mmHg. FINDINGS  Left Ventricle: Left ventricular ejection fraction, by estimation, is 60 to 65%. The left ventricle has normal function. The left ventricle has no regional wall motion abnormalities. The left ventricular internal cavity size was normal in size. There is  no left ventricular hypertrophy. Left ventricular  diastolic parameters were normal. Right Ventricle: The right ventricular size is normal. No increase in right ventricular wall thickness. Right ventricular systolic function is normal. There is normal pulmonary artery systolic pressure. The tricuspid regurgitant velocity is 2.50 m/s, and  with an assumed right atrial pressure of 8 mmHg, the estimated right ventricular systolic pressure is 33.0 mmHg. Left Atrium: Left atrial size was normal in size. Right Atrium: Right atrial size was normal in size. Pericardium: There is no evidence of pericardial effusion. Mitral Valve: The mitral valve is normal in structure. Mild mitral valve regurgitation. No evidence of mitral valve stenosis. Tricuspid Valve: The tricuspid valve is normal in structure. Tricuspid valve regurgitation is mild to moderate. No evidence of tricuspid stenosis. Aortic Valve: The aortic valve is normal in structure. Aortic valve regurgitation is not visualized. No aortic stenosis is present. Aortic valve mean gradient measures 7.0 mmHg. Aortic valve peak gradient measures 14.9 mmHg. Aortic valve area, by VTI measures 3.32 cm. Pulmonic Valve: The pulmonic valve was normal in structure. Pulmonic valve regurgitation is not visualized. No evidence of pulmonic stenosis. Aorta: The aortic root is normal in size and structure. Venous: The inferior vena cava is normal in size with greater than 50% respiratory variability, suggesting right atrial pressure of 3 mmHg. IAS/Shunts: No atrial level shunt detected by color flow Doppler.  LEFT VENTRICLE PLAX 2D LVIDd:         5.10 cm      Diastology LVIDs:         3.30 cm      LV e' medial:    6.31 cm/s LV PW:         0.80 cm      LV E/e' medial:  14.7 LV IVS:        0.70 cm      LV e' lateral:   10.90 cm/s LVOT diam:     2.20 cm      LV E/e' lateral: 8.5 LV SV:         125 LV SV  Index:   64 LVOT Area:     3.80 cm  LV Volumes (MOD) LV vol d, MOD A2C: 91.3 ml LV vol d, MOD A4C: 108.0 ml LV vol s, MOD A2C: 32.8 ml LV  vol s, MOD A4C: 30.7 ml LV SV MOD A2C:     58.5 ml LV SV MOD A4C:     108.0 ml LV SV MOD BP:      69.9 ml RIGHT VENTRICLE             IVC RV S prime:     14.50 cm/s  IVC diam: 2.00 cm TAPSE (M-mode): 2.5 cm LEFT ATRIUM             Index        RIGHT ATRIUM           Index LA diam:        4.30 cm 2.18 cm/m   RA Area:     13.70 cm LA Vol (A2C):   59.7 ml 30.30 ml/m  RA Volume:   29.60 ml  15.02 ml/m LA Vol (A4C):   40.1 ml 20.35 ml/m LA Biplane Vol: 51.4 ml 26.09 ml/m  AORTIC VALVE AV Area (Vmax):    3.21 cm AV Area (Vmean):   3.00 cm AV Area (VTI):     3.32 cm AV Vmax:           193.00 cm/s AV Vmean:          119.000 cm/s AV VTI:            0.378 m AV Peak Grad:      14.9 mmHg AV Mean Grad:      7.0 mmHg LVOT Vmax:         163.00 cm/s LVOT Vmean:        93.800 cm/s LVOT VTI:          0.330 m LVOT/AV VTI ratio: 0.87  AORTA Ao Root diam: 3.50 cm Ao Asc diam:  3.60 cm MITRAL VALVE               TRICUSPID VALVE MV Area (PHT): 3.65 cm    TR Peak grad:   25.0 mmHg MV Decel Time: 208 msec    TR Vmax:        250.00 cm/s MV E velocity: 92.90 cm/s MV A velocity: 65.40 cm/s  SHUNTS MV E/A ratio:  1.42        Systemic VTI:  0.33 m                            Systemic Diam: 2.20 cm Aditya Sabharwal Electronically signed by Dorthula Nettles Signature Date/Time: 05/10/2023/3:41:03 PM    Final    MR ABDOMEN MRCP W WO CONTAST  Result Date: 05/10/2023 CLINICAL DATA:  Severe acute pancreatitis EXAM: MRI ABDOMEN WITHOUT AND WITH CONTRAST (INCLUDING MRCP) TECHNIQUE: Multiplanar multisequence MR imaging of the abdomen was performed both before and after the administration of intravenous contrast. Heavily T2-weighted images of the biliary and pancreatic ducts were obtained, and three-dimensional MRCP images were rendered by post processing. CONTRAST:  9mL GADAVIST GADOBUTROL 1 MMOL/ML IV SOLN COMPARISON:  Right upper quadrant ultrasound, 05/09/2023, CT abdomen pelvis, 05/09/2023 FINDINGS: Lower chest: Cardiomegaly.  Small  bilateral pleural effusions. Hepatobiliary: No solid liver abnormality is seen. Fluid signal, subcapsular lesion of the posterior liver dome, hepatic segment VIII, with progressive discontinuous nodular contrast enhancement on multiphasic imaging, consistent with a definitively  benign hepatic hemangioma, measuring 2.7 x 1.7 cm (series 4, image 11). Gallstone. Trace pericholecystic fluid without significant gallbladder wall thickening. No biliary ductal dilatation. Pancreas: Severe inflammatory fat stranding and fluid about the pancreas and adjacent retroperitoneum, particularly involving the body and tail. No parenchymal hypoenhancement or acute pancreatic fluid collection. No pancreatic ductal dilatation. Spleen: Normal in size without significant abnormality. Adrenals/Urinary Tract: Adrenal glands are unremarkable. Simple, benign bilateral renal cortical cysts, for which no further follow-up or characterization is required kidneys are otherwise normal, without renal calculi, solid lesion, or hydronephrosis. Stomach/Bowel: Stomach is within normal limits. No evidence of bowel wall thickening, distention, or inflammatory changes. Vascular/Lymphatic: No significant vascular findings are present. No enlarged abdominal lymph nodes. Other: No abdominal wall hernia or abnormality. Trace perihepatic ascites. Musculoskeletal: No acute or significant osseous findings. IMPRESSION: 1. Severe inflammatory fat stranding and fluid about the pancreas and adjacent retroperitoneum, particularly involving the body and tail, consistent with acute pancreatitis. No evidence of complicating pancreatic necrosis or acute pancreatic fluid collection. Pancreatic ductal dilatation. 2. Cholelithiasis. Trace pericholecystic fluid without significant gallbladder wall thickening. 3. No biliary ductal dilatation. 4. Definitively benign hepatic hemangioma of the posterior liver dome, for which no further follow-up or characterization is required.  5. Cardiomegaly. Small bilateral pleural effusions. Electronically Signed   By: Jearld Lesch M.D.   On: 05/10/2023 15:00   MR 3D Recon At Scanner  Result Date: 05/10/2023 CLINICAL DATA:  Severe acute pancreatitis EXAM: MRI ABDOMEN WITHOUT AND WITH CONTRAST (INCLUDING MRCP) TECHNIQUE: Multiplanar multisequence MR imaging of the abdomen was performed both before and after the administration of intravenous contrast. Heavily T2-weighted images of the biliary and pancreatic ducts were obtained, and three-dimensional MRCP images were rendered by post processing. CONTRAST:  9mL GADAVIST GADOBUTROL 1 MMOL/ML IV SOLN COMPARISON:  Right upper quadrant ultrasound, 05/09/2023, CT abdomen pelvis, 05/09/2023 FINDINGS: Lower chest: Cardiomegaly.  Small bilateral pleural effusions. Hepatobiliary: No solid liver abnormality is seen. Fluid signal, subcapsular lesion of the posterior liver dome, hepatic segment VIII, with progressive discontinuous nodular contrast enhancement on multiphasic imaging, consistent with a definitively benign hepatic hemangioma, measuring 2.7 x 1.7 cm (series 4, image 11). Gallstone. Trace pericholecystic fluid without significant gallbladder wall thickening. No biliary ductal dilatation. Pancreas: Severe inflammatory fat stranding and fluid about the pancreas and adjacent retroperitoneum, particularly involving the body and tail. No parenchymal hypoenhancement or acute pancreatic fluid collection. No pancreatic ductal dilatation. Spleen: Normal in size without significant abnormality. Adrenals/Urinary Tract: Adrenal glands are unremarkable. Simple, benign bilateral renal cortical cysts, for which no further follow-up or characterization is required kidneys are otherwise normal, without renal calculi, solid lesion, or hydronephrosis. Stomach/Bowel: Stomach is within normal limits. No evidence of bowel wall thickening, distention, or inflammatory changes. Vascular/Lymphatic: No significant vascular  findings are present. No enlarged abdominal lymph nodes. Other: No abdominal wall hernia or abnormality. Trace perihepatic ascites. Musculoskeletal: No acute or significant osseous findings. IMPRESSION: 1. Severe inflammatory fat stranding and fluid about the pancreas and adjacent retroperitoneum, particularly involving the body and tail, consistent with acute pancreatitis. No evidence of complicating pancreatic necrosis or acute pancreatic fluid collection. Pancreatic ductal dilatation. 2. Cholelithiasis. Trace pericholecystic fluid without significant gallbladder wall thickening. 3. No biliary ductal dilatation. 4. Definitively benign hepatic hemangioma of the posterior liver dome, for which no further follow-up or characterization is required. 5. Cardiomegaly. Small bilateral pleural effusions. Electronically Signed   By: Jearld Lesch M.D.   On: 05/10/2023 15:00    Anti-infectives: Anti-infectives (From admission, onward)  Start     Dose/Rate Route Frequency Ordered Stop   05/12/23 0600  cefTRIAXone (ROCEPHIN) 2 g in sodium chloride 0.9 % 100 mL IVPB        2 g 200 mL/hr over 30 Minutes Intravenous On call to O.R. 05/11/23 1534 05/13/23 0559   05/10/23 1000  vancomycin (VANCOCIN) IVPB 750 mg/150 ml premix  Status:  Discontinued        750 mg 150 mL/hr over 60 Minutes Intravenous Every 12 hours 05/09/23 2253 05/10/23 0634   05/10/23 0600  piperacillin-tazobactam (ZOSYN) IVPB 3.375 g  Status:  Discontinued        3.375 g 12.5 mL/hr over 240 Minutes Intravenous Every 8 hours 05/09/23 2248 05/10/23 0634   05/09/23 2300  piperacillin-tazobactam (ZOSYN) IVPB 3.375 g        3.375 g 100 mL/hr over 30 Minutes Intravenous  Once 05/09/23 2248 05/09/23 2325   05/09/23 2300  vancomycin (VANCOCIN) 1,750 mg in sodium chloride 0.9 % 500 mL IVPB        1,750 mg 258.8 mL/hr over 120 Minutes Intravenous  Once 05/09/23 2248 05/10/23 0149        Assessment/Plan  Biliary pancreatitis  - CT with mild  pancreatitis without any complicating features - [place] with cholelithiasis but no signs of cholecystitis  - lipase 103, but clinically pancreatitis has resolved - WBC nl - MRCP did not show choledocholithiasis and Tbili stable at 1.3, LFTs otherwise trending down as well - will allow diet again today and plan for lap chole tomorrow, again, depending on OR availability   FEN: HH diet, NPO p MN, IVF per family medicine service VTE: Hold eliquis ID: rocephin on call to OR  LOS: 2 days   I reviewed hospitalist notes, last 24 h vitals and pain scores, last 48 h intake and output, last 24 h labs and trends, and last 24 h imaging results.   Letha Cape, [facility] 05/12/2023, 10:07 AM Please see Amion for pager number during day hours 7:00am-4:30pm

## 2023-05-13 ENCOUNTER — Inpatient Hospital Stay (HOSPITAL_COMMUNITY): Payer: Medicaid Other | Admitting: Certified Registered Nurse Anesthetist

## 2023-05-13 ENCOUNTER — Encounter (HOSPITAL_COMMUNITY): Payer: Self-pay | Admitting: Family Medicine

## 2023-05-13 ENCOUNTER — Encounter (HOSPITAL_COMMUNITY)
Admission: EM | Disposition: A | Discharge status: Home / Self Care | Payer: Self-pay | Source: Home / Self Care | Attending: Family Medicine

## 2023-05-13 ENCOUNTER — Other Ambulatory Visit: Payer: Self-pay

## 2023-05-13 ENCOUNTER — Inpatient Hospital Stay (HOSPITAL_COMMUNITY): Payer: Medicaid Other

## 2023-05-13 DIAGNOSIS — Z87891 Personal history of nicotine dependence: Secondary | ICD-10-CM

## 2023-05-13 DIAGNOSIS — K851 Biliary acute pancreatitis without necrosis or infection: Secondary | ICD-10-CM

## 2023-05-13 DIAGNOSIS — R001 Bradycardia, unspecified: Secondary | ICD-10-CM | POA: Diagnosis not present

## 2023-05-13 DIAGNOSIS — I10 Essential (primary) hypertension: Secondary | ICD-10-CM

## 2023-05-13 DIAGNOSIS — D649 Anemia, unspecified: Secondary | ICD-10-CM

## 2023-05-13 DIAGNOSIS — Z609 Problem related to social environment, unspecified: Secondary | ICD-10-CM | POA: Diagnosis not present

## 2023-05-13 HISTORY — PX: CHOLECYSTECTOMY: SHX55

## 2023-05-13 LAB — CBC
HCT: 34 % — ABNORMAL LOW (ref 36.0–46.0)
Hemoglobin: 11 g/dL — ABNORMAL LOW (ref 12.0–15.0)
MCH: 28.4 pg (ref 26.0–34.0)
MCHC: 32.4 g/dL (ref 30.0–36.0)
MCV: 87.6 fL (ref 80.0–100.0)
Platelets: 219 10*3/uL (ref 150–400)
RBC: 3.88 MIL/uL (ref 3.87–5.11)
RDW: 13.8 % (ref 11.5–15.5)
WBC: 10 10*3/uL (ref 4.0–10.5)
nRBC: 0 % (ref 0.0–0.2)

## 2023-05-13 LAB — COMPREHENSIVE METABOLIC PANEL
ALT: 43 U/L (ref 0–44)
AST: 19 U/L (ref 15–41)
Albumin: 2.4 g/dL — ABNORMAL LOW (ref 3.5–5.0)
Alkaline Phosphatase: 95 U/L (ref 38–126)
Anion gap: 6 (ref 5–15)
BUN: 6 mg/dL (ref 6–20)
CO2: 24 mmol/L (ref 22–32)
Calcium: 7.7 mg/dL — ABNORMAL LOW (ref 8.9–10.3)
Chloride: 106 mmol/L (ref 98–111)
Creatinine, Ser: 0.54 mg/dL (ref 0.44–1.00)
GFR, Estimated: >60 mL/min (ref >60)
Glucose, Bld: 117 mg/dL — ABNORMAL HIGH (ref 70–99)
Potassium: 3.4 mmol/L — ABNORMAL LOW (ref 3.5–5.1)
Sodium: 136 mmol/L (ref 135–145)
Total Bilirubin: 1 mg/dL (ref 0.3–1.2)
Total Protein: 5.5 g/dL — ABNORMAL LOW (ref 6.5–8.1)

## 2023-05-13 LAB — GLUCOSE, CAPILLARY
Glucose-Capillary: 113 mg/dL — ABNORMAL HIGH (ref 70–99)
Glucose-Capillary: 106 mg/dL — ABNORMAL HIGH (ref 70–99)
Glucose-Capillary: 142 mg/dL — ABNORMAL HIGH (ref 70–99)
Glucose-Capillary: 238 mg/dL — ABNORMAL HIGH (ref 70–99)
Glucose-Capillary: 267 mg/dL — ABNORMAL HIGH (ref 70–99)

## 2023-05-13 LAB — CULTURE, BLOOD (ROUTINE X 2)

## 2023-05-13 SURGERY — LAPAROSCOPIC CHOLECYSTECTOMY WITH INTRAOPERATIVE CHOLANGIOGRAM
Anesthesia: General | Site: Abdomen

## 2023-05-13 MED ORDER — FENTANYL CITRATE (PF) 100 MCG/2ML IJ SOLN
25.0000 ug | INTRAMUSCULAR | Status: DC | PRN
  Administered 2023-05-13 (×2): 50 ug via INTRAVENOUS
  Administered 2023-05-13 (×2): 25 ug via INTRAVENOUS

## 2023-05-13 MED ORDER — BUPIVACAINE-EPINEPHRINE 0.25% -1:200000 IJ SOLN
INTRAMUSCULAR | Status: DC | PRN
  Administered 2023-05-13: 30 mL

## 2023-05-13 MED ORDER — LIDOCAINE 2% (20 MG/ML) 5 ML SYRINGE
INTRAMUSCULAR | Status: AC
  Filled 2023-05-13: qty 5

## 2023-05-13 MED ORDER — POTASSIUM CHLORIDE 10 MEQ/100ML IV SOLN
10.0000 meq | INTRAVENOUS | Status: AC
  Administered 2023-05-13 (×4): 10 meq via INTRAVENOUS
  Filled 2023-05-13 (×4): qty 100

## 2023-05-13 MED ORDER — FENTANYL CITRATE (PF) 250 MCG/5ML IJ SOLN
INTRAMUSCULAR | Status: AC
  Filled 2023-05-13: qty 5

## 2023-05-13 MED ORDER — SCOPOLAMINE 1 MG/3DAYS TD PT72
MEDICATED_PATCH | TRANSDERMAL | Status: DC | PRN
  Administered 2023-05-13: 1 via TRANSDERMAL

## 2023-05-13 MED ORDER — SODIUM CHLORIDE 0.9 % IR SOLN
Status: DC | PRN
  Administered 2023-05-13: 1000 mL

## 2023-05-13 MED ORDER — AMISULPRIDE (ANTIEMETIC) 5 MG/2ML IV SOLN: 10.0000 mg | Freq: Once | INTRAVENOUS | Status: DC | PRN

## 2023-05-13 MED ORDER — CHLORHEXIDINE GLUCONATE 0.12 % MT SOLN
15.0000 mL | Freq: Once | OROMUCOSAL | Status: AC
  Administered 2023-05-13: 15 mL via OROMUCOSAL

## 2023-05-13 MED ORDER — OXYCODONE HCL 5 MG/5ML PO SOLN: 5.0000 mg | Freq: Once | ORAL | Status: DC | PRN

## 2023-05-13 MED ORDER — POTASSIUM CHLORIDE 10 MEQ/100ML IV SOLN: 10.0000 meq | INTRAVENOUS | Status: AC

## 2023-05-13 MED ORDER — FENTANYL CITRATE (PF) 250 MCG/5ML IJ SOLN
INTRAMUSCULAR | Status: DC | PRN
  Administered 2023-05-13 (×2): 50 ug via INTRAVENOUS
  Administered 2023-05-13: 100 ug via INTRAVENOUS

## 2023-05-13 MED ORDER — PROMETHAZINE HCL 25 MG/ML IJ SOLN: 6.2500 mg | INTRAMUSCULAR | Status: DC | PRN

## 2023-05-13 MED ORDER — EPHEDRINE 5 MG/ML INJ
INTRAVENOUS | Status: AC
  Filled 2023-05-13: qty 5

## 2023-05-13 MED ORDER — FENTANYL CITRATE (PF) 100 MCG/2ML IJ SOLN
INTRAMUSCULAR | Status: AC
  Filled 2023-05-13: qty 2

## 2023-05-13 MED ORDER — MIDAZOLAM HCL 2 MG/2ML IJ SOLN
INTRAMUSCULAR | Status: DC | PRN
  Administered 2023-05-13 (×2): 1 mg via INTRAVENOUS

## 2023-05-13 MED ORDER — LACTATED RINGERS IV SOLN: INTRAVENOUS | Status: DC

## 2023-05-13 MED ORDER — ROCURONIUM BROMIDE 10 MG/ML (PF) SYRINGE
PREFILLED_SYRINGE | INTRAVENOUS | Status: DC | PRN
  Administered 2023-05-13: 60 mg via INTRAVENOUS

## 2023-05-13 MED ORDER — ONDANSETRON HCL 4 MG/2ML IJ SOLN
INTRAMUSCULAR | Status: AC
  Filled 2023-05-13: qty 2

## 2023-05-13 MED ORDER — PROPOFOL 10 MG/ML IV BOLUS
INTRAVENOUS | Status: AC
  Filled 2023-05-13: qty 20

## 2023-05-13 MED ORDER — ONDANSETRON HCL 4 MG/2ML IJ SOLN
INTRAMUSCULAR | Status: DC | PRN
  Administered 2023-05-13: 4 mg via INTRAVENOUS

## 2023-05-13 MED ORDER — BUPIVACAINE-EPINEPHRINE (PF) 0.25% -1:200000 IJ SOLN
INTRAMUSCULAR | Status: AC
  Filled 2023-05-13: qty 30

## 2023-05-13 MED ORDER — GLYCOPYRROLATE PF 0.2 MG/ML IJ SOSY
PREFILLED_SYRINGE | INTRAMUSCULAR | Status: AC
  Filled 2023-05-13: qty 1

## 2023-05-13 MED ORDER — ORAL CARE MOUTH RINSE: 15.0000 mL | Freq: Once | OROMUCOSAL | Status: AC

## 2023-05-13 MED ORDER — INSULIN ASPART 100 UNIT/ML IJ SOLN
0.0000 [IU] | Freq: Every day | INTRAMUSCULAR | Status: DC
  Administered 2023-05-13: 3 [IU] via SUBCUTANEOUS

## 2023-05-13 MED ORDER — ROCURONIUM BROMIDE 10 MG/ML (PF) SYRINGE
PREFILLED_SYRINGE | INTRAVENOUS | Status: AC
  Filled 2023-05-13: qty 10

## 2023-05-13 MED ORDER — CHLORHEXIDINE GLUCONATE 0.12 % MT SOLN
OROMUCOSAL | Status: AC
  Filled 2023-05-13: qty 15

## 2023-05-13 MED ORDER — APIXABAN 5 MG PO TABS
5.0000 mg | ORAL_TABLET | Freq: Two times a day (BID) | ORAL | Status: DC
  Administered 2023-05-14: 5 mg via ORAL
  Filled 2023-05-13: qty 1

## 2023-05-13 MED ORDER — LIDOCAINE 2% (20 MG/ML) 5 ML SYRINGE
INTRAMUSCULAR | Status: DC | PRN
  Administered 2023-05-13: 60 mg via INTRAVENOUS

## 2023-05-13 MED ORDER — GLYCOPYRROLATE 0.2 MG/ML IJ SOLN
INTRAMUSCULAR | Status: DC | PRN
  Administered 2023-05-13 (×2): .1 mg via INTRAVENOUS

## 2023-05-13 MED ORDER — DEXAMETHASONE SODIUM PHOSPHATE 10 MG/ML IJ SOLN
INTRAMUSCULAR | Status: AC
  Filled 2023-05-13: qty 1

## 2023-05-13 MED ORDER — SUGAMMADEX SODIUM 200 MG/2ML IV SOLN
INTRAVENOUS | Status: DC | PRN
  Administered 2023-05-13 (×2): 200 mg via INTRAVENOUS

## 2023-05-13 MED ORDER — INSULIN ASPART 100 UNIT/ML IJ SOLN: 0.0000 [IU] | INTRAMUSCULAR | Status: DC | PRN

## 2023-05-13 MED ORDER — EPHEDRINE SULFATE-NACL 50-0.9 MG/10ML-% IV SOSY
PREFILLED_SYRINGE | INTRAVENOUS | Status: DC | PRN
  Administered 2023-05-13 (×2): 5 mg via INTRAVENOUS

## 2023-05-13 MED ORDER — ACETAMINOPHEN 10 MG/ML IV SOLN
1000.0000 mg | Freq: Once | INTRAVENOUS | Status: DC | PRN
  Administered 2023-05-13: 1000 mg via INTRAVENOUS

## 2023-05-13 MED ORDER — SCOPOLAMINE 1 MG/3DAYS TD PT72
MEDICATED_PATCH | TRANSDERMAL | Status: AC
  Filled 2023-05-13: qty 1

## 2023-05-13 MED ORDER — KETOROLAC TROMETHAMINE 30 MG/ML IJ SOLN
INTRAMUSCULAR | Status: AC
  Filled 2023-05-13: qty 1

## 2023-05-13 MED ORDER — POTASSIUM CHLORIDE 10 MEQ/100ML IV SOLN
10.0000 meq | Freq: Once | INTRAVENOUS | Status: AC
  Administered 2023-05-13: 10 meq via INTRAVENOUS
  Filled 2023-05-13: qty 100

## 2023-05-13 MED ORDER — ACETAMINOPHEN 10 MG/ML IV SOLN
INTRAVENOUS | Status: AC
  Filled 2023-05-13: qty 100

## 2023-05-13 MED ORDER — INSULIN ASPART 100 UNIT/ML IJ SOLN: 0.0000 [IU] | Freq: Three times a day (TID) | INTRAMUSCULAR | Status: DC

## 2023-05-13 MED ORDER — OXYCODONE HCL 5 MG PO TABS: 5.0000 mg | ORAL_TABLET | Freq: Once | ORAL | Status: DC | PRN

## 2023-05-13 MED ORDER — KETOROLAC TROMETHAMINE 30 MG/ML IJ SOLN
30.0000 mg | Freq: Once | INTRAMUSCULAR | Status: AC | PRN
  Administered 2023-05-13: 30 mg via INTRAVENOUS

## 2023-05-13 MED ORDER — 0.9 % SODIUM CHLORIDE (POUR BTL) OPTIME
TOPICAL | Status: DC | PRN
  Administered 2023-05-13: 1000 mL

## 2023-05-13 MED ORDER — MIDAZOLAM HCL 2 MG/2ML IJ SOLN
INTRAMUSCULAR | Status: AC
  Filled 2023-05-13: qty 2

## 2023-05-13 MED ORDER — DEXAMETHASONE SODIUM PHOSPHATE 10 MG/ML IJ SOLN
INTRAMUSCULAR | Status: DC | PRN
  Administered 2023-05-13: 5 mg via INTRAVENOUS

## 2023-05-13 MED ORDER — PROPOFOL 10 MG/ML IV BOLUS
INTRAVENOUS | Status: DC | PRN
  Administered 2023-05-13: 150 mg via INTRAVENOUS

## 2023-05-13 SURGICAL SUPPLY — 43 items
APPLIER CLIP ROT 10 11.4 M/L (STAPLE)
BAG COUNTER SPONGE SURGICOUNT (BAG) ×1 IMPLANT
BLADE CLIPPER SURG (BLADE) IMPLANT
CANISTER SUCT 3000ML PPV (MISCELLANEOUS) ×1 IMPLANT
CATH CHOLANG 76X19 KUMAR (CATHETERS) IMPLANT
CHLORAPREP W/TINT 26 (MISCELLANEOUS) ×1 IMPLANT
CLIP APPLIE ROT 10 11.4 M/L (STAPLE) IMPLANT
CLIP LIGATING HEMO O LOK GREEN (MISCELLANEOUS) IMPLANT
COVER MAYO STAND STRL (DRAPES) ×1 IMPLANT
COVER SURGICAL LIGHT HANDLE (MISCELLANEOUS) ×1 IMPLANT
DERMABOND ADVANCED .7 DNX12 (GAUZE/BANDAGES/DRESSINGS) ×1 IMPLANT
DRAPE C-ARM 42X120 X-RAY (DRAPES) ×1 IMPLANT
ELECT REM PT RETURN 9FT ADLT (ELECTROSURGICAL) ×1
ELECTRODE REM PT RTRN 9FT ADLT (ELECTROSURGICAL) ×1 IMPLANT
GLOVE BIOGEL PI IND STRL 7.0 (GLOVE) ×1 IMPLANT
GLOVE SURG SS PI 7.0 STRL IVOR (GLOVE) ×1 IMPLANT
GOWN STRL REUS W/ TWL LRG LVL3 (GOWN DISPOSABLE) ×3 IMPLANT
GOWN STRL REUS W/TWL LRG LVL3 (GOWN DISPOSABLE) ×3
GRASPER SUT TROCAR 14GX15 (MISCELLANEOUS) ×1 IMPLANT
IRRIG SUCT STRYKERFLOW 2 WTIP (MISCELLANEOUS) ×1
IRRIGATION SUCT STRKRFLW 2 WTP (MISCELLANEOUS) ×1 IMPLANT
KIT BASIN OR (CUSTOM PROCEDURE TRAY) ×1 IMPLANT
KIT IMAGING PINPOINTPAQ (MISCELLANEOUS) IMPLANT
KIT TURNOVER KIT B (KITS) ×1 IMPLANT
NDL 22X1.5 STRL (OR ONLY) (MISCELLANEOUS) ×1 IMPLANT
NEEDLE 22X1.5 STRL (OR ONLY) (MISCELLANEOUS) ×1 IMPLANT
NS IRRIG 1000ML POUR BTL (IV SOLUTION) ×1 IMPLANT
PAD ARMBOARD 7.5X6 YLW CONV (MISCELLANEOUS) ×1 IMPLANT
POUCH RETRIEVAL ECOSAC 10 (ENDOMECHANICALS) ×1 IMPLANT
SCISSORS LAP 5X35 DISP (ENDOMECHANICALS) ×1 IMPLANT
SET CHOLANGIOGRAPH 5 50 .035 (SET/KITS/TRAYS/PACK) ×1 IMPLANT
SET TUBE SMOKE EVAC HIGH FLOW (TUBING) ×1 IMPLANT
SLEEVE Z-THREAD 5X100MM (TROCAR) ×2 IMPLANT
SPECIMEN JAR SMALL (MISCELLANEOUS) ×1 IMPLANT
STOPCOCK 4 WAY LG BORE MALE ST (IV SETS) IMPLANT
SUT MNCRL AB 4-0 PS2 18 (SUTURE) ×1 IMPLANT
TOWEL GREEN STERILE (TOWEL DISPOSABLE) ×1 IMPLANT
TOWEL GREEN STERILE FF (TOWEL DISPOSABLE) ×1 IMPLANT
TRAY LAPAROSCOPIC MC (CUSTOM PROCEDURE TRAY) ×1 IMPLANT
TROCAR Z THREAD OPTICAL 12X100 (TROCAR) ×1 IMPLANT
TROCAR Z-THREAD OPTICAL 5X100M (TROCAR) ×1 IMPLANT
WARMER LAPAROSCOPE (MISCELLANEOUS) ×1 IMPLANT
WATER STERILE IRR 1000ML POUR (IV SOLUTION) ×1 IMPLANT

## 2023-05-13 NOTE — Progress Notes (Signed)
Report given to OR receiving RN , Patient transported to OR in NAD, tele monitor on,. IV abx rocephin infusing.

## 2023-05-13 NOTE — Transfer of Care (Signed)
Immediate Anesthesia Transfer of Care Note  Patient: Debra Diaz  Procedure(s) Performed: LAPAROSCOPIC CHOLECYSTECTOMY (Abdomen)  Patient Location: PACU  Anesthesia Type:General  Level of Consciousness: awake and patient cooperative  Airway & Oxygen Therapy: Patient Spontanous Breathing and Patient connected to face mask oxygen  Post-op Assessment: Report given to RN and Post -op Vital signs reviewed and stable  Post vital signs: Reviewed and stable  Last Vitals:  Vitals Value Taken Time  BP 126/46 05/13/23 1108  Temp    Pulse 71 05/13/23 1110  Resp 32 05/13/23 1110  SpO2 95 % 05/13/23 1110  Vitals shown include unvalidated device data.  Last Pain:  Vitals:   05/13/23 0802  TempSrc: Oral  PainSc:          Complications: No notable events documented.

## 2023-05-13 NOTE — Plan of Care (Signed)

## 2023-05-13 NOTE — Progress Notes (Signed)
OT Cancellation Note  Patient Details Name: Debra Diaz MRN: [id number] DOB: [birth year]   Cancelled Treatment:    Reason Eval/Treat Not Completed: Patient at procedure or test/ unavailable (In surgery. Will follow up at schedule allows.)  Tyler Deis, OTR/L [facility] Office: [phone]   Debra Diaz 05/13/2023, 8:06 AM

## 2023-05-13 NOTE — Anesthesia Procedure Notes (Signed)
Procedure Name: Intubation Date/Time: 05/13/2023 9:56 AM  Performed by: Audie Pinto, CRNAPre-anesthesia Checklist: Patient identified, Emergency Drugs available, Suction available and Patient being monitored Patient Re-evaluated:Patient Re-evaluated prior to induction Oxygen Delivery Method: Circle system utilized Preoxygenation: Pre-oxygenation with 100% oxygen Induction Type: IV induction Ventilation: Oral airway inserted - appropriate to patient size and Two handed mask ventilation required Laryngoscope Size: Glidescope and 3 Grade View: Grade I Tube type: Oral Tube size: 7.0 mm Number of attempts: 1 Airway Equipment and Method: Stylet and Oral airway Placement Confirmation: ETT inserted through vocal cords under direct vision, positive ETCO2 and breath sounds checked- equal and bilateral Secured at: 20 cm Tube secured with: Tape Dental Injury: Teeth and Oropharynx as per pre-operative assessment  Comments: Elective glidescope, previous glidescope intubation documented. Small mouth opening.

## 2023-05-13 NOTE — Progress Notes (Signed)
The patient was evaluated while getting ready to be transported for surgery this morning.  She denies any pain.  Doing well from a pancreatitis standpoint. Resident to reassess post-OP Continue current management plan.

## 2023-05-13 NOTE — Progress Notes (Signed)
   05/13/23 0035  Provider Notification  Provider Name/Title Sabino Dick  Date Provider Notified 05/13/23  Time Provider Notified [phone]  Method of Notification  (secure chat)  Notification Reason Other (Comment) (sinus brady 34 to 38, asymptomatic. Concerned as pt scheduled for AM procedure.)  Provider response No new orders  Date of Provider Response 05/13/23  Time of Provider Response 0040

## 2023-05-13 NOTE — Progress Notes (Signed)
Handoff report received, patient transported off unit to OR with tele box accompanied by night shift nurse. Patient brushed her teeth, removed jewelry, underwear, and CHG completed prior to leaving the unit.
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
## 2023-05-13 NOTE — Op Note (Signed)
PATIENT:  Debra Diaz  47 y.o. female  PRE-OPERATIVE DIAGNOSIS:  gallstone pancreatitis  POST-OPERATIVE DIAGNOSIS:  gallstone pancreatitis  PROCEDURE:  Procedure(s): LAPAROSCOPIC CHOLECYSTECTOMY   SURGEON:  Danna Casella, De Blanch, MD   ASSISTANT: none  ANESTHESIA:   local and general  Indications for procedure: [name] is a 47 y.o. female with symptoms of Abdominal pain and Nausea and vomiting consistent with gallbladder disease, Confirmed by ultrasound.  Description of procedure: The patient was brought into the operative suite, placed supine. Anesthesia was administered with endotracheal tube. Patient was strapped in place and foot board was secured. All pressure points were offloaded by foam padding. The patient was prepped and draped in the usual sterile fashion.  A periumbilical incision was made and optical entry was used to enter the abdomen. 2 5 mm trocars were placed on in the right lateral space on in the right subcostal space. A 12mm trocar was placed in the subxiphoid space. Marcaine was infused to the subxiphoid space and lateral upper right abdomen in the transversus abdominis plane. Next the patient was placed in reverse trendelenberg. The gallbladder appeareddilated and chronically inflamed. Omentum was adhered to the gallbladder and was taken down with cautery/blunt dissection.  The gallbladder was retracted cephalad and lateral. The peritoneum was reflected off the infundibulum working lateral to medial. The cystic duct and cystic artery were identified and further dissection revealed a critical view. The cystic duct and cystic artery were doubly clipped and ligated.   The gallbladder was removed off the liver bed with cautery. The Gallbladder was placed in a specimen bag. The gallbladder fossa was irrigated and hemostasis was applied with cautery. The gallbladder was removed via the 12mm trocar. The fascial defect was closed with interrupted  0 vicryl suture via laparoscopic trans-fascial suture passer. Pneumoperitoneum was removed, all trocar were removed. All incisions were closed with 4-0 monocryl subcuticular stitch. The patient woke from anesthesia and was brought to PACU in stable condition. All counts were correct  Findings: chronic cholecystiits  Specimen: gallbladder  Blood loss: 30 ml  Local anesthesia: 30 ml Marcaine  Complications: none  PLAN OF CARE: Admit to inpatient   PATIENT DISPOSITION:  PACU - hemodynamically stable.   De Blanch [facility] Surgery, [place]

## 2023-05-13 NOTE — Anesthesia Preprocedure Evaluation (Addendum)
Anesthesia Evaluation  Patient identified by MRN, date of birth, ID band Patient awake    Reviewed: Allergy & Precautions, NPO status , Patient's Chart, lab work & pertinent test results  Airway Mallampati: II  TM Distance: >3 FB Neck ROM: Full    Dental no notable dental hx.    Pulmonary former smoker   Pulmonary exam normal        Cardiovascular hypertension, Pt. on medications Normal cardiovascular exam+ dysrhythmias   ECHO: 1. Left ventricular ejection fraction, by estimation, is 60 to 65%. The  left ventricle has normal function. The left ventricle has no regional  wall motion abnormalities. Left ventricular diastolic parameters were  normal.   2. Right ventricular systolic function is normal. The right ventricular  size is normal. There is normal pulmonary artery systolic pressure.   3. The mitral valve is normal in structure. Mild mitral valve  regurgitation. No evidence of mitral stenosis.   4. Tricuspid valve regurgitation is mild to moderate.   5. The aortic valve is normal in structure. Aortic valve regurgitation is  not visualized. No aortic stenosis is present.   6. The inferior vena cava is normal in size with greater than 50%  respiratory variability, suggesting right atrial pressure of 3 mmHg.     Neuro/Psych  Headaches  Anxiety        GI/Hepatic negative GI ROS, Neg liver ROS,,,  Endo/Other  diabetes  Patient on GLP-1 Agonist  Renal/GU negative Renal ROS     Musculoskeletal negative musculoskeletal ROS (+)    Abdominal  (+) + obese  Peds  Hematology  (+) Blood dyscrasia (Eliquis), anemia   Anesthesia Other Findings gallstone pancreatitis  Reproductive/Obstetrics                              Anesthesia Physical Anesthesia Plan  ASA: 3  Anesthesia Plan: General   Post-op Pain Management:    Induction: Intravenous  PONV Risk Score and Plan: 4 or greater and  Ondansetron, Dexamethasone, Midazolam, Scopolamine patch - Pre-op and Treatment may vary due to age or medical condition  Airway Management Planned: Oral ETT  Additional Equipment:   Intra-op Plan:   Post-operative Plan: Extubation in OR  Informed Consent: I have reviewed the patients History and Physical, chart, labs and discussed the procedure including the risks, benefits and alternatives for the proposed anesthesia with the patient or authorized representative who has indicated his/her understanding and acceptance.     Dental advisory given  Plan Discussed with: CRNA  Anesthesia Plan Comments:          Anesthesia Quick Evaluation

## 2023-05-13 NOTE — Progress Notes (Signed)
Patient returned to unit s/p lap choley. Patient awake and alert, 2 L Oasis in use from recovery. Four abdominal trocar sites approximated with dermabond. Potassium orders infusing at this time. Patient is being monitored for asymptomatic bradycardia. SCDs applied. Pain medication administered noted effective. Patient is tolerating diet and is due to void.

## 2023-05-13 NOTE — Progress Notes (Signed)
New orders received for CBG, HS sliding scale.

## 2023-05-13 NOTE — Anesthesia Postprocedure Evaluation (Signed)
Anesthesia Post Note  Patient: Debra Diaz  Procedure(s) Performed: LAPAROSCOPIC CHOLECYSTECTOMY (Abdomen)     Patient location during evaluation: PACU Anesthesia Type: General Level of consciousness: awake Pain management: pain level controlled Vital Signs Assessment: post-procedure vital signs reviewed and stable Respiratory status: spontaneous breathing, nonlabored ventilation and respiratory function stable Cardiovascular status: blood pressure returned to baseline and stable Postop Assessment: no apparent nausea or vomiting Anesthetic complications: no   No notable events documented.  Last Vitals:  Vitals:   05/13/23 1254 05/13/23 1540  BP: 111/63 120/67  Pulse: (!) 48 (!) 45  Resp: 20 18  Temp: 36.8 C 36.8 C  SpO2: 100% 95%    Last Pain:  Vitals:   05/13/23 1333  TempSrc:   PainSc: 0-No pain                 Kirstine Jacquin P Jaycie Kregel

## 2023-05-13 NOTE — Progress Notes (Signed)
Pre Procedure note for inpatients:   Debra Diaz has been scheduled for Procedure(s): LAPAROSCOPIC CHOLECYSTECTOMY WITH POSSIBLE INTRAOPERATIVE CHOLANGIOGRAM (N/A) today. The various methods of treatment have been discussed with the patient. After consideration of the risks, benefits and treatment options the patient has consented to the planned procedure.   The patient has been seen and labs reviewed. There are no changes in the patient's condition to prevent proceeding with the planned procedure today.  Recent labs:  Lab Results  Component Value Date   WBC 10.0 05/13/2023   HGB 11.0 (L) 05/13/2023   HCT 34.0 (L) 05/13/2023   PLT 219 05/13/2023   GLUCOSE 117 (H) 05/13/2023   CHOL 161 06/03/2021   TRIG 55 05/09/2023   HDL 32 (L) 06/03/2021   LDLDIRECT 97 09/27/2021   LDLCALC 98 06/03/2021   ALT 43 05/13/2023   AST 19 05/13/2023   NA 136 05/13/2023   K 3.4 (L) 05/13/2023   CL 106 05/13/2023   CREATININE 0.54 05/13/2023   BUN 6 05/13/2023   CO2 24 05/13/2023   TSH 1.450 07/26/2021   INR 1.1 05/10/2023   HGBA1C 6.5 (H) 05/10/2023    Rodman Pickle, MD 05/13/2023 9:07 AM

## 2023-05-14 ENCOUNTER — Encounter (HOSPITAL_COMMUNITY): Payer: Self-pay | Admitting: General Surgery

## 2023-05-14 LAB — COMPREHENSIVE METABOLIC PANEL
ALT: 50 U/L — ABNORMAL HIGH (ref 0–44)
AST: 56 U/L — ABNORMAL HIGH (ref 15–41)
Albumin: 2.3 g/dL — ABNORMAL LOW (ref 3.5–5.0)
Alkaline Phosphatase: 96 U/L (ref 38–126)
Anion gap: 7 (ref 5–15)
BUN: 8 mg/dL (ref 6–20)
CO2: 23 mmol/L (ref 22–32)
Calcium: 7.9 mg/dL — ABNORMAL LOW (ref 8.9–10.3)
Chloride: 106 mmol/L (ref 98–111)
Creatinine, Ser: 0.58 mg/dL (ref 0.44–1.00)
GFR, Estimated: >60 mL/min (ref >60)
Glucose, Bld: 151 mg/dL — ABNORMAL HIGH (ref 70–99)
Potassium: 4.5 mmol/L (ref 3.5–5.1)
Sodium: 136 mmol/L (ref 135–145)
Total Bilirubin: 0.5 mg/dL (ref 0.3–1.2)
Total Protein: 5.5 g/dL — ABNORMAL LOW (ref 6.5–8.1)

## 2023-05-14 LAB — CBC WITH DIFFERENTIAL/PLATELET
Abs Immature Granulocytes: 0.06 10*3/uL (ref 0.00–0.07)
Basophils Absolute: 0 10*3/uL (ref 0.0–0.1)
Basophils Relative: 0 %
Eosinophils Absolute: 0 10*3/uL (ref 0.0–0.5)
Eosinophils Relative: 0 %
HCT: 34.1 % — ABNORMAL LOW (ref 36.0–46.0)
Hemoglobin: 10.8 g/dL — ABNORMAL LOW (ref 12.0–15.0)
Immature Granulocytes: 1 %
Lymphocytes Relative: 12 %
Lymphs Abs: 1.4 10*3/uL (ref 0.7–4.0)
MCH: 28.6 pg (ref 26.0–34.0)
MCHC: 31.7 g/dL (ref 30.0–36.0)
MCV: 90.5 fL (ref 80.0–100.0)
Monocytes Absolute: 1 10*3/uL (ref 0.1–1.0)
Monocytes Relative: 9 %
Neutro Abs: 9.3 10*3/uL — ABNORMAL HIGH (ref 1.7–7.7)
Neutrophils Relative %: 78 %
Platelets: 229 10*3/uL (ref 150–400)
RBC: 3.77 MIL/uL — ABNORMAL LOW (ref 3.87–5.11)
RDW: 13.8 % (ref 11.5–15.5)
WBC: 11.8 10*3/uL — ABNORMAL HIGH (ref 4.0–10.5)
nRBC: 0 % (ref 0.0–0.2)

## 2023-05-14 LAB — CULTURE, BLOOD (ROUTINE X 2)

## 2023-05-14 LAB — GLUCOSE, CAPILLARY: Glucose-Capillary: 122 mg/dL — ABNORMAL HIGH (ref 70–99)

## 2023-05-14 MED ORDER — ACETAMINOPHEN 325 MG PO TABS: 650.0000 mg | ORAL_TABLET | Freq: Four times a day (QID) | ORAL | Status: AC | PRN

## 2023-05-14 MED ORDER — TRAMADOL HCL 50 MG PO TABS: 50.0000 mg | ORAL_TABLET | Freq: Four times a day (QID) | ORAL | Status: DC | PRN

## 2023-05-14 MED ORDER — TRAMADOL HCL 50 MG PO TABS: 50.0000 mg | ORAL_TABLET | Freq: Four times a day (QID) | ORAL | 0 refills | Status: DC | PRN

## 2023-05-14 MED ORDER — ACETAMINOPHEN 325 MG PO TABS: 650.0000 mg | ORAL_TABLET | Freq: Four times a day (QID) | ORAL | Status: DC | PRN

## 2023-05-14 NOTE — Progress Notes (Signed)
Progress Note  1 Day Post-Op  Subjective: Doing well, pain controlled, tolerating a diet, but not a great appetite.  Otherwise voiding and doing well.  Objective: Vital signs in last 24 hours: Temp:  [98.2 F (36.8 C)-98.8 F (37.1 C)] 98.8 F (37.1 C) (05/27 0737) Pulse Rate:  [41-72] 41 (05/27 0737) Resp:  [16-32] 18 (05/27 0737) BP: (99-126)/(42-70) 122/70 (05/27 0737) SpO2:  [92 %-100 %] 98 % (05/27 0737) Last BM Date : 05/07/23  Intake/Output from previous day: 05/26 0701 - 05/27 0700 In: 3901 [P.O.:240; I.V.:3491.5; IV Piggyback:169.5] Out: 50 [Blood:50] Intake/Output this shift: No intake/output data recorded.  PE: Abd: soft, appropriately tender, incisions c/d/i   Lab Results:  Recent Labs    05/13/23 0051 05/14/23 0133  WBC 10.0 11.8*  HGB 11.0* 10.8*  HCT 34.0* 34.1*  PLT 219 229   BMET Recent Labs    05/13/23 0051 05/14/23 0133  NA 136 136  K 3.4* 4.5  CL 106 106  CO2 24 23  GLUCOSE 117* 151*  BUN 6 8  CREATININE 0.54 0.58  CALCIUM 7.7* 7.9*   PT/INR No results for input(s): "LABPROT", "INR" in the last 72 hours.  CMP     Component Value Date/Time   NA 136 05/14/2023 0133   NA 140 04/27/2022 1420   K 4.5 05/14/2023 0133   CL 106 05/14/2023 0133   CO2 23 05/14/2023 0133   GLUCOSE 151 (H) 05/14/2023 0133   BUN 8 05/14/2023 0133   BUN 11 04/27/2022 1420   CREATININE 0.58 05/14/2023 0133   CREATININE 0.64 09/01/2016 1607   CALCIUM 7.9 (L) 05/14/2023 0133   PROT 5.5 (L) 05/14/2023 0133   ALBUMIN 2.3 (L) 05/14/2023 0133   AST 56 (H) 05/14/2023 0133   ALT 50 (H) 05/14/2023 0133   ALKPHOS 96 05/14/2023 0133   BILITOT 0.5 05/14/2023 0133   GFRNONAA >60 05/14/2023 0133   GFRNONAA >89 09/01/2016 1607   GFRAA 125 11/18/2020 1126   GFRAA >89 09/01/2016 1607   Lipase     Component Value Date/Time   LIPASE 103 (H) 05/12/2023 0133       Studies/Results: No results found.  Anti-infectives: Anti-infectives (From admission,  onward)    Start     Dose/Rate Route Frequency Ordered Stop   05/13/23 0600  cefTRIAXone (ROCEPHIN) 2 g in sodium chloride 0.9 % 100 mL IVPB        2 g 200 mL/hr over 30 Minutes Intravenous On call to O.R. 05/12/23 1010 05/13/23 0802   05/12/23 1145  azithromycin (ZITHROMAX) 500 mg in sodium chloride 0.9 % 250 mL IVPB  Status:  Discontinued        500 mg 250 mL/hr over 60 Minutes Intravenous Every 24 hours 05/12/23 1045 05/14/23 0905   05/12/23 1145  metroNIDAZOLE (FLAGYL) IVPB 500 mg  Status:  Discontinued        500 mg 100 mL/hr over 60 Minutes Intravenous Every 12 hours 05/12/23 1045 05/14/23 0905   05/12/23 1130  cefTRIAXone (ROCEPHIN) 2 g in sodium chloride 0.9 % 100 mL IVPB        2 g 200 mL/hr over 30 Minutes Intravenous Every 24 hours 05/12/23 1045 05/12/23 1236   05/12/23 0600  cefTRIAXone (ROCEPHIN) 2 g in sodium chloride 0.9 % 100 mL IVPB  Status:  Discontinued        2 g 200 mL/hr over 30 Minutes Intravenous On call to O.R. 05/11/23 1534 05/12/23 1010   05/10/23 1000  vancomycin (  VANCOCIN) IVPB 750 mg/150 ml premix  Status:  Discontinued        750 mg 150 mL/hr over 60 Minutes Intravenous Every 12 hours 05/09/23 2253 05/10/23 0634   05/10/23 0600  piperacillin-tazobactam (ZOSYN) IVPB 3.375 g  Status:  Discontinued        3.375 g 12.5 mL/hr over 240 Minutes Intravenous Every 8 hours 05/09/23 2248 05/10/23 0634   05/09/23 2300  piperacillin-tazobactam (ZOSYN) IVPB 3.375 g        3.375 g 100 mL/hr over 30 Minutes Intravenous  Once 05/09/23 2248 05/09/23 2325   05/09/23 2300  vancomycin (VANCOCIN) 1,750 mg in sodium chloride 0.9 % 500 mL IVPB        1,750 mg 258.8 mL/hr over 120 Minutes Intravenous  Once 05/09/23 2248 05/10/23 0149        Assessment/Plan POD 1, s/p lap chole, Dr. Sheliah Hatch 05/13/23 for Biliary pancreatitis  - doing well post op -tolerating a solid diet -pain controlled -surgically stable for DC home -follow up will be arranged, work noted provided, and  medication sent to pharmacy -d/w primary service.   FEN: regular diet VTE: may resume eliquis ID: no abx needed  LOS: 4 days    Letha Cape, [facility] Surgery 05/14/2023, 9:06 AM Please see Amion for pager number during day hours 7:00am-4:30pm

## 2023-05-14 NOTE — Progress Notes (Signed)
Pt discharged to home in stable condition, all discharge instructions and prescriptions reviewed with pt, work note given. Pt gives complete verbal understanding of instructions and aware to pick up prescription from designated pharmacy. Pt awaiting ride at this time.

## 2023-05-14 NOTE — Progress Notes (Signed)
Physical Therapy Treatment Patient Details Name: Debra Diaz MRN: [id number] DOB: 1976-09-16 Today's Date: 05/14/2023   History of Present Illness Pt is a 47 y/o F admitted on 05/10/23 after presenting with c/o abdominal & chest pain & found to have acute pancreatitis. underwent LAPAROSCOPIC CHOLECYSTECTOMY on 5/26. PMH: sinus bradycardia, a-flutter s/p ablation 3/23, atrial myxoma s/p resection 2016, DM2, HLD, HTN    PT Comments    Pt with 9/10 R abdominal pain from surgery yesterday. Pt using folded blanket as a splint to aide in pain management. Pt tolerated ambulation without AD this date and was able to negotiate stairs with HHA to safely enter home. Sons return demonstrated safe HHA on L side. Acute PT to cont to follow.    Recommendations for follow up therapy are one component of a multi-disciplinary discharge planning process, led by the attending physician.  Recommendations may be updated based on patient status, additional functional criteria and insurance authorization.  Follow Up Recommendations       Assistance Recommended at Discharge PRN  Patient can return home with the following Assistance with cooking/housework;Help with stairs or ramp for entrance   Equipment Recommendations  None recommended by PT    Recommendations for Other Services       Precautions / Restrictions Precautions Precautions: None Restrictions Weight Bearing Restrictions: No     Mobility  Bed Mobility               General bed mobility comments: sitting EOB upon PT arrival, discussed and demo'd log rolling to the L however pt reports having a recliner she plans on sleeping in    Transfers Overall transfer level: Needs assistance Equipment used: None Transfers: Sit to/from Stand Sit to Stand: Min guard           General transfer comment: slow, guarded, cautious, used folded blanket to splint R flankt to aide in pain management, no physical assist needed     Ambulation/Gait Ambulation/Gait assistance: Min guard Gait Distance (Feet): 60 Feet (x2) Assistive device: None Gait Pattern/deviations: Decreased step length - right, Decreased step length - left, Decreased stride length Gait velocity: significantly decreased     General Gait Details: pt with mild trunk flexion, reaching with UE for hallway rail, a couple of standing rest breaks prior to needing seated rest break, no LOB   Stairs Stairs: Yes Stairs assistance: Min assist Stair Management: Step to pattern (L HHA) Number of Stairs: 2 General stair comments: pt instructed on step to pattern, pt's son instructed on how to provide optimal assist to patient on stairs as they don't have a handrail on their stairs   Wheelchair Mobility    Modified Rankin (Stroke Patients Only)       Balance Overall balance assessment: Needs assistance Sitting-balance support: Feet supported Sitting balance-Leahy Scale: Normal     Standing balance support: During functional activity, Single extremity supported Standing balance-Leahy Scale: Fair                              Cognition Arousal/Alertness: Awake/alert Behavior During Therapy: WFL for tasks assessed/performed Overall Cognitive Status: Within Functional Limits for tasks assessed                                          Exercises      General Comments General comments (  skin integrity, edema, etc.): VSS      Pertinent Vitals/Pain Pain Assessment Pain Assessment: 0-10 Pain Score: 9  Pain Location: abdomen Pain Descriptors / Indicators: Guarding, Grimacing, Discomfort    Home Living                          Prior Function            PT Goals (current goals can now be found in the care plan section) Acute Rehab PT Goals Patient Stated Goal: get better PT Goal Formulation: With patient Time For Goal Achievement: 05/25/23 Potential to Achieve Goals: Good Progress towards PT  goals: Progressing toward goals    Frequency    Min 3X/week      PT Plan Current plan remains appropriate    Co-evaluation              AM-PAC PT "6 Clicks" Mobility   Outcome Measure  Help needed turning from your back to your side while in a flat bed without using bedrails?: A Lot Help needed moving from lying on your back to sitting on the side of a flat bed without using bedrails?: A Lot Help needed moving to and from a bed to a chair (including a wheelchair)?: A Little Help needed standing up from a chair using your arms (e.g., wheelchair or bedside chair)?: A Little Help needed to walk in hospital room?: A Little Help needed climbing 3-5 steps with a railing? : A Little 6 Click Score: 16    End of Session   Activity Tolerance: Patient tolerated treatment well Patient left: in bed;with call bell/phone within reach;with family/visitor present (sitting EOB) Nurse Communication: Mobility status PT Visit Diagnosis: Unsteadiness on feet (R26.81);Muscle weakness (generalized) (M62.81)     Time: 8119-1478 PT Time Calculation (min) (ACUTE ONLY): 14 min  Charges:  $Gait Training: 8-22 mins                     Lewis Shock, PT, DPT Acute Rehabilitation Services Secure chat preferred Office #: [phone]    Iona Hansen 05/14/2023, 11:38 AM

## 2023-05-14 NOTE — Discharge Summary (Signed)
Family Medicine Teaching [facility] Discharge Summary  Patient name: Debra Diaz Medical record number: [id number] Date of birth: 10-15-1976 Age: 47 y.o. Gender: female Date of Admission: 05/09/2023  Date of Discharge: 05/14/23 Admitting Physician: Vonna Drafts, MD  Primary Care Provider: Bess Kinds, MD Consultants: GI, surgery  Indication for Hospitalization: pancreatitis  Discharge Diagnoses/Problem List:  Principal Problem for Admission: acute gallstone pancreatitis Other Problems addressed during stay:  Principal Problem:   Acute gallstone pancreatitis Active Problems:   Chest pain   Bradycardia   Hypertension   Atrial flutter (HCC)   High risk social situation   Pancreatitis   Calculus of gallbladder without cholecystitis without obstruction    Brief Hospital Course:   Acute pancreatitis P/w severe, sudden onset epigastric and RUQ pain radiating to back. Lipase elevated 3986 and downtrended to 103. Leukocytosis 24.3 and downtrended to 10. Elevated lactate. Cholelithiasis noted on RUQ u/s, but no evidence of acute cholecystitis or biliary dilitation.  Etiology most likely from gallstone pancreatitis. Considered Ozempic use however, reports not taking for two weeks.  GI saw patient and started fluids. MRCP did not show pancreatic necrosis or pancreatic fluid collection.  There was pancreatic duct dilatation.  We continued pain control and advance diet as tolerated.  Surgery consulted and laparoscopic cholecystectomy performed on 05/13/2023. Patient discharged in stable condition with pain meds and surgery follow up to be scheduled.  Bradycardia  Sinus bradycardia with HR 38 on EKG. HR improved to 50s-60s on monitor upon admission. Patient with known history of sinus bradycardia, has followed with cardiology for this. Underwent ambulatory monitoring recently, no treatment. She also has a history of atrial flutter s/p ablation in 02/2022. On Eliquis at  home. Also has a history of atrial myxoma s/p resection in 2016. Echo in 01/2022 with EF 50-55%. Cardiac exam unremarkable aside from soft systolic murmur.  Echo during this hospital stay showed EF 60-65%.  Heart rate during the hospital stay ranged from 40s-70s.  PCP f/u Ensure pain control, f/u with surgery Ensure f/u with cardiology for bradycardia Ozempic dc'd upon discharge due to pancreatitis   Disposition: home  Discharge Condition: stable, improved   Discharge Exam:  Vitals:   05/14/23 0436 05/14/23 0737  BP: 122/65 122/70  Pulse: (!) 41 (!) 41  Resp:  18  Temp: 98.6 F (37 C) 98.8 F (37.1 C)  SpO2: 96% 98%   General: NAD, pleasant, able to participate in exam Cardiac: bradycardic, regular rhythm Respiratory: CTAB, normal WOB Abdomen: soft, mildly tender to palpation in RUQ, incision c/d/i Extremities: warm and well perfused, no edema or cyanosis Skin: warm and dry, no rashes noted Neuro: alert, no obvious focal deficits, speech normal Psych: Normal affect and mood  Significant Procedures: MRCP, laparoscopic cholecystectomy  Significant Labs and Imaging:  Recent Labs  Lab 05/13/23 0051 05/14/23 0133  WBC 10.0 11.8*  HGB 11.0* 10.8*  HCT 34.0* 34.1*  PLT 219 229   Recent Labs  Lab 05/13/23 0051 05/14/23 0133  NA 136 136  K 3.4* 4.5  CL 106 106  CO2 24 23  GLUCOSE 117* 151*  BUN 6 8  CREATININE 0.54 0.58  CALCIUM 7.7* 7.9*  ALKPHOS 95 96  AST 19 56*  ALT 43 50*  ALBUMIN 2.4* 2.3*   MRCP IMPRESSION: 1. Severe inflammatory fat stranding and fluid about the pancreas and adjacent retroperitoneum, particularly involving the body and tail, consistent with acute pancreatitis. No evidence of complicating pancreatic necrosis or acute pancreatic fluid collection. Pancreatic ductal  dilatation. 2. Cholelithiasis. Trace pericholecystic fluid without significant gallbladder wall thickening. 3. No biliary ductal dilatation. 4. 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 5. Cardiomegaly. Small bilateral pleural effusions.   RUQ U/S   IMPRESSION: Cholelithiasis without sonographic evidence for acute cholecystitis. No biliary dilatation. Slightly echogenic liver parenchyma may be seen with hepatic steatosis.   CTA Chest / CT abd/pelvis IMPRESSION: Chest:   1. No evidence of pulmonary embolus. 2. Dilated main pulmonary artery consistent with pulmonary arterial hypertension. 3. Coronary artery atherosclerosis.   Abdomen/pelvis:   1. Acute uncomplicated pancreatitis. No fluid collection, pseudocyst, or abscess. 2. Indeterminate 2.5 cm hypodensity right lobe liver, favor benign etiology such as hemangioma. If definitive characterization is desired, nonemergent outpatient liver MRI could be considered. 3. Trace reactive ascites.   CXR  IMPRESSION: 1. No active cardiopulmonary disease is radiographically apparent. 2. Low lung volumes.  Results/Tests Pending at Time of Discharge: n/a  Discharge Medications:  Allergies as of 05/14/2023       Reactions   Cherry Itching, Rash        Medication List     STOP taking these medications    Semaglutide(0.25 or 0.5MG /DOS) 2 MG/1.5ML Sopn       TAKE these medications    Accu-Chek Guide test strip Generic drug: glucose blood USE TO CHECK BLOOD SUGAR UP TO FOUR TIMES DAILY   Accu-Chek Softclix Lancets lancets USE TO CHECK BLOOD SUGAR UP TO FOUR TIMES DAILY   acetaminophen 325 MG tablet Commonly known as: TYLENOL Take 2 tablets (650 mg total) by mouth every 6 (six) hours as needed for fever.   blood glucose meter kit and supplies Kit Dispense based on patient and insurance preference. Use up to four times daily as directed. (FOR ICD-9 250.00, 250.01).   Blood Pressure Kit Check blood pressure twice a day.  Dx code: I10   Eliquis 5 MG Tabs tablet Generic drug: apixaban TAKE 1  TABLET(5 MG) BY MOUTH TWICE DAILY What changed: See the new instructions.   linaclotide 290 MCG Caps capsule Commonly known as: LINZESS Take 1 capsule (290 mcg total) by mouth daily before breakfast. What changed:  when to take this reasons to take this   losartan-hydrochlorothiazide 100-25 MG tablet Commonly known as: HYZAAR TAKE 1 TABLET BY MOUTH DAILY   metFORMIN 500 MG 24 hr tablet Commonly known as: GLUCOPHAGE-XR TAKE 2 TABLETS(1000 MG) BY MOUTH IN THE MORNING AND AT BEDTIME   traMADol 50 MG tablet Commonly known as: ULTRAM Take 1 tablet (50 mg total) by mouth every 6 (six) hours as needed for moderate pain.        Discharge Instructions: Please refer to Patient Instructions section of EMR for full details.  Patient was counseled important signs and symptoms that should prompt return to medical care, changes in medications, dietary instructions, activity restrictions, and follow up appointments.   Follow-Up Appointments:  Dr. Barbaraann Faster @ [facility] 5/31 2:30PM  [place] PA-C @ Cardiology 6/12 9:00AM  Vonna Drafts, MD 05/14/2023, 10:23 AM PGY-1, [facility]

## 2023-05-15 ENCOUNTER — Telehealth: Payer: Self-pay

## 2023-05-15 LAB — CULTURE, BLOOD (ROUTINE X 2)
Culture: NO GROWTH
Special Requests: ADEQUATE

## 2023-05-15 LAB — SURGICAL PATHOLOGY

## 2023-05-15 NOTE — Transitions of Care (Post Inpatient/ED Visit) (Unsigned)
   05/15/2023  Name: Debra Diaz MRN: [id number] DOB: 10-04-1976  Today's TOC FU Call Status: Today's TOC FU Call Status:: Unsuccessul Call (1st Attempt) Unsuccessful Call (1st Attempt) Date: 05/15/23  Attempted to reach the patient regarding the most recent Inpatient/ED visit.  Follow Up Plan: Additional outreach attempts will be made to reach the patient to complete the Transitions of Care (Post Inpatient/ED visit) call.   Signature Karena Addison, LPN [facility] Nurse Health Advisor Direct Dial [phone]

## 2023-05-16 LAB — CULTURE, BLOOD (ROUTINE X 2)

## 2023-05-16 NOTE — Transitions of Care (Post Inpatient/ED Visit) (Signed)
05/16/2023  Name: Debra Diaz MRN: [id number] DOB: 08-09-1976  Today's TOC FU Call Status: Today's TOC FU Call Status:: Successful TOC FU Call Competed Unsuccessful Call (1st Attempt) Date: 05/15/23 [facility] FU Call Complete Date: 05/16/23  Transition Care Management Follow-up Telephone Call Date of Discharge: 05/14/23 Discharge Facility: [facility]) Type of Discharge: Inpatient Admission Primary Inpatient Discharge Diagnosis:: pancreatitis How have you been since you were released from the hospital?: Better Any questions or concerns?: No  Items Reviewed: Did you receive and understand the discharge instructions provided?: Yes Medications obtained,verified, and reconciled?: Yes (Medications Reviewed) Any new allergies since your discharge?: No Dietary orders reviewed?: Yes Do you have support at home?: Yes People in Home: spouse, child(ren), adult  Medications Reviewed Today: Medications Reviewed Today     Reviewed by Karena Addison, LPN (Licensed Practical Nurse) on 05/16/23 at 1450  Med List Status: <None>   Medication Order Taking? Sig Documenting Provider Last Dose Status Informant  Accu-Chek Softclix Lancets lancets [id number] Yes USE TO CHECK BLOOD SUGAR UP TO FOUR TIMES DAILY Bess Kinds, MD Taking Active Self  acetaminophen (TYLENOL) 325 MG tablet [id number] Yes Take 2 tablets (650 mg total) by mouth every 6 (six) hours as needed for fever. Vonna Drafts, MD Taking Active   apixaban (ELIQUIS) 5 MG TABS tablet [id number] Yes TAKE 1 TABLET(5 MG) BY MOUTH TWICE DAILY  Patient taking differently: Take 5 mg by mouth 2 (two) times daily.   Lanier Prude, MD Taking Active Self  blood glucose meter kit and supplies KIT [id number] Yes Dispense based on patient and insurance preference. Use up to four times daily as directed. (FOR ICD-9 250.00, 250.01). Cora Collum, DO Taking Active Self  Blood Pressure KIT [id number] Yes Check blood pressure twice a  day.  Dx code: Selena Lesser, MD Taking Active Self  glucose blood (ACCU-CHEK GUIDE) test strip [id number] Yes USE TO CHECK BLOOD SUGAR UP TO FOUR TIMES DAILY Bess Kinds, MD Taking Active Self  linaclotide Karlene Einstein) 290 MCG CAPS capsule [id number] Yes Take 1 capsule (290 mcg total) by mouth daily before breakfast.  Patient taking differently: Take 290 mcg by mouth daily as needed (for constipation).   Doree Albee, PA-C Taking Active Self  losartan-hydrochlorothiazide (HYZAAR) 100-25 MG tablet [id number] Yes TAKE 1 TABLET BY MOUTH DAILY Sowell, Apolinar Junes, MD Taking Active Self  metFORMIN (GLUCOPHAGE-XR) 500 MG 24 hr tablet [id number] Yes TAKE 2 TABLETS(1000 MG) BY MOUTH IN THE MORNING AND AT BEDTIME Bess Kinds, MD Taking Active Self  traMADol (ULTRAM) 50 MG tablet [id number] Yes Take 1 tablet (50 mg total) by mouth every 6 (six) hours as needed for moderate pain. Vonna Drafts, MD Taking Active             Home Care and Equipment/Supplies: Were Home Health Services Ordered?: NA Any new equipment or medical supplies ordered?: NA  Functional Questionnaire: Do you need assistance with bathing/showering or dressing?: No Do you need assistance with meal preparation?: No Do you need assistance with eating?: No Do you have difficulty maintaining continence: No Do you need assistance with getting out of bed/getting out of a chair/moving?: No Do you have difficulty managing or taking your medications?: No  Follow up appointments reviewed: PCP Follow-up appointment confirmed?: Yes Date of PCP follow-up appointment?: 05/18/23 Follow-up Provider: [facility] Follow-up appointment confirmed?: NA Do you need transportation to your follow-up appointment?: No Do you understand care options if your condition(s) worsen?: Yes-patient verbalized understanding  SIGNATURE Karena Addison, LPN [facility] Nurse Health Advisor Direct Dial [phone]

## 2023-05-17 LAB — CULTURE, BLOOD (ROUTINE X 2): Culture: NO GROWTH

## 2023-05-18 ENCOUNTER — Encounter: Payer: Self-pay | Admitting: Student

## 2023-05-18 ENCOUNTER — Ambulatory Visit (INDEPENDENT_AMBULATORY_CARE_PROVIDER_SITE_OTHER): Payer: Medicaid Other | Admitting: Student

## 2023-05-18 ENCOUNTER — Other Ambulatory Visit: Payer: Self-pay

## 2023-05-18 VITALS — BP 118/75 | HR 60 | Ht 63.0 in | Wt 205.6 lb

## 2023-05-18 DIAGNOSIS — Z09 Encounter for follow-up examination after completed treatment for conditions other than malignant neoplasm: Secondary | ICD-10-CM | POA: Diagnosis present

## 2023-05-18 MED ORDER — TRAMADOL HCL 50 MG PO TABS: 50.0000 mg | ORAL_TABLET | Freq: Four times a day (QID) | ORAL | 0 refills | Status: DC | PRN

## 2023-05-18 NOTE — Patient Instructions (Addendum)
It was great to see you! Thank you for allowing me to participate in your care!  I recommend that you always bring your medications to each appointment as this makes it easy to ensure we are on the correct medications and helps [place] not miss when refills are needed.  Our plans for today:  - Pain management  Take you tramadol every 6 hours, as needed Make follow up appointment if you develop any concerns or pain is not well controlled.   - Check Up You are due for a check up. Make an appointment so we can look at all your medical issues and health screenings  - Slow Heart Rate (Bradycardia)   Make appointment with you cardiologist to be seen for your bradycardia.    Take care and seek immediate care sooner if you develop any concerns.   Dr. Bess Kinds, MD [facility]   En Espaol  Fue genial verte! Gracias por permitirme participar en su cuidado!  Le recomiendo que siempre traiga sus medicamentos a cada cita, ya que esto hace que sea fcil asegurarse de que estamos tomando los medicamentos correctos y nos ayuda a no perder cuando se necesitan reabastecimientos.  Nuestros planes para hoy:  - Manejo del dolor  Tome tramadol cada 6 horas, segn sea necesario Haga una cita de seguimiento si desarrolla alguna preocupacin o si el dolor no est bien controlado.   - Chequeo Le corresponde un chequeo. Pide una cita para que   - Frecuencia cardaca lenta (bradicardia)   Haga una cita con su cardilogo para que lo vea por su bradicardia.   Tenga cuidado y busque atencin inmediata antes si desarrolla alguna inquietud.   Dr. Bess Kinds, MD [facility]

## 2023-05-18 NOTE — Progress Notes (Unsigned)
  SUBJECTIVE:   CHIEF COMPLAINT / HPI:   Hospital F/u -Admitted 5/22 for galstone pancreatitis > laparoscopic cholecystectomy -Hx of bradycardia w/ episode while admitted -D/c ozempic, f/u w/ Cards for Bradycardia   Today: Pain is poorly controlled and worse w/ basic activity. Eating is still slight, as she doesn't have appetite. Is using tylenol and tramdol for pain, but only taking the tramadol once a day, and tylenol twice a day. She is not getting much relief, but the tramadol works when she takes it. She avoided taking the medicine, because she was worried about becoming addicted. Denies any fever's or systemic symptoms, and BM's have improved since surgery.   Health Maintenance Due for Check up  PERTINENT  PMH / PSH:    Patient Care Team: Bess Kinds, MD as PCP - General (Family Medicine) Pricilla Riffle, MD as PCP - Cardiology (Cardiology) Lanier Prude, MD as PCP - Electrophysiology (Cardiology) Rennis Golden Lisette Abu, MD as Consulting Physician (Cardiology) OBJECTIVE:  BP 118/75   Pulse 60   Ht 5\' 3"  (1.6 m)   Wt 205 lb 9.6 oz (93.3 kg)   SpO2 100%   BMI 36.42 kg/m  Physical Exam Constitutional:      General: She is not in acute distress.    Appearance: Normal appearance. She is not ill-appearing.  Cardiovascular:     Rate and Rhythm: Normal rate and regular rhythm.     Heart sounds: No murmur heard.    No friction rub. No gallop.  Pulmonary:     Effort: Pulmonary effort is normal. No respiratory distress.     Breath sounds: Normal breath sounds. No wheezing, rhonchi or rales.  Abdominal:     General: Bowel sounds are normal. There is no distension.     Palpations: Abdomen is soft.     Tenderness: There is abdominal tenderness.     Comments: Small port incisions located on belly, no erythema or swelling, although patient does have some tenderness to palpation overlying incisions  Neurological:     Mental Status: She is alert.  Psychiatric:        Mood and  Affect: Mood normal.        Behavior: Behavior normal.      ASSESSMENT/PLAN:  Hospital discharge follow-up Assessment & Plan: Patient comes in for hospital follow-up, admitted 5/22 for gallstone pancreatitis, where she received laparoscopic cholecystectomy.  Patient has infrequently been taking pain meds at home, as she was afraid of the addictive potential of the medications.  Discussed the risk with patient and that tramadol better med for this, and patient in agreement.  Patient will start taking meds as prescribed.  Patient is able to eat but has not had an appetite, likely secondary to pain, reports no symptoms or issues when she does eat.  Patient also having normal bowel movements much improved since surgery.  Also discussed discontinuing Ozempic, and following up with cardiology.  Patient has cardiology appointment this month. - Tramadol 50 mg every 6 hours as needed   Other orders -     traMADol HCl; Take 1 tablet (50 mg total) by mouth every 6 (six) hours as needed for moderate pain.  Dispense: 12 tablet; Refill: 0   No follow-ups on file. Bess Kinds, MD 05/20/2023, 2:03 PM PGY-2, [facility]

## 2023-05-20 DIAGNOSIS — Z09 Encounter for follow-up examination after completed treatment for conditions other than malignant neoplasm: Secondary | ICD-10-CM | POA: Insufficient documentation

## 2023-05-20 NOTE — Assessment & Plan Note (Signed)
Patient comes in for hospital follow-up, admitted 5/22 for gallstone pancreatitis, where she received laparoscopic cholecystectomy.  Patient has infrequently been taking pain meds at home, as she was afraid of the addictive potential of the medications.  Discussed the risk with patient and that tramadol better med for this, and patient in agreement.  Patient will start taking meds as prescribed.  Patient is able to eat but has not had an appetite, likely secondary to pain, reports no symptoms or issues when she does eat.  Patient also having normal bowel movements much improved since surgery.  Also discussed discontinuing Ozempic, and following up with cardiology.  Patient has cardiology appointment this month. - Tramadol 50 mg every 6 hours as needed
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
## 2023-05-30 ENCOUNTER — Encounter: Payer: Self-pay | Admitting: Student

## 2023-05-30 ENCOUNTER — Ambulatory Visit: Payer: Medicaid Other | Attending: Student | Admitting: Student

## 2023-05-30 VITALS — BP 130/80 | HR 90 | Ht 62.0 in | Wt 205.2 lb

## 2023-05-30 DIAGNOSIS — R001 Bradycardia, unspecified: Secondary | ICD-10-CM | POA: Diagnosis not present

## 2023-05-30 DIAGNOSIS — I484 Atypical atrial flutter: Secondary | ICD-10-CM | POA: Diagnosis not present

## 2023-05-30 DIAGNOSIS — I10 Essential (primary) hypertension: Secondary | ICD-10-CM

## 2023-05-30 NOTE — Patient Instructions (Signed)
Medication Instructions:  Your physician recommends that you continue on your current medications as directed. Please refer to the Current Medication list given to you today.  *If you need a refill on your cardiac medications before your next appointment, please call your pharmacy*  Lab Work: None ordered If you have labs (blood work) drawn today and your tests are completely normal, you will receive your results only by: MyChart Message (if you have MyChart) OR A paper copy in the mail If you have any lab test that is abnormal or we need to change your treatment, we will call you to review the results.  Follow-Up: At [facility], you and your health needs are our priority.  As part of our continuing mission to provide you with exceptional heart care, we have created designated Provider Care Teams.  These Care Teams include your primary Cardiologist (physician) and Advanced Practice Providers (APPs -  Physician Assistants and Nurse Practitioners) who all work together to provide you with the care you need, when you need it.  Your next appointment:   6 month(s)  Provider:   Cameron Lambert, MD  

## 2023-07-09 ENCOUNTER — Other Ambulatory Visit: Payer: Self-pay | Admitting: Student

## 2023-07-24 ENCOUNTER — Other Ambulatory Visit: Payer: Self-pay | Admitting: Physician Assistant

## 2023-08-30 ENCOUNTER — Ambulatory Visit: Payer: Medicaid Other | Admitting: Student

## 2023-08-30 NOTE — Progress Notes (Deleted)
  SUBJECTIVE:   CHIEF COMPLAINT / HPI:   F/u Surgery -Admitted 5/22 for galstone pancreatitis > laparoscopic cholecystectomy  -F/u 5/31 w/ poor pain control  F/u BP Meds: Losartan-hydrochlorothiazide 100-25  Diabetes: Meds: Metformin 1,000 mg BID Lab Results  Component Value Date   HGBA1C 6.5 (H) 05/10/2023     PERTINENT  PMH / PSH: ***  Past Medical History:  Diagnosis Date   Amenorrhea 02/18/2021   Anemia    Asymptomatic microscopic hematuria 10/22/2020   Atrial flutter (HCC) 02/16/2022   Blood transfusion without reported diagnosis    Breast pain, right 10/03/2017   Chest pain 02/15/2020   Chest pain with high risk of acute coronary syndrome 02/27/2015   Concussion 05/05/2016   Diabetes mellitus without complication (HCC)    Dizziness 04/22/2022   Elevated troponin I level 02/26/2015   Hyperlipidemia    Hypertension    Hypokalemia    Irregular menses 07/02/2017   Leukocytosis 10/03/2016   Morbid obesity (HCC)    Possible exposure to STD 02/11/2020   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   Screening for STD (sexually transmitted disease) 12/26/2021   Tubular adenoma of colon    Vaginal itching 12/26/2021   OBJECTIVE:  There were no vitals taken for this visit. Physical Exam   ASSESSMENT/PLAN:  There are no diagnoses linked to this encounter. No follow-ups on file. Bess Kinds, MD 08/30/2023, 8:08 AM PGY-***, [facility] {    This will disappear when note is signed, click to select method of visit    :1}

## 2023-09-17 ENCOUNTER — Other Ambulatory Visit: Payer: Self-pay | Admitting: Student

## 2023-09-18 ENCOUNTER — Other Ambulatory Visit: Payer: Self-pay

## 2023-09-18 MED ORDER — LINACLOTIDE 290 MCG PO CAPS: 290.0000 ug | ORAL_CAPSULE | Freq: Every day | ORAL | 0 refills | Status: DC

## 2023-09-25 ENCOUNTER — Ambulatory Visit (HOSPITAL_COMMUNITY)
Admission: RE | Admit: 2023-09-25 | Discharge: 2023-09-25 | Disposition: A | Discharge status: Home / Self Care | Payer: Medicaid Other | Source: Ambulatory Visit | Attending: Family Medicine | Admitting: Family Medicine

## 2023-09-25 ENCOUNTER — Ambulatory Visit (INDEPENDENT_AMBULATORY_CARE_PROVIDER_SITE_OTHER): Payer: Medicaid Other | Admitting: Student

## 2023-09-25 ENCOUNTER — Encounter: Payer: Self-pay | Admitting: Student

## 2023-09-25 ENCOUNTER — Other Ambulatory Visit: Payer: Self-pay

## 2023-09-25 VITALS — BP 125/78 | HR 43 | Ht 62.0 in | Wt 210.4 lb

## 2023-09-25 DIAGNOSIS — N644 Mastodynia: Secondary | ICD-10-CM

## 2023-09-25 DIAGNOSIS — R5383 Other fatigue: Secondary | ICD-10-CM

## 2023-09-25 DIAGNOSIS — Z1239 Encounter for other screening for malignant neoplasm of breast: Secondary | ICD-10-CM

## 2023-09-25 DIAGNOSIS — R001 Bradycardia, unspecified: Secondary | ICD-10-CM

## 2023-09-25 MED ORDER — LOSARTAN POTASSIUM-HCTZ 100-25 MG PO TABS: 1.0000 | ORAL_TABLET | Freq: Every day | ORAL | 1 refills | Status: DC

## 2023-09-25 NOTE — Progress Notes (Unsigned)
    SUBJECTIVE:   CHIEF COMPLAINT / HPI:   R breast pain and fatigue Intermittent burning pain that feels deep in R breast for past 4 days. She is concerned it could be related to surgical removal of myxoma in 2016.  Has also been feeling fatigued. Today has felt a little dizzy and has a moderate headache (6-7/10). Took some ibuprofen, didn't help.   PERTINENT  PMH / PSH: Hx a flutter s/p ablation, excision of atrial myxoma  OBJECTIVE:   BP 125/78   Pulse (!) 43   Ht 5\' 2"  (1.575 m)   Wt 210 lb 6.4 oz (95.4 kg)   SpO2 100%   BMI 38.48 kg/m    General: NAD, pleasant, able to participate in exam Cardiac: RRR, no murmurs. Respiratory: CTAB, normal effort, No wheezes, rales or rhonchi Abdomen: Bowel sounds present, nontender, nondistended, no hepatosplenomegaly. Extremities: no edema or cyanosis. Skin: warm and dry, no rashes noted Neuro: alert, no obvious focal deficits Psych: Normal affect and mood  ASSESSMENT/PLAN:   No problem-specific Assessment & Plan notes found for this encounter.     Dr. Erick Alley, DO [facility]    {    This will disappear when note is signed, click to select method of visit    :1}

## 2023-09-25 NOTE — Patient Instructions (Addendum)
It was great to see you! Thank you for allowing me to participate in your care!  I recommend that you always bring your medications to each appointment as this makes it easy to ensure you are on the correct medications and helps [place] not miss when refills are needed.  Our plans for today:  - call to schedule mammogram: [facility] [address] [place],  [place]  [address] Get Driving Directions Main: [phone] - I recommend calling cardiology about the fatigue and dizziness as it may be related to your slow heart rate  - You can try over the counter capsaicin cream for breat pain. If continues return   We are checking some labs today, I will call you if they are abnormal will send you a MyChart message or a letter if they are normal.  If you do not hear about your labs in the next 2 weeks please let us know.  Take care and seek immediate care sooner if you develop any concerns.   Dr. Erick Alley, DO [facility]

## 2023-09-26 DIAGNOSIS — R5383 Other fatigue: Secondary | ICD-10-CM | POA: Insufficient documentation

## 2023-09-26 DIAGNOSIS — N644 Mastodynia: Secondary | ICD-10-CM | POA: Insufficient documentation

## 2023-09-26 NOTE — Assessment & Plan Note (Addendum)
Has only been going on for 4 days and headache only since this morning and not severe.  On chart review has significant history of similar symptoms including fatigue and lightheadedness.  Thought to be related to symptomatic bradycardia.  Already follows with cardiology.  Did obtain EKG in office today to ensure there is no arrhythmia.  EKG showed sinus bradycardia consistent with prior EKGs. -Patient advised to follow-up with cardiology as fatigue and lightheadedness likely associated with bradycardia. -Can also return to [facility] for follow-up if symptoms do not improve

## 2023-09-26 NOTE — Assessment & Plan Note (Signed)
Very nonspecific.  No obvious explanation.  No signs of infection. some rubbery mobile small lesions in breast tissue which are non concerning for malignancy.  Pain is palpable along scar of right breast though it is odd to just now have pain around the scar tissue from a surgery in 2016. -Can trial OTC capsaicin cream for pain -Routine mammogram ordered -Return if pain worsens/does not improve over the next week or so.

## 2023-10-02 ENCOUNTER — Telehealth: Payer: Self-pay | Admitting: *Deleted

## 2023-10-02 ENCOUNTER — Other Ambulatory Visit: Payer: Self-pay | Admitting: Student

## 2023-10-02 NOTE — Telephone Encounter (Signed)
So sorry.  I meant diagnostic mammogram. Jone Baseman, CMA

## 2023-10-02 NOTE — Telephone Encounter (Signed)
Breast center called and they need to have a diagnostic ultrasound placed because pt is having pain in her right breast.  Please change order.  Jone Baseman, CMA

## 2023-10-03 ENCOUNTER — Other Ambulatory Visit: Payer: Self-pay | Admitting: Student

## 2023-10-03 DIAGNOSIS — N644 Mastodynia: Secondary | ICD-10-CM

## 2023-10-03 NOTE — Progress Notes (Signed)
Informed by breast center that the order needs to be placed for diagnostic mammogram instead of routine mammogram as patient has pain in the right breast.  Order placed.

## 2023-10-04 ENCOUNTER — Other Ambulatory Visit: Payer: Self-pay | Admitting: Student

## 2023-10-04 DIAGNOSIS — N644 Mastodynia: Secondary | ICD-10-CM

## 2023-10-16 ENCOUNTER — Ambulatory Visit: Payer: Medicaid Other

## 2023-10-16 ENCOUNTER — Ambulatory Visit
Admission: RE | Admit: 2023-10-16 | Discharge: 2023-10-16 | Disposition: A | Discharge status: Home / Self Care | Payer: Medicaid Other | Source: Ambulatory Visit | Attending: Family Medicine | Admitting: Family Medicine

## 2023-10-16 DIAGNOSIS — N644 Mastodynia: Secondary | ICD-10-CM

## 2023-12-03 NOTE — Progress Notes (Signed)
  Electrophysiology Office Note:   Date:  12/04/2023  ID:  Debra Diaz, DOB 28-Feb-1976, MRN [id number]  Primary Cardiologist: Dietrich Pates, MD Electrophysiologist: Lanier Prude, MD      History of Present Illness:   Debra Diaz is a 47 y.o. 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   Since last being seen in our clinic the patient reports doing well overall. She had one episode of dizziness last week where a pulse ox read HR 32-38. She did not count manually. Otherwise,  she denies chest pain, palpitations, dyspnea, PND, orthopnea, nausea, vomiting, dizziness, syncope, edema, weight gain, or early satiety.   Review of systems complete and found to be negative unless listed in HPI.   EP Information / Studies Reviewed:    EKG is ordered today. Personal review as below.  EKG Interpretation Date/Time:  Tuesday December 04 2023 09:47:06 EST Ventricular Rate:  51 PR Interval:  166 QRS Duration:  80 QT Interval:  438 QTC Calculation: 403 R Axis:   2  Text Interpretation: Sinus bradycardia Minimal voltage criteria for LVH, may be normal variant ( R in aVL ) When compared with ECG of 25-Sep-2023 16:07, No significant change was found Confirmed by Maxine Glenn [phone]) on 12/04/2023 9:57:03 AM    Arrhythmia History  S/p AFL ablation 02/2022  Echo 04/2023 LVEF 60-65%, mild/mod TR  Monitor 04/2023 HR 32 - 138 bpm, average 54 bpm. 1 pause lasting 3.3 seconds. Rare supraventricular and ventricular ectopy. No sustained arrhythmias. No atrial fibrillation. Symptom trigger episodes correspond to sinus rhythm with PVC's.  Physical Exam:   VS:  BP (!) 112/50   Pulse (!) 51   Ht 5\' 2"  (1.575 m)   Wt 211 lb (95.7 kg)   SpO2 96%   BMI 38.59 kg/m    Wt Readings from Last 3 Encounters:  12/04/23 211 lb (95.7 kg)  09/25/23 210 lb 6.4 oz (95.4 kg)  05/30/23 205 lb 3.2 oz  (93.1 kg)     GEN: Well nourished, well developed in no acute distress NECK: No JVD; No carotid bruits CARDIAC: Regular rate and rhythm, no murmurs, rubs, gallops RESPIRATORY:  Clear to auscultation without rales, wheezing or rhonchi  ABDOMEN: Soft, non-tender, non-distended EXTREMITIES:  No edema; No deformity   ASSESSMENT AND PLAN:    Atypical atrial flutter Sinus bradycardia EKG today shows sinus bradycardia Doing well after ablation without recurrence No recurrent on recent monitor, some sinus sinus bradycardia Cotninue eliquis for stroke ppx with CHA2DS2/VASc of at least 5. Stressed importance of TWICE daily eliquis.  Fatigue Symptoms on monitor were associated with NSR and PVCs.  She did have 1 brief pause without sustained bradycardia.  PVCs rare <1%   HTN Stable on current regimen    Follow up with Dr. Lalla Brothers in 6 months  Signed, Graciella Freer, PA-C

## 2023-12-04 ENCOUNTER — Encounter: Payer: Self-pay | Admitting: Student

## 2023-12-04 ENCOUNTER — Ambulatory Visit: Payer: Medicaid Other | Attending: Cardiology | Admitting: Student

## 2023-12-04 VITALS — BP 112/50 | HR 51 | Ht 62.0 in | Wt 211.0 lb

## 2023-12-04 DIAGNOSIS — I484 Atypical atrial flutter: Secondary | ICD-10-CM | POA: Diagnosis not present

## 2023-12-04 DIAGNOSIS — I10 Essential (primary) hypertension: Secondary | ICD-10-CM

## 2023-12-04 DIAGNOSIS — R001 Bradycardia, unspecified: Secondary | ICD-10-CM | POA: Diagnosis not present

## 2023-12-04 MED ORDER — APIXABAN 5 MG PO TABS: 5.0000 mg | ORAL_TABLET | Freq: Two times a day (BID) | ORAL | 11 refills | Status: DC

## 2023-12-04 NOTE — Patient Instructions (Signed)
Medication Instructions:  Your physician recommends that you continue on your current medications as directed. Please refer to the Current Medication list given to you today.  *If you need a refill on your cardiac medications before your next appointment, please call your pharmacy*  Lab Work: None ordered If you have labs (blood work) drawn today and your tests are completely normal, you will receive your results only by: MyChart Message (if you have MyChart) OR A paper copy in the mail If you have any lab test that is abnormal or we need to change your treatment, we will call you to review the results.  Follow-Up: At [facility], you and your health needs are our priority.  As part of our continuing mission to provide you with exceptional heart care, we have created designated Provider Care Teams.  These Care Teams include your primary Cardiologist (physician) and Advanced Practice Providers (APPs -  Physician Assistants and Nurse Practitioners) who all work together to provide you with the care you need, when you need it.  Your next appointment:   6 month(s)  Provider:   Cameron Lambert, MD  

## 2024-01-20 ENCOUNTER — Other Ambulatory Visit: Payer: Self-pay | Admitting: Student

## 2024-03-10 ENCOUNTER — Other Ambulatory Visit: Payer: Self-pay | Admitting: Cardiology

## 2024-03-10 NOTE — Telephone Encounter (Signed)
 Prescription refill request for Eliquis received. Indication: A Flutter Last office visit: 12/04/23  Polly Cobia PA-C Scr: 0.58 on 05/14/23  Epic Age: 48 Weight: 95.7kg  Based on above findings Eliquis 5mg  twice daily is the appropriate dose.  Refill approved.

## 2024-03-11 ENCOUNTER — Ambulatory Visit: Admitting: Student

## 2024-03-26 ENCOUNTER — Encounter: Payer: Self-pay | Admitting: Student

## 2024-03-26 ENCOUNTER — Ambulatory Visit: Admitting: Student

## 2024-03-26 VITALS — BP 148/78 | HR 40 | Ht 62.0 in | Wt 210.0 lb

## 2024-03-26 DIAGNOSIS — Z7985 Long-term (current) use of injectable non-insulin antidiabetic drugs: Secondary | ICD-10-CM | POA: Diagnosis not present

## 2024-03-26 DIAGNOSIS — Z23 Encounter for immunization: Secondary | ICD-10-CM | POA: Diagnosis not present

## 2024-03-26 DIAGNOSIS — E1159 Type 2 diabetes mellitus with other circulatory complications: Secondary | ICD-10-CM | POA: Diagnosis present

## 2024-03-26 LAB — POCT GLYCOSYLATED HEMOGLOBIN (HGB A1C): HbA1c, POC (controlled diabetic range): 7.6 % — AB (ref 0.0–7.0)

## 2024-03-26 MED ORDER — METFORMIN HCL ER 500 MG PO TB24: 1000.0000 mg | ORAL_TABLET | Freq: Two times a day (BID) | ORAL | 3 refills | Status: DC

## 2024-03-26 MED ORDER — SEMAGLUTIDE(0.25 OR 0.5MG/DOS) 2 MG/3ML ~~LOC~~ SOPN: 0.2500 mg | PEN_INJECTOR | SUBCUTANEOUS | 1 refills | Status: DC

## 2024-03-26 NOTE — Progress Notes (Signed)
  SUBJECTIVE:   CHIEF COMPLAINT / HPI:    DM Meds: Metformin 500 mg BID Lab Results  Component Value Date   HGBA1C 7.6 (A) 03/26/2024  Has been working on diet and exercise but is having problems losing weight.    PERTINENT  PMH / PSH:   OBJECTIVE:  BP (!) 148/78   Pulse (!) 40   Ht 5\' 2"  (1.575 m)   Wt 210 lb (95.3 kg)   SpO2 99%   BMI 38.41 kg/m  Physical Exam Constitutional:      General: She is not in acute distress.    Appearance: Normal appearance. She is not ill-appearing.  Cardiovascular:     Rate and Rhythm: Normal rate and regular rhythm.     Pulses: Normal pulses.     Heart sounds: Normal heart sounds. No murmur heard.    No friction rub. No gallop.  Pulmonary:     Effort: Pulmonary effort is normal. No respiratory distress.     Breath sounds: Normal breath sounds. No stridor. No wheezing, rhonchi or rales.  Abdominal:     General: There is no distension.     Palpations: Abdomen is soft. There is no mass.     Tenderness: There is no abdominal tenderness. There is no guarding or rebound.     Hernia: No hernia is present.  Neurological:     Mental Status: She is alert.  Psychiatric:        Mood and Affect: Mood normal.        Behavior: Behavior normal.      ASSESSMENT/PLAN:   Assessment & Plan Type 2 diabetes mellitus with other circulatory complication, without long-term current use of insulin (HCC) Patient comes in for follow-up of her diabetes.  Patient reports difficult time losing weight, but has been working on diet and exercise.  Patient A1c 7.6 previously 7.4.  Patient notes that she is only taking 500 mg of metformin twice daily, and tolerating well.  Will increase doses of metformin to 1000 mg twice daily.  Will also start Ozempic for diabetes control and weight management. - Metformin 1000 mg twice daily - Ozempic 0.25 mg weekly - Follow-up 1 month No follow-ups on file. Bess Kinds, MD 03/26/2024, 11:35 AM PGY-3, [facility]  Med[facility]

## 2024-03-26 NOTE — Assessment & Plan Note (Addendum)
 Patient comes in for follow-up of her diabetes.  Patient reports difficult time losing weight, but has been working on diet and exercise.  Patient A1c 7.6 previously 7.4.  Patient notes that she is only taking 500 mg of metformin twice daily, and tolerating well.  Will increase doses of metformin to 1000 mg twice daily.  Will also start Ozempic for diabetes control and weight management. - Metformin 1000 mg twice daily - Ozempic 0.25 mg weekly - Follow-up 1 month

## 2024-03-26 NOTE — Patient Instructions (Addendum)
 It was great to see you! Thank you for allowing me to participate in your care!  Our plans for today:  - Diabetes First: Take 1000 mg of metformin in the morning and 500 mg of metformin in the evening for 1 week  Then: Take 1000 mg of metformin in the morning and 1000 mg of metformin in the evening  We are going to start Ozempic. Your insurance should cover it, if it does not, we will work with the pharmacy team to see if we can get it covered.   Take 0.25 mg once a week  Follow up in 1 month   Take care and seek immediate care sooner if you develop any concerns.   Dr. Bess Kinds, MD [facility]   En Carmelia Bake gusto verte! Gracias por permitirme participar en tu atencin!  Nuestros planes para hoy: - Diabetes Primero: Tomar 1000 mg de metformina por la maana y 500 mg de metformina por la noche durante 1 semana.  Luego: Tomar 1000 mg de metformina por la maana y 1000 mg de metformina por la noche.  Comenzaremos con Ozempic. Tu seguro debera cubrirlo; si no, consultaremos con el equipo de farmacia para ver si podemos cubrirlo. Tomar 0.25 mg una vez a la semana.  Visita de seguimiento en 1 mes.  Cudate y busca atencin mdica inmediata lo antes posible si tienes alguna inquietud.  Dr. Bess Kinds, MD [facility]

## 2024-03-31 ENCOUNTER — Telehealth: Payer: Self-pay

## 2024-03-31 NOTE — Telephone Encounter (Signed)
 Pharmacy Patient Advocate Encounter  Received notification from [facility] MEDICAID that Prior Authorization for OZEMPIC 0.25/0.5MG  has been APPROVED from 03/31/24 to 03/31/25   PA #/Case ID/Reference #:  [id number]

## 2024-03-31 NOTE — Telephone Encounter (Signed)
 Pharmacy Patient Advocate Encounter   Received notification from CoverMyMeds that prior authorization for OZEMPIC 0.25/0.5MG  is required/requested.   Insurance verification completed.   The patient is insured through [facility] MEDICAID .   PA required; PA submitted to above mentioned insurance via CoverMyMeds Key/confirmation #/EOC [id number]. Status is pending

## 2024-05-20 ENCOUNTER — Ambulatory Visit: Admitting: Student

## 2024-05-20 NOTE — Patient Instructions (Incomplete)
 It was great to see you! Thank you for allowing me to participate in your care!  I recommend that you always bring your medications to each appointment as this makes it easy to ensure we are on the correct medications and helps us  not miss when refills are needed.  Our plans for today:  - Diabetes  Continue Metformin  1000 mg twice a day Continue Ozempic  0.25 mg qwk Checking urine for signs of diabetic disease Referring you to an eye doctor for an eye exam -   We are checking some labs today, I will call you if they are abnormal will send you a MyChart message or a letter if they are normal.  If you do not hear about your labs in the next 2 weeks please let us  know.***  Take care and seek immediate care sooner if you develop any concerns.   Dr. Wilhemena Harbour, MD [facility]

## 2024-05-20 NOTE — Progress Notes (Deleted)
  SUBJECTIVE:   CHIEF COMPLAINT / HPI:   DM2 -Meds: Metformin  1000 mg BID, Oempic 0.25 mg qwk  PERTINENT  PMH / PSH: ***  OBJECTIVE:  There were no vitals taken for this visit. ***  ASSESSMENT/PLAN:   Assessment & Plan  No follow-ups on file. Wilhemena Harbour, MD 05/20/2024, 9:40 AM PGY-***, [facility] {    This will disappear when note is signed, click to select method of visit    :1}

## 2024-05-27 ENCOUNTER — Encounter: Payer: Self-pay | Admitting: *Deleted

## 2024-06-09 ENCOUNTER — Ambulatory Visit (INDEPENDENT_AMBULATORY_CARE_PROVIDER_SITE_OTHER): Admitting: Family Medicine

## 2024-06-09 VITALS — BP 118/75 | HR 55 | Ht 62.0 in | Wt 211.1 lb

## 2024-06-09 DIAGNOSIS — E1159 Type 2 diabetes mellitus with other circulatory complications: Secondary | ICD-10-CM | POA: Diagnosis present

## 2024-06-09 DIAGNOSIS — M65342 Trigger finger, left ring finger: Secondary | ICD-10-CM

## 2024-06-09 DIAGNOSIS — M65332 Trigger finger, left middle finger: Secondary | ICD-10-CM | POA: Diagnosis not present

## 2024-06-09 MED ORDER — ACCU-CHEK SOFTCLIX LANCET DEV KIT: PACK | 0 refills | Status: AC

## 2024-06-09 MED ORDER — ACCU-CHEK SOFTCLIX LANCETS MISC: 3 refills | Status: AC

## 2024-06-09 MED ORDER — ACCU-CHEK GUIDE W/DEVICE KIT
PACK | 0 refills | Status: DC
Start: 2024-06-09 — End: 2024-10-15

## 2024-06-09 MED ORDER — GLUCOSE BLOOD VI STRP: ORAL_STRIP | 12 refills | Status: AC

## 2024-06-09 MED ORDER — METFORMIN HCL ER 500 MG PO TB24: 1000.0000 mg | ORAL_TABLET | Freq: Every day | ORAL | 0 refills | Status: DC

## 2024-06-09 NOTE — Progress Notes (Signed)
    SUBJECTIVE:   CHIEF COMPLAINT / HPI: Type 2 diabetes follow-up  Dr. Jennelle wrote to increase metformin  to 1000 BID. Patient did not increase, is still doing 500 once daily. Not on taking ozempic , was unable to pick up from pharmacy. Per chart review insurance approved on 04/14.  Finger pain Finger in 3 and 4th digits of left hand Worse at night Has noticed locking sensation in those fingers with flexion. This causes pain. Not taking any medicine. Going on 3 months. No redness or swelling.  PERTINENT  PMH / PSH: T2DM  OBJECTIVE:   BP 118/75   Pulse (!) 51   Ht 5' 2 (1.575 m)   Wt 211 lb 2 oz (95.8 kg)   SpO2 98%   BMI 38.62 kg/m   General: NAD, well appearing Neuro: A&O Respiratory: normal WOB on RA Extremities: Moving all 4 extremities equally Left Digits: No obvious erythema swelling of the 3rd and 4th digits, and full flexion of both the 3rd and 4th digits of the finger becomes locked at PIP, easily reversed with extension of the digits   ASSESSMENT/PLAN:   Assessment & Plan Type 2 diabetes mellitus with other circulatory complication, without long-term current use of insulin  (HCC) Clarify confusion regarding intended medication regimen.  Discussed in depth increase metformin  to 1000 mg XR daily.  Recommended patient now pick up Ozempic  from the pharmacy as it has been approved by her insurance.  Follow-up 4 weeks for repeat A1c. Trigger middle finger of left hand Trigger ring finger of left hand Exam consistent with trigger finger of the A3 pulley of both the 3rd and 4th digits of the left hand.  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  Discussed that she is a poor candidate for conservative treatment given her work and recommended we schedule her for corticosteroid injections.  Patient agreeable to plan.  Patient scheduled for appointment with me July 28.  Recommended she treat as needed with Tylenol .  Also discussed that she may  buddy tape her fingers to prevent them from locking at night while she sleeps.  Return in about 4 weeks (around 07/07/2024).  Debra Provencal, MD [facility]

## 2024-06-09 NOTE — Assessment & Plan Note (Signed)
 Clarify confusion regarding intended medication regimen.  Discussed in depth increase metformin  to 1000 mg XR daily.  Recommended patient now pick up Ozempic  from the pharmacy as it has been approved by her insurance.  Follow-up 4 weeks for repeat A1c.

## 2024-06-09 NOTE — Patient Instructions (Addendum)
 It was great to see you! Thank you for allowing me to participate in your care!  Our plans for today:  - Please take Tylenol  as needed to help with your pain while you are working. - You have an appointment listed below with me for your injections of your fingers to help with pain. - Please take 1000 mg or 2 pills of your metformin  daily to help your diabetes. - You should be able to pick up Ozempic , the once weekly shot, from your pharmacy. - We will check your A1c at your visit.   Please arrive 15 minutes PRIOR to your next scheduled appointment time! If you do not, this affects OTHER patients' care.  Take care and seek immediate care sooner if you develop any concerns.   Debra Provencal, MD, PGY-2 [facility] 3:34 PM 06/09/2024  [facility]

## 2024-06-10 LAB — BASIC METABOLIC PANEL WITH GFR
BUN/Creatinine Ratio: 29 — ABNORMAL HIGH (ref 9–23)
BUN: 18 mg/dL (ref 6–24)
CO2: 25 mmol/L (ref 20–29)
Calcium: 9.8 mg/dL (ref 8.7–10.2)
Chloride: 99 mmol/L (ref 96–106)
Creatinine, Ser: 0.62 mg/dL (ref 0.57–1.00)
Glucose: 160 mg/dL — ABNORMAL HIGH (ref 70–99)
Potassium: 4.1 mmol/L (ref 3.5–5.2)
Sodium: 141 mmol/L (ref 134–144)
eGFR: 109 mL/min/{1.73_m2} (ref >59)

## 2024-06-11 ENCOUNTER — Ambulatory Visit: Payer: Self-pay | Admitting: Family Medicine

## 2024-07-14 ENCOUNTER — Ambulatory Visit: Payer: Self-pay | Admitting: Family Medicine

## 2024-07-14 NOTE — Progress Notes (Deleted)
    SUBJECTIVE:   CHIEF COMPLAINT / HPI: trigger finger  ***  PERTINENT  PMH / PSH: Atrial flutter, T2DM, Hx of atrial myxoma, Trigger finger  OBJECTIVE:   There were no vitals taken for this visit.  ***  ASSESSMENT/PLAN:   Assessment & Plan  No follow-ups on file.  PROCEDURE: INJECTION: Patient was given informed consent, signed copy in the chart. Appropriate time out was taken. Area prepped and draped in usual sterile fashion. Ethyl chloride was  used for local anesthesia. A 21 ***gauge 1 1/2 inch needle was used.. *** cc of methylprednisolone  40 mg/ml plus  *** cc of 1% lidocaine  without epinephrine  was injected into the *** using a(n) *** approach.   The patient tolerated the procedure well. There were no complications. Post procedure instructions were given.    Debra Provencal, MD [facility]

## 2024-08-11 ENCOUNTER — Ambulatory Visit: Admitting: Family Medicine

## 2024-08-11 VITALS — BP 138/84 | HR 54 | Ht 62.0 in | Wt 209.0 lb

## 2024-08-11 DIAGNOSIS — K5900 Constipation, unspecified: Secondary | ICD-10-CM | POA: Diagnosis not present

## 2024-08-11 DIAGNOSIS — R0789 Other chest pain: Secondary | ICD-10-CM | POA: Diagnosis not present

## 2024-08-11 DIAGNOSIS — R001 Bradycardia, unspecified: Secondary | ICD-10-CM

## 2024-08-11 DIAGNOSIS — E1159 Type 2 diabetes mellitus with other circulatory complications: Secondary | ICD-10-CM

## 2024-08-11 LAB — POCT GLYCOSYLATED HEMOGLOBIN (HGB A1C): HbA1c, POC (controlled diabetic range): 7.7 % — AB (ref 0.0–7.0)

## 2024-08-11 MED ORDER — SEMAGLUTIDE(0.25 OR 0.5MG/DOS) 2 MG/3ML ~~LOC~~ SOPN: 0.2500 mg | PEN_INJECTOR | SUBCUTANEOUS | 1 refills | Status: DC

## 2024-08-11 MED ORDER — LINACLOTIDE 290 MCG PO CAPS: 290.0000 ug | ORAL_CAPSULE | Freq: Every day | ORAL | 2 refills | Status: DC

## 2024-08-11 NOTE — Patient Instructions (Addendum)
 Me alegra verte hoy. Gracias por venir.  Asuntos que hablamos hoy:  1) Por favor, aumenta tu dosis de metformina a 1000 mg dos veces al [employer].  2) Te reenvo tu receta de Ozempic .  3) Por favor, comuncate con tu cardilogo para informarle que tienes una frecuencia cardaca lenta.  Vuelve a verme en 3 meses.    Good to see you today - Thank you for coming in  Things we discussed today:  1) Please increase your metformin  to 1000mg  twice a day  2) I am re-sending your prescription for ozempic   3) Please reach out to your cardiologist to let them know that you are having slow heart rate.

## 2024-08-11 NOTE — Assessment & Plan Note (Deleted)
-   Patient with episodes of chest tightness, dizziness, HR reported in 30s on home pulse oximeter. - Advised to call 911 or go to ED if recurrent severe chest pain, syncope, or HR <40. - Contacted cardiology to expedite appointment (currently scheduled for 10/30). Also encouraged patient to call cardiologist to move appointment up.  - Consider Zio monitor after cardiology evaluation.
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
## 2024-08-11 NOTE — Assessment & Plan Note (Addendum)
-   A1c today 7.7. - Checked urine albumin /creatinine ratio. - Increase metformin  to 1000 mg BID. - Ozempic  refill resent - Continue BG checks; encourage more frequent monitoring - Follow up in 3 months for repeat A1c.

## 2024-08-11 NOTE — Progress Notes (Unsigned)
    SUBJECTIVE:   CHIEF COMPLAINT / HPI:   ***  Debra Diaz is a 48yo F that pf T2DM f/u.   A1c  PERTINENT  PMH / PSH: ***  OBJECTIVE:   There were no vitals taken for this visit.  ***  ASSESSMENT/PLAN:   Assessment & Plan      Twyla Nearing, MD [facility]

## 2024-08-11 NOTE — Assessment & Plan Note (Signed)
-   Continue Linzess ; refill sent.

## 2024-08-12 ENCOUNTER — Ambulatory Visit: Payer: Self-pay

## 2024-08-12 ENCOUNTER — Telehealth: Payer: Self-pay | Admitting: Family Medicine

## 2024-08-12 LAB — MICROALBUMIN / CREATININE URINE RATIO
Creatinine, Urine: 113.2 mg/dL
Microalb/Creat Ratio: 5 mg/g{creat} (ref 0–29)
Microalbumin, Urine: 6.1 ug/mL

## 2024-08-12 NOTE — Assessment & Plan Note (Signed)
 Chronic hx of sinus bradycardia. Had acute exaccerbation which self-resolved and VSS today.  - Patient with episodes of chest tightness, dizziness, HR reported in 30s on home pulse oximeter. - ED precautions counseled. Advised to call 911 or go to ED if recurrent severe chest pain, syncope, or HR <40. - Contacted cardiology (Dr. Cindie) to expedite appointment (currently scheduled for 10/30). Also encouraged patient to call cardiologist to move appointment up.  - Consider Zio monitor after cardiology evaluation.

## 2024-08-12 NOTE — Telephone Encounter (Signed)
 Called pt to schedule her for an EKG. She is agreeable and plans to come back at 1030 AM today, EKG order pended.   Additionally, pt reports that her pharmacy is out of stock of ozempic . Asked pt tocall back to see when they would have ozempic  restocked OR if  other pharmacies in the area have it in stock .

## 2024-08-13 NOTE — Assessment & Plan Note (Signed)
 A1c today 7.7, above goal but stable  - Checked urine albumin /creatinine ratio. - Increase metformin  to 1000 mg BID. - Ozempic  refill resent (0.25mg  weekly) - Continue BG checks; encourage more frequent monitoring - Follow up in 3 months for repeat A1c.

## 2024-08-13 NOTE — Assessment & Plan Note (Signed)
 Chronic hx of sinus bradycardia s/p ablation and follows w/ Cardiology for this. Had acute exaccerbation which self-resolved and VSS today.  - ED precautions counseled. Advised to call 911 or go to ED if recurrent severe chest pain, syncope, or HR <40. - Contacted cardiology (Dr. Cindie) to expedite appointment (currently scheduled for 10/30). Also encouraged patient to call cardiologist to move appointment up.  - Consider Zio monitor after cardiology evaluation.

## 2024-08-13 NOTE — Assessment & Plan Note (Signed)
-   Continue Linzess ; refill sent.
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
## 2024-08-14 ENCOUNTER — Encounter: Payer: Self-pay | Admitting: Family Medicine

## 2024-08-25 ENCOUNTER — Encounter: Payer: Self-pay | Admitting: Family Medicine

## 2024-08-25 ENCOUNTER — Other Ambulatory Visit: Payer: Self-pay | Admitting: Family Medicine

## 2024-08-25 ENCOUNTER — Ambulatory Visit: Attending: Family Medicine

## 2024-08-25 DIAGNOSIS — R001 Bradycardia, unspecified: Secondary | ICD-10-CM

## 2024-08-25 NOTE — Progress Notes (Unsigned)
 EP to read.

## 2024-08-28 ENCOUNTER — Emergency Department (HOSPITAL_BASED_OUTPATIENT_CLINIC_OR_DEPARTMENT_OTHER)

## 2024-08-28 ENCOUNTER — Other Ambulatory Visit: Payer: Self-pay

## 2024-08-28 ENCOUNTER — Observation Stay (HOSPITAL_BASED_OUTPATIENT_CLINIC_OR_DEPARTMENT_OTHER)
Admission: EM | Admit: 2024-08-28 | Discharge: 2024-08-29 | Disposition: A | Discharge status: Home / Self Care | Attending: Internal Medicine | Admitting: Internal Medicine

## 2024-08-28 ENCOUNTER — Encounter (HOSPITAL_BASED_OUTPATIENT_CLINIC_OR_DEPARTMENT_OTHER): Payer: Self-pay | Admitting: *Deleted

## 2024-08-28 DIAGNOSIS — E119 Type 2 diabetes mellitus without complications: Secondary | ICD-10-CM | POA: Insufficient documentation

## 2024-08-28 DIAGNOSIS — I10 Essential (primary) hypertension: Secondary | ICD-10-CM | POA: Diagnosis not present

## 2024-08-28 DIAGNOSIS — Z743 Need for continuous supervision: Secondary | ICD-10-CM | POA: Diagnosis not present

## 2024-08-28 DIAGNOSIS — Z87891 Personal history of nicotine dependence: Secondary | ICD-10-CM | POA: Diagnosis not present

## 2024-08-28 DIAGNOSIS — R001 Bradycardia, unspecified: Secondary | ICD-10-CM | POA: Diagnosis present

## 2024-08-28 DIAGNOSIS — Z79899 Other long term (current) drug therapy: Secondary | ICD-10-CM | POA: Diagnosis not present

## 2024-08-28 DIAGNOSIS — R079 Chest pain, unspecified: Secondary | ICD-10-CM | POA: Diagnosis present

## 2024-08-28 DIAGNOSIS — F10920 Alcohol use, unspecified with intoxication, uncomplicated: Secondary | ICD-10-CM | POA: Insufficient documentation

## 2024-08-28 DIAGNOSIS — R5383 Other fatigue: Secondary | ICD-10-CM | POA: Diagnosis not present

## 2024-08-28 LAB — PREGNANCY, URINE: Preg Test, Ur: NEGATIVE

## 2024-08-28 LAB — CBC
HCT: 42.4 % (ref 36.0–46.0)
Hemoglobin: 14 g/dL (ref 12.0–15.0)
MCH: 28.1 pg (ref 26.0–34.0)
MCHC: 33 g/dL (ref 30.0–36.0)
MCV: 85.1 fL (ref 80.0–100.0)
Platelets: 256 K/uL (ref 150–400)
RBC: 4.98 MIL/uL (ref 3.87–5.11)
RDW: 13.6 % (ref 11.5–15.5)
WBC: 7.5 K/uL (ref 4.0–10.5)
nRBC: 0 % (ref 0.0–0.2)

## 2024-08-28 LAB — COMPREHENSIVE METABOLIC PANEL WITH GFR
ALT: 46 U/L — ABNORMAL HIGH (ref 0–44)
AST: 42 U/L — ABNORMAL HIGH (ref 15–41)
Albumin: 3.9 g/dL (ref 3.5–5.0)
Alkaline Phosphatase: 109 U/L (ref 38–126)
Anion gap: 15 (ref 5–15)
BUN: 14 mg/dL (ref 6–20)
CO2: 21 mmol/L — ABNORMAL LOW (ref 22–32)
Calcium: 9.3 mg/dL (ref 8.9–10.3)
Chloride: 103 mmol/L (ref 98–111)
Creatinine, Ser: 0.59 mg/dL (ref 0.44–1.00)
GFR, Estimated: >60 mL/min (ref >60)
Glucose, Bld: 126 mg/dL — ABNORMAL HIGH (ref 70–99)
Potassium: 3.8 mmol/L (ref 3.5–5.1)
Sodium: 139 mmol/L (ref 135–145)
Total Bilirubin: 0.4 mg/dL (ref 0.0–1.2)
Total Protein: 6.7 g/dL (ref 6.5–8.1)

## 2024-08-28 LAB — MAGNESIUM: Magnesium: 2 mg/dL (ref 1.7–2.4)

## 2024-08-28 LAB — TROPONIN T, HIGH SENSITIVITY
Troponin T High Sensitivity: <15 ng/L (ref 0–19)
Troponin T High Sensitivity: <15 ng/L (ref 0–19)

## 2024-08-28 MED ORDER — ACETAMINOPHEN 325 MG PO TABS: 650.0000 mg | ORAL_TABLET | ORAL | Status: DC | PRN

## 2024-08-28 MED ORDER — WOMENS MULTIVITAMIN PO TABS: ORAL_TABLET | Freq: Every day | ORAL | Status: DC

## 2024-08-28 MED ORDER — HYDROCHLOROTHIAZIDE 25 MG PO TABS
25.0000 mg | ORAL_TABLET | Freq: Every day | ORAL | Status: DC
  Administered 2024-08-28 – 2024-08-29 (×2): 25 mg via ORAL
  Filled 2024-08-28 (×2): qty 1

## 2024-08-28 MED ORDER — ONDANSETRON HCL 4 MG/2ML IJ SOLN: 4.0000 mg | Freq: Four times a day (QID) | INTRAMUSCULAR | Status: DC | PRN

## 2024-08-28 MED ORDER — LOSARTAN POTASSIUM 50 MG PO TABS
100.0000 mg | ORAL_TABLET | Freq: Every day | ORAL | Status: DC
  Administered 2024-08-28 – 2024-08-29 (×2): 100 mg via ORAL
  Filled 2024-08-28 (×2): qty 2

## 2024-08-28 MED ORDER — LOSARTAN POTASSIUM-HCTZ 100-25 MG PO TABS: 1.0000 | ORAL_TABLET | Freq: Every day | ORAL | Status: DC

## 2024-08-28 MED ORDER — APIXABAN 5 MG PO TABS
5.0000 mg | ORAL_TABLET | Freq: Two times a day (BID) | ORAL | Status: DC
  Administered 2024-08-28 – 2024-08-29 (×2): 5 mg via ORAL
  Filled 2024-08-28 (×2): qty 1

## 2024-08-28 MED ORDER — LINACLOTIDE 145 MCG PO CAPS
290.0000 ug | ORAL_CAPSULE | Freq: Every day | ORAL | Status: DC
  Administered 2024-08-29: 290 ug via ORAL
  Filled 2024-08-28 (×2): qty 2
  Filled 2024-08-28: qty 1

## 2024-08-28 MED ORDER — METFORMIN HCL ER 500 MG PO TB24
1000.0000 mg | ORAL_TABLET | Freq: Every day | ORAL | Status: DC
  Administered 2024-08-29: 1000 mg via ORAL
  Filled 2024-08-28: qty 2

## 2024-08-28 MED ORDER — ADULT MULTIVITAMIN W/MINERALS CH
1.0000 | ORAL_TABLET | Freq: Every day | ORAL | Status: DC
  Administered 2024-08-29: 1 via ORAL
  Filled 2024-08-28: qty 1

## 2024-08-28 NOTE — ED Provider Notes (Signed)
 [place] EMERGENCY DEPARTMENT AT [facility] Provider Note   CSN: [id number] Arrival date & time: 08/28/24  9089     Patient presents with: No chief complaint on file.   Debra Diaz is a 48 y.o. female.   HPI   48 year old female presents emergency department with complaints of chest pain, low heart rate, lightheadedness/generalized fatigue.  States for the past 2 to 3 weeks, has had episodes where she has had chest discomfort, feelings of lightheadedness and then will check her heart rate on her watch and it would be low sometimes in the 30s/40s.  Talk to her primary care who ordered a Zio patch and referred to cardiology for further assessment.  Patient states that her appointment is not until late October.  Had episode today when she was taking her children to school Central chest pressure feelings of lightheadedness.  Checked her heart rate and it was in the 30s prompting visit to the emergency department.  Denies any shortness of breath, abdominal pain, nausea, vomiting.  Denies any history of similar symptoms prior to the past few weeks.  Denies any recent changes to her at home medications.  Past medical history significant for atrial flutter with ablation 3/23 as well as atrial myxoma s/p resection in 2016, diabetes mellitus, hypertension, hyperlipidemia, bradycardia  Prior to Admission medications   Medication Sig Start Date End Date Taking? Authorizing Provider  Accu-Chek Softclix Lancets lancets Use to check blood sugar 3 times daily 06/09/24   Alba Sharper, MD  acetaminophen  (TYLENOL ) 325 MG tablet Take 2 tablets (650 mg total) by mouth every 6 (six) hours as needed for fever. 05/14/23   Romelle Booty, MD  apixaban  (ELIQUIS ) 5 MG TABS tablet TAKE 1 TABLET(5 MG) BY MOUTH TWICE DAILY 03/10/24   Okey Vina GAILS, MD  Blood Glucose Monitoring Suppl (ACCU-CHEK GUIDE) w/Device KIT Use to check blood sugar 3 times per day 06/09/24   Alba Sharper, MD  Blood  Pressure KIT Check blood pressure twice a day.  Dx code: I10 11/18/20   Hope Merle, MD  glucose blood test strip Use as instructed 06/09/24   Alba Sharper, MD  Lancets Misc. (ACCU-CHEK SOFTCLIX LANCET DEV) KIT Use to check blood sugar 3 times per day 06/09/24   Alba Sharper, MD  linaclotide  (LINZESS ) 290 MCG CAPS capsule Take 1 capsule (290 mcg total) by mouth daily before breakfast. 08/11/24   Elicia Hamlet, MD  losartan -hydrochlorothiazide  (HYZAAR) 100-25 MG tablet TAKE 1 TABLET BY MOUTH DAILY 01/22/24   Jennelle Riis, MD  metFORMIN  (GLUCOPHAGE -XR) 500 MG 24 hr tablet Take 2 tablets (1,000 mg total) by mouth daily with breakfast. 06/09/24   Alba Sharper, MD  Semaglutide ,0.25 or 0.5MG /DOS, 2 MG/3ML SOPN Inject 0.25 mg into the skin once a week. 08/11/24   Elicia Hamlet, MD    Allergies: Connell    Review of Systems  All other systems reviewed and are negative.   Updated Vital Signs There were no vitals taken for this visit.  Physical Exam Vitals and nursing note reviewed.  Constitutional:      General: She is not in acute distress.    Appearance: She is well-developed.  HENT:     Head: Normocephalic and atraumatic.  Eyes:     Conjunctiva/sclera: Conjunctivae normal.  Cardiovascular:     Rate and Rhythm: Regular rhythm. Bradycardia present.     Pulses: Normal pulses.     Heart sounds: No murmur heard.    Comments: Heart rate mid 40s during exam. Pulmonary:  Effort: Pulmonary effort is normal. No respiratory distress.     Breath sounds: Normal breath sounds. No wheezing, rhonchi or rales.  Chest:     Chest wall: No tenderness.  Abdominal:     Palpations: Abdomen is soft.     Tenderness: There is no abdominal tenderness.  Musculoskeletal:        General: No swelling.     Cervical back: Neck supple.  Skin:    General: Skin is warm and dry.     Capillary Refill: Capillary refill takes less than 2 seconds.  Neurological:     Mental Status: She is alert.   Psychiatric:        Mood and Affect: Mood normal.     (all labs ordered are listed, but only abnormal results are displayed) Labs Reviewed - No data to display  EKG: None  Radiology: No results found.   Procedures   Medications Ordered in the ED - No data to display  Clinical Course as of 08/28/24 1516  Thu Aug 28, 2024  13[facility] cardiology Dr. Shlomo.  Agreed with admission direct admit through cardiology with attending being Dr. Okey as she is rounding this week and is patient's cardiologist.  No need for intervention at this time per recommendations. [CR]    Clinical Course User Index [CR] Silver Wonda LABOR, PA                                 Medical Decision Making Amount and/or Complexity of Data Reviewed Labs: ordered. Radiology: ordered.  Risk Decision regarding hospitalization.   This patient presents to the ED for concern of chest pain, with heart rate, this involves an extensive number of treatment options, and is a complaint that carries with it a high risk of complications and morbidity.  The differential diagnosis includes ACS, PE, pneumothorax, pericarditis/myocarditis/tamponade, bradycardia, medication side effect, other   Co morbidities that complicate the patient evaluation  See HPI   Additional history obtained:  Additional history obtained from EMR External records from outside source obtained and reviewed including hospital records   Lab Tests:  I Ordered, and personally interpreted labs.  The pertinent results include: No leukocytosis.  No evidence of anemia.  Platelets within normal range.  Mild decrease in bicarb of 21 otherwise, electrolytes with normal limits.  Nonspecific very mild elevation of AST, ALT 42 and 46 respectively.  No renal dysfunction.  Urine pregnancy negative.  Initial troponin of less than 15 with repeat less than 15.   Imaging Studies ordered:  I ordered imaging studies including chest x-ray I independently  visualized and interpreted imaging which showed no acute cardiopulmonary abnormality I agree with the radiologist interpretation   Cardiac Monitoring: / EKG:  The patient was maintained on a cardiac monitor.  I personally viewed and interpreted the cardiac monitored which showed an underlying rhythm of: Sinus rhythm.  Abnormal R wave progression early transition.  LVH  Consultations Obtained:  See ED course  Problem List / ED Course / Critical interventions / Medication management  Symptomatic bradycardia, lightheadedness, chest pain Reevaluation of the patient showed that the patient stayed the same I have reviewed the patients home medicines and have made adjustments as needed   Social Determinants of Health:  Former cigarette use.  Denies illicit drug use.   Test / Admission - Considered:  Symptomatic bradycardia, lightheadedness, chest pain Vitals signs significant for bradycardia. Otherwise within normal range and  stable throughout visit. Laboratory/imaging studies significant for: See above  48 year old female presents emergency department with complaints of chest pain, low heart rate, lightheadedness/generalized fatigue.  States for the past 2 to 3 weeks, has had episodes where she has had chest discomfort, feelings of lightheadedness and then will check her heart rate on her watch and it would be low sometimes in the 30s/40s.  Talk to her primary care who ordered a Zio patch and referred to cardiology for further assessment.  Patient states that her appointment is not until late October.  Had episode today when she was taking her children to school Central chest pressure feelings of lightheadedness.  Checked her heart rate and it was in the 30s prompting visit to the emergency department.  Denies any shortness of breath, abdominal pain, nausea, vomiting.  Denies any history of similar symptoms prior to the past few weeks.  Denies any recent changes to her at home medications. On  exam, low regular heart rate present concerning for sinus bradycardia.  Abdomen nontender.  Lungs clear to oscillation bilaterally.  Labs overall unremarkable for any acute emergent process.  Tells me that her troponin, lack of acute ischemic change on EKG; low suspicion for ACS.  Patient Wells PE 0 PERC negative; low suspicion for PE.  Chest x-ray without obvious pneumonia, pneumothorax or other acute cardiopulmonary abnormality.  Suspect that patient's chest discomfort could be related to how low her heart rate is.  Suspect the patient's generalized fatigue, lightheadedness especially in the setting of exertion/positional changes most likely related to low heart rate.  Patient's heart rate remained in the upper 30s/40s through ED visit.  Given patient's symptomatic bradycardia, consulted cardiology regarding the patient who recommended admission.  Agreed to direct admit.  Treatment plan discussed with patient and she knowledge understanding was agreeable.  Patient stable upon admission.      Final diagnoses:  None    ED Discharge Orders     None          Silver Wonda LABOR, [place] 08/28/24 1519    Neysa Caron PARAS, DO 08/28/24 1538

## 2024-08-28 NOTE — ED Notes (Signed)
 Call back to lab regarding delay with results from [facility]
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
## 2024-08-28 NOTE — ED Triage Notes (Signed)
 Pt to ED reporting fatigue and intermittent dizziness at home starting yesterday. Patient checked her heart rate and reports a heart rate of 33 at home.

## 2024-08-28 NOTE — ED Notes (Signed)
 [facility] in ED preparing pt for transfer

## 2024-08-28 NOTE — ED Notes (Signed)
Lauren with [facility] called for transport 

## 2024-08-28 NOTE — ED Notes (Signed)
 Light green top recollect was taken to lab at 1111, follow up with lab regarding running CMP which is ordered.

## 2024-08-28 NOTE — ED Notes (Signed)
  at bedside

## 2024-08-29 ENCOUNTER — Observation Stay

## 2024-08-29 ENCOUNTER — Other Ambulatory Visit (HOSPITAL_COMMUNITY): Payer: Self-pay

## 2024-08-29 ENCOUNTER — Telehealth: Payer: Self-pay | Admitting: Cardiology

## 2024-08-29 ENCOUNTER — Other Ambulatory Visit: Payer: Self-pay

## 2024-08-29 DIAGNOSIS — E1169 Type 2 diabetes mellitus with other specified complication: Secondary | ICD-10-CM

## 2024-08-29 DIAGNOSIS — R001 Bradycardia, unspecified: Secondary | ICD-10-CM | POA: Diagnosis not present

## 2024-08-29 LAB — BASIC METABOLIC PANEL WITH GFR
Anion gap: 12 (ref 5–15)
BUN: 13 mg/dL (ref 6–20)
CO2: 25 mmol/L (ref 22–32)
Calcium: 8.8 mg/dL — ABNORMAL LOW (ref 8.9–10.3)
Chloride: 102 mmol/L (ref 98–111)
Creatinine, Ser: 0.63 mg/dL (ref 0.44–1.00)
GFR, Estimated: >60 mL/min (ref >60)
Glucose, Bld: 115 mg/dL — ABNORMAL HIGH (ref 70–99)
Potassium: 3.9 mmol/L (ref 3.5–5.1)
Sodium: 139 mmol/L (ref 135–145)

## 2024-08-29 LAB — TSH: TSH: 3.742 u[IU]/mL (ref 0.350–4.500)

## 2024-08-29 LAB — HIV ANTIBODY (ROUTINE TESTING W REFLEX): HIV Screen 4th Generation wRfx: NONREACTIVE

## 2024-08-29 LAB — MAGNESIUM: Magnesium: 2.1 mg/dL (ref 1.7–2.4)

## 2024-08-29 MED ORDER — METFORMIN HCL ER 500 MG PO TB24
1000.0000 mg | ORAL_TABLET | Freq: Every day | ORAL | 2 refills | Status: DC
  Filled 2024-08-29: qty 180, 90d supply, fill #0

## 2024-08-29 NOTE — TOC Transition Note (Signed)
 Transition of Care [facility]) - Discharge Note   Patient Details  Name: Debra Diaz MRN: [id number] Date of Birth: 08-10-1976  Transition of Care [facility]) CM/SW Contact:  Waddell Barnie Rama, RN Phone Number: 08/29/2024, 1:17 PM   Clinical Narrative:    For dc today, she has a friend that will be transporting her home.  TOC pharmacy to fill med.          Patient Goals and CMS Choice            Discharge Placement                       Discharge Plan and Services Additional resources added to the After Visit Summary for                                       Social Drivers of Health (SDOH) Interventions SDOH Screenings   Food Insecurity: Patient Declined (08/29/2024)  Housing: Patient Declined (08/29/2024)  Transportation Needs: No Transportation Needs (08/29/2024)  Utilities: Not At Risk (08/29/2024)  Depression (PHQ2-9): Low Risk  (08/11/2024)  Social Connections: Unknown (04/30/2022)   Received from [facility]  Tobacco Use: Medium Risk (08/28/2024)     Readmission Risk Interventions    08/29/2024    1:05 PM  Readmission Risk Prevention Plan  Post Dischage Appt Complete  Medication Screening Complete  Transportation Screening Complete

## 2024-08-29 NOTE — Discharge Summary (Addendum)
 Discharge Summary   Patient ID: Debra Diaz MRN: [id number]; DOB: 1976/03/26  Admit date: 08/28/2024 Discharge date: 08/29/2024  PCP:  Lupie Credit, DO   [facility] Providers Cardiologist:  Vina Gull, MD  Electrophysiologist:  OLE ONEIDA HOLTS, MD   Discharge Diagnoses  Principal Problem:   Bradycardia   Diagnostic Studies/Procedures   _____________   History of Present Illness   Debra Diaz is a 48 y.o. female with atrial flutter ablation 2023, hypertension, diabetes.  Patient with known history of sinus bradycardia with heart rates in the 50s.  Followed by EP with history of ablation.  Patient was transferred from [facility] with self-reported bradycardia in the 30s at home.  No falls or episodes of syncope. Already prescribed a zio monitor PTA for these complaints.    Hospital Course   Consultants:    Bradycardia Telemetry here has been benign.  She is in sinus bradycardia with heart rates in the 50s generally, at times can drop 30s to 40s.  No evidence of high degree blocks or pauses.  No falls or episodes of syncope. Avoid AV nodal blocking agents. Will discharge on 2-week monitor to ensure no other arrhythmias not depicted on telemetry here TSH normal.  Atrial flutter status post ablation 2023 Follows with EP.  No documented recurrences. Continue with Eliquis  5 mg twice daily  Hypertension Slightly elevated 140 systolic. Continue with hydrochlorothiazide  25 mg daily, losartan  100 mg. Needs blood pressure log and to bring to upcoming appointment  Diabetes A1c 7.7% 2 weeks ago.  Follow-up with PCP.  Refilling her metformin .  Patient seen and examined by Dr. Gull and deemed stable for discharge.  Metformin  will be sent to [facility] pharmacy here.  Monitor will be mailed to patient.  Follow-up will be arranged. Taking semaglutide  off of her list since she is not taking it.     Did the patient have an acute coronary syndrome  (MI, NSTEMI, STEMI, etc) this admission?:  No                               Did the patient have a percutaneous coronary intervention (stent / angioplasty)?:  No.          _____________  Discharge Vitals Blood pressure (!) 142/66, pulse (!) 41, temperature 97.8 F (36.6 C), temperature source Oral, resp. rate 19, weight 94.5 kg, SpO2 100%.  Filed Weights   08/29/24 0339  Weight: 94.5 kg    Labs & Radiologic Studies  CBC Recent Labs    08/28/24 0930  WBC 7.5  HGB 14.0  HCT 42.4  MCV 85.1  PLT 256   Basic Metabolic Panel Recent Labs    90/88/74 1111 08/28/24 1311 08/29/24 0229  NA 139  --  139  K 3.8  --  3.9  CL 103  --  102  CO2 21*  --  25  GLUCOSE 126*  --  115*  BUN 14  --  13  CREATININE 0.59  --  0.63  CALCIUM  9.3  --  8.8*  MG  --  2.0 2.1   Liver Function Tests Recent Labs    08/28/24 1111  AST 42*  ALT 46*  ALKPHOS 109  BILITOT 0.4  PROT 6.7  ALBUMIN  3.9   No results for input(s): LIPASE, AMYLASE in the last 72 hours. High Sensitivity Troponin:   No results for input(s): TROPONINIHS in the last 720 hours.  Recent Labs  Lab 08/28/24 1111 08/28/24 1311  TRNPT <15 <15    BNP Invalid input(s): POCBNP No results for input(s): PROBNP in the last 72 hours.  No results for input(s): BNP in the last 72 hours.  D-Dimer No results for input(s): DDIMER in the last 72 hours. Hemoglobin A1C No results for input(s): HGBA1C in the last 72 hours. Fasting Lipid Panel No results for input(s): CHOL, HDL, LDLCALC, TRIG, CHOLHDL, LDLDIRECT in the last 72 hours. No results found for: LIPOA  Thyroid  Function Tests Recent Labs    08/29/24 0229  TSH 3.742   _____________  DG Chest Portable 1 View Result Date: 08/28/2024 CLINICAL DATA:  48 year old female with chest pain, dizziness, fatigue, bradycardia. EXAM: PORTABLE CHEST 1 VIEW COMPARISON:  CTA chest 05/09/2023 and earlier. FINDINGS: Portable AP semi upright view at 0948  hours. Stable cardiac size and mediastinal contours. Chronic borderline to mild cardiomegaly. Visualized tracheal air column is within normal limits. Improved and fairly normal lung volumes. Allowing for portable technique the lungs are clear. No pneumothorax or pleural effusion. No acute osseous abnormality identified. Paucity of bowel gas in the upper abdomen. IMPRESSION: No acute cardiopulmonary abnormality. Electronically Signed   By: VEAR Hurst M.D.   On: 08/28/2024 10:18    Disposition Pt is being discharged home today in good condition.  Follow-up Plans & Appointments   Discharge Instructions     Amb referral to AFIB Clinic   Complete by: As directed        Discharge Medications Allergies as of 08/29/2024       Reactions   Cherry Itching, Rash        Medication List     STOP taking these medications    Semaglutide (0.25 or 0.5MG /DOS) 2 MG/3ML Sopn       TAKE these medications    Accu-Chek Guide w/Device Kit Use to check blood sugar 3 times per day   Accu-Chek Softclix Lancet Dev Kit Use to check blood sugar 3 times per day   Accu-Chek Softclix Lancets lancets Use to check blood sugar 3 times daily   acetaminophen  325 MG tablet Commonly known as: TYLENOL  Take 2 tablets (650 mg total) by mouth every 6 (six) hours as needed for fever.   Blood Pressure Kit Check blood pressure twice a day.  Dx code: I7   Eliquis  5 MG Tabs tablet Generic drug: apixaban  TAKE 1 TABLET(5 MG) BY MOUTH TWICE DAILY   glucose blood test strip Use as instructed   linaclotide  290 MCG Caps capsule Commonly known as: Linzess  Take 1 capsule (290 mcg total) by mouth daily before breakfast.   losartan -hydrochlorothiazide  100-25 MG tablet Commonly known as: HYZAAR TAKE 1 TABLET BY MOUTH DAILY   metFORMIN  500 MG 24 hr tablet Commonly known as: GLUCOPHAGE -XR Take 2 tablets (1,000 mg total) by mouth daily with breakfast.   WOMENS MULTIVITAMIN PO Take 1 tablet by mouth daily.          Outstanding Labs/Studies   Duration of Discharge Encounter: APP Time: 20 minutes   Signed, Thom LITTIE Sluder, PA-C 08/29/2024, 10:57 AM

## 2024-08-29 NOTE — Progress Notes (Signed)
 DISCHARGE NOTE HOME Debra Diaz to be discharged Home per MD order. Discussed prescriptions and follow up appointments with the patient. Prescriptions given to patient; medication list explained in detail. Patient verbalized understanding.  Skin clean, dry and intact without evidence of skin break down, no evidence of skin tears noted. IV catheter discontinued intact. Site without signs and symptoms of complications. Dressing and pressure applied. Pt denies pain at the site currently. No complaints noted.  Patient free of lines, drains, and wounds.   An After Visit Summary (AVS) was printed and given to the patient. Patient escorted via wheelchair, and discharged home via private auto.  Peyton [name], RN

## 2024-08-29 NOTE — Progress Notes (Unsigned)
 Enrolled patient for a 14 day Zio XT monitor to be mailed to patients home  Ross to read

## 2024-08-29 NOTE — Progress Notes (Signed)
 NMR ordered per Dr. Okey.

## 2024-08-29 NOTE — Telephone Encounter (Signed)
 2-week heart monitor for bradycardia please Dr. Okey to read.

## 2024-08-29 NOTE — Progress Notes (Signed)
 Rounding Note   Patient Name: Debra Diaz Date of Encounter: 08/29/2024  [facility] Cardiologist: Vina Gull, MD Remote;  Cindie  Subjective  Pt notes some mild chest tightness  Long lasting   Denies dizziness   NO SOB    Scheduled Meds:  apixaban   5 mg Oral BID   hydrochlorothiazide   25 mg Oral Daily   linaclotide   290 mcg Oral QAC breakfast   losartan   100 mg Oral Daily   metFORMIN   1,000 mg Oral Q breakfast   multivitamin with minerals  1 tablet Oral Daily   Continuous Infusions:  PRN Meds: acetaminophen , ondansetron  (ZOFRAN ) IV   Vital Signs  Vitals:   08/28/24 1830 08/28/24 2033 08/28/24 2342 08/29/24 0339  BP: 138/69 (!) 146/85 (!) 119/57 (!) 102/54  Pulse: (!) 40 (!) 44 (!) 37 (!) 37  Resp: 18 18 20 20   Temp:  98.5 F (36.9 C) 98.2 F (36.8 C) 97.8 F (36.6 C)  TempSrc:  Oral Oral Oral  SpO2: 98% 100% 98% 95%  Weight:    94.5 kg   No intake or output data in the 24 hours ending 08/29/24 0619    08/29/2024    3:39 AM 08/11/2024    2:21 PM 06/09/2024    3:18 PM  Last 3 Weights  Weight (lbs) 208 lb 5.4 oz 209 lb 211 lb 2 oz  Weight (kg) 94.5 kg 94.802 kg 95.766 kg      Telemetry SB  30s to 40s   - Personally Reviewed  ECG  No new  - Personally Reviewed  Physical Exam  GEN: No acute distress.   Neck: No JVD Cardiac: RRR, no murmur.  Respiratory: Clear to auscultation GI: Soft, nontender, non-distended  Ext  No edema  Labs High Sensitivity Troponin:  No results for input(s): TROPONINIHS in the last 720 hours.   Chemistry Recent Labs  Lab 08/28/24 1111 08/28/24 1311 08/29/24 0229  NA 139  --  139  K 3.8  --  3.9  CL 103  --  102  CO2 21*  --  25  GLUCOSE 126*  --  115*  BUN 14  --  13  CREATININE 0.59  --  0.63  CALCIUM  9.3  --  8.8*  MG  --  2.0 2.1  PROT 6.7  --   --   ALBUMIN  3.9  --   --   AST 42*  --   --   ALT 46*  --   --   ALKPHOS 109  --   --   BILITOT 0.4  --   --   GFRNONAA >60  --  >60   ANIONGAP 15  --  12    Lipids No results for input(s): CHOL, TRIG, HDL, LABVLDL, LDLCALC, CHOLHDL in the last 168 hours.  Hematology Recent Labs  Lab 08/28/24 0930  WBC 7.5  RBC 4.98  HGB 14.0  HCT 42.4  MCV 85.1  MCH 28.1  MCHC 33.0  RDW 13.6  PLT 256   Thyroid   Recent Labs  Lab 08/29/24 0229  TSH 3.742    BNPNo results for input(s): BNP, PROBNP in the last 168 hours.  DDimer No results for input(s): DDIMER in the last 168 hours.   Radiology  DG Chest Portable 1 View Result Date: 08/28/2024 CLINICAL DATA:  48 year old female with chest pain, dizziness, fatigue, bradycardia. EXAM: PORTABLE CHEST 1 VIEW COMPARISON:  CTA chest 05/09/2023 and earlier. FINDINGS: Portable AP semi upright view at [phone]  hours. Stable cardiac size and mediastinal contours. Chronic borderline to mild cardiomegaly. Visualized tracheal air column is within normal limits. Improved and fairly normal lung volumes. Allowing for portable technique the lungs are clear. No pneumothorax or pleural effusion. No acute osseous abnormality identified. Paucity of bowel gas in the upper abdomen. IMPRESSION: No acute cardiopulmonary abnormality. Electronically Signed   By: VEAR Hurst M.D.   On: 08/28/2024 10:18    Cardiac Studies    Echo  May 2024  1. Left ventricular ejection fraction, by estimation, is 60 to 65%. The  left ventricle has normal function. The left ventricle has no regional  wall motion abnormalities. Left ventricular diastolic parameters were  normal.   2. Right ventricular systolic function is normal. The right ventricular  size is normal. There is normal pulmonary artery systolic pressure.   3. The mitral valve is normal in structure. Mild mitral valve  regurgitation. No evidence of mitral stenosis.   4. Tricuspid valve regurgitation is mild to moderate.   5. The aortic valve is normal in structure. Aortic valve regurgitation is  not visualized. No aortic stenosis is present.    6. The inferior vena cava is normal in size with greater than 50%  respiratory variability, suggesting right atrial pressure of 3 mmHg.   Cardiac CT  01/2022  IMPRESSION: 1. There is normal pulmonary vein drainage into the left atrium with ostial measurements above.   2. There is no thrombus in the left atrial appendage.   3. The esophagus runs in the left atrial midline and is not in proximity to any of the pulmonary vein ostia.   4. No PFO/ASD.   5. Normal coronary origin. Right dominance.   6. CAC score of 155 which is 99 percentile for age-, race-, and sex-matched controls.    GXT  2022 Exercised 8 min 12 seconds to HR 126  Zio patch 2022   Average HR 49 bpm  LHC   2020  Normal   Patient Profile    Debra Diaz is a 48 y.o. female with prior aflutter ablation 2023, HTN and DM who is being seen 08/28/2024 for the evaluation of chest discomfort, fatigue, and self-reported bradycardia to 30s at home.   Assessment & Plan   1 Bradycardia   Pt with known bradycardia   Seen by PCP   She was concerned about low HR   PCP referred to ER.    Pt denies dizziness   Notes generalized fatigue but I am not convinced tied to activity     WOuld recomm ambulate.    Set up for Zio patch    Consider outpt GXT as done prevoiusly   At present no indication for PPM      2  Hx of atypical flutter   s/p Cardioversion Jan 2023 s/p ablation in March  2023.  Has been continued on Eliquis     3  HTN  BP is labile   Follow for now   NO hypotension   4  Hx atrial myxoma  (s/p excision in 2016)  5  T2DM  A1C in 7s   Pt admits to eating poorly    Discussed dietary changes with her   6  Lipids   Will need NMR panel as outpt  If ambulates and HR picks up some, not symptomatic   Then she is ok for d/c   I will make sure she has Zio patch and follow up in clinic     For  questions or updates, please contact [facility] Please consult [email or web address] for contact info  under   Signed, Vina Gull, MD  08/29/2024, 6:19 AM

## 2024-08-29 NOTE — Progress Notes (Signed)
 Per Dr Okey can have th Zio patch put on in the office

## 2024-08-29 NOTE — TOC CM/SW Note (Signed)
 Transition of Care [facility]) - Inpatient Brief Assessment   Patient Details  Name: Debra Diaz MRN: [id number] Date of Birth: 1976/11/06  [facility]) CM/SW Contact:    Waddell Barnie Rama, RN Phone Number: 08/29/2024, 1:06 PM   Clinical Narrative: From home with kids, has PCP and insurance on file, states has no HH services in place at this time or DME at home.  States friend will transport them home at [employer] and family is support system, states gets medications from [place] on [address]  and [place].  Pta self ambulatory.   There are no ICM (Inpatient Case Management)  needs identified  at this time.  Please place consult for  any ICM (Inpatient Case Management)  needs.     Transition of Care Asessment: Insurance and Status: Insurance coverage has been reviewed Patient has primary care physician: Yes Home environment has been reviewed: home with two kids Prior level of function:: indep Prior/Current Home Services: No current home services Social Drivers of Health Review: SDOH reviewed no interventions necessary Readmission risk has been reviewed: Yes Transition of care needs: no transition of care needs at this time

## 2024-09-05 ENCOUNTER — Encounter: Payer: Self-pay | Admitting: Family Medicine

## 2024-09-05 ENCOUNTER — Ambulatory Visit (INDEPENDENT_AMBULATORY_CARE_PROVIDER_SITE_OTHER): Admitting: Family Medicine

## 2024-09-05 VITALS — BP 121/81 | HR 45 | Ht 62.5 in | Wt 207.4 lb

## 2024-09-05 DIAGNOSIS — E1159 Type 2 diabetes mellitus with other circulatory complications: Secondary | ICD-10-CM | POA: Diagnosis not present

## 2024-09-05 DIAGNOSIS — R001 Bradycardia, unspecified: Secondary | ICD-10-CM | POA: Diagnosis present

## 2024-09-05 NOTE — Progress Notes (Signed)
    SUBJECTIVE:   CHIEF COMPLAINT / HPI:   Debra Diaz is a 48yo F that pf hospital f/u. In-person spanish interpretor was present.  - Was hospitalized 9/11 for symptomatic bradycardia. Was discharged w/ monitoring  - Reports feeling better since discharge. She has had some episodes of feeling fatigued but not nearly as bad as the episode that brought her to the ED in the first place. She has not been checking her HR, so she does not know her HR during these episodes. - She brings in her ziopatch. She was advised to wear this for 14 days.     PERTINENT  PMH / PSH: bradycardia,T2DM, Aflutter s/p ablation, excision of atrial mxyoma, HTN   OBJECTIVE:   BP 121/81   Pulse (!) 45   Ht 5' 2.5 (1.588 m)   Wt 207 lb 6.4 oz (94.1 kg)   SpO2 99%   BMI 37.33 kg/m   General: Alert, pleasant woman. NAD. HEENT: NCAT. MMM. CV: Slow rate, regula rrhythm.  Resp: CTAB, no wheezing or crackles. Normal WOB on RA.  Abm: Soft, nontender, nondistended. BS present. Ext: Moves all ext spontaneously. Normal gait.  Skin: Warm, well perfused   ASSESSMENT/PLAN:   Assessment & Plan Bradycardia Improving and stable but still symptomatic. Helped pt apply Zio patch and reviewed instructions.  - Ziopatch x14 days - f/u Cardiology recs - Counseled on return and ED precautions Type 2 diabetes mellitus with other circulatory complication, without long-term current use of insulin  (HCC) - lipid panel today     Debra Nearing, MD [facility]

## 2024-09-05 NOTE — Patient Instructions (Addendum)
 Me alegra verte hoy - Gracias por venir  Cosas que hablamos hoy:  Hoy revisaremos tu nivel de colesterol.  Usa  tu parche Zio durante [address] Presiona el botn cada vez que tengas sntomas o sientas que tu ritmo cardaco est bajo.  Despus de los [address] devulvelo por correo y tu cardilogo revisar los resultados.     Good to see you today - Thank you for coming in  Things we discussed today:  1) We will check your cholesterol level today  2) Wear your Ziopatch for 14 days. Press the button every time you feel symptoms or feel your heart rate is low.  - After 14 days, mail it back and your Cardiologist will review the results.

## 2024-09-06 LAB — LIPID PANEL
Chol/HDL Ratio: 3.6 ratio (ref 0.0–4.4)
Cholesterol, Total: 155 mg/dL (ref 100–199)
HDL: 43 mg/dL (ref >39)
LDL Chol Calc (NIH): 90 mg/dL (ref 0–99)
Triglycerides: 122 mg/dL (ref 0–149)
VLDL Cholesterol Cal: 22 mg/dL (ref 5–40)

## 2024-09-07 NOTE — Assessment & Plan Note (Addendum)
 Improving and stable but still symptomatic. Helped pt apply Zio patch and reviewed instructions.  - Ziopatch x14 days - f/u Cardiology recs - Counseled on return and ED precautions

## 2024-09-07 NOTE — Assessment & Plan Note (Signed)
-   lipid panel today

## 2024-09-08 ENCOUNTER — Ambulatory Visit (HOSPITAL_BASED_OUTPATIENT_CLINIC_OR_DEPARTMENT_OTHER): Payer: Self-pay | Admitting: Family Medicine

## 2024-09-12 DIAGNOSIS — R001 Bradycardia, unspecified: Secondary | ICD-10-CM

## 2024-09-26 ENCOUNTER — Telehealth: Payer: Self-pay | Admitting: Cardiology

## 2024-09-26 NOTE — Telephone Encounter (Signed)
 [facility] calling to report abnormal zio reading. Found symptomatic bradycardia on 09/06/24 at 6:44 AM. Page 5, strip 1 showed 34 bpm for 30 seconds. Will route to provider. Final report is available in the chart.

## 2024-09-26 NOTE — Telephone Encounter (Signed)
 Debra Diaz from [facility] called in to report abnormal readings

## 2024-09-26 NOTE — Telephone Encounter (Signed)
 Thank you for the update. It appears patient is currently scheduled to see Dr. Okey next week. Heart monitor personally reviewed, heart rate appears to be normal during time, primarily slow down between 8PM until 7AM. Although there was a slow recording around 7:13AM however unclear if the patient was awake at the time. Heart monitor has not been formally read by MD, will have Dr. Okey to review the recording with the patient during office visit.  Debra Diaz

## 2024-09-28 DIAGNOSIS — R001 Bradycardia, unspecified: Secondary | ICD-10-CM

## 2024-09-29 ENCOUNTER — Ambulatory Visit: Admitting: Student

## 2024-09-30 NOTE — Progress Notes (Unsigned)
 Cardiology Office Note   Date:  10/02/2024   ID:  Debra Diaz, DOB 10/17/1976, MRN [id number]  PCP:  Elicia Hamlet, MD  Cardiologist:   Vina Gull, MD   Pt presents for f/u of bradycardia    History of Present Illness: Debra Diaz is a 48 y.o. female with a history of atial myxoma( s/p excision in 2016),  atypical atrial flutter (s/p ablation), T2DM, HTN and  chest pain   2016  LHC 20% diffuse lesions in LAD; 30% D1  30% ramus 2016 Minimally invasive resection of L atrial myxoma 2017, Myovue normal 2020  LHC [facility]  Normal    2022  Echo   LVEF and RVEF normal   Mod LAE, Mild RAE   2022  Zio patch   SB/SR   Average HR 49 bpm    2022  GXT    Exercised 8 min 12 sec  HR got to 126 bpm  Peak BP 193/68    Jan 2023 Pt seen in ED for HTN   Found to be in atrial flutter  Placed on Eliquis     March 2023   Underwent atypical atrial flutter ablation        The pt was admitted to [facility] in Sept 2025 for chest discomfort, fatigue, dizziness  HR 30s at home    I saw the pt in consult    HR 30s to 40s   CP felt atypical Recomm outpt ZIo patch    28 to 117 bpm  Average HR 48 bpm   Longest pause 4.6 sec at around 5 am  Since seen the pt denies CP   Active   Cares for baby.  NO dizziness   Breathing is OK  WOrking on diet, we discussed in hospital   She has cut way back on carbs.   Sugars in 110s      Current Meds  Medication Sig   Accu-Chek Softclix Lancets lancets Use to check blood sugar 3 times daily   acetaminophen  (TYLENOL ) 325 MG tablet Take 2 tablets (650 mg total) by mouth every 6 (six) hours as needed for fever.   apixaban  (ELIQUIS ) 5 MG TABS tablet TAKE 1 TABLET(5 MG) BY MOUTH TWICE DAILY   Blood Glucose Monitoring Suppl (ACCU-CHEK GUIDE) w/Device KIT Use to check blood sugar 3 times per day   Blood Pressure KIT Check blood pressure twice a day.  Dx code: I10   glucose blood test strip Use as instructed   Lancets Misc. (ACCU-CHEK SOFTCLIX LANCET DEV)  KIT Use to check blood sugar 3 times per day   linaclotide  (LINZESS ) 290 MCG CAPS capsule Take 1 capsule (290 mcg total) by mouth daily before breakfast.   losartan -hydrochlorothiazide  (HYZAAR) 100-25 MG tablet TAKE 1 TABLET BY MOUTH DAILY   metFORMIN  (GLUCOPHAGE -XR) 500 MG 24 hr tablet Take 2 tablets (1,000 mg total) by mouth daily with breakfast.   Multiple Vitamins-Minerals (WOMENS MULTIVITAMIN PO) Take 1 tablet by mouth daily.   rosuvastatin (CRESTOR) 5 MG tablet Take 1 tablet (5 mg total) by mouth daily.     Allergies:   Cherry   Past Medical History:  Diagnosis Date   Amenorrhea 02/18/2021   Anemia    Asymptomatic microscopic hematuria 10/22/2020   Atrial flutter (HCC) 01/09/2022   Blood transfusion without reported diagnosis    Breast pain, right 10/03/2017   Chest pain 02/15/2020   Chest pain with high risk of acute coronary syndrome 02/27/2015   Concussion 05/05/2016   Diabetes  mellitus without complication (HCC)    Dizziness 04/22/2022   Elevated troponin I level 02/26/2015   Hyperlipidemia    Hypertension    Hypokalemia    Irregular menses 07/02/2017   Leukocytosis 10/03/2016   Morbid obesity (HCC)    Possible exposure to STD 02/11/2020   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   Screening for STD (sexually transmitted disease) 12/26/2021   Tubular adenoma of colon    Vaginal itching 12/26/2021    Past Surgical History:  Procedure Laterality Date   A-FLUTTER ABLATION N/A 02/16/2022   Procedure: A-FLUTTER ABLATION;  Surgeon: Cindie Ole DASEN, MD;  Location: [facility] INVASIVE CV LAB;  Service: Cardiovascular;  Laterality: N/A;   CESAREAN SECTION  1999 and 2011   x 2   CHOLECYSTECTOMY N/A 05/13/2023   Procedure: LAPAROSCOPIC CHOLECYSTECTOMY;  Surgeon: Kinsinger, Herlene Righter, MD;  Location: [facility] OR;  Service: General;  Laterality: N/A;   LEFT HEART CATHETERIZATION WITH CORONARY ANGIOGRAM N/A 03/02/2015   Procedure: LEFT HEART CATHETERIZATION WITH  CORONARY ANGIOGRAM;  Surgeon: Alm LELON Clay, MD;  Location: [facility] CATH LAB;  Service: Cardiovascular;  Laterality: N/A;   MINIMALLY INVASIVE EXCISION OF ATRIAL MYXOMA N/A 03/04/2015   Procedure: MINIMALLY INVASIVE RESECTION OF LEFT ATRIAL MYXOMA ( USING A BILAYER PATCH CLOSURE);  Surgeon: Sudie VEAR Laine, MD;  Location: [facility] OR;  Service: Open Heart Surgery;  Laterality: N/A;   myxoma N/A    chest   TEE WITHOUT CARDIOVERSION N/A 03/01/2015   Procedure: TRANSESOPHAGEAL ECHOCARDIOGRAM (TEE);  Surgeon: Redell GORMAN Shallow, MD;  Location: [facility] ENDOSCOPY;  Service: Cardiovascular;  Laterality: N/A;   TEE WITHOUT CARDIOVERSION N/A 03/04/2015   Procedure: TRANSESOPHAGEAL ECHOCARDIOGRAM (TEE);  Surgeon: Sudie VEAR Laine, MD;  Location: [facility] OR;  Service: Open Heart Surgery;  Laterality: N/A;   TUBAL LIGATION  2011     Social History:  The patient  reports that she quit smoking about 6 years ago. Her smoking use included cigarettes. She has been exposed to tobacco smoke. She has never used smokeless tobacco. She reports current alcohol use. She reports that she does not use drugs.   Family History:  The patient's family history includes Asthma in her son; Breast cancer in her maternal grandmother; Diabetes in her father; Heart disease in her mother; Hypertension in her mother; Stomach cancer in her maternal grandfather.    ROS:  Please see the history of present illness. All other systems are reviewed and  Negative to the above problem except as noted.    PHYSICAL EXAM: VS:  BP 139/65 (BP Location: Left Arm, Patient Position: Sitting)   Pulse (!) 44   Ht 5' 3 (1.6 m)   Wt 205 lb 9.6 oz (93.3 kg)   SpO2 98%   BMI 36.42 kg/m   GEN:  Obese 48  yo in no acute distress  HEENT: normal  Neck: no JVD, no carotid bruits Cardiac: RRR; no murmur Respiratory:  clear to auscultation  GI: soft, nontender, No hepatomegaly  Ext  No LE edema  EKG:  EKG is not ordered   CARDIAC STUDIES  Relevant CV Studies:  2024   Echo       1. Left ventricular ejection fraction, by estimation, is 60 to 65%. The  left ventricle has normal function. The left ventricle has no regional  wall motion abnormalities. Left ventricular diastolic parameters were  normal.   2. Right ventricular systolic function is normal. The right ventricular  size is normal. There is normal pulmonary  artery systolic pressure.   3. The mitral valve is normal in structure. Mild mitral valve  regurgitation. No evidence of mitral stenosis.   4. Tricuspid valve regurgitation is mild to moderate.   5. The aortic valve is normal in structure. Aortic valve regurgitation is  not visualized. No aortic stenosis is present.   6. The inferior vena cava is normal in size with greater than 50%  respiratory variability, suggesting right atrial pressure of 3 mmHg.       CATH Sept 2020 [facility]   Findings:  1. Hemodynamics: Aortic pressure 125/75, LVEDP 12 2. Coronary system:  Left Main: Large caliber vessel with no angiographic evidence of  stenosis.  LAD system: Large caliber vessel, wraps around the apex. NO significant  stenosis  LCX system: Large caliber vessel.  No significant stenosis.  RCA system: right dominant. No significant stenosis.  Conclusion: Normal coronaries CAD as mentioned above. Normal LVEDP  Recommendations: Continue risk factor modifications   MLipid Panel    Component Value Date/Time   CHOL 155 09/05/2024 1423   TRIG 122 09/05/2024 1423   HDL 43 09/05/2024 1423   CHOLHDL 3.6 09/05/2024 1423   CHOLHDL 4.0 09/01/2016 1607   VLDL 42 (H) 09/01/2016 1607   LDLCALC 90 09/05/2024 1423   LDLDIRECT 97 09/27/2021 1436      Wt Readings from Last 3 Encounters:  10/02/24 205 lb 9.6 oz (93.3 kg)  09/05/24 207 lb 6.4 oz (94.1 kg)  08/29/24 208 lb 5.4 oz (94.5 kg)      ASSESSMENT AND PLAN:   1 Bradycardia    Pt admitted with bradycardia and chest tightness    Monitors since d/c showed SB/SR with average HR around 48  bpm   She denies symptoms   Active     Would continue to follow   No indication for pacer  2  Hx of atypical atrial flutter   s/p ablation.    Keep on ELiquis    3  Hx chest pressure   Has had several ischemic evals   Negative    Denies CP  4   Hx myxoma    s/p resection in 2016   Echo 2024 without recurrance  5  HL  LDL in September 90  HDL 43  Trig  1212   With minimal plaquing and DM will start Crestor 5 mg   Follow up lNMR panel in 8 wks with Lpa  7  HTN  BP is a little high here  She says it is 120s at home   Follow esp with wt loss  8  DM   Last A1C 7.7   We had long discussion in hospital about DM and diet    She has made some big changes in diet   Sugars bettter     Will check A1C in Jan 2026 A6  HTN   Continue current meds   Current medicines are reviewed at length with the patient today.  The patient does not have concerns regarding medicines.  Signed, Vina Gull, MD  10/02/2024 10:02 PM    [facility] [address] [place], [place], [place]  [address] Phone: [phone]; Fax: [phone]

## 2024-10-02 ENCOUNTER — Ambulatory Visit: Attending: Internal Medicine | Admitting: Internal Medicine

## 2024-10-02 ENCOUNTER — Encounter: Payer: Self-pay | Admitting: Internal Medicine

## 2024-10-02 VITALS — BP 139/65 | HR 44 | Ht 63.0 in | Wt 205.6 lb

## 2024-10-02 DIAGNOSIS — E1169 Type 2 diabetes mellitus with other specified complication: Secondary | ICD-10-CM | POA: Insufficient documentation

## 2024-10-02 DIAGNOSIS — E785 Hyperlipidemia, unspecified: Secondary | ICD-10-CM | POA: Insufficient documentation

## 2024-10-02 MED ORDER — ROSUVASTATIN CALCIUM 5 MG PO TABS: 5.0000 mg | ORAL_TABLET | Freq: Every day | ORAL | 3 refills | Status: DC

## 2024-10-02 NOTE — Patient Instructions (Signed)
 Medication Instructions:  Your physician has recommended you make the following change in your medication:  1) START rosuvastatin (Crestor) 5 mg daily  *If you need a refill on your cardiac medications before your next appointment, please call your pharmacy*  Lab Work: Early January 2026: fasting NMR, LP(a), hemoglobin A1c  You may go to any of these [facility] locations: [facility] - [address] ([facility]) - [address] [phone] N. [address] (1st floor, next to pharmacy)   If you have labs (blood work) drawn today and your tests are completely normal, you will receive your results only by: [employer] (if you have MyChart) OR A paper copy in the mail If you have any lab test that is abnormal or we need to change your treatment, we will call you to review the results.  Follow-Up: At [facility], you and your health needs are our priority.  As part of our continuing mission to provide you with exceptional heart care, our providers are all part of one team.  This team includes your primary Cardiologist (physician) and Advanced Practice Providers or APPs (Physician Assistants and Nurse Practitioners) who all work together to provide you with the care you need, when you need it.  Your next appointment:   4 month(s)  Provider:   Vina Gull, MD   We recommend signing up for the patient portal called MyChart.  Sign up information is provided on this After Visit Summary.  MyChart is used to connect with patients for Virtual Visits (Telemedicine).  Patients are able to view lab/test results, encounter notes, upcoming appointments, etc.  Non-urgent messages can be sent to your provider as well.    To learn more about what you can do with MyChart, go to [email or web address].

## 2024-10-14 ENCOUNTER — Other Ambulatory Visit: Payer: Self-pay | Admitting: Family Medicine

## 2024-10-14 DIAGNOSIS — E1159 Type 2 diabetes mellitus with other circulatory complications: Secondary | ICD-10-CM

## 2024-10-16 ENCOUNTER — Ambulatory Visit: Admitting: Internal Medicine

## 2024-11-10 ENCOUNTER — Other Ambulatory Visit: Payer: Self-pay | Admitting: Family Medicine

## 2024-11-10 DIAGNOSIS — Z1231 Encounter for screening mammogram for malignant neoplasm of breast: Secondary | ICD-10-CM

## 2024-12-09 ENCOUNTER — Ambulatory Visit
Admission: RE | Admit: 2024-12-09 | Discharge: 2024-12-09 | Disposition: A | Discharge status: Home / Self Care | Source: Ambulatory Visit | Attending: Family Medicine | Admitting: Family Medicine

## 2024-12-09 DIAGNOSIS — Z1231 Encounter for screening mammogram for malignant neoplasm of breast: Secondary | ICD-10-CM

## 2024-12-23 ENCOUNTER — Encounter: Payer: Self-pay | Admitting: Family Medicine

## 2024-12-23 ENCOUNTER — Other Ambulatory Visit: Payer: Self-pay | Admitting: Family Medicine

## 2024-12-23 DIAGNOSIS — N644 Mastodynia: Secondary | ICD-10-CM

## 2025-01-09 ENCOUNTER — Encounter: Payer: Self-pay | Admitting: Family Medicine

## 2025-01-09 ENCOUNTER — Other Ambulatory Visit: Payer: Self-pay

## 2025-01-09 ENCOUNTER — Ambulatory Visit: Admitting: Family Medicine

## 2025-01-09 VITALS — BP 118/66 | HR 53 | Wt 205.0 lb

## 2025-01-09 DIAGNOSIS — E785 Hyperlipidemia, unspecified: Secondary | ICD-10-CM | POA: Diagnosis not present

## 2025-01-09 DIAGNOSIS — E1159 Type 2 diabetes mellitus with other circulatory complications: Secondary | ICD-10-CM | POA: Diagnosis present

## 2025-01-09 DIAGNOSIS — E1169 Type 2 diabetes mellitus with other specified complication: Secondary | ICD-10-CM

## 2025-01-09 DIAGNOSIS — R001 Bradycardia, unspecified: Secondary | ICD-10-CM | POA: Diagnosis not present

## 2025-01-09 DIAGNOSIS — M6530 Trigger finger, unspecified finger: Secondary | ICD-10-CM | POA: Diagnosis not present

## 2025-01-09 DIAGNOSIS — I10 Essential (primary) hypertension: Secondary | ICD-10-CM | POA: Diagnosis not present

## 2025-01-09 LAB — POCT GLYCOSYLATED HEMOGLOBIN (HGB A1C): HbA1c, POC (controlled diabetic range): 7.1 % — AB (ref 0.0–7.0)

## 2025-01-09 MED ORDER — APIXABAN 5 MG PO TABS: 5.0000 mg | ORAL_TABLET | Freq: Two times a day (BID) | ORAL | 5 refills | Status: AC

## 2025-01-09 MED ORDER — METFORMIN HCL ER 500 MG PO TB24: 1000.0000 mg | ORAL_TABLET | Freq: Every day | ORAL | 2 refills | Status: AC

## 2025-01-09 MED ORDER — ROSUVASTATIN CALCIUM 5 MG PO TABS: 5.0000 mg | ORAL_TABLET | Freq: Every day | ORAL | 3 refills | Status: AC

## 2025-01-09 MED ORDER — LOSARTAN POTASSIUM-HCTZ 100-25 MG PO TABS: 1.0000 | ORAL_TABLET | Freq: Every day | ORAL | 2 refills | Status: AC

## 2025-01-09 MED ORDER — SEMAGLUTIDE-WEIGHT MANAGEMENT 0.25 MG/0.5ML ~~LOC~~ SOAJ: 0.2500 mg | SUBCUTANEOUS | 1 refills | Status: DC

## 2025-01-09 MED ORDER — SEMAGLUTIDE(0.25 OR 0.5MG/DOS) 2 MG/1.5ML ~~LOC~~ SOPN: 0.2500 mg | PEN_INJECTOR | SUBCUTANEOUS | 1 refills | Status: AC

## 2025-01-09 NOTE — Patient Instructions (Addendum)
 Thank you for visiting clinic today and allowing us  to participate in your care!  Your blood pressure and A1c (blood sugar) are in a good spot today! Please continue taking all your medications as prescribed. We discussed starting Wegovy  today - please administer as prescribed and return to clinic in 1 month for follow up.   We also recommend seeing the eye doctor every year for a diabetic eye exam. A referral has been placed.   For your trigger finger symptoms, please try to rest and stretch as you're able. You may use Tylenol  as needed for pain relief. You can try a brace or glove like below. We have also referred you to Sports Medicine for further management.  For any referrals placed today, someone will reach out in the next couple weeks to schedule. Let us  know if you do not hear from someone.   Please schedule an appointment in 1 month.  Reach out any time with any questions or concerns you may have - we are here for you!  Damien Cassis, MD [facility] [phone]

## 2025-01-09 NOTE — Assessment & Plan Note (Signed)
 Follows with Cardiology. BP at goal today. HR around baseline and patient asymptomatic. Continue losartan -hydrochlorothiazide  100-25 daily.

## 2025-01-09 NOTE — Assessment & Plan Note (Addendum)
 A1c well-controlled today. Continue current regimen as above. Patient has been making healthy lifestyle changes including increased exercise and healthy diet over the past several months. We discussed starting injectable GLP-1 for further diabetes management and weight loss. Per chart review, patient with PA approval for Ozempic . Ozempic  0.25 mg/week rx sent. Also discussed and placed referral to ophthalmology for annual diabetic eye exam. Patient UTD on BMP, uACR, and lipid panel. Continue rosuvastatin  5 daily and losartan -hydrochlorothiazide  100-25 daily.

## 2025-01-09 NOTE — Addendum Note (Signed)
 Addended by: DIONA PERKINS on: 01/09/2025 04:42 PM   Modules accepted: Orders

## 2025-01-09 NOTE — Progress Notes (Addendum)
" ° ° °  SUBJECTIVE:   CHIEF COMPLAINT / HPI:   T2DM Current regimen: metformin  1000 daily  Making positive exercise and diet changes  Interested in GLP-1  HTN Bradycardia  Current regimen: losartan -hydrochlorothiazide  100-25 daily  No recent chest pain No recent significant dizziness or lightheadedness No LOC   Trigger finger Reports L hand 3rd and 4th digit often lock up at night  Painful, difficult to straighten when episodes occur Works in maintenance/cleaning  No recent injury No pain at rest Interested in injections  PERTINENT  PMH / PSH: T2DM, HTN, Sinus bradycardia, hx myxoma s/p removal  OBJECTIVE:   BP 118/66   Pulse (!) 53   Wt 205 lb (93 kg)   SpO2 98%   BMI 36.31 kg/m   General: Well-appearing. Resting comfortably in room. CV: Normal S1/S2. No extra heart sounds. Warm and well-perfused. Pulm: Breathing comfortably on room air. CTAB. No increased WOB. Abd: Soft, non-tender, non-distended. Skin:  Warm, dry. Ext: No extremity swelling. Nontender to palpation of joints throughout bilateral fingers. Bilateral hands with no erythema, swelling, or skin breakdown. Normal ROM of bilateral hands.  Psych: Pleasant and appropriate.    ASSESSMENT/PLAN:   Assessment & Plan Type 2 diabetes mellitus with other circulatory complication, without long-term current use of insulin  (HCC) A1c well-controlled today. Continue current regimen as above. Patient has been making healthy lifestyle changes including increased exercise and healthy diet over the past several months. We discussed starting injectable GLP-1 for further diabetes management and weight loss. Per chart review, patient with PA approval for Ozempic . Ozempic  0.25 mg/week rx sent. Also discussed and placed referral to ophthalmology for annual diabetic eye exam. Patient UTD on BMP, uACR, and lipid panel. Continue rosuvastatin  5 daily and losartan -hydrochlorothiazide  100-25 daily.   Trigger finger of left hand,  unspecified finger Presentation concerning for trigger finger of L hand 3rd and 4th digit. Supportive measures discussed. Also discussed and placed referral to Sports Medicine for further management.  Primary hypertension Bradycardia Follows with Cardiology. BP at goal today. HR around baseline and patient asymptomatic. Continue losartan -hydrochlorothiazide  100-25 daily.    RTC in 1 month for Wegovy  follow up.   Damien Cassis, MD [facility]  "

## 2025-01-14 ENCOUNTER — Other Ambulatory Visit: Payer: Self-pay | Admitting: Family Medicine

## 2025-01-14 DIAGNOSIS — K5900 Constipation, unspecified: Secondary | ICD-10-CM

## 2025-01-15 ENCOUNTER — Encounter

## 2025-01-15 ENCOUNTER — Other Ambulatory Visit

## 2025-01-22 ENCOUNTER — Other Ambulatory Visit (HOSPITAL_COMMUNITY): Payer: Self-pay

## 2025-01-23 ENCOUNTER — Ambulatory Visit

## 2025-01-29 ENCOUNTER — Ambulatory Visit

## 2025-02-04 ENCOUNTER — Encounter

## 2025-02-04 ENCOUNTER — Other Ambulatory Visit

## 2025-02-05 ENCOUNTER — Ambulatory Visit: Admitting: Internal Medicine

## 2025-02-10 ENCOUNTER — Ambulatory Visit: Payer: Self-pay | Admitting: Family Medicine
